# Patient Record
Sex: Male | Born: 1965 | Race: White | Hispanic: No | Smoking: Current every day smoker
Health system: Southern US, Community
[De-identification: ages and names within clinical notes are randomized; demographics above are authoritative.]

## PROBLEM LIST (undated history)

## (undated) DIAGNOSIS — I10 Essential (primary) hypertension: Secondary | ICD-10-CM

## (undated) DIAGNOSIS — E119 Type 2 diabetes mellitus without complications: Secondary | ICD-10-CM

## (undated) DIAGNOSIS — I509 Heart failure, unspecified: Secondary | ICD-10-CM

## (undated) DIAGNOSIS — J449 Chronic obstructive pulmonary disease, unspecified: Secondary | ICD-10-CM

## (undated) DIAGNOSIS — R0600 Dyspnea, unspecified: Secondary | ICD-10-CM

## (undated) HISTORY — PX: NO PAST SURGERIES: SHX2092

---

## 2005-04-28 ENCOUNTER — Ambulatory Visit: Payer: Self-pay | Admitting: Infectious Diseases

## 2005-04-28 ENCOUNTER — Inpatient Hospital Stay (HOSPITAL_COMMUNITY): Admission: EM | Admit: 2005-04-28 | Discharge: 2005-05-01 | Payer: Self-pay | Admitting: Emergency Medicine

## 2005-05-02 ENCOUNTER — Encounter (HOSPITAL_COMMUNITY): Admission: RE | Admit: 2005-05-02 | Discharge: 2005-06-14 | Payer: Self-pay | Admitting: Infectious Diseases

## 2005-05-23 ENCOUNTER — Ambulatory Visit: Payer: Self-pay | Admitting: Infectious Diseases

## 2005-05-25 ENCOUNTER — Ambulatory Visit (HOSPITAL_COMMUNITY): Admission: RE | Admit: 2005-05-25 | Discharge: 2005-05-25 | Payer: Self-pay | Admitting: Neurosurgery

## 2021-03-12 ENCOUNTER — Emergency Department (HOSPITAL_COMMUNITY): Payer: Medicaid Other

## 2021-03-12 ENCOUNTER — Other Ambulatory Visit: Payer: Self-pay

## 2021-03-12 ENCOUNTER — Encounter (HOSPITAL_COMMUNITY): Payer: Self-pay | Admitting: *Deleted

## 2021-03-12 ENCOUNTER — Inpatient Hospital Stay (HOSPITAL_COMMUNITY)
Admission: EM | Admit: 2021-03-12 | Discharge: 2021-03-18 | DRG: 871 | Disposition: A | Discharge status: Home / Self Care | Payer: Medicaid Other | Attending: Family Medicine | Admitting: Family Medicine

## 2021-03-12 DIAGNOSIS — T380X5A Adverse effect of glucocorticoids and synthetic analogues, initial encounter: Secondary | ICD-10-CM | POA: Diagnosis present

## 2021-03-12 DIAGNOSIS — I110 Hypertensive heart disease with heart failure: Secondary | ICD-10-CM | POA: Diagnosis present

## 2021-03-12 DIAGNOSIS — E11649 Type 2 diabetes mellitus with hypoglycemia without coma: Secondary | ICD-10-CM | POA: Diagnosis not present

## 2021-03-12 DIAGNOSIS — R7989 Other specified abnormal findings of blood chemistry: Secondary | ICD-10-CM | POA: Diagnosis present

## 2021-03-12 DIAGNOSIS — E119 Type 2 diabetes mellitus without complications: Secondary | ICD-10-CM

## 2021-03-12 DIAGNOSIS — Z880 Allergy status to penicillin: Secondary | ICD-10-CM

## 2021-03-12 DIAGNOSIS — J189 Pneumonia, unspecified organism: Secondary | ICD-10-CM

## 2021-03-12 DIAGNOSIS — F17210 Nicotine dependence, cigarettes, uncomplicated: Secondary | ICD-10-CM | POA: Diagnosis present

## 2021-03-12 DIAGNOSIS — Z79899 Other long term (current) drug therapy: Secondary | ICD-10-CM

## 2021-03-12 DIAGNOSIS — I10 Essential (primary) hypertension: Secondary | ICD-10-CM

## 2021-03-12 DIAGNOSIS — J440 Chronic obstructive pulmonary disease with acute lower respiratory infection: Secondary | ICD-10-CM | POA: Diagnosis present

## 2021-03-12 DIAGNOSIS — Z886 Allergy status to analgesic agent status: Secondary | ICD-10-CM

## 2021-03-12 DIAGNOSIS — N179 Acute kidney failure, unspecified: Secondary | ICD-10-CM | POA: Diagnosis present

## 2021-03-12 DIAGNOSIS — Z20822 Contact with and (suspected) exposure to covid-19: Secondary | ICD-10-CM | POA: Diagnosis present

## 2021-03-12 DIAGNOSIS — E1165 Type 2 diabetes mellitus with hyperglycemia: Secondary | ICD-10-CM | POA: Diagnosis present

## 2021-03-12 DIAGNOSIS — J9621 Acute and chronic respiratory failure with hypoxia: Secondary | ICD-10-CM | POA: Diagnosis present

## 2021-03-12 DIAGNOSIS — J441 Chronic obstructive pulmonary disease with (acute) exacerbation: Secondary | ICD-10-CM

## 2021-03-12 DIAGNOSIS — G4733 Obstructive sleep apnea (adult) (pediatric): Secondary | ICD-10-CM | POA: Diagnosis present

## 2021-03-12 DIAGNOSIS — R6520 Severe sepsis without septic shock: Secondary | ICD-10-CM | POA: Diagnosis present

## 2021-03-12 DIAGNOSIS — E8729 Other acidosis: Secondary | ICD-10-CM

## 2021-03-12 DIAGNOSIS — I5033 Acute on chronic diastolic (congestive) heart failure: Secondary | ICD-10-CM | POA: Diagnosis not present

## 2021-03-12 DIAGNOSIS — E872 Acidosis: Secondary | ICD-10-CM | POA: Diagnosis present

## 2021-03-12 DIAGNOSIS — E782 Mixed hyperlipidemia: Secondary | ICD-10-CM | POA: Diagnosis present

## 2021-03-12 DIAGNOSIS — J9691 Respiratory failure, unspecified with hypoxia: Secondary | ICD-10-CM

## 2021-03-12 DIAGNOSIS — J9622 Acute and chronic respiratory failure with hypercapnia: Secondary | ICD-10-CM | POA: Diagnosis present

## 2021-03-12 DIAGNOSIS — Z7984 Long term (current) use of oral hypoglycemic drugs: Secondary | ICD-10-CM

## 2021-03-12 DIAGNOSIS — R0602 Shortness of breath: Secondary | ICD-10-CM | POA: Diagnosis not present

## 2021-03-12 DIAGNOSIS — A419 Sepsis, unspecified organism: Secondary | ICD-10-CM | POA: Diagnosis present

## 2021-03-12 HISTORY — DX: Essential (primary) hypertension: I10

## 2021-03-12 HISTORY — DX: Chronic obstructive pulmonary disease, unspecified: J44.9

## 2021-03-12 LAB — CBC WITH DIFFERENTIAL/PLATELET
Abs Immature Granulocytes: 0.11 10*3/uL — ABNORMAL HIGH (ref 0.00–0.07)
Abs Immature Granulocytes: 0.1 10*3/uL — ABNORMAL HIGH (ref 0.00–0.07)
Basophils Absolute: 0.1 10*3/uL (ref 0.0–0.1)
Basophils Absolute: 0.1 10*3/uL (ref 0.0–0.1)
Basophils Relative: 1 %
Basophils Relative: 0 %
Eosinophils Absolute: 0 10*3/uL (ref 0.0–0.5)
Eosinophils Absolute: 0 10*3/uL (ref 0.0–0.5)
Eosinophils Relative: 0 %
Eosinophils Relative: 0 %
HCT: 59 % — ABNORMAL HIGH (ref 39.0–52.0)
HCT: 59.5 % — ABNORMAL HIGH (ref 39.0–52.0)
Hemoglobin: 19.3 g/dL — ABNORMAL HIGH (ref 13.0–17.0)
Hemoglobin: 18.9 g/dL — ABNORMAL HIGH (ref 13.0–17.0)
Immature Granulocytes: 1 %
Immature Granulocytes: 1 %
Lymphocytes Relative: 4 %
Lymphocytes Relative: 7 %
Lymphs Abs: 0.7 10*3/uL (ref 0.7–4.0)
Lymphs Abs: 1.1 10*3/uL (ref 0.7–4.0)
MCH: 32.4 pg (ref 26.0–34.0)
MCH: 32.1 pg (ref 26.0–34.0)
MCHC: 32.7 g/dL (ref 30.0–36.0)
MCHC: 31.8 g/dL (ref 30.0–36.0)
MCV: 99.2 fL (ref 80.0–100.0)
MCV: 101 fL — ABNORMAL HIGH (ref 80.0–100.0)
Monocytes Absolute: 1.9 10*3/uL — ABNORMAL HIGH (ref 0.1–1.0)
Monocytes Absolute: 1.7 10*3/uL — ABNORMAL HIGH (ref 0.1–1.0)
Monocytes Relative: 11 %
Monocytes Relative: 11 %
Neutro Abs: 13.8 10*3/uL — ABNORMAL HIGH (ref 1.7–7.7)
Neutro Abs: 12.8 10*3/uL — ABNORMAL HIGH (ref 1.7–7.7)
Neutrophils Relative %: 83 %
Neutrophils Relative %: 81 %
Platelets: 151 10*3/uL (ref 150–400)
Platelets: 148 10*3/uL — ABNORMAL LOW (ref 150–400)
RBC: 5.95 MIL/uL — ABNORMAL HIGH (ref 4.22–5.81)
RBC: 5.89 MIL/uL — ABNORMAL HIGH (ref 4.22–5.81)
RDW: 18.6 % — ABNORMAL HIGH (ref 11.5–15.5)
RDW: 18.7 % — ABNORMAL HIGH (ref 11.5–15.5)
WBC: 16.6 10*3/uL — ABNORMAL HIGH (ref 4.0–10.5)
WBC: 15.7 10*3/uL — ABNORMAL HIGH (ref 4.0–10.5)
nRBC: 0 % (ref 0.0–0.2)
nRBC: 0 % (ref 0.0–0.2)

## 2021-03-12 LAB — URINALYSIS, ROUTINE W REFLEX MICROSCOPIC
Bacteria, UA: NONE SEEN
Bilirubin Urine: NEGATIVE
Glucose, UA: NEGATIVE mg/dL
Hgb urine dipstick: NEGATIVE
Ketones, ur: NEGATIVE mg/dL
Leukocytes,Ua: NEGATIVE
Nitrite: NEGATIVE
Protein, ur: 100 mg/dL — AB
Specific Gravity, Urine: 1.024 (ref 1.005–1.030)
pH: 5 (ref 5.0–8.0)

## 2021-03-12 LAB — RESP PANEL BY RT-PCR (FLU A&B, COVID) ARPGX2
Influenza A by PCR: NEGATIVE
Influenza B by PCR: NEGATIVE
SARS Coronavirus 2 by RT PCR: NEGATIVE

## 2021-03-12 LAB — COMPREHENSIVE METABOLIC PANEL
ALT: 15 U/L (ref 0–44)
AST: 14 U/L — ABNORMAL LOW (ref 15–41)
Albumin: 4.3 g/dL (ref 3.5–5.0)
Alkaline Phosphatase: 58 U/L (ref 38–126)
Anion gap: 12 (ref 5–15)
BUN: 54 mg/dL — ABNORMAL HIGH (ref 6–20)
CO2: 27 mmol/L (ref 22–32)
Calcium: 9.2 mg/dL (ref 8.9–10.3)
Chloride: 95 mmol/L — ABNORMAL LOW (ref 98–111)
Creatinine, Ser: 1.83 mg/dL — ABNORMAL HIGH (ref 0.61–1.24)
GFR, Estimated: 43 mL/min — ABNORMAL LOW (ref >60)
Glucose, Bld: 126 mg/dL — ABNORMAL HIGH (ref 70–99)
Potassium: 4.3 mmol/L (ref 3.5–5.1)
Sodium: 134 mmol/L — ABNORMAL LOW (ref 135–145)
Total Bilirubin: 2.4 mg/dL — ABNORMAL HIGH (ref 0.3–1.2)
Total Protein: 8.2 g/dL — ABNORMAL HIGH (ref 6.5–8.1)

## 2021-03-12 LAB — BLOOD GAS, ARTERIAL
Acid-base deficit: 2 mmol/L (ref 0.0–2.0)
Allens test (pass/fail): POSITIVE — AB
Bicarbonate: 28.6 mmol/L — ABNORMAL HIGH (ref 20.0–28.0)
O2 Saturation: 88 %
Patient temperature: 98.6
pCO2 arterial: 72.5 mmHg (ref 32.0–48.0)
pH, Arterial: 7.22 — ABNORMAL LOW (ref 7.350–7.450)
pO2, Arterial: 62.7 mmHg — ABNORMAL LOW (ref 83.0–108.0)

## 2021-03-12 LAB — PROTIME-INR
INR: 1.2 (ref 0.8–1.2)
Prothrombin Time: 15 seconds (ref 11.4–15.2)

## 2021-03-12 LAB — BRAIN NATRIURETIC PEPTIDE: B Natriuretic Peptide: 132.9 pg/mL — ABNORMAL HIGH (ref 0.0–100.0)

## 2021-03-12 LAB — APTT: aPTT: 31 seconds (ref 24–36)

## 2021-03-12 LAB — LACTIC ACID, PLASMA: Lactic Acid, Venous: 1 mmol/L (ref 0.5–1.9)

## 2021-03-12 LAB — D-DIMER, QUANTITATIVE: D-Dimer, Quant: 0.97 ug/mL-FEU — ABNORMAL HIGH (ref 0.00–0.50)

## 2021-03-12 MED ORDER — ONDANSETRON HCL 4 MG/2ML IJ SOLN: 4.0000 mg | Freq: Four times a day (QID) | INTRAMUSCULAR | Status: DC | PRN

## 2021-03-12 MED ORDER — IPRATROPIUM-ALBUTEROL 0.5-2.5 (3) MG/3ML IN SOLN
3.0000 mL | Freq: Once | RESPIRATORY_TRACT | Status: AC
  Administered 2021-03-12: 3 mL via RESPIRATORY_TRACT
  Filled 2021-03-12: qty 3

## 2021-03-12 MED ORDER — SODIUM CHLORIDE 0.9 % IV SOLN: INTRAVENOUS | Status: DC

## 2021-03-12 MED ORDER — ENOXAPARIN SODIUM 40 MG/0.4ML IJ SOSY
40.0000 mg | PREFILLED_SYRINGE | INTRAMUSCULAR | Status: DC
  Administered 2021-03-12: 40 mg via SUBCUTANEOUS
  Filled 2021-03-12: qty 0.4

## 2021-03-12 MED ORDER — TRAZODONE HCL 50 MG PO TABS
50.0000 mg | ORAL_TABLET | Freq: Once | ORAL | Status: AC
  Administered 2021-03-12: 50 mg via ORAL
  Filled 2021-03-12: qty 1

## 2021-03-12 MED ORDER — ONDANSETRON HCL 4 MG PO TABS: 4.0000 mg | ORAL_TABLET | Freq: Four times a day (QID) | ORAL | Status: DC | PRN

## 2021-03-12 MED ORDER — ACETAMINOPHEN 325 MG PO TABS: 650.0000 mg | ORAL_TABLET | Freq: Four times a day (QID) | ORAL | Status: DC | PRN

## 2021-03-12 MED ORDER — SODIUM CHLORIDE 0.9 % IV SOLN
1.0000 g | INTRAVENOUS | Status: DC
  Administered 2021-03-12: 1 g via INTRAVENOUS
  Filled 2021-03-12: qty 10

## 2021-03-12 MED ORDER — SODIUM CHLORIDE 0.9 % IV SOLN
2.0000 g | INTRAVENOUS | Status: AC
  Administered 2021-03-13 – 2021-03-16 (×4): 2 g via INTRAVENOUS
  Filled 2021-03-12 (×4): qty 2

## 2021-03-12 MED ORDER — IPRATROPIUM BROMIDE HFA 17 MCG/ACT IN AERS
2.0000 | INHALATION_SPRAY | Freq: Once | RESPIRATORY_TRACT | Status: AC
  Administered 2021-03-12: 2 via RESPIRATORY_TRACT
  Filled 2021-03-12: qty 12.9

## 2021-03-12 MED ORDER — ATORVASTATIN CALCIUM 20 MG PO TABS
20.0000 mg | ORAL_TABLET | Freq: Every day | ORAL | Status: DC
  Administered 2021-03-13 – 2021-03-18 (×6): 20 mg via ORAL
  Filled 2021-03-12 (×6): qty 1

## 2021-03-12 MED ORDER — LACTATED RINGERS IV SOLN: INTRAVENOUS | Status: DC

## 2021-03-12 MED ORDER — GUAIFENESIN ER 600 MG PO TB12
600.0000 mg | ORAL_TABLET | Freq: Two times a day (BID) | ORAL | Status: DC
  Administered 2021-03-12 – 2021-03-15 (×6): 600 mg via ORAL
  Filled 2021-03-12 (×6): qty 1

## 2021-03-12 MED ORDER — SODIUM CHLORIDE 0.9 % IV SOLN
500.0000 mg | INTRAVENOUS | Status: DC
  Administered 2021-03-12: 500 mg via INTRAVENOUS
  Filled 2021-03-12: qty 500

## 2021-03-12 MED ORDER — ALBUTEROL SULFATE HFA 108 (90 BASE) MCG/ACT IN AERS
4.0000 | INHALATION_SPRAY | Freq: Once | RESPIRATORY_TRACT | Status: AC
  Administered 2021-03-12: 4 via RESPIRATORY_TRACT
  Filled 2021-03-12: qty 6.7

## 2021-03-12 MED ORDER — ARFORMOTEROL TARTRATE 15 MCG/2ML IN NEBU
15.0000 ug | INHALATION_SOLUTION | Freq: Two times a day (BID) | RESPIRATORY_TRACT | Status: DC
  Administered 2021-03-13 – 2021-03-18 (×11): 15 ug via RESPIRATORY_TRACT
  Filled 2021-03-12 (×11): qty 2

## 2021-03-12 MED ORDER — UMECLIDINIUM BROMIDE 62.5 MCG/INH IN AEPB
1.0000 | INHALATION_SPRAY | Freq: Every day | RESPIRATORY_TRACT | Status: DC
  Administered 2021-03-13 – 2021-03-18 (×6): 1 via RESPIRATORY_TRACT
  Filled 2021-03-12: qty 7

## 2021-03-12 MED ORDER — SODIUM CHLORIDE 0.9 % IV SOLN
500.0000 mg | INTRAVENOUS | Status: AC
  Administered 2021-03-13 – 2021-03-16 (×4): 500 mg via INTRAVENOUS
  Filled 2021-03-12 (×4): qty 500

## 2021-03-12 MED ORDER — ACETAMINOPHEN 650 MG RE SUPP: 650.0000 mg | Freq: Four times a day (QID) | RECTAL | Status: DC | PRN

## 2021-03-12 MED ORDER — ALBUTEROL SULFATE (2.5 MG/3ML) 0.083% IN NEBU
2.5000 mg | INHALATION_SOLUTION | Freq: Four times a day (QID) | RESPIRATORY_TRACT | Status: DC | PRN
  Administered 2021-03-12: 2.5 mg via RESPIRATORY_TRACT
  Filled 2021-03-12: qty 3

## 2021-03-12 NOTE — ED Provider Notes (Addendum)
[facility] DEPT Provider Note   CSN: [id number] Arrival date & time: 03/12/21  1246     History Chief Complaint  Patient presents with   Shortness of Breath    Maxwell Grant is a 55 y.o. male.  55 year old male presents with increased shortness of breath.  History of COPD.  States he has had severe dyspnea on exertion recently.  Body aches and chills.  No loss of taste or smell.  Cough nonproductive.  Went to urgent care and was found to be severely hypoxemic.  Given oxygen and transported here.      Past Medical History:  Diagnosis Date   COPD (chronic obstructive pulmonary disease) ([place])    Hypertension     There are no problems to display for this patient.   History reviewed. No pertinent surgical history.     No family history on file.  Social History   Tobacco Use   Smoking status: Every Day    Packs/day: 1.00    Pack years: 0.00    Types: Cigarettes   Smokeless tobacco: Never  Vaping Use   Vaping Use: Never used  Substance Use Topics   Alcohol use: Yes    Alcohol/week: 2.0 standard drinks    Types: 2 Standard drinks or equivalent per week   Drug use: Never    Home Medications Prior to Admission medications   Not on File    Allergies    Aspirin and Penicillins  Review of Systems   Review of Systems  All other systems reviewed and are negative.  Physical Exam Updated Vital Signs Pulse (!) 108   Temp 99.4 F (37.4 C) (Oral)   Resp (!) 27   Ht 1.702 m (5\' 7" )   Wt 92.1 kg   SpO2 97%   BMI 31.79 kg/m   Physical Exam Vitals and nursing note reviewed.  Constitutional:      General: He is not in acute distress.    Appearance: Normal appearance. He is well-developed. He is not toxic-appearing.  HENT:     Head: Normocephalic and atraumatic.  Eyes:     General: Lids are normal.     Conjunctiva/sclera: Conjunctivae normal.     Pupils: Pupils are equal, round, and reactive to light.  Neck:     Thyroid:  No thyroid mass.     Trachea: No tracheal deviation.  Cardiovascular:     Rate and Rhythm: Normal rate and regular rhythm.     Heart sounds: Normal heart sounds. No murmur heard.   No gallop.  Pulmonary:     Effort: Pulmonary effort is normal. No respiratory distress.     Breath sounds: Normal breath sounds. No stridor. No decreased breath sounds, wheezing, rhonchi or rales.  Abdominal:     General: There is no distension.     Palpations: Abdomen is soft.     Tenderness: There is no abdominal tenderness. There is no rebound.  Musculoskeletal:        General: No tenderness. Normal range of motion.     Cervical back: Normal range of motion and neck supple.  Skin:    General: Skin is warm and dry.     Findings: No abrasion or rash.  Neurological:     Mental Status: He is alert and oriented to person, place, and time. Mental status is at baseline.     GCS: GCS eye subscore is 4. GCS verbal subscore is 5. GCS motor subscore is 6.     Cranial  Nerves: Cranial nerves are intact. No cranial nerve deficit.     Sensory: No sensory deficit.     Motor: Motor function is intact.  Psychiatric:        Attention and Perception: Attention normal.        Speech: Speech normal.        Behavior: Behavior normal.    ED Results / Procedures / Treatments   Labs (all labs ordered are listed, but only abnormal results are displayed) Labs Reviewed  RESP PANEL BY RT-PCR (FLU A&B, COVID) [id number]  CULTURE, BLOOD (SINGLE)  URINE CULTURE  CULTURE, BLOOD (ROUTINE X 2)  CULTURE, BLOOD (ROUTINE X 2)  LACTIC ACID, PLASMA  LACTIC ACID, PLASMA  COMPREHENSIVE METABOLIC PANEL  CBC WITH DIFFERENTIAL/PLATELET  PROTIME-INR  APTT  URINALYSIS, ROUTINE W REFLEX MICROSCOPIC    EKG EKG Interpretation  Date/Time:  Saturday March 12 2021 13:16:33 EDT Ventricular Rate:  118 PR Interval:  169 QRS Duration: 87 QT Interval:  302 QTC Calculation: 424 R Axis:   126 Text Interpretation: Sinus tachycardia Ventricular  trigeminy Left posterior fascicular block Borderline low voltage, extremity leads Confirmed by Lacretia Leigh ([id number]) on 03/12/2021 2:46:20 PM  Radiology No results found.  Procedures Procedures   Medications Ordered in ED Medications  lactated ringers infusion (has no administration in time range)  albuterol (VENTOLIN HFA) 108 (90 Base) MCG/ACT inhaler 4 puff (has no administration in time range)  ipratropium (ATROVENT HFA) inhaler 2 puff (has no administration in time range)    ED Course  I have reviewed the triage vital signs and the nursing notes.  Pertinent labs & imaging results that were available during my care of the patient were reviewed by me and considered in my medical decision making (see chart for details).    MDM Rules/Calculators/A&P                        Patient given albuterol and Atrovent inhalers due to his wheezing and known history of COPD. Chest x-ray results noted.  Suspicious for pulmonary edema however patient symptoms likely from pneumonia as he has fever and cough.  Has leukocytosis as well as evidence of acute kidney injury with creatinine of 1.83.  Will be given IV fluids and started on IV antibiotics.  COVID test is pending at this time.  Due to his hypoxia, he will require hospitalization  CRITICAL CARE Performed by: Leota Jacobsen Total critical care time: 60 minutes Critical care time was exclusive of separately billable procedures and treating other patients. Critical care was necessary to treat or prevent imminent or life-threatening deterioration. Critical care was time spent personally by me on the following activities: development of treatment plan with patient and/or surrogate as well as nursing, discussions with consultants, evaluation of patient's response to treatment, examination of patient, obtaining history from patient or surrogate, ordering and performing treatments and interventions, ordering and review of laboratory studies, ordering  and review of radiographic studies, pulse oximetry and re-evaluation of patient's condition.  Final Clinical Impression(s) / ED Diagnoses Final diagnoses:  None    Rx / DC Orders ED Discharge Orders     None        Lacretia Leigh, MD 03/12/21 1514    Lacretia Leigh, MD 03/12/21 1515

## 2021-03-12 NOTE — H&P (Addendum)
History and Physical    Maxwell Grant CXK:[id number] DOB: [birth year] DOA: 03/12/2021  PCP: Pcp, No  Patient coming from: Home  Chief Complaint: dyspnea  HPI: Maxwell Grant is a 55 y.o. male with medical history significant of HLD, COPD, HTN. Presenting with dyspnea. His symptoms started 3 days ago. He felt it difficult to breathe. He thought it might be some allergies. So he tried some allegra, but it didn't help. He has non-productive cough as well. He tried his inhalers but they didn't work. His symptoms worsened through this morning, so he came to the ED for help. Of note, his wife was sick with bronchitis and a sinus infection this week. He denies any other aggravating or alleviating factors.   ED Course: CXR was concerning for PNA. EDP started CAP coverage. He was noted to have an AKI. He was given fluids. TRH was called for admission.   Review of Systems: Review of systems is otherwise negative for all not mentioned in HPI.   PMHx Past Medical History:  Diagnosis Date   COPD (chronic obstructive pulmonary disease) ([place])    Hypertension     PSHx History reviewed. No pertinent surgical history.  SocHx  reports that he has been smoking cigarettes. He has been smoking an average of 1.00 packs per day. He has never used smokeless tobacco. He reports current alcohol use of about 2.0 standard drinks of alcohol per week. He reports that he does not use drugs.  Allergies  Allergen Reactions   Aspirin Diarrhea and Nausea And Vomiting   Penicillins Diarrhea and Nausea And Vomiting    FamHx No family history on file.  Prior to Admission medications   Medication Sig Start Date End Date Taking? Authorizing Provider  albuterol (VENTOLIN HFA) 108 (90 Base) MCG/ACT inhaler Inhale 1-2 puffs into the lungs every 4 (four) hours as needed for wheezing or shortness of breath. 03/30/17  Yes [provider]  amLODipine (NORVASC) 10 MG tablet Take 10 mg by mouth daily. 01/09/21  Yes  [provider]  atorvastatin (LIPITOR) 20 MG tablet Take 20 mg by mouth daily. 03/28/16  Yes [provider]  chlorthalidone (HYGROTON) 25 MG tablet Take 25 mg by mouth daily. 12/14/20  Yes [provider]  glimepiride (AMARYL) 2 MG tablet Take 2 mg by mouth daily. 02/26/21  Yes [provider]  lisinopril (ZESTRIL) 30 MG tablet Take 30 mg by mouth daily. 02/15/21  Yes [provider]  Tiotropium Bromide-Olodaterol 2.5-2.5 MCG/ACT AERS Inhale 1 puff into the lungs daily. 05/10/20  Yes [provider]    Physical Exam: Vitals:   03/12/21 1418 03/12/21 1451 03/12/21 1500 03/12/21 1530  BP: 106/67 101/67 103/66 128/78  Pulse: (!) 113 (!) 109 (!) 109 (!) 124  Resp: 20 (!) 26 (!) 31 (!) 27  Temp:      TempSrc:      SpO2: 93% 95% 93% 96%  Weight:  92.1 kg    Height:  5\' 7"  (1.702 m)      General: 55 y.o. male resting in bed in NAD Eyes: PERRL, normal sclera ENMT: Nares patent w/o discharge, orophaynx clear, dentition normal, ears w/o discharge/lesions/ulcers Neck: Supple, trachea midline Cardiovascular: tachy, +S1, S2, no m/g/r, equal pulses throughout Respiratory: decreased at bases, soft rhonchi/upper air way transmission, no wheeze, increased WOB GI: BS+, NDNT, no masses noted, no organomegaly noted MSK: No e/c/c Skin: No rashes, bruises, ulcerations noted Neuro: A&O x 3, no focal deficits Psyc: Appropriate interaction and affect, calm/cooperative  Labs on Admission: I have personally reviewed following labs and imaging studies  CBC: Recent Labs  Lab 03/12/21 1412  WBC 16.6*  NEUTROABS 13.8*  HGB 19.3*  HCT 59.0*  MCV 99.2  PLT 416   Basic Metabolic Panel: Recent Labs  Lab 03/12/21 1412  NA 134*  K 4.3  CL 95*  CO2 27  GLUCOSE 126*  BUN 54*  CREATININE 1.83*  CALCIUM 9.2   GFR: Estimated Creatinine Clearance: 48.2 mL/min (A) (by C-G formula based on SCr of 1.83 mg/dL (H)). Liver Function Tests: Recent Labs   Lab 03/12/21 1412  AST 14*  ALT 15  ALKPHOS 58  BILITOT 2.4*  PROT 8.2*  ALBUMIN 4.3   No results for input(s): LIPASE, AMYLASE in the last 168 hours. No results for input(s): AMMONIA in the last 168 hours. Coagulation Profile: Recent Labs  Lab 03/12/21 1412  INR 1.2   Cardiac Enzymes: No results for input(s): CKTOTAL, CKMB, CKMBINDEX, TROPONINI in the last 168 hours. BNP (last 3 results) No results for input(s): PROBNP in the last 8760 hours. HbA1C: No results for input(s): HGBA1C in the last 72 hours. CBG: No results for input(s): GLUCAP in the last 168 hours. Lipid Profile: No results for input(s): CHOL, HDL, LDLCALC, TRIG, CHOLHDL, LDLDIRECT in the last 72 hours. Thyroid Function Tests: No results for input(s): TSH, T4TOTAL, FREET4, T3FREE, THYROIDAB in the last 72 hours. Anemia Panel: No results for input(s): VITAMINB12, FOLATE, FERRITIN, TIBC, IRON, RETICCTPCT in the last 72 hours. Urine analysis: No results found for: COLORURINE, APPEARANCEUR, LABSPEC, PHURINE, GLUCOSEU, HGBUR, BILIRUBINUR, KETONESUR, PROTEINUR, UROBILINOGEN, NITRITE, LEUKOCYTESUR  Radiological Exams on Admission: DG Chest Port 1 View  Result Date: 03/12/2021 CLINICAL DATA:  55 year old male with a history of cough EXAM: PORTABLE CHEST 1 VIEW COMPARISON:  05/01/2005 FINDINGS: Cardiomediastinal silhouette unchanged in size and contour. Prominence of the left vascular pedicle is similar to the comparison plain film. Interlobular septal thickening of the bilateral lungs. No pneumothorax. Blunting of the bilateral costophrenic angles. No displaced fracture IMPRESSION: Interlobular septal thickening with trace bilateral pleural effusions suggesting pulmonary edema. Atypical infection cannot be excluded. Electronically Signed   By: Corrie Mckusick D.O.   On: 03/12/2021 14:24    Assessment/Plan Dyspnea PNA Hx of COPD Sepsis secondary to above     - admit to inpt, tele     - COVID negative     - started  on rocephin, zithro for PNA     - check procal     - guafenesin, nebs, O2 support as needed, IS, home inhalers     - he's tachy and hypoxic; so let's check a d-dimer, if elevated, send for V/Q     - no wheeze on exam, but does have cough and upper airway transmission  AKI     - give fluids, check renal US  Polycythemia?     - Hgb of 19 back in feb per care everywhere; I don't see any follow up on this     - Hgb is 19 again today     - he is a smoker and has COPD     - check peripheral smear; may eventually need hematology consult  Hx of HTN     - he's on the lower side of normotensive right now, hold home meds  DM2     - SSI, A1c, glucose checks, DM diet  HLD     - statin  Tobacco abuse     - counsel against further  use  DVT prophylaxis: lovenox  Code Status: FULL  Family Communication: None at bedside  Consults called: None   Status is: Inpatient  Remains inpatient appropriate because:Inpatient level of care appropriate due to severity of illness  Dispo: The patient is from: Home              Anticipated d/c is to: Home              Patient currently is not medically stable to d/c.   Difficult to place patient No  Time spent coordinating admission: 45 minutes  [facility]  If 7PM-7AM, please contact night-coverage [email or web address]  03/12/2021, 3:44 PM

## 2021-03-12 NOTE — ED Triage Notes (Signed)
Pt BIB EMS and presents SOB since Wednesday.  Pt was picked up at Urgent Care by EMS.  Pt initial O2 sat was 57% and UC put 2L [place] on pt.  On EMS arrival, EMS placed on 6L [place] and brought pt's O2 to 93%. On arrival to ED pt noted to be exertional with movement. Pt has a productive cough. Pt's wife is also sick but was dx with bronchitis. Pt a/o x 4 and ambulatory.

## 2021-03-12 NOTE — ED Notes (Signed)
ED TO INPATIENT HANDOFF REPORT  Name/Age/Gender Maxwell Grant 55 y.o. male  Code Status Advance Directive Documentation   Flowsheet Row Most Recent Value  Type of Advance Directive Healthcare Power of Attorney  Pre-existing out of facility DNR order (yellow form or pink MOST form) --  "MOST" Form in Place? --      Home/SNF/Other Home  Chief Complaint Pneumonia [J18.9]  Level of Care/Admitting Diagnosis ED Disposition    ED Disposition  Admit   Condition  --   [place]: [facility] [[id number]]  Level of Care: Telemetry [5]  Admit to tele based on following criteria: Monitor for Ischemic changes  May admit patient to [facility] or [facility] if equivalent level of care is available:: No  Covid Evaluation: Confirmed COVID Negative  Diagnosis: Pneumonia [227785]  Admitting Physician: Jonnie Finner [[id number]]  Attending Physician: Jonnie Finner [[id number]]  Estimated length of stay: past midnight tomorrow  Certification:: I certify this patient will need inpatient services for at least 2 midnights         Medical History Past Medical History:  Diagnosis Date  . COPD (chronic obstructive pulmonary disease) ([place])   . Hypertension     Allergies Allergies  Allergen Reactions  . Aspirin Diarrhea and Nausea And Vomiting  . Penicillins Diarrhea and Nausea And Vomiting    IV Location/Drains/Wounds Patient Lines/Drains/Airways Status    Active Line/Drains/Airways    Name Placement date Placement time Site Days   Peripheral IV 03/12/21 20 G Right;Anterior Forearm 03/12/21  1410  Forearm  less than 1   Peripheral IV 03/12/21 20 G 1" Right;Posterior Hand 03/12/21  1519  Hand  less than 1          Labs/Imaging Results for orders placed or performed during the hospital encounter of 03/12/21 (from the past 48 hour(s))  Resp Panel by RT-PCR (Flu A&B, Covid) Nasopharyngeal Swab     Status: None   Collection Time: 03/12/21  2:11 PM    Specimen: Nasopharyngeal Swab; Nasopharyngeal(NP) swabs in vial transport medium  Result Value Ref Range   SARS Coronavirus 2 by RT PCR NEGATIVE NEGATIVE    Comment: (NOTE) SARS-CoV-2 target nucleic acids are NOT DETECTED.  The SARS-CoV-2 RNA is generally detectable in upper respiratory specimens during the acute phase of infection. The lowest concentration of SARS-CoV-2 viral copies this assay can detect is 138 copies/mL. A negative result does not preclude SARS-Cov-2 infection and should not be used as the sole basis for treatment or other patient management decisions. A negative result may occur with  improper specimen collection/handling, submission of specimen other than nasopharyngeal swab, presence of viral mutation(s) within the areas targeted by this assay, and inadequate number of viral copies(<138 copies/mL). A negative result must be combined with clinical observations, patient history, and epidemiological information. The expected result is Negative.  Fact Sheet for Patients:  EntrepreneurPulse.com.au  Fact Sheet for Healthcare Providers:  IncredibleEmployment.be  This test is no t yet approved or cleared by the [place] FDA and  has been authorized for detection and/or diagnosis of SARS-CoV-2 by FDA under an Emergency Use Authorization (EUA). This EUA will remain  in effect (meaning this test can be used) for the duration of the COVID-19 declaration under Section 564(b)(1) of the Act, 21 U.S.C.section 360bbb-3(b)(1), unless the authorization is terminated  or revoked sooner.       Influenza A by PCR NEGATIVE NEGATIVE   Influenza B by PCR NEGATIVE NEGATIVE  Comment: (NOTE) The Xpert Xpress SARS-CoV-2/FLU/RSV plus assay is intended as an aid in the diagnosis of influenza from Nasopharyngeal swab specimens and should not be used as a sole basis for treatment. Nasal washings and aspirates are unacceptable for Xpert Xpress  SARS-CoV-2/FLU/RSV testing.  Fact Sheet for Patients: EntrepreneurPulse.com.au  Fact Sheet for Healthcare Providers: IncredibleEmployment.be  This test is not yet approved or cleared by the [place] FDA and has been authorized for detection and/or diagnosis of SARS-CoV-2 by FDA under an Emergency Use Authorization (EUA). This EUA will remain in effect (meaning this test can be used) for the duration of the COVID-19 declaration under Section 564(b)(1) of the Act, 21 U.S.C. section 360bbb-3(b)(1), unless the authorization is terminated or revoked.  Performed at [facility], [place] [address] [place], [place] [address]   Lactic acid, plasma     Status: None   Collection Time: 03/12/21  2:11 PM  Result Value Ref Range   Lactic Acid, Venous 1.0 0.5 - 1.9 mmol/L    Comment: Performed at [facility], [place] [address] [place], [place] [address]  Comprehensive metabolic panel     Status: Abnormal   Collection Time: 03/12/21  2:12 PM  Result Value Ref Range   Sodium 134 (L) 135 - 145 mmol/L   Potassium 4.3 3.5 - 5.1 mmol/L   Chloride 95 (L) 98 - 111 mmol/L   CO2 27 22 - 32 mmol/L   Glucose, Bld 126 (H) 70 - 99 mg/dL    Comment: Glucose reference range applies only to samples taken after fasting for at least 8 hours.   BUN 54 (H) 6 - 20 mg/dL   Creatinine, Ser 1.83 (H) 0.61 - 1.24 mg/dL   Calcium 9.2 8.9 - 10.3 mg/dL   Total Protein 8.2 (H) 6.5 - 8.1 g/dL   Albumin 4.3 3.5 - 5.0 g/dL   AST 14 (L) 15 - 41 U/L   ALT 15 0 - 44 U/L   Alkaline Phosphatase 58 38 - 126 U/L   Total Bilirubin 2.4 (H) 0.3 - 1.2 mg/dL   GFR, Estimated 43 (L) >60 mL/min    Comment: (NOTE) Calculated using the CKD-EPI Creatinine Equation (2021)    Anion gap 12 5 - 15    Comment: Performed at [facility], [place] [address], [place], [place] [address]  CBC WITH DIFFERENTIAL     Status: Abnormal   Collection Time:  03/12/21  2:12 PM  Result Value Ref Range   WBC 16.6 (H) 4.0 - 10.5 K/uL   RBC 5.95 (H) 4.22 - 5.81 MIL/uL   Hemoglobin 19.3 (H) 13.0 - 17.0 g/dL   HCT 59.0 (H) 39.0 - 52.0 %   MCV 99.2 80.0 - 100.0 fL   MCH 32.4 26.0 - 34.0 pg   MCHC 32.7 30.0 - 36.0 g/dL   RDW 18.6 (H) 11.5 - 15.5 %   Platelets 151 150 - 400 K/uL   nRBC 0.0 0.0 - 0.2 %   Neutrophils Relative % 83 %   Neutro Abs 13.8 (H) 1.7 - 7.7 K/uL   Lymphocytes Relative 4 %   Lymphs Abs 0.7 0.7 - 4.0 K/uL   Monocytes Relative 11 %   Monocytes Absolute 1.9 (H) 0.1 - 1.0 K/uL   Eosinophils Relative 0 %   Eosinophils Absolute 0.0 0.0 - 0.5 K/uL   Basophils Relative 1 %   Basophils Absolute 0.1 0.0 - 0.1 K/uL   Immature Granulocytes 1 %   Abs  Immature Granulocytes 0.11 (H) 0.00 - 0.07 K/uL    Comment: Performed at [facility], [place] [address], [place], [place] [address]  Protime-INR     Status: None   Collection Time: 03/12/21  2:12 PM  Result Value Ref Range   Prothrombin Time 15.0 11.4 - 15.2 seconds   INR 1.2 0.8 - 1.2    Comment: (NOTE) INR goal varies based on device and disease states. Performed at [facility], [place] [address], [place], [place] [address]   APTT     Status: None   Collection Time: 03/12/21  2:12 PM  Result Value Ref Range   aPTT 31 24 - 36 seconds    Comment: Performed at [facility], [place] [address] [place], [place] [address]   DG Chest Port 1 View  Result Date: 03/12/2021 CLINICAL DATA:  55 year old male with a history of cough EXAM: PORTABLE CHEST 1 VIEW COMPARISON:  05/01/2005 FINDINGS: Cardiomediastinal silhouette unchanged in size and contour. Prominence of the left vascular pedicle is similar to the comparison plain film. Interlobular septal thickening of the bilateral lungs. No pneumothorax. Blunting of the bilateral costophrenic angles. No displaced fracture IMPRESSION: Interlobular septal thickening with trace bilateral pleural  effusions suggesting pulmonary edema. Atypical infection cannot be excluded. Electronically Signed   By: Corrie Mckusick D.O.   On: 03/12/2021 14:24    Pending Labs Unresulted Labs (From admission, onward)    Start     Ordered   03/12/21 1453  Brain natriuretic peptide  Once,   STAT        03/12/21 1453   03/12/21 1322  Urinalysis, Routine w reflex microscopic  (Undifferentiated presentation (screening labs and basic nursing orders))  ONCE - STAT,   STAT        03/12/21 1321   03/12/21 1322  Urine culture  (Undifferentiated presentation (screening labs and basic nursing orders))  ONCE - STAT,   STAT        03/12/21 1321   03/12/21 1322  Blood Culture (routine x 2)  (Undifferentiated presentation (screening labs and basic nursing orders))  BLOOD CULTURE X 2,   STAT      03/12/21 1321          Vitals/Pain Today's Vitals   03/12/21 1418 03/12/21 1451 03/12/21 1500 03/12/21 1530  BP: 106/67 101/67 103/66 128/78  Pulse: (!) 113 (!) 109 (!) 109 (!) 124  Resp: 20 (!) 26 (!) 31 (!) 27  Temp:      TempSrc:      SpO2: 93% 95% 93% 96%  Weight:  92.1 kg    Height:  5\' 7"  (1.702 m)    PainSc:        Isolation Precautions Airborne and Contact precautions  Medications Medications  lactated ringers infusion ( Intravenous New Bag/Given 03/12/21 1425)  azithromycin (ZITHROMAX) 500 mg in sodium chloride 0.9 % 250 mL IVPB (500 mg Intravenous New Bag/Given 03/12/21 1523)  cefTRIAXone (ROCEPHIN) 1 g in sodium chloride 0.9 % 100 mL IVPB (1 g Intravenous New Bag/Given (Non-Interop) 03/12/21 1524)  albuterol (VENTOLIN HFA) 108 (90 Base) MCG/ACT inhaler 4 puff (4 puffs Inhalation Given 03/12/21 1356)  ipratropium (ATROVENT HFA) inhaler 2 puff (2 puffs Inhalation Given 03/12/21 1356)    Mobility walks

## 2021-03-13 ENCOUNTER — Inpatient Hospital Stay (HOSPITAL_COMMUNITY): Payer: Medicaid Other

## 2021-03-13 DIAGNOSIS — R0602 Shortness of breath: Secondary | ICD-10-CM

## 2021-03-13 DIAGNOSIS — I5033 Acute on chronic diastolic (congestive) heart failure: Secondary | ICD-10-CM

## 2021-03-13 LAB — CBC
HCT: 55.9 % — ABNORMAL HIGH (ref 39.0–52.0)
Hemoglobin: 17.1 g/dL — ABNORMAL HIGH (ref 13.0–17.0)
MCH: 31.6 pg (ref 26.0–34.0)
MCHC: 30.6 g/dL (ref 30.0–36.0)
MCV: 103.3 fL — ABNORMAL HIGH (ref 80.0–100.0)
Platelets: 137 10*3/uL — ABNORMAL LOW (ref 150–400)
RBC: 5.41 MIL/uL (ref 4.22–5.81)
RDW: 18 % — ABNORMAL HIGH (ref 11.5–15.5)
WBC: 13.6 10*3/uL — ABNORMAL HIGH (ref 4.0–10.5)
nRBC: 0 % (ref 0.0–0.2)

## 2021-03-13 LAB — COMPREHENSIVE METABOLIC PANEL
ALT: 13 U/L (ref 0–44)
AST: 13 U/L — ABNORMAL LOW (ref 15–41)
Albumin: 3.5 g/dL (ref 3.5–5.0)
Alkaline Phosphatase: 50 U/L (ref 38–126)
Anion gap: 11 (ref 5–15)
BUN: 60 mg/dL — ABNORMAL HIGH (ref 6–20)
CO2: 29 mmol/L (ref 22–32)
Calcium: 8.6 mg/dL — ABNORMAL LOW (ref 8.9–10.3)
Chloride: 97 mmol/L — ABNORMAL LOW (ref 98–111)
Creatinine, Ser: 1.73 mg/dL — ABNORMAL HIGH (ref 0.61–1.24)
GFR, Estimated: 45 mL/min — ABNORMAL LOW (ref >60)
Glucose, Bld: 132 mg/dL — ABNORMAL HIGH (ref 70–99)
Potassium: 4.4 mmol/L (ref 3.5–5.1)
Sodium: 137 mmol/L (ref 135–145)
Total Bilirubin: 1.9 mg/dL — ABNORMAL HIGH (ref 0.3–1.2)
Total Protein: 7.1 g/dL (ref 6.5–8.1)

## 2021-03-13 LAB — TROPONIN I (HIGH SENSITIVITY)
Troponin I (High Sensitivity): 5 ng/L (ref <18)
Troponin I (High Sensitivity): 5 ng/L (ref <18)
Troponin I (High Sensitivity): 4 ng/L (ref <18)

## 2021-03-13 LAB — BLOOD GAS, ARTERIAL
Acid-base deficit: 1.2 mmol/L (ref 0.0–2.0)
Allens test (pass/fail): POSITIVE — AB
Bicarbonate: 27.2 mmol/L (ref 20.0–28.0)
FIO2: 40
O2 Saturation: 92.7 %
Patient temperature: 98.6
RATE: 10 resp/min
pCO2 arterial: 60.8 mmHg — ABNORMAL HIGH (ref 32.0–48.0)
pH, Arterial: 7.274 — ABNORMAL LOW (ref 7.350–7.450)
pO2, Arterial: 69.8 mmHg — ABNORMAL LOW (ref 83.0–108.0)

## 2021-03-13 LAB — ECHOCARDIOGRAM COMPLETE
Area-P 1/2: 5.46 cm2
Height: 67 in
S' Lateral: 4 cm
Weight: 3248 oz

## 2021-03-13 LAB — GLUCOSE, CAPILLARY
Glucose-Capillary: 150 mg/dL — ABNORMAL HIGH (ref 70–99)
Glucose-Capillary: 267 mg/dL — ABNORMAL HIGH (ref 70–99)
Glucose-Capillary: 361 mg/dL — ABNORMAL HIGH (ref 70–99)

## 2021-03-13 LAB — CORTISOL-AM, BLOOD: Cortisol - AM: 22.5 ug/dL (ref 6.7–22.6)

## 2021-03-13 LAB — HEPARIN LEVEL (UNFRACTIONATED): Heparin Unfractionated: <0.1 IU/mL — ABNORMAL LOW (ref 0.30–0.70)

## 2021-03-13 LAB — HIV ANTIBODY (ROUTINE TESTING W REFLEX): HIV Screen 4th Generation wRfx: NONREACTIVE

## 2021-03-13 LAB — STREP PNEUMONIAE URINARY ANTIGEN: Strep Pneumo Urinary Antigen: NEGATIVE

## 2021-03-13 LAB — PROCALCITONIN: Procalcitonin: 4.05 ng/mL

## 2021-03-13 LAB — PROTIME-INR
INR: 1.2 (ref 0.8–1.2)
Prothrombin Time: 15 seconds (ref 11.4–15.2)

## 2021-03-13 MED ORDER — METHYLPREDNISOLONE SODIUM SUCC 125 MG IJ SOLR
60.0000 mg | Freq: Two times a day (BID) | INTRAMUSCULAR | Status: DC
  Administered 2021-03-13 – 2021-03-18 (×10): 60 mg via INTRAVENOUS
  Filled 2021-03-13 (×10): qty 2

## 2021-03-13 MED ORDER — INSULIN ASPART 100 UNIT/ML IJ SOLN
0.0000 [IU] | Freq: Every day | INTRAMUSCULAR | Status: DC
  Administered 2021-03-13: 5 [IU] via SUBCUTANEOUS
  Administered 2021-03-16: 2 [IU] via SUBCUTANEOUS

## 2021-03-13 MED ORDER — METHYLPREDNISOLONE SODIUM SUCC 125 MG IJ SOLR
125.0000 mg | Freq: Once | INTRAMUSCULAR | Status: AC
  Administered 2021-03-13: 125 mg via INTRAVENOUS
  Filled 2021-03-13: qty 2

## 2021-03-13 MED ORDER — IPRATROPIUM-ALBUTEROL 0.5-2.5 (3) MG/3ML IN SOLN
3.0000 mL | Freq: Four times a day (QID) | RESPIRATORY_TRACT | Status: DC
  Administered 2021-03-13 – 2021-03-18 (×19): 3 mL via RESPIRATORY_TRACT
  Filled 2021-03-13 (×19): qty 3

## 2021-03-13 MED ORDER — ORAL CARE MOUTH RINSE
15.0000 mL | Freq: Two times a day (BID) | OROMUCOSAL | Status: DC
  Administered 2021-03-13 – 2021-03-16 (×6): 15 mL via OROMUCOSAL

## 2021-03-13 MED ORDER — HEPARIN BOLUS VIA INFUSION
2000.0000 [IU] | Freq: Once | INTRAVENOUS | Status: AC
  Administered 2021-03-13: 2000 [IU] via INTRAVENOUS
  Filled 2021-03-13: qty 2000

## 2021-03-13 MED ORDER — HEPARIN (PORCINE) 25000 UT/250ML-% IV SOLN
1450.0000 [IU]/h | INTRAVENOUS | Status: DC
  Administered 2021-03-13: 1450 [IU]/h via INTRAVENOUS
  Filled 2021-03-13: qty 250

## 2021-03-13 MED ORDER — INSULIN ASPART 100 UNIT/ML IJ SOLN
0.0000 [IU] | Freq: Three times a day (TID) | INTRAMUSCULAR | Status: DC
  Administered 2021-03-13: 2 [IU] via SUBCUTANEOUS
  Administered 2021-03-13: 8 [IU] via SUBCUTANEOUS
  Administered 2021-03-14 (×2): 11 [IU] via SUBCUTANEOUS
  Administered 2021-03-14: 8 [IU] via SUBCUTANEOUS
  Administered 2021-03-15 (×2): 5 [IU] via SUBCUTANEOUS
  Administered 2021-03-15 – 2021-03-16 (×2): 8 [IU] via SUBCUTANEOUS
  Administered 2021-03-16 – 2021-03-17 (×2): 3 [IU] via SUBCUTANEOUS
  Administered 2021-03-17: 5 [IU] via SUBCUTANEOUS
  Administered 2021-03-17: 8 [IU] via SUBCUTANEOUS
  Administered 2021-03-18: 15 [IU] via SUBCUTANEOUS
  Administered 2021-03-18: 5 [IU] via SUBCUTANEOUS

## 2021-03-13 MED ORDER — ENOXAPARIN SODIUM 40 MG/0.4ML IJ SOSY
40.0000 mg | PREFILLED_SYRINGE | INTRAMUSCULAR | Status: DC
  Administered 2021-03-13 – 2021-03-17 (×5): 40 mg via SUBCUTANEOUS
  Filled 2021-03-13 (×5): qty 0.4

## 2021-03-13 MED ORDER — CHLORHEXIDINE GLUCONATE 0.12 % MT SOLN
15.0000 mL | Freq: Two times a day (BID) | OROMUCOSAL | Status: DC
  Administered 2021-03-13 – 2021-03-18 (×10): 15 mL via OROMUCOSAL
  Filled 2021-03-13 (×11): qty 15

## 2021-03-13 MED ORDER — TECHNETIUM TO 99M ALBUMIN AGGREGATED
4.4000 | Freq: Once | INTRAVENOUS | Status: AC
  Administered 2021-03-13: 4.4 via INTRAVENOUS

## 2021-03-13 NOTE — Plan of Care (Signed)
  Problem: Health Behavior/Discharge Planning: Goal: Ability to manage health-related needs will improve Outcome: Progressing   Problem: Clinical Measurements: Goal: Respiratory complications will improve Outcome: Progressing   Problem: Pain Managment: Goal: General experience of comfort will improve Outcome: Progressing

## 2021-03-13 NOTE — Progress Notes (Signed)
   03/12/21 2223  Assess: MEWS Score  Temp 99.8 F (37.7 C)  BP 109/72  Pulse Rate (!) 123  Resp (!) 26  Level of Consciousness Alert  SpO2 93 %  O2 Device HFNC  O2 Flow Rate (L/min) 10 L/min  Assess: MEWS Score  MEWS Temp 0  MEWS Systolic 0  MEWS Pulse 2  MEWS RR 2  MEWS LOC 0  MEWS Score 4  MEWS Score Color Red  Assess: if the MEWS score is Yellow or Red  Were vital signs taken at a resting state? Yes  Focused Assessment No change from prior assessment  Does the patient meet 2 or more of the SIRS criteria? Yes  Does the patient have a confirmed or suspected source of infection? Yes  Provider and Rapid Response Notified? Yes  MEWS guidelines implemented *See Row Information* Yes  Treat  MEWS Interventions Administered scheduled meds/treatments  Pain Scale 0-10  Pain Score 0  Notify: Charge Nurse/RN  Name of Charge Nurse/RN Notified Hilda, RN  Date Charge Nurse/RN Notified 03/12/21  Time Charge Nurse/RN Notified 2230  Notify: Rapid Response  Name of Rapid Response RN Notified Janelle  Date Rapid Response Notified 03/12/21  Time Rapid Response Notified 2240  Assess: SIRS CRITERIA  SIRS Temperature  0  SIRS Pulse 1  SIRS Respirations  1  SIRS WBC 1  SIRS Score Sum  3

## 2021-03-13 NOTE — Progress Notes (Signed)
ANTICOAGULATION CONSULT NOTE - Initial Consult  Pharmacy Consult for Heparin Indication: Rule out VTE  Allergies  Allergen Reactions   Aspirin Diarrhea and Nausea And Vomiting   Penicillins Diarrhea and Nausea And Vomiting    Patient Measurements: Height: 5\' 7"  (170.2 cm) Weight: 92.1 kg (203 lb) IBW/kg (Calculated) : 66.1 Heparin Dosing Weight: 85.5 kg  Vital Signs: Temp: 98.1 F (36.7 C) (06/26 0520) Temp Source: Oral (06/26 0520) BP: 91/64 (06/26 0600) Pulse Rate: 96 (06/26 0751)  Labs: Recent Labs    03/12/21 1412 03/12/21 1802 03/13/21 0329  HGB 19.3* 18.9* 17.1*  HCT 59.0* 59.5* 55.9*  PLT 151 148* 137*  APTT 31  --   --   LABPROT 15.0  --  15.0  INR 1.2  --  1.2  CREATININE 1.83*  --  1.73*    Estimated Creatinine Clearance: 51 mL/min (A) (by C-G formula based on SCr of 1.73 mg/dL (H)).   Medical History: Past Medical History:  Diagnosis Date   COPD (chronic obstructive pulmonary disease) (HCC)    Hypertension     Medications:  Scheduled:   arformoterol  15 mcg Nebulization BID   And   umeclidinium bromide  1 puff Inhalation Daily   atorvastatin  20 mg Oral Daily   chlorhexidine  15 mL Mouth Rinse BID   enoxaparin (LOVENOX) injection  40 mg Subcutaneous Q24H   guaiFENesin  600 mg Oral BID   mouth rinse  15 mL Mouth Rinse q12n4p   Infusions:   sodium chloride 100 mL/hr at 03/13/21 0416   azithromycin     cefTRIAXone (ROCEPHIN)  IV     PRN: acetaminophen **OR** acetaminophen, albuterol, ondansetron **OR** ondansetron (ZOFRAN) IV  Assessment: 55 yo male with hx HLD, COPD, HTN presents with dyspnea. D-dimer elevated, concern for PE, so Pharmacy consulted to dose IV heparin.  Baseline INR 1.2 CBC: H/H elevated, Plts trending down (151 > 137) - monitor AKI, SCr 1.83 on admit No bleeding issues noted Received Lovenox 40mg  SQ yesterday 6/25 at 18:09  Goal of Therapy:  Heparin level 0.3-0.7 units/ml Monitor platelets by anticoagulation  protocol: Yes   Plan:  DC Lovenox Give 2000 units bolus x 1 Start heparin infusion at 1450 units/hr Check anti-Xa level in 8 hours and daily while on heparin Continue to monitor H&H and platelets  Peggyann Juba, PharmD, BCPS Pharmacy: [phone] 03/13/2021,8:33 AM

## 2021-03-13 NOTE — Progress Notes (Signed)
PROGRESS NOTE    Maxwell Grant  KPT:[id number] DOB: November 11, 1965 DOA: 03/12/2021 PCP: Pcp, No   Brief Narrative:  HPI: Maxwell Grant is a 55 y.o. male with medical history significant of HLD, COPD, HTN. Presenting with dyspnea. His symptoms started 3 days ago. He felt it difficult to breathe. He thought it might be some allergies. So he tried some allegra, but it didn't help. He has non-productive cough as well. He tried his inhalers but they didn't work. His symptoms worsened through this morning, so he came to the ED for help. Of note, his wife was sick with bronchitis and a sinus infection this week. He denies any other aggravating or alleviating factors.    ED Course: CXR was concerning for PNA. EDP started CAP coverage. He was noted to have an AKI. He was given fluids. TRH was called for admission.   Assessment & Plan:   Active Problems:   Pneumonia  Severe sepsis/acute hypoxic respiratory failure secondary to community-acquired pneumonia/COPD exacerbation/respiratory acidosis: Met severe sepsis criteria based on tachycardia, tachypnea and hypoxia.  Although he feels better but this morning he was on BiPAP.  His ABG is still showing respiratory acidosis.  When I saw him this morning, he was taking a break and was on nasal cannula.  He stated that his breathing is better.  On exam has slightly coarse breath sounds at the bases but diffuse expiratory wheezes bilaterally.  I think he is also having COPD exacerbation.  For that, I want to give him 1 dose of Solu-Medrol 125 mg now and start him on 60 mg IV twice daily.  We will continue Rocephin and Zithromax.  Elevated D-dimer and significant amount of hypoxia raises concern for possible PE.  Due to AKI, unable to do CT angiogram.  We will proceed with VQ scan as well as Doppler lower extremity.  Due to high suspicion, will empirically start him on heparin drip with pharmacy to consult until PE ruled out. Continue bronchodilators and rest of the  medications.  We will repeat ABG in few hours.  BNP slightly elevated.  We will also check troponins and transthoracic echo.  AKI: Not sure whether he has AKI or CKD.  Slight improvement in creatinine.  Continue gentle hydration.  Repeat labs in the morning.  Avoid nephrotoxic agents.  History of hypertension: Takes amlodipine, chlorthalidone and lisinopril at home but blood pressure on the low normal so continue to hold all of those medications.  Type 2 diabetes mellitus: Takes glimepiride at home.  We will check hemoglobin A1c and place him on SSI.  Hyperlipidemia: Continue statin.  Tolerable abuse/dependence: Counseling to quit provided.   DVT prophylaxis:    Code Status: Full Code  Family Communication: None present at bedside.  Plan of care discussed with patient in length and he verbalized understanding and agreed with it.  Status is: Inpatient  Remains inpatient appropriate because:Hemodynamically unstable  Dispo: The patient is from: Home              Anticipated d/c is to: Home              Patient currently is not medically stable to d/c.   Difficult to place patient No        Estimated body mass index is 31.79 kg/m as calculated from the following:   Height as of this encounter: $RemoveBeforeD'5\' 7"'[id number]$  (1.702 m).   Weight as of this encounter: 92.1 kg.      Nutritional status:  Consultants:  None  Procedures:  None  Antimicrobials:  Anti-infectives (From admission, onward)    Start     Dose/Rate Route Frequency Ordered Stop   03/13/21 1000  cefTRIAXone (ROCEPHIN) 2 g in sodium chloride 0.9 % 100 mL IVPB        2 g 200 mL/hr over 30 Minutes Intravenous Every 24 hours 03/12/21 1736     03/13/21 1000  azithromycin (ZITHROMAX) 500 mg in sodium chloride 0.9 % 250 mL IVPB        500 mg 250 mL/hr over 60 Minutes Intravenous Every 24 hours 03/12/21 1736     03/12/21 1515  azithromycin (ZITHROMAX) 500 mg in sodium chloride 0.9 % 250 mL IVPB  Status:   Discontinued        500 mg 250 mL/hr over 60 Minutes Intravenous Every 24 hours 03/12/21 1502 03/12/21 1737   03/12/21 1515  cefTRIAXone (ROCEPHIN) 1 g in sodium chloride 0.9 % 100 mL IVPB  Status:  Discontinued        1 g 200 mL/hr over 30 Minutes Intravenous Every 24 hours 03/12/21 1502 03/12/21 1737          Subjective: Patient seen and examined.  He was off of BiPAP.  Daughter at the bedside.  He stated that he was feeling better.  No new complaint.  Continues to smoke and has a known diagnosis of COPD but he is not taking any medications for maintenance.  Has never seen a pulmonologist.  Objective: Vitals:   03/13/21 0600 03/13/21 0751 03/13/21 0900 03/13/21 0913  BP: 91/64 104/62 104/62   Pulse: 91 96    Resp: (!) 25 20    Temp:  98 F (36.7 C)    TempSrc:  Axillary    SpO2: 90% 97%  90%  Weight:      Height:        Intake/Output Summary (Last 24 hours) at 03/13/2021 1123 Last data filed at 03/13/2021 0301 Gross per 24 hour  Intake 1700.12 ml  Output 175 ml  Net 1525.12 ml   Filed Weights   03/12/21 1309 03/12/21 1451  Weight: 92.1 kg 92.1 kg    Examination:  General exam: Appears calm and comfortable, morbidly obese Respiratory system: Coarse breath sounds at the bases bilaterally with diffuse bilateral expiratory wheezes, respiratory effort normal. Cardiovascular system: S1 & S2 heard, RRR. No JVD, murmurs, rubs, gallops or clicks. No pedal edema. Gastrointestinal system: Abdomen is nondistended, soft and nontender. No organomegaly or masses felt. Normal bowel sounds heard. Central nervous system: Alert and oriented. No focal neurological deficits. Extremities: Symmetric 5 x 5 power. Skin: No rashes, lesions or ulcers Psychiatry: Judgement and insight appear normal. Mood & affect appropriate.    Data Reviewed: I have personally reviewed following labs and imaging studies  CBC: Recent Labs  Lab 03/12/21 1412 03/12/21 1802 03/13/21 0329  WBC 16.6*  15.7* 13.6*  NEUTROABS 13.8* 12.8*  --   HGB 19.3* 18.9* 17.1*  HCT 59.0* 59.5* 55.9*  MCV 99.2 101.0* 103.3*  PLT 151 148* 314*   Basic Metabolic Panel: Recent Labs  Lab 03/12/21 1412 03/13/21 0329  NA 134* 137  K 4.3 4.4  CL 95* 97*  CO2 27 29  GLUCOSE 126* 132*  BUN 54* 60*  CREATININE 1.83* 1.73*  CALCIUM 9.2 8.6*   GFR: Estimated Creatinine Clearance: 51 mL/min (A) (by C-G formula based on SCr of 1.73 mg/dL (H)). Liver Function Tests: Recent Labs  Lab 03/12/21 1412 03/13/21 0329  AST 14* 13*  ALT 15 13  ALKPHOS 58 50  BILITOT 2.4* 1.9*  PROT 8.2* 7.1  ALBUMIN 4.3 3.5   No results for input(s): LIPASE, AMYLASE in the last 168 hours. No results for input(s): AMMONIA in the last 168 hours. Coagulation Profile: Recent Labs  Lab 03/12/21 1412 03/13/21 0329  INR 1.2 1.2   Cardiac Enzymes: No results for input(s): CKTOTAL, CKMB, CKMBINDEX, TROPONINI in the last 168 hours. BNP (last 3 results) No results for input(s): PROBNP in the last 8760 hours. HbA1C: No results for input(s): HGBA1C in the last 72 hours. CBG: No results for input(s): GLUCAP in the last 168 hours. Lipid Profile: No results for input(s): CHOL, HDL, LDLCALC, TRIG, CHOLHDL, LDLDIRECT in the last 72 hours. Thyroid Function Tests: No results for input(s): TSH, T4TOTAL, FREET4, T3FREE, THYROIDAB in the last 72 hours. Anemia Panel: No results for input(s): VITAMINB12, FOLATE, FERRITIN, TIBC, IRON, RETICCTPCT in the last 72 hours. Sepsis Labs: Recent Labs  Lab 03/12/21 1411 03/13/21 0329  PROCALCITON  --  4.05  LATICACIDVEN 1.0  --     Recent Results (from the past 240 hour(s))  Urine culture     Status: None (Preliminary result)   Collection Time: 03/12/21  1:22 PM   Specimen: Urine, Clean Catch  Result Value Ref Range Status   Specimen Description   Final    URINE, CLEAN CATCH Performed at [facility], [place] [address], [place], [place] [address]    Special  Requests   Final    NONE Performed at [facility], [place] [address], [place], [place] [address]    Culture PENDING  Incomplete   Report Status PENDING  Incomplete  Resp Panel by RT-PCR (Flu A&B, Covid) Nasopharyngeal Swab     Status: None   Collection Time: 03/12/21  2:11 PM   Specimen: Nasopharyngeal Swab; Nasopharyngeal(NP) swabs in vial transport medium  Result Value Ref Range Status   SARS Coronavirus 2 by RT PCR NEGATIVE NEGATIVE Final    Comment: (NOTE) SARS-CoV-2 target nucleic acids are NOT DETECTED.  The SARS-CoV-2 RNA is generally detectable in upper respiratory specimens during the acute phase of infection. The lowest concentration of SARS-CoV-2 viral copies this assay can detect is 138 copies/mL. A negative result does not preclude SARS-Cov-2 infection and should not be used as the sole basis for treatment or other patient management decisions. A negative result may occur with  improper specimen collection/handling, submission of specimen other than nasopharyngeal swab, presence of viral mutation(s) within the areas targeted by this assay, and inadequate number of viral copies(<138 copies/mL). A negative result must be combined with clinical observations, patient history, and epidemiological information. The expected result is Negative.  Fact Sheet for Patients:  EntrepreneurPulse.com.au  Fact Sheet for Healthcare Providers:  IncredibleEmployment.be  This test is no t yet approved or cleared by the [place] FDA and  has been authorized for detection and/or diagnosis of SARS-CoV-2 by FDA under an Emergency Use Authorization (EUA). This EUA will remain  in effect (meaning this test can be used) for the duration of the COVID-19 declaration under Section 564(b)(1) of the Act, 21 U.S.C.section 360bbb-3(b)(1), unless the authorization is terminated  or revoked sooner.       Influenza A by PCR NEGATIVE NEGATIVE Final    Influenza B by PCR NEGATIVE NEGATIVE Final    Comment: (NOTE) The Xpert Xpress SARS-CoV-2/FLU/RSV plus assay is intended as an aid in the diagnosis of influenza from Nasopharyngeal swab  specimens and should not be used as a sole basis for treatment. Nasal washings and aspirates are unacceptable for Xpert Xpress SARS-CoV-2/FLU/RSV testing.  Fact Sheet for Patients: EntrepreneurPulse.com.au  Fact Sheet for Healthcare Providers: IncredibleEmployment.be  This test is not yet approved or cleared by the [place] FDA and has been authorized for detection and/or diagnosis of SARS-CoV-2 by FDA under an Emergency Use Authorization (EUA). This EUA will remain in effect (meaning this test can be used) for the duration of the COVID-19 declaration under Section 564(b)(1) of the Act, 21 U.S.C. section 360bbb-3(b)(1), unless the authorization is terminated or revoked.  Performed at [facility], [place] [address], [place], [place] [address]   Blood Culture (routine x 2)     Status: None (Preliminary result)   Collection Time: 03/12/21  2:12 PM   Specimen: BLOOD  Result Value Ref Range Status   Specimen Description   Final    BLOOD RIGHT ARM Performed at [place] [address] [place], [place] [address]    Special Requests   Final    BOTTLES DRAWN AEROBIC AND ANAEROBIC Blood Culture adequate volume Performed at [place] [address] [place], [place] [address]    Culture   Final    NO GROWTH < 24 HOURS Performed at [facility] [address], [place], [place] [address]    Report Status PENDING  Incomplete  Blood Culture (routine x 2)     Status: None (Preliminary result)   Collection Time: 03/12/21  2:22 PM   Specimen: BLOOD  Result Value Ref Range Status   Specimen Description   Final    BLOOD RIGHT ANTECUBITAL Performed at [place]  [address], [place], [place] [address]    Special Requests   Final    BOTTLES DRAWN AEROBIC AND ANAEROBIC Blood Culture adequate volume Performed at [place] [address], [place], [place] [address]    Culture   Final    NO GROWTH < 24 HOURS Performed at [facility] [address], [place], [place] [address]    Report Status PENDING  Incomplete      Radiology Studies: DG Chest Port 1 View  Result Date: 03/12/2021 CLINICAL DATA:  55 year old male with a history of cough EXAM: PORTABLE CHEST 1 VIEW COMPARISON:  05/01/2005 FINDINGS: Cardiomediastinal silhouette unchanged in size and contour. Prominence of the left vascular pedicle is similar to the comparison plain film. Interlobular septal thickening of the bilateral lungs. No pneumothorax. Blunting of the bilateral costophrenic angles. No displaced fracture IMPRESSION: Interlobular septal thickening with trace bilateral pleural effusions suggesting pulmonary edema. Atypical infection cannot be excluded. Electronically Signed   By: Corrie Mckusick D.O.   On: 03/12/2021 14:24   VAS [place] LOWER EXTREMITY VENOUS (DVT)  Result Date: 03/13/2021  Lower Venous DVT Study Patient Name:  JES COSTALES  Date of Exam:   03/13/2021 Medical Rec #: [id number]       Accession #:    [id number] Date of Birth: Jul 24, 1966        Patient Gender: M Patient Age:   55Y Exam Location:  [facility] Procedure:      VAS [place] LOWER EXTREMITY VENOUS (DVT) Referring Phys: [id number] Martez Weiand --------------------------------------------------------------------------------  Indications: SOB.  Comparison Study: No prior study Performing Technologist: Maudry Mayhew MHA, RDMS, RVT, RDCS  Examination Guidelines: A complete evaluation includes B-mode imaging, spectral Doppler, color Doppler, and power Doppler as  needed of all accessible portions of each vessel. Bilateral testing is considered an integral part of a complete examination. Limited  examinations for reoccurring indications may be performed as noted. The reflux portion of the exam is performed with the patient in reverse Trendelenburg.  +---------+---------------+---------+-----------+----------+--------------+ RIGHT    CompressibilityPhasicitySpontaneityPropertiesThrombus Aging +---------+---------------+---------+-----------+----------+--------------+ CFV      Full           Yes      Yes                                 +---------+---------------+---------+-----------+----------+--------------+ SFJ      Full                                                        +---------+---------------+---------+-----------+----------+--------------+ FV Prox  Full                                                        +---------+---------------+---------+-----------+----------+--------------+ FV Mid   Full                                                        +---------+---------------+---------+-----------+----------+--------------+ FV DistalFull                                                        +---------+---------------+---------+-----------+----------+--------------+ PFV      Full                                                        +---------+---------------+---------+-----------+----------+--------------+ POP      Full           Yes      Yes                                 +---------+---------------+---------+-----------+----------+--------------+ PTV      Full                                                        +---------+---------------+---------+-----------+----------+--------------+ PERO     Full                                                        +---------+---------------+---------+-----------+----------+--------------+   +---------+---------------+---------+-----------+----------+--------------+  LEFT     CompressibilityPhasicitySpontaneityPropertiesThrombus Aging  +---------+---------------+---------+-----------+----------+--------------+ CFV      Full           Yes      Yes                                 +---------+---------------+---------+-----------+----------+--------------+ SFJ      Full                                                        +---------+---------------+---------+-----------+----------+--------------+ FV Prox  Full                                                        +---------+---------------+---------+-----------+----------+--------------+ FV Mid   Full                                                        +---------+---------------+---------+-----------+----------+--------------+ FV DistalFull                                                        +---------+---------------+---------+-----------+----------+--------------+ PFV      Full                                                        +---------+---------------+---------+-----------+----------+--------------+ POP      Full           Yes      Yes                                 +---------+---------------+---------+-----------+----------+--------------+ PTV      Full                                                        +---------+---------------+---------+-----------+----------+--------------+ PERO     Full                                                        +---------+---------------+---------+-----------+----------+--------------+     Summary: RIGHT: - There is no evidence of deep vein thrombosis in the lower extremity.  - No cystic structure found in the popliteal fossa.  LEFT: - There is no evidence of deep vein thrombosis in the lower extremity.  - No  cystic structure found in the popliteal fossa.  Bilateral PTV and peroneal veins are dilated with rouleaux flow; cannot exclude future formation of thrombus.  *See table(s) above for measurements and observations.    Preliminary     Scheduled Meds:  arformoterol  15 mcg  Nebulization BID   And   umeclidinium bromide  1 puff Inhalation Daily   atorvastatin  20 mg Oral Daily   chlorhexidine  15 mL Mouth Rinse BID   guaiFENesin  600 mg Oral BID   mouth rinse  15 mL Mouth Rinse q12n4p   Continuous Infusions:  sodium chloride 100 mL/hr at 03/13/21 0416   azithromycin 500 mg (03/13/21 0934)   cefTRIAXone (ROCEPHIN)  IV 2 g (03/13/21 0903)   heparin 1,450 Units/hr (03/13/21 0929)     LOS: 1 day   Time spent: 37 minutes   Darliss Cheney, MD [facility]  03/13/2021, 11:23 AM   How to contact the [facility] Attending or Consulting provider [place] or covering provider during after hours [place], for this patient?  Check the care team in [facility] and look for a) attending/consulting [facility] provider listed and b) the [facility] team listed. Page or secure chat [phone]. Log into [email or web address] and use [place]'s universal password to access. If you do not have the password, please contact the hospital operator. Locate the [facility] provider you are looking for under [facility] and page to a number that you can be directly reached. If you still have difficulty reaching the provider, please page the [facility] (Director on Call) for the Hospitalists listed on amion for assistance.

## 2021-03-13 NOTE — Progress Notes (Signed)
  Echocardiogram 2D Echocardiogram has been performed.  Maxwell Grant 03/13/2021, 1:25 PM

## 2021-03-13 NOTE — Progress Notes (Signed)
Bilateral lower extremity venous duplex completed. Refer to "CV Proc" under chart review to view preliminary results.  03/13/2021 10:19 AM Kelby Aline., MHA, RVT, RDCS, RDMS

## 2021-03-13 NOTE — Progress Notes (Signed)
Rapid Response Event Note   Reason for Call : pt SOB   Initial Focused Assessment:  pt A/O, SOB, audible wheezes and using accessory muscles.  Pt able to follow commands.  Denies pain.  Pt states he doesn't use a CPAP at home.  Pt states "I am very sleepy and I just want to get some sleep." Pt sinus tach on monitor.  Interventions: RT at the bedside giving pt prn breathing treatments.  See new orders per [facility], NP received and initiated.  See chart for VS.  Pt will receive an ABG.     Plan of Care: Pt will remain in current location and receive another ABG after BIPAP applied.   Event Summary:   MD Notified: yes Kennon Holter, NP) Call Time: 2243 Arrival Time: 2255 End Time: 2359  Dyann Ruddle, RN

## 2021-03-13 NOTE — Progress Notes (Signed)
Pt refuses Blood Gas.

## 2021-03-14 DIAGNOSIS — E782 Mixed hyperlipidemia: Secondary | ICD-10-CM

## 2021-03-14 DIAGNOSIS — A419 Sepsis, unspecified organism: Secondary | ICD-10-CM

## 2021-03-14 DIAGNOSIS — E8729 Other acidosis: Secondary | ICD-10-CM

## 2021-03-14 DIAGNOSIS — J441 Chronic obstructive pulmonary disease with (acute) exacerbation: Secondary | ICD-10-CM

## 2021-03-14 DIAGNOSIS — E119 Type 2 diabetes mellitus without complications: Secondary | ICD-10-CM

## 2021-03-14 DIAGNOSIS — J9691 Respiratory failure, unspecified with hypoxia: Secondary | ICD-10-CM

## 2021-03-14 DIAGNOSIS — I10 Essential (primary) hypertension: Secondary | ICD-10-CM

## 2021-03-14 DIAGNOSIS — N179 Acute kidney failure, unspecified: Secondary | ICD-10-CM

## 2021-03-14 LAB — BLOOD GAS, ARTERIAL
Acid-base deficit: 0.5 mmol/L (ref 0.0–2.0)
Bicarbonate: 29.2 mmol/L — ABNORMAL HIGH (ref 20.0–28.0)
O2 Saturation: 92.9 %
Patient temperature: 98.6
pCO2 arterial: 70.6 mmHg (ref 32.0–48.0)
pH, Arterial: 7.24 — ABNORMAL LOW (ref 7.350–7.450)
pO2, Arterial: 71.8 mmHg — ABNORMAL LOW (ref 83.0–108.0)

## 2021-03-14 LAB — GLUCOSE, CAPILLARY
Glucose-Capillary: 268 mg/dL — ABNORMAL HIGH (ref 70–99)
Glucose-Capillary: 330 mg/dL — ABNORMAL HIGH (ref 70–99)
Glucose-Capillary: 342 mg/dL — ABNORMAL HIGH (ref 70–99)
Glucose-Capillary: 196 mg/dL — ABNORMAL HIGH (ref 70–99)

## 2021-03-14 LAB — CBC WITH DIFFERENTIAL/PLATELET
Abs Immature Granulocytes: 0.02 10*3/uL (ref 0.00–0.07)
Basophils Absolute: 0 10*3/uL (ref 0.0–0.1)
Basophils Relative: 0 %
Eosinophils Absolute: 0 10*3/uL (ref 0.0–0.5)
Eosinophils Relative: 0 %
HCT: 55.1 % — ABNORMAL HIGH (ref 39.0–52.0)
Hemoglobin: 16.8 g/dL (ref 13.0–17.0)
Immature Granulocytes: 0 %
Lymphocytes Relative: 4 %
Lymphs Abs: 0.3 10*3/uL — ABNORMAL LOW (ref 0.7–4.0)
MCH: 31.8 pg (ref 26.0–34.0)
MCHC: 30.5 g/dL (ref 30.0–36.0)
MCV: 104.2 fL — ABNORMAL HIGH (ref 80.0–100.0)
Monocytes Absolute: 0.2 10*3/uL (ref 0.1–1.0)
Monocytes Relative: 2 %
Neutro Abs: 7.9 10*3/uL — ABNORMAL HIGH (ref 1.7–7.7)
Neutrophils Relative %: 94 %
Platelets: 139 10*3/uL — ABNORMAL LOW (ref 150–400)
RBC: 5.29 MIL/uL (ref 4.22–5.81)
RDW: 17.4 % — ABNORMAL HIGH (ref 11.5–15.5)
WBC: 8.4 10*3/uL (ref 4.0–10.5)
nRBC: 0 % (ref 0.0–0.2)

## 2021-03-14 LAB — BASIC METABOLIC PANEL
Anion gap: 6 (ref 5–15)
BUN: 54 mg/dL — ABNORMAL HIGH (ref 6–20)
CO2: 33 mmol/L — ABNORMAL HIGH (ref 22–32)
Calcium: 9.2 mg/dL (ref 8.9–10.3)
Chloride: 101 mmol/L (ref 98–111)
Creatinine, Ser: 1.29 mg/dL — ABNORMAL HIGH (ref 0.61–1.24)
GFR, Estimated: >60 mL/min (ref >60)
Glucose, Bld: 355 mg/dL — ABNORMAL HIGH (ref 70–99)
Potassium: 4.3 mmol/L (ref 3.5–5.1)
Sodium: 140 mmol/L (ref 135–145)

## 2021-03-14 LAB — HEMOGLOBIN A1C
Hgb A1c MFr Bld: 5.9 % — ABNORMAL HIGH (ref 4.8–5.6)
Mean Plasma Glucose: 123 mg/dL

## 2021-03-14 LAB — URINE CULTURE: Culture: NO GROWTH

## 2021-03-14 LAB — PATHOLOGIST SMEAR REVIEW

## 2021-03-14 LAB — MAGNESIUM: Magnesium: 2.4 mg/dL (ref 1.7–2.4)

## 2021-03-14 MED ORDER — INSULIN GLARGINE 100 UNIT/ML ~~LOC~~ SOLN
20.0000 [IU] | Freq: Every day | SUBCUTANEOUS | Status: DC
  Administered 2021-03-14: 20 [IU] via SUBCUTANEOUS
  Filled 2021-03-14 (×2): qty 0.2

## 2021-03-14 MED ORDER — GUAIFENESIN 100 MG/5ML PO SOLN
5.0000 mL | ORAL | Status: DC | PRN
  Administered 2021-03-14 – 2021-03-15 (×2): 100 mg via ORAL
  Filled 2021-03-14: qty 100
  Filled 2021-03-14: qty 10

## 2021-03-14 NOTE — Progress Notes (Signed)
Pt placed on BIPAP per MD/ABG results

## 2021-03-14 NOTE — Progress Notes (Signed)
Notified Lab that ABG being sent for analysis. 

## 2021-03-14 NOTE — Progress Notes (Signed)
RT came in to give pt his breathing treatment and pt requested to be off of the bipap. He said he did wear it for couple hrs and would like to come off. Remove pt from bipap and placed on 6L salter. No resp distress noted. Pt is resting well at this time.

## 2021-03-14 NOTE — Progress Notes (Signed)
PROGRESS NOTE    Maxwell Grant  TFT:[id number] DOB: Aug 11, 1966 DOA: 03/12/2021 PCP: Pcp, No   Brief Narrative:  HPI: Maxwell Grant is a 55 y.o. male with medical history significant of HLD, COPD, HTN. Presenting with dyspnea. His symptoms started 3 days ago. He felt it difficult to breathe. He thought it might be some allergies. So he tried some allegra, but it didn't help. He has non-productive cough as well. He tried his inhalers but they didn't work. His symptoms worsened through this morning, so he came to the ED for help. Of note, his wife was sick with bronchitis and a sinus infection this week. He denies any other aggravating or alleviating factors.    ED Course: CXR was concerning for PNA. EDP started CAP coverage. He was noted to have an AKI. He was given fluids. TRH was called for admission.   Assessment & Plan:   Active Problems:   Community acquired pneumonia   Severe sepsis ([place])   Respiratory acidosis   COPD with acute exacerbation ([place])   Respiratory failure with hypoxia and hypercapnia (HCC)   AKI (acute kidney injury) ([place])   Essential hypertension   Mixed hyperlipidemia   Type 2 diabetes mellitus (HCC)  Severe sepsis/acute hypoxic and hypercapnic respiratory failure secondary to community-acquired pneumonia/COPD exacerbation/respiratory acidosis: Met severe sepsis criteria based on tachycardia, tachypnea and hypoxia.  DVT and PE ruled out.  He used BiPAP only for 4 hours last night.  He is fully aware that he supposed to use it every time he sleeps.  This morning he was on 5 L of oxygen and denied any shortness of breath and in fact said he is feeling better.  Still has diffuse bilateral wheezes but better than yesterday.  Repeat ABGs once again shows respiratory acidosis with elevated CO2.  RN advised to call respiratory therapist and put him back on BiPAP.  We will repeat ABGs 6 to 8 hours after he is on BiPAP.  We will continue current antibiotics, bronchodilators as  well as Solu-Medrol.  AKI: Not sure whether he has AKI or CKD.  Again slight improvement in creatinine today.  Continue IV fluids.  Avoid nephrotoxic agents.  History of hypertension: Takes amlodipine, chlorthalidone and lisinopril at home but blood pressure on the low normal so continue to hold all of those medications.  Type 2 diabetes mellitus: Takes glimepiride at home.  Hemoglobin A1c only 5.9 but hyperglycemic here likely secondary to Solu-Medrol.  Blood sugar in 300s.  Will start on Lantus 20 units and continue SSI.  Hyperlipidemia: Continue statin.  Tolerable abuse/dependence: Counseling to quit provided.  DVT prophylaxis: enoxaparin (LOVENOX) injection 40 mg Start: 03/13/21 1800   Code Status: Full Code  Family Communication: Daughter present at bedside.  Plan of care discussed with both of them.  Status is: Inpatient  Remains inpatient appropriate because:Hemodynamically unstable  Dispo: The patient is from: Home              Anticipated d/c is to: Home              Patient currently is not medically stable to d/c.   Difficult to place patient No        Estimated body mass index is 31.79 kg/m as calculated from the following:   Height as of this encounter: _0  (1.702 m).   Weight as of this encounter: 92.1 kg.      Nutritional status:  Consultants:  None  Procedures:  None  Antimicrobials:  Anti-infectives (From admission, onward)    Start     Dose/Rate Route Frequency Ordered Stop   03/13/21 1000  cefTRIAXone (ROCEPHIN) 2 g in sodium chloride 0.9 % 100 mL IVPB        2 g 200 mL/hr over 30 Minutes Intravenous Every 24 hours 03/12/21 1736     03/13/21 1000  azithromycin (ZITHROMAX) 500 mg in sodium chloride 0.9 % 250 mL IVPB        500 mg 250 mL/hr over 60 Minutes Intravenous Every 24 hours 03/12/21 1736     03/12/21 1515  azithromycin (ZITHROMAX) 500 mg in sodium chloride 0.9 % 250 mL IVPB  Status:  Discontinued        500  mg 250 mL/hr over 60 Minutes Intravenous Every 24 hours 03/12/21 1502 03/12/21 1737   03/12/21 1515  cefTRIAXone (ROCEPHIN) 1 g in sodium chloride 0.9 % 100 mL IVPB  Status:  Discontinued        1 g 200 mL/hr over 30 Minutes Intravenous Every 24 hours 03/12/21 1502 03/12/21 1737          Subjective: Seen and examined.  Daughter at the bedside.  His breathing is better today according to him.  No new complaint.  Objective: Vitals:   03/14/21 0345 03/14/21 0741 03/14/21 1109 03/14/21 1312  BP: 133/79   126/77  Pulse: 98  99 93  Resp: (!) 21   (!) 26  Temp: 97.7 F (36.5 C)   97.9 F (36.6 C)  TempSrc: Oral   Axillary  SpO2: 91% 94% 94% 95%  Weight:      Height:        Intake/Output Summary (Last 24 hours) at 03/14/2021 1349 Last data filed at 03/14/2021 1051 Gross per 24 hour  Intake 3278.56 ml  Output 2326 ml  Net 952.56 ml   Filed Weights   03/12/21 1309 03/12/21 1451  Weight: 92.1 kg 92.1 kg    Examination:  General exam: Appears calm and comfortable  Respiratory system: Diffuse expiratory wheezes bilaterally, scattered rhonchi but faint, overall improved compared to yesterday.  Respiratory effort normal. Cardiovascular system: S1 & S2 heard, RRR. No JVD, murmurs, rubs, gallops or clicks. No pedal edema. Gastrointestinal system: Abdomen is nondistended, soft and nontender. No organomegaly or masses felt. Normal bowel sounds heard. Central nervous system: Alert and oriented. No focal neurological deficits. Extremities: Symmetric 5 x 5 power. Skin: No rashes, lesions or ulcers.  Psychiatry: Judgement and insight appear normal. Mood & affect appropriate.   Data Reviewed: I have personally reviewed following labs and imaging studies  CBC: Recent Labs  Lab 03/12/21 1412 03/12/21 1802 03/13/21 0329 03/14/21 0318  WBC 16.6* 15.7* 13.6* 8.4  NEUTROABS 13.8* 12.8*  --  7.9*  HGB 19.3* 18.9* 17.1* 16.8  HCT 59.0* 59.5* 55.9* 55.1*  MCV 99.2 101.0* 103.3* 104.2*   PLT 151 148* 137* 599*   Basic Metabolic Panel: Recent Labs  Lab 03/12/21 1412 03/13/21 0329 03/14/21 0318  NA 134* 137 140  K 4.3 4.4 4.3  CL 95* 97* 101  CO2 27 29 33*  GLUCOSE 126* 132* 355*  BUN 54* 60* 54*  CREATININE 1.83* 1.73* 1.29*  CALCIUM 9.2 8.6* 9.2  MG  --   --  2.4   GFR: Estimated Creatinine Clearance: 68.4 mL/min (A) (by C-G formula based on SCr of 1.29 mg/dL (H)). Liver Function Tests: Recent Labs  Lab 03/12/21 1412 03/13/21 0329  AST 14* 13*  ALT 15 13  ALKPHOS 58 50  BILITOT 2.4* 1.9*  PROT 8.2* 7.1  ALBUMIN 4.3 3.5   No results for input(s): LIPASE, AMYLASE in the last 168 hours. No results for input(s): AMMONIA in the last 168 hours. Coagulation Profile: Recent Labs  Lab 03/12/21 1412 03/13/21 0329  INR 1.2 1.2   Cardiac Enzymes: No results for input(s): CKTOTAL, CKMB, CKMBINDEX, TROPONINI in the last 168 hours. BNP (last 3 results) No results for input(s): PROBNP in the last 8760 hours. HbA1C: Recent Labs    03/13/21 0839  HGBA1C 5.9*   CBG: Recent Labs  Lab 03/13/21 1154 03/13/21 1621 03/13/21 2049 03/14/21 0758 03/14/21 1148  GLUCAP 150* 267* 361* 268* 330*   Lipid Profile: No results for input(s): CHOL, HDL, LDLCALC, TRIG, CHOLHDL, LDLDIRECT in the last 72 hours. Thyroid Function Tests: No results for input(s): TSH, T4TOTAL, FREET4, T3FREE, THYROIDAB in the last 72 hours. Anemia Panel: No results for input(s): VITAMINB12, FOLATE, FERRITIN, TIBC, IRON, RETICCTPCT in the last 72 hours. Sepsis Labs: Recent Labs  Lab 03/12/21 1411 03/13/21 0329  PROCALCITON  --  4.05  LATICACIDVEN 1.0  --     Recent Results (from the past 240 hour(s))  Urine culture     Status: None   Collection Time: 03/12/21  1:22 PM   Specimen: Urine, Clean Catch  Result Value Ref Range Status   Specimen Description   Final    URINE, CLEAN CATCH Performed at [facility], [place] [address], [place], [place] [address]     Special Requests NONE  Final   Culture   Final    NO GROWTH Performed at [facility], [place] [address], [place], [place] [address]    Report Status 03/14/2021 FINAL  Final  Resp Panel by RT-PCR (Flu A&B, Covid) Nasopharyngeal Swab     Status: None   Collection Time: 03/12/21  2:11 PM   Specimen: Nasopharyngeal Swab; Nasopharyngeal(NP) swabs in vial transport medium  Result Value Ref Range Status   SARS Coronavirus 2 by RT PCR NEGATIVE NEGATIVE Final    Comment: (NOTE) SARS-CoV-2 target nucleic acids are NOT DETECTED.  The SARS-CoV-2 RNA is generally detectable in upper respiratory specimens during the acute phase of infection. The lowest concentration of SARS-CoV-2 viral copies this assay can detect is 138 copies/mL. A negative result does not preclude SARS-Cov-2 infection and should not be used as the sole basis for treatment or other patient management decisions. A negative result may occur with  improper specimen collection/handling, submission of specimen other than nasopharyngeal swab, presence of viral mutation(s) within the areas targeted by this assay, and inadequate number of viral copies(<138 copies/mL). A negative result must be combined with clinical observations, patient history, and epidemiological information. The expected result is Negative.  Fact Sheet for Patients:  EntrepreneurPulse.com.au  Fact Sheet for Healthcare Providers:  IncredibleEmployment.be  This test is no t yet approved or cleared by the [place] FDA and  has been authorized for detection and/or diagnosis of SARS-CoV-2 by FDA under an Emergency Use Authorization (EUA). This EUA will remain  in effect (meaning this test can be used) for the duration of the COVID-19 declaration under Section 564(b)(1) of the Act, 21 U.S.C.section 360bbb-3(b)(1), unless the authorization is terminated  or revoked sooner.       Influenza A by PCR NEGATIVE NEGATIVE  Final   Influenza B by PCR NEGATIVE NEGATIVE Final    Comment: (NOTE) The Xpert Xpress SARS-CoV-2/FLU/RSV  plus assay is intended as an aid in the diagnosis of influenza from Nasopharyngeal swab specimens and should not be used as a sole basis for treatment. Nasal washings and aspirates are unacceptable for Xpert Xpress SARS-CoV-2/FLU/RSV testing.  Fact Sheet for Patients: EntrepreneurPulse.com.au  Fact Sheet for Healthcare Providers: IncredibleEmployment.be  This test is not yet approved or cleared by the [place] FDA and has been authorized for detection and/or diagnosis of SARS-CoV-2 by FDA under an Emergency Use Authorization (EUA). This EUA will remain in effect (meaning this test can be used) for the duration of the COVID-19 declaration under Section 564(b)(1) of the Act, 21 U.S.C. section 360bbb-3(b)(1), unless the authorization is terminated or revoked.  Performed at [facility] [place] [address], [place], [place] [address]   Blood Culture (routine x 2)     Status: None (Preliminary result)   Collection Time: 03/12/21  2:12 PM   Specimen: BLOOD  Result Value Ref Range Status   Specimen Description   Final    BLOOD RIGHT ARM Performed at [facility] [address], [place], [place] [address]    Special Requests   Final    BOTTLES DRAWN AEROBIC AND ANAEROBIC Blood Culture adequate volume Performed at [facility] [address], [place], [place] [address]    Culture   Final    NO GROWTH 2 DAYS Performed at [facility] [address] [place], [place] [address]    Report Status PENDING  Incomplete  Blood Culture (routine x 2)     Status: None (Preliminary result)   Collection Time: 03/12/21  2:22 PM   Specimen: BLOOD  Result Value Ref Range Status   Specimen Description   Final    BLOOD RIGHT ANTECUBITAL Performed at [facility]  [address], [place], [place] [address]    Special Requests   Final    BOTTLES DRAWN AEROBIC AND ANAEROBIC Blood Culture adequate volume Performed at [facility] [address], [place], [place] [address]    Culture   Final    NO GROWTH 2 DAYS Performed at [facility] [address] [place], [place] [address]    Report Status PENDING  Incomplete      Radiology Studies: NM Pulmonary Perfusion  Result Date: 03/13/2021 CLINICAL DATA:  PE suspected EXAM: NUCLEAR MEDICINE PERFUSION LUNG SCAN TECHNIQUE: Perfusion images were obtained in multiple projections after intravenous injection of radiopharmaceutical. Ventilation scans intentionally deferred if perfusion scan and chest x-ray adequate for interpretation during COVID 19 epidemic. RADIOPHARMACEUTICALS:  4.4 mCi Tc-21mMAA IV COMPARISON:  Chest radiographs, 03/12/2021 FINDINGS: Normal, homogeneous perfusion of the lungs. No suspicious filling defect. IMPRESSION: Very low probability examination for pulmonary embolism by modified perfusion only PIOPED criteria (PE absent). Electronically Signed   By: AEddie CandleM.D.   On: 03/13/2021 12:50   DG Chest Port 1 View  Result Date: 03/12/2021 CLINICAL DATA:  55year old male with a history of cough EXAM: PORTABLE CHEST 1 VIEW COMPARISON:  05/01/2005 FINDINGS: Cardiomediastinal silhouette unchanged in size and contour. Prominence of the left vascular pedicle is similar to the comparison plain film. Interlobular septal thickening of the bilateral lungs. No pneumothorax. Blunting of the bilateral costophrenic angles. No displaced fracture IMPRESSION: Interlobular septal thickening with trace bilateral pleural effusions suggesting pulmonary edema. Atypical infection cannot be excluded. Electronically Signed   By: JCorrie MckusickD.O.   On: 03/12/2021 14:24   ECHOCARDIOGRAM COMPLETE  Result  Date: 03/13/2021    ECHOCARDIOGRAM REPORT   Patient Name:   Maxwell Grant Date of Exam:  03/13/2021 Medical Rec #:  [id number]      Height:       67.0 in Accession #:    [id number]     Weight:       203.0 lb Date of Birth:  08/30/1966       BSA:          2.035 m Patient Age:    61 years       BP:           91/64 mmHg Patient Gender: M              HR:           96 bpm. Exam Location:  Inpatient Procedure: 2D Echo, Cardiac Doppler and Color Doppler Indications:    I50.33 Acute on chronic diastolic (congestive) heart failure  History:        Patient has no prior history of Echocardiogram examinations.                 COPD; Risk Factors:Hypertension.  Sonographer:    Jonelle Sidle Dance Referring Phys: [id number] [place]  1. Left ventricular ejection fraction, by estimation, is 60 to 65%. The left ventricle has normal function. The left ventricle has no regional wall motion abnormalities. There is mild left ventricular hypertrophy. Left ventricular diastolic parameters were normal. Intermittent septal bounce  2. Right ventricular systolic function is normal. The right ventricular size is mildly enlarged. Tricuspid regurgitation signal is inadequate for assessing PA pressure.  3. The mitral valve is normal in structure. No evidence of mitral valve regurgitation. No evidence of mitral stenosis.  4. The aortic valve was not well visualized. Aortic valve regurgitation is not visualized. No aortic stenosis is present.  5. The inferior vena cava is dilated in size with >50% respiratory variability, suggesting right atrial pressure of 8 mmHg. FINDINGS  Left Ventricle: Left ventricular ejection fraction, by estimation, is 60 to 65%. The left ventricle has normal function. The left ventricle has no regional wall motion abnormalities. The left ventricular internal cavity size was normal in size. There is  mild left ventricular hypertrophy. Left ventricular diastolic parameters were normal. Right Ventricle: The right ventricular size is mildly enlarged. Right vetricular wall thickness was not well visualized.  Right ventricular systolic function is normal. Tricuspid regurgitation signal is inadequate for assessing PA pressure. Left Atrium: Left atrial size was normal in size. Right Atrium: Right atrial size was normal in size. Pericardium: There is no evidence of pericardial effusion. Mitral Valve: The mitral valve is normal in structure. No evidence of mitral valve regurgitation. No evidence of mitral valve stenosis. Tricuspid Valve: The tricuspid valve is normal in structure. Tricuspid valve regurgitation is trivial. Aortic Valve: The aortic valve was not well visualized. Aortic valve regurgitation is not visualized. No aortic stenosis is present. Pulmonic Valve: The pulmonic valve was not well visualized. Pulmonic valve regurgitation is not visualized. Aorta: The aortic root and ascending aorta are structurally normal, with no evidence of dilitation. Venous: The inferior vena cava is dilated in size with greater than 50% respiratory variability, suggesting right atrial pressure of 8 mmHg. IAS/Shunts: The interatrial septum was not well visualized.  LEFT VENTRICLE PLAX 2D LVIDd:         4.80 cm  Diastology LVIDs:         4.00 cm  LV e' medial:    9.25 cm/s  LV PW:         1.40 cm  LV E/e' medial:  11.5 LV IVS:        0.90 cm  LV e' lateral:   15.70 cm/s LVOT diam:     2.10 cm  LV E/e' lateral: 6.8 LV SV:         66 LV SV Index:   32 LVOT Area:     3.46 cm  RIGHT VENTRICLE             IVC RV Basal diam:  3.10 cm     IVC diam: 2.60 cm RV Mid diam:    2.60 cm RV S prime:     15.90 cm/s TAPSE (M-mode): 2.5 cm LEFT ATRIUM             Index       RIGHT ATRIUM           Index LA diam:        4.00 cm 1.97 cm/m  RA Area:     11.30 cm LA Vol (A2C):   57.7 ml 28.35 ml/m RA Volume:   22.40 ml  11.01 ml/m LA Vol (A4C):   52.4 ml 25.75 ml/m LA Biplane Vol: 55.2 ml 27.12 ml/m  AORTIC VALVE LVOT Vmax:   120.33 cm/s LVOT Vmean:  79.367 cm/s LVOT VTI:    0.190 m  AORTA Ao Root diam: 3.40 cm Ao Asc diam:  3.00 cm MITRAL VALVE MV  Area (PHT): 5.46 cm     SHUNTS MV Decel Time: 139 msec     Systemic VTI:  0.19 m MV E velocity: 106.50 cm/s  Systemic Diam: 2.10 cm MV A velocity: 103.00 cm/s MV E/A ratio:  1.03 Oswaldo Milian MD Electronically signed by Oswaldo Milian MD Signature Date/Time: 03/13/2021/3:00:47 PM    Final    VAS [place] LOWER EXTREMITY VENOUS (DVT)  Result Date: 03/13/2021  Lower Venous DVT Study Patient Name:  JOFFRE LUCKS  Date of Exam:   03/13/2021 Medical Rec #: [id number]       Accession #:    [id number] Date of Birth: 1966/03/23        Patient Gender: M Patient Age:   55Y Exam Location[facility]  [facility] Procedure:      VAS [place] LOWER EXTREMITY VENOUS (DVT) Referring Phys: [id number] Sharesa Kemp --------------------------------------------------------------------------------  Indications: SOB.  Comparison Study: No prior study Performing Technologist: Maudry Mayhew MHA, RDMS, RVT, RDCS  Examination Guidelines: A complete evaluation includes B-mode imaging, spectral Doppler, color Doppler, and power Doppler as needed of all accessible portions of each vessel. Bilateral testing is considered an integral part of a complete examination. Limited examinations for reoccurring indications may be performed as noted. The reflux portion of the exam is performed with the patient in reverse Trendelenburg.  +---------+---------------+---------+-----------+----------+--------------+ RIGHT    CompressibilityPhasicitySpontaneityPropertiesThrombus Aging +---------+---------------+---------+-----------+----------+--------------+ CFV      Full           Yes      Yes                                 +---------+---------------+---------+-----------+----------+--------------+ SFJ      Full                                                        +---------+---------------+---------+-----------+----------+--------------+  FV Prox  Full                                                         +---------+---------------+---------+-----------+----------+--------------+ FV Mid   Full                                                        +---------+---------------+---------+-----------+----------+--------------+ FV DistalFull                                                        +---------+---------------+---------+-----------+----------+--------------+ PFV      Full                                                        +---------+---------------+---------+-----------+----------+--------------+ POP      Full           Yes      Yes                                 +---------+---------------+---------+-----------+----------+--------------+ PTV      Full                                                        +---------+---------------+---------+-----------+----------+--------------+ PERO     Full                                                        +---------+---------------+---------+-----------+----------+--------------+   +---------+---------------+---------+-----------+----------+--------------+ LEFT     CompressibilityPhasicitySpontaneityPropertiesThrombus Aging +---------+---------------+---------+-----------+----------+--------------+ CFV      Full           Yes      Yes                                 +---------+---------------+---------+-----------+----------+--------------+ SFJ      Full                                                        +---------+---------------+---------+-----------+----------+--------------+ FV Prox  Full                                                        +---------+---------------+---------+-----------+----------+--------------+  FV Mid   Full                                                        +---------+---------------+---------+-----------+----------+--------------+ FV DistalFull                                                         +---------+---------------+---------+-----------+----------+--------------+ PFV      Full                                                        +---------+---------------+---------+-----------+----------+--------------+ POP      Full           Yes      Yes                                 +---------+---------------+---------+-----------+----------+--------------+ PTV      Full                                                        +---------+---------------+---------+-----------+----------+--------------+ PERO     Full                                                        +---------+---------------+---------+-----------+----------+--------------+     Summary: RIGHT: - There is no evidence of deep vein thrombosis in the lower extremity.  - No cystic structure found in the popliteal fossa.  LEFT: - There is no evidence of deep vein thrombosis in the lower extremity.  - No cystic structure found in the popliteal fossa.  Bilateral PTV and peroneal veins are dilated with rouleaux flow; cannot exclude future formation of thrombus.  *See table(s) above for measurements and observations. Electronically signed by Servando Snare MD on 03/13/2021 at 12:25:07 PM.    Final     Scheduled Meds:  arformoterol  15 mcg Nebulization BID   And   umeclidinium bromide  1 puff Inhalation Daily   atorvastatin  20 mg Oral Daily   chlorhexidine  15 mL Mouth Rinse BID   enoxaparin (LOVENOX) injection  40 mg Subcutaneous Q24H   guaiFENesin  600 mg Oral BID   insulin aspart  0-15 Units Subcutaneous TID WC   insulin aspart  0-5 Units Subcutaneous QHS   insulin glargine  20 Units Subcutaneous Daily   ipratropium-albuterol  3 mL Nebulization Q6H   mouth rinse  15 mL Mouth Rinse q12n4p   methylPREDNISolone (SOLU-MEDROL) injection  60 mg Intravenous Q12H   Continuous Infusions:  sodium chloride 100 mL/hr at 03/14/21 0343   azithromycin 500 mg (03/14/21 1043)   cefTRIAXone (ROCEPHIN)  IV  2 g (03/14/21  0855)     LOS: 2 days   Time spent: 29 minutes   Darliss Cheney, MD [facility]  03/14/2021, 1:49 PM   How to contact the [facility] Attending or Consulting provider [place] or covering provider during after hours [place], for this patient?  Check the care team in [facility] and look for a) attending/consulting [facility] provider listed and b) the [facility] team listed. Page or secure chat [phone]. Log into [email or web address] and use [place]'s universal password to access. If you do not have the password, please contact the hospital operator. Locate the [facility] provider you are looking for under [facility] and page to a number that you can be directly reached. If you still have difficulty reaching the provider, please page the [facility] (Director on Call) for the Hospitalists listed on amion for assistance.

## 2021-03-15 LAB — CBC WITH DIFFERENTIAL/PLATELET
Abs Immature Granulocytes: 0.06 10*3/uL (ref 0.00–0.07)
Basophils Absolute: 0 10*3/uL (ref 0.0–0.1)
Basophils Relative: 0 %
Eosinophils Absolute: 0 10*3/uL (ref 0.0–0.5)
Eosinophils Relative: 0 %
HCT: 55 % — ABNORMAL HIGH (ref 39.0–52.0)
Hemoglobin: 16.3 g/dL (ref 13.0–17.0)
Immature Granulocytes: 1 %
Lymphocytes Relative: 3 %
Lymphs Abs: 0.3 10*3/uL — ABNORMAL LOW (ref 0.7–4.0)
MCH: 31.7 pg (ref 26.0–34.0)
MCHC: 29.6 g/dL — ABNORMAL LOW (ref 30.0–36.0)
MCV: 106.8 fL — ABNORMAL HIGH (ref 80.0–100.0)
Monocytes Absolute: 0.5 10*3/uL (ref 0.1–1.0)
Monocytes Relative: 4 %
Neutro Abs: 11.6 10*3/uL — ABNORMAL HIGH (ref 1.7–7.7)
Neutrophils Relative %: 92 %
Platelets: 142 10*3/uL — ABNORMAL LOW (ref 150–400)
RBC: 5.15 MIL/uL (ref 4.22–5.81)
RDW: 17.1 % — ABNORMAL HIGH (ref 11.5–15.5)
WBC: 12.5 10*3/uL — ABNORMAL HIGH (ref 4.0–10.5)
nRBC: 0 % (ref 0.0–0.2)

## 2021-03-15 LAB — BLOOD GAS, ARTERIAL
Acid-Base Excess: 3.2 mmol/L — ABNORMAL HIGH (ref 0.0–2.0)
Bicarbonate: 32.7 mmol/L — ABNORMAL HIGH (ref 20.0–28.0)
O2 Content: 4 L/min
O2 Saturation: 90.3 %
Patient temperature: 98.6
pCO2 arterial: 70.2 mmHg (ref 32.0–48.0)
pH, Arterial: 7.29 — ABNORMAL LOW (ref 7.350–7.450)
pO2, Arterial: 63.6 mmHg — ABNORMAL LOW (ref 83.0–108.0)

## 2021-03-15 LAB — BASIC METABOLIC PANEL
Anion gap: 5 (ref 5–15)
BUN: 37 mg/dL — ABNORMAL HIGH (ref 6–20)
CO2: 33 mmol/L — ABNORMAL HIGH (ref 22–32)
Calcium: 9.2 mg/dL (ref 8.9–10.3)
Chloride: 106 mmol/L (ref 98–111)
Creatinine, Ser: 0.99 mg/dL (ref 0.61–1.24)
GFR, Estimated: >60 mL/min (ref >60)
Glucose, Bld: 230 mg/dL — ABNORMAL HIGH (ref 70–99)
Potassium: 4.5 mmol/L (ref 3.5–5.1)
Sodium: 144 mmol/L (ref 135–145)

## 2021-03-15 LAB — GLUCOSE, CAPILLARY
Glucose-Capillary: 231 mg/dL — ABNORMAL HIGH (ref 70–99)
Glucose-Capillary: 298 mg/dL — ABNORMAL HIGH (ref 70–99)
Glucose-Capillary: 249 mg/dL — ABNORMAL HIGH (ref 70–99)
Glucose-Capillary: 265 mg/dL — ABNORMAL HIGH (ref 70–99)

## 2021-03-15 MED ORDER — AMLODIPINE BESYLATE 10 MG PO TABS
10.0000 mg | ORAL_TABLET | Freq: Every day | ORAL | Status: DC
  Administered 2021-03-15 – 2021-03-18 (×4): 10 mg via ORAL
  Filled 2021-03-15 (×4): qty 1

## 2021-03-15 MED ORDER — GUAIFENESIN ER 600 MG PO TB12
600.0000 mg | ORAL_TABLET | Freq: Two times a day (BID) | ORAL | Status: DC
  Administered 2021-03-15 – 2021-03-18 (×6): 600 mg via ORAL
  Filled 2021-03-15 (×6): qty 1

## 2021-03-15 MED ORDER — HYDRALAZINE HCL 25 MG PO TABS
25.0000 mg | ORAL_TABLET | Freq: Four times a day (QID) | ORAL | Status: DC | PRN
  Administered 2021-03-16: 25 mg via ORAL
  Filled 2021-03-15: qty 1

## 2021-03-15 MED ORDER — INSULIN GLARGINE 100 UNIT/ML ~~LOC~~ SOLN
30.0000 [IU] | Freq: Every day | SUBCUTANEOUS | Status: DC
  Administered 2021-03-15 – 2021-03-16 (×2): 30 [IU] via SUBCUTANEOUS
  Filled 2021-03-15 (×2): qty 0.3

## 2021-03-15 NOTE — Evaluation (Signed)
Physical Therapy Evaluation Patient Details Name: Maxwell Grant MRN: [id number] DOB: 11-Oct-1965 Today's Date: 03/15/2021   History of Present Illness  55 yo male admitted with severe sepsis, Pna, respiratory failure, AKI. Hx of COPD, DM  Clinical Impression  On eval, pt was Supv for mobility. He walked ~250 feet x 2. O2 85% on 3-4L during session. Pt tolerated activity fairly well. He would like for therapy to continue to follow him during his hospital stay. He could also walk with nursing daily to increase activity and get him out of the room some.     Follow Up Recommendations  (possibly cardiopulmonary rehab if MD feels it is warranted.)    Equipment Recommendations       Recommendations for Other Services       Precautions / Restrictions Precautions Precautions: Fall Precaution Comments: monitor O2 Restrictions Weight Bearing Restrictions: No      Mobility  Bed Mobility Overal bed mobility: Modified Independent                  Transfers Overall transfer level: Modified independent                  Ambulation/Gait Ambulation/Gait assistance: Supervision Gait Distance (Feet): 250 Feet (x2) Assistive device: None Gait Pattern/deviations: Step-through pattern     General Gait Details: supv for safety. no lob. O2 85% on 3-4L. Dyspnea 2/4. Pt tolerated activity well. Seated rest break between walks.  Stairs            Wheelchair Mobility    Modified Rankin (Stroke Patients Only)       Balance Overall balance assessment: Mild deficits observed, not formally tested                                           Pertinent Vitals/Pain Pain Assessment: No/denies pain    Home Living Family/patient expects to be discharged to:: Private residence Living Arrangements: Children;Spouse/significant other   Type of Home: House Home Access: Stairs to enter   [employer] of Steps: 2 Home Layout: One level Home  Equipment: Environmental consultant - 2 wheels;Cane - single point      Prior Function Level of Independence: Independent               Hand Dominance        Extremity/Trunk Assessment   Upper Extremity Assessment Upper Extremity Assessment: Defer to OT evaluation    Lower Extremity Assessment Lower Extremity Assessment: Overall WFL for tasks assessed    Cervical / Trunk Assessment Cervical / Trunk Assessment: Normal  Communication   Communication: No difficulties  Cognition Arousal/Alertness: Awake/alert Behavior During Therapy: WFL for tasks assessed/performed Overall Cognitive Status: Within Functional Limits for tasks assessed                                 General Comments: tired of being in hospital. he's funny though.      General Comments      Exercises     Assessment/Plan    PT Assessment Patient needs continued PT services  PT Problem List Decreased activity tolerance       PT Treatment Interventions Gait training;Therapeutic exercise;Balance training;Therapeutic activities;Patient/family education;Functional mobility training    PT Goals (Current goals can be found in the Care Plan section)  Acute Rehab PT Goals Patient  Stated Goal: home! PT Goal Formulation: With patient Time For Goal Achievement: 03/29/21 Potential to Achieve Goals: Good    Frequency Min 3X/week   Barriers to discharge        Co-evaluation               AM-PAC PT "6 Clicks" Mobility  Outcome Measure Help needed turning from your back to your side while in a flat bed without using bedrails?: None Help needed moving from lying on your back to sitting on the side of a flat bed without using bedrails?: None Help needed moving to and from a bed to a chair (including a wheelchair)?: None Help needed standing up from a chair using your arms (e.g., wheelchair or bedside chair)?: None Help needed to walk in hospital room?: A Little Help needed climbing 3-5 steps with a  railing? : A Little 6 Click Score: 22    End of Session Equipment Utilized During Treatment: Oxygen Activity Tolerance: Patient tolerated treatment well Patient left: in bed;with call bell/phone within reach   PT Visit Diagnosis: Difficulty in walking, not elsewhere classified (R26.2)    Time: 8315-1761 PT Time Calculation (min) (ACUTE ONLY): 18 min   Charges:   PT Evaluation $PT Eval Low Complexity: 1 Low             Doreatha Massed, PT Acute Rehabilitation  Office: [phone] Pager: [phone]

## 2021-03-15 NOTE — Progress Notes (Signed)
Inpatient Diabetes Program Recommendations  AACE/ADA: New Consensus Statement on Inpatient Glycemic Control (2015)  Target Ranges:  Prepandial:   less than 140 mg/dL      Peak postprandial:   less than 180 mg/dL (1-2 hours)      Critically ill patients:  140 - 180 mg/dL   Lab Results  Component Value Date   GLUCAP 231 (H) 03/15/2021   HGBA1C 5.9 (H) 03/13/2021    Review of Glycemic Control  Diabetes history: DM2 Outpatient Diabetes medications: Amaryl 2 mg QD Current orders for Inpatient glycemic control: Lantus 30 units QD, Novolog 0-15 units TID with meals and 0-5 HS  HgbA1C 5.9  On Solumedrol 60 mg Q12H Post-prandials elevated with steroids. May benefit from addition of meal coverage insulin.  Inpatient Diabetes Program Recommendations:    Consider adding Novolog 3-4 units TID with meals if eating > 50%.  Will continue to follow.  Thank you. Lorenda Peck, RD, LDN, CDE Inpatient Diabetes Coordinator [phone]

## 2021-03-15 NOTE — Progress Notes (Signed)
PROGRESS NOTE    [name]  JXB:[id number] DOB: 05/24/1966 DOA: 03/12/2021 PCP: Merryl Hacker, No   Brief Narrative:  Maxwell Grant is a 55 y.o. male with medical history significant of HLD, COPD, HTN. Presented with dyspnea for 3 days associated with non-productive cough as well. His symptoms worsened through this morning, so he came to the ED for help. Of note, his wife was sick with bronchitis and a sinus infection this week. CXR was concerning for PNA.  Admitted with that as well as AKI.  Was then diagnosed with COPD exacerbation.  Assessment & Plan:   Active Problems:   Community acquired pneumonia   Severe sepsis ([place])   Respiratory acidosis   COPD with acute exacerbation ([place])   Respiratory failure with hypoxia and hypercapnia (HCC)   AKI (acute kidney injury) ([place])   Essential hypertension   Mixed hyperlipidemia   Type 2 diabetes mellitus (HCC)  Severe sepsis/acute hypoxic and hypercapnic respiratory failure secondary to community-acquired pneumonia/COPD exacerbation/respiratory acidosis: Met severe sepsis criteria based on tachycardia, tachypnea and hypoxia.  DVT and PE ruled out.  He claims that he used BiPAP all night long.  He also claims that his breathing is better but his lung exam is worse than yesterday.  More wheezy today.  He is very eager to get out of here.  We will repeat ABG.  Continue current antibiotics, bronchodilators and Solu-Medrol.    AKI: Resolved.  Creatinine normal now.  Continue to avoid nephrotoxic agents.  History of hypertension: Takes amlodipine, chlorthalidone and lisinopril at home but blood pressure was on the low side up until this morning.  Slightly elevated now.  Resume amlodipine and continue to hold chlorthalidone and lisinopril.  Add as needed hydralazine.  Type 2 diabetes mellitus: Takes glimepiride at home.  Hemoglobin A1c only 5.9 but hyperglycemic here likely secondary to Solu-Medrol.  Will increase Lantus to 30 units and continue  SSI.  Hyperlipidemia: Continue statin.  Tolerable abuse/dependence: Counseling to quit provided.  DVT prophylaxis: enoxaparin (LOVENOX) injection 40 mg Start: 03/13/21 1800   Code Status: Full Code  Family Communication: None present at bedside.  Plan of care discussed in length with patient.  He is very eager to get out.  I counseled him that he is not medically ready for discharge yet.  He is still on 5 L oxygen and has diffuse moderate to severe wheezes bilaterally.  Counseled him against leaving [facility].  Status is: Inpatient  Remains inpatient appropriate because:Hemodynamically unstable  Dispo: The patient is from: Home              Anticipated d/c is to: Home              Patient currently is not medically stable to d/c.   Difficult to place patient No        Estimated body mass index is 31.79 kg/m as calculated from the following:   Height as of this encounter: 5' 7" (1.702 m).   Weight as of this encounter: 92.1 kg.      Nutritional status:               Consultants:  None  Procedures:  None  Antimicrobials:  Anti-infectives (From admission, onward)    Start     Dose/Rate Route Frequency Ordered Stop   03/13/21 1000  cefTRIAXone (ROCEPHIN) 2 g in sodium chloride 0.9 % 100 mL IVPB        2 g 200 mL/hr over 30 Minutes Intravenous Every  24 hours 03/12/21 1736     03/13/21 1000  azithromycin (ZITHROMAX) 500 mg in sodium chloride 0.9 % 250 mL IVPB        500 mg 250 mL/hr over 60 Minutes Intravenous Every 24 hours 03/12/21 1736     03/12/21 1515  azithromycin (ZITHROMAX) 500 mg in sodium chloride 0.9 % 250 mL IVPB  Status:  Discontinued        500 mg 250 mL/hr over 60 Minutes Intravenous Every 24 hours 03/12/21 1502 03/12/21 1737   03/12/21 1515  cefTRIAXone (ROCEPHIN) 1 g in sodium chloride 0.9 % 100 mL IVPB  Status:  Discontinued        1 g 200 mL/hr over 30 Minutes Intravenous Every 24 hours 03/12/21 1502 03/12/21 1737           Subjective: Seen and examined.  Claims that his breathing is better.  Does not have any other complaint he wants to go home.  Objective: Vitals:   03/15/21 0320 03/15/21 0510 03/15/21 0515 03/15/21 0731  BP: (!) 141/74 (!) 145/93    Pulse: 94 97 94   Resp: _0 Temp:  98.2 F (36.8 C)    TempSrc:  Oral    SpO2: 94% 98% 100% 94%  Weight:      Height:        Intake/Output Summary (Last 24 hours) at 03/15/2021 1028 Last data filed at 03/15/2021 0600 Gross per 24 hour  Intake 2164.95 ml  Output 2551 ml  Net -386.05 ml    Filed Weights   03/12/21 1309 03/12/21 1451  Weight: 92.1 kg 92.1 kg    Examination:  General exam: Appears calm and comfortable  Respiratory system: Diffuse pan expiratory wheezes bilaterally, respiratory effort normal. Cardiovascular system: S1 & S2 heard, RRR. No JVD, murmurs, rubs, gallops or clicks. No pedal edema. Gastrointestinal system: Abdomen is nondistended, soft and nontender. No organomegaly or masses felt. Normal bowel sounds heard. Central nervous system: Alert and oriented. No focal neurological deficits. Extremities: Symmetric 5 x 5 power. Skin: No rashes, lesions or ulcers.  Psychiatry: Judgement and insight appear poor.   Data Reviewed: I have personally reviewed following labs and imaging studies  CBC: Recent Labs  Lab 03/12/21 1412 03/12/21 1802 03/13/21 0329 03/14/21 0318 03/15/21 0314  WBC 16.6* 15.7* 13.6* 8.4 12.5*  NEUTROABS 13.8* 12.8*  --  7.9* 11.6*  HGB 19.3* 18.9* 17.1* 16.8 16.3  HCT 59.0* 59.5* 55.9* 55.1* 55.0*  MCV 99.2 101.0* 103.3* 104.2* 106.8*  PLT 151 148* 137* 139* 142*    Basic Metabolic Panel: Recent Labs  Lab 03/12/21 1412 03/13/21 0329 03/14/21 0318 03/15/21 0314  NA 134* 137 140 144  K 4.3 4.4 4.3 4.5  CL 95* 97* 101 106  CO2 27 29 33* 33*  GLUCOSE 126* 132* 355* 230*  BUN 54* 60* 54* 37*  CREATININE 1.83* 1.73* 1.29* 0.99  CALCIUM 9.2 8.6* 9.2 9.2  MG  --   --  2.4  --      GFR: Estimated Creatinine Clearance: 89.1 mL/min (by C-G formula based on SCr of 0.99 mg/dL). Liver Function Tests: Recent Labs  Lab 03/12/21 1412 03/13/21 0329  AST 14* 13*  ALT 15 13  ALKPHOS 58 50  BILITOT 2.4* 1.9*  PROT 8.2* 7.1  ALBUMIN 4.3 3.5    No results for input(s): LIPASE, AMYLASE in the last 168 hours. No results for input(s): AMMONIA in the last 168 hours. Coagulation Profile: Recent Labs  Lab  03/12/21 1412 03/13/21 0329  INR 1.2 1.2    Cardiac Enzymes: No results for input(s): CKTOTAL, CKMB, CKMBINDEX, TROPONINI in the last 168 hours. BNP (last 3 results) No results for input(s): PROBNP in the last 8760 hours. HbA1C: Recent Labs    03/13/21 0839  HGBA1C 5.9*    CBG: Recent Labs  Lab 03/14/21 0758 03/14/21 1148 03/14/21 1636 03/14/21 2100 03/15/21 0755  GLUCAP 268* 330* 342* 196* 231*    Lipid Profile: No results for input(s): CHOL, HDL, LDLCALC, TRIG, CHOLHDL, LDLDIRECT in the last 72 hours. Thyroid Function Tests: No results for input(s): TSH, T4TOTAL, FREET4, T3FREE, THYROIDAB in the last 72 hours. Anemia Panel: No results for input(s): VITAMINB12, FOLATE, FERRITIN, TIBC, IRON, RETICCTPCT in the last 72 hours. Sepsis Labs: Recent Labs  Lab 03/12/21 1411 03/13/21 0329  PROCALCITON  --  4.05  LATICACIDVEN 1.0  --      Recent Results (from the past 240 hour(s))  Urine culture     Status: None   Collection Time: 03/12/21  1:22 PM   Specimen: Urine, Clean Catch  Result Value Ref Range Status   Specimen Description   Final    URINE, CLEAN CATCH Performed at [facility], [place] [address], [place], [place] [address]    Special Requests NONE  Final   Culture   Final    NO GROWTH Performed at [facility], [place] [address], [place], [place] [address]    Report Status 03/14/2021 FINAL  Final  Resp Panel by RT-PCR (Flu A&B, Covid) Nasopharyngeal Swab     Status: None   Collection Time: 03/12/21  2:11 PM    Specimen: Nasopharyngeal Swab; Nasopharyngeal(NP) swabs in vial transport medium  Result Value Ref Range Status   SARS Coronavirus 2 by RT PCR NEGATIVE NEGATIVE Final    Comment: (NOTE) SARS-CoV-2 target nucleic acids are NOT DETECTED.  The SARS-CoV-2 RNA is generally detectable in upper respiratory specimens during the acute phase of infection. The lowest concentration of SARS-CoV-2 viral copies this assay can detect is 138 copies/mL. A negative result does not preclude SARS-Cov-2 infection and should not be used as the sole basis for treatment or other patient management decisions. A negative result may occur with  improper specimen collection/handling, submission of specimen other than nasopharyngeal swab, presence of viral mutation(s) within the areas targeted by this assay, and inadequate number of viral copies(<138 copies/mL). A negative result must be combined with clinical observations, patient history, and epidemiological information. The expected result is Negative.  Fact Sheet for Patients:  EntrepreneurPulse.com.au  Fact Sheet for Healthcare Providers:  IncredibleEmployment.be  This test is no t yet approved or cleared by the [place] FDA and  has been authorized for detection and/or diagnosis of SARS-CoV-2 by FDA under an Emergency Use Authorization (EUA). This EUA will remain  in effect (meaning this test can be used) for the duration of the COVID-19 declaration under Section 564(b)(1) of the Act, 21 U.S.C.section 360bbb-3(b)(1), unless the authorization is terminated  or revoked sooner.       Influenza A by PCR NEGATIVE NEGATIVE Final   Influenza B by PCR NEGATIVE NEGATIVE Final    Comment: (NOTE) The Xpert Xpress SARS-CoV-2/FLU/RSV plus assay is intended as an aid in the diagnosis of influenza from Nasopharyngeal swab specimens and should not be used as a sole basis for treatment. Nasal washings and aspirates are  unacceptable for Xpert Xpress SARS-CoV-2/FLU/RSV testing.  Fact Sheet for Patients: EntrepreneurPulse.com.au  Fact Sheet for  Healthcare Providers: IncredibleEmployment.be  This test is not yet approved or cleared by the [place] and has been authorized for detection and/or diagnosis of SARS-CoV-2 by FDA under an Emergency Use Authorization (EUA). This EUA will remain in effect (meaning this test can be used) for the duration of the COVID-19 declaration under Section 564(b)(1) of the Act, 21 U.S.C. section 360bbb-3(b)(1), unless the authorization is terminated or revoked.  Performed at [facility], [place] [address], [place], [place] [address]   Blood Culture (routine x 2)     Status: None (Preliminary result)   Collection Time: 03/12/21  2:12 PM   Specimen: BLOOD  Result Value Ref Range Status   Specimen Description   Final    BLOOD RIGHT ARM Performed at [facility] [address] [place], [place] [address]    Special Requests   Final    BOTTLES DRAWN AEROBIC AND ANAEROBIC Blood Culture adequate volume Performed at [place] [address], [place], [place] [address]    Culture   Final    NO GROWTH 3 DAYS Performed at [facility], [place] [address], [place], [place] [address]    Report Status PENDING  Incomplete  Blood Culture (routine x 2)     Status: None (Preliminary result)   Collection Time: 03/12/21  2:22 PM   Specimen: BLOOD  Result Value Ref Range Status   Specimen Description   Final    BLOOD RIGHT ANTECUBITAL Performed at [place] [name], [place], [place] [address]    Special Requests   Final    BOTTLES DRAWN AEROBIC AND ANAEROBIC Blood Culture adequate volume Performed at [facility] [address] [place], [place] [address]    Culture   Final    NO GROWTH 3 DAYS Performed at [facility], [place] [address] [place],  [address]    Report Status PENDING  Incomplete       Radiology Studies: NM Pulmonary Perfusion  Result Date: 03/13/2021 CLINICAL DATA:  PE suspected EXAM: NUCLEAR MEDICINE PERFUSION LUNG SCAN TECHNIQUE: Perfusion images were obtained in multiple projections after intravenous injection of radiopharmaceutical. Ventilation scans intentionally deferred if perfusion scan and chest x-ray adequate for interpretation during COVID 19 epidemic. RADIOPHARMACEUTICALS:  4.4 mCi Tc-77mMAA IV COMPARISON:  Chest radiographs, 03/12/2021 FINDINGS: Normal, homogeneous perfusion of the lungs. No suspicious filling defect. IMPRESSION: Very low probability examination for pulmonary embolism by modified perfusion only PIOPED criteria (PE absent). Electronically Signed   By: AEddie CandleM.D.   On: 03/13/2021 12:50   ECHOCARDIOGRAM COMPLETE  Result Date: 03/13/2021    ECHOCARDIOGRAM REPORT   Patient Name:   Maxwell CHENIERDate of Exam: 03/13/2021 Medical Rec #:  [id number]     Height:       67.0 in Accession #:    [id number]    Weight:       203.0 lb Date of Birth:  [birth year]      BSA:          2.035 m Patient Age:    89years       BP:           91/64 mmHg Patient Gender: M              HR:           96 bpm. Exam Location:  Inpatient Procedure: 2D Echo, Cardiac Doppler and Color  Doppler Indications:    I50.33 Acute on chronic diastolic (congestive) heart failure  History:        Patient has no prior history of Echocardiogram examinations.                 COPD; Risk Factors:Hypertension.  Sonographer:    Jonelle Sidle Dance Referring Phys: [id number] RAVI [place]  1. Left ventricular ejection fraction, by estimation, is 60 to 65%. The left ventricle has normal function. The left ventricle has no regional wall motion abnormalities. There is mild left ventricular hypertrophy. Left ventricular diastolic parameters were normal. Intermittent septal bounce  2. Right ventricular  systolic function is normal. The right ventricular size is mildly enlarged. Tricuspid regurgitation signal is inadequate for assessing PA pressure.  3. The mitral valve is normal in structure. No evidence of mitral valve regurgitation. No evidence of mitral stenosis.  4. The aortic valve was not well visualized. Aortic valve regurgitation is not visualized. No aortic stenosis is present.  5. The inferior vena cava is dilated in size with >50% respiratory variability, suggesting right atrial pressure of 8 mmHg. FINDINGS  Left Ventricle: Left ventricular ejection fraction, by estimation, is 60 to 65%. The left ventricle has normal function. The left ventricle has no regional wall motion abnormalities. The left ventricular internal cavity size was normal in size. There is  mild left ventricular hypertrophy. Left ventricular diastolic parameters were normal. Right Ventricle: The right ventricular size is mildly enlarged. Right vetricular wall thickness was not well visualized. Right ventricular systolic function is normal. Tricuspid regurgitation signal is inadequate for assessing PA pressure. Left Atrium: Left atrial size was normal in size. Right Atrium: Right atrial size was normal in size. Pericardium: There is no evidence of pericardial effusion. Mitral Valve: The mitral valve is normal in structure. No evidence of mitral valve regurgitation. No evidence of mitral valve stenosis. Tricuspid Valve: The tricuspid valve is normal in structure. Tricuspid valve regurgitation is trivial. Aortic Valve: The aortic valve was not well visualized. Aortic valve regurgitation is not visualized. No aortic stenosis is present. Pulmonic Valve: The pulmonic valve was not well visualized. Pulmonic valve regurgitation is not visualized. Aorta: The aortic root and ascending aorta are structurally normal, with no evidence of dilitation. Venous: The inferior vena cava is dilated in size with greater than 50% respiratory variability,  suggesting right atrial pressure of 8 mmHg. IAS/Shunts: The interatrial septum was not well visualized.  LEFT VENTRICLE PLAX 2D LVIDd:         4.80 cm  Diastology LVIDs:         4.00 cm  LV e' medial:    9.25 cm/s LV PW:         1.40 cm  LV E/e' medial:  11.5 LV IVS:        0.90 cm  LV e' lateral:   15.70 cm/s LVOT diam:     2.10 cm  LV E/e' lateral: 6.8 LV SV:         66 LV SV Index:   32 LVOT Area:     3.46 cm  RIGHT VENTRICLE             IVC RV Basal diam:  3.10 cm     IVC diam: 2.60 cm RV Mid diam:    2.60 cm RV S prime:     15.90 cm/s TAPSE (M-mode): 2.5 cm LEFT ATRIUM             Index  RIGHT ATRIUM           Index LA diam:        4.00 cm 1.97 cm/m  RA Area:     11.30 cm LA Vol (A2C):   57.7 ml 28.35 ml/m RA Volume:   22.40 ml  11.01 ml/m LA Vol (A4C):   52.4 ml 25.75 ml/m LA Biplane Vol: 55.2 ml 27.12 ml/m  AORTIC VALVE LVOT Vmax:   120.33 cm/s LVOT Vmean:  79.367 cm/s LVOT VTI:    0.190 m  AORTA Ao Root diam: 3.40 cm Ao Asc diam:  3.00 cm MITRAL VALVE MV Area (PHT): 5.46 cm     SHUNTS MV Decel Time: 139 msec     Systemic VTI:  0.19 m MV E velocity: 106.50 cm/s  Systemic Diam: 2.10 cm MV A velocity: 103.00 cm/s MV E/A ratio:  1.03 Oswaldo Milian MD Electronically signed by Oswaldo Milian MD Signature Date/Time: 03/13/2021/3:00:47 PM    Final     Scheduled Meds:  arformoterol  15 mcg Nebulization BID   And   umeclidinium bromide  1 puff Inhalation Daily   atorvastatin  20 mg Oral Daily   chlorhexidine  15 mL Mouth Rinse BID   enoxaparin (LOVENOX) injection  40 mg Subcutaneous Q24H   guaiFENesin  600 mg Oral BID   insulin aspart  0-15 Units Subcutaneous TID WC   insulin aspart  0-5 Units Subcutaneous QHS   insulin glargine  30 Units Subcutaneous Daily   ipratropium-albuterol  3 mL Nebulization Q6H   mouth rinse  15 mL Mouth Rinse q12n4p   methylPREDNISolone (SOLU-MEDROL) injection  60 mg Intravenous Q12H   Continuous Infusions:  sodium chloride 100 mL/hr at 03/15/21  0203   azithromycin 500 mg (03/14/21 1043)   cefTRIAXone (ROCEPHIN)  IV 2 g (03/15/21 1003)     LOS: 3 days   Time spent: 28 minutes   Darliss Cheney, MD [facility]  03/15/2021, 10:28 AM   How to contact the [facility] Attending or Consulting provider [place] or covering provider during after hours [address], for this patient?  Check the care team in [facility] and look for a) attending/consulting [facility] provider listed and b) the [facility] team listed. Page or secure chat [phone]. Log into [email or web address] and use [place] universal password to access. If you do not have the password, please contact the hospital operator. Locate the [facility] provider you are looking for under [facility] and page to a number that you can be directly reached. If you still have difficulty reaching the provider, please page the [facility] (Director on Call) for the Hospitalists listed on amion for assistance.

## 2021-03-15 NOTE — Plan of Care (Signed)
Pt aox4, able to use Bipap for 7 hours thru the night, no c/o pain  & no s/s of respiratory distress.  Problem: Health Behavior/Discharge Planning: Goal: Ability to manage health-related needs will improve Outcome: Progressing   Problem: Clinical Measurements: Goal: Respiratory complications will improve Outcome: Progressing

## 2021-03-16 ENCOUNTER — Telehealth: Payer: Self-pay | Admitting: Acute Care

## 2021-03-16 ENCOUNTER — Inpatient Hospital Stay (HOSPITAL_COMMUNITY): Payer: Medicaid Other

## 2021-03-16 DIAGNOSIS — J441 Chronic obstructive pulmonary disease with (acute) exacerbation: Secondary | ICD-10-CM

## 2021-03-16 DIAGNOSIS — J9621 Acute and chronic respiratory failure with hypoxia: Secondary | ICD-10-CM

## 2021-03-16 DIAGNOSIS — J189 Pneumonia, unspecified organism: Secondary | ICD-10-CM

## 2021-03-16 DIAGNOSIS — J9622 Acute and chronic respiratory failure with hypercapnia: Secondary | ICD-10-CM

## 2021-03-16 LAB — BASIC METABOLIC PANEL
Anion gap: 5 (ref 5–15)
BUN: 31 mg/dL — ABNORMAL HIGH (ref 6–20)
CO2: 34 mmol/L — ABNORMAL HIGH (ref 22–32)
Calcium: 9.7 mg/dL (ref 8.9–10.3)
Chloride: 105 mmol/L (ref 98–111)
Creatinine, Ser: 0.97 mg/dL (ref 0.61–1.24)
GFR, Estimated: >60 mL/min (ref >60)
Glucose, Bld: 224 mg/dL — ABNORMAL HIGH (ref 70–99)
Potassium: 4.6 mmol/L (ref 3.5–5.1)
Sodium: 144 mmol/L (ref 135–145)

## 2021-03-16 LAB — BLOOD GAS, ARTERIAL
Acid-Base Excess: 7.5 mmol/L — ABNORMAL HIGH (ref 0.0–2.0)
Bicarbonate: 36.6 mmol/L — ABNORMAL HIGH (ref 20.0–28.0)
Drawn by: 29503
FIO2: 32
O2 Content: 3 L/min
O2 Saturation: 92.6 %
Patient temperature: 98.6
pCO2 arterial: 70.9 mmHg (ref 32.0–48.0)
pH, Arterial: 7.333 — ABNORMAL LOW (ref 7.350–7.450)
pO2, Arterial: 69 mmHg — ABNORMAL LOW (ref 83.0–108.0)

## 2021-03-16 LAB — GLUCOSE, CAPILLARY
Glucose-Capillary: 253 mg/dL — ABNORMAL HIGH (ref 70–99)
Glucose-Capillary: 113 mg/dL — ABNORMAL HIGH (ref 70–99)
Glucose-Capillary: 86 mg/dL (ref 70–99)
Glucose-Capillary: 159 mg/dL — ABNORMAL HIGH (ref 70–99)
Glucose-Capillary: 208 mg/dL — ABNORMAL HIGH (ref 70–99)

## 2021-03-16 LAB — CBC WITH DIFFERENTIAL/PLATELET
Abs Immature Granulocytes: 0.09 10*3/uL — ABNORMAL HIGH (ref 0.00–0.07)
Basophils Absolute: 0 10*3/uL (ref 0.0–0.1)
Basophils Relative: 0 %
Eosinophils Absolute: 0 10*3/uL (ref 0.0–0.5)
Eosinophils Relative: 0 %
HCT: 58 % — ABNORMAL HIGH (ref 39.0–52.0)
Hemoglobin: 17.4 g/dL — ABNORMAL HIGH (ref 13.0–17.0)
Immature Granulocytes: 1 %
Lymphocytes Relative: 5 %
Lymphs Abs: 0.6 10*3/uL — ABNORMAL LOW (ref 0.7–4.0)
MCH: 31.6 pg (ref 26.0–34.0)
MCHC: 30 g/dL (ref 30.0–36.0)
MCV: 105.3 fL — ABNORMAL HIGH (ref 80.0–100.0)
Monocytes Absolute: 0.6 10*3/uL (ref 0.1–1.0)
Monocytes Relative: 5 %
Neutro Abs: 11.3 10*3/uL — ABNORMAL HIGH (ref 1.7–7.7)
Neutrophils Relative %: 89 %
Platelets: 151 10*3/uL (ref 150–400)
RBC: 5.51 MIL/uL (ref 4.22–5.81)
RDW: 16.7 % — ABNORMAL HIGH (ref 11.5–15.5)
WBC: 12.6 10*3/uL — ABNORMAL HIGH (ref 4.0–10.5)
nRBC: 0 % (ref 0.0–0.2)

## 2021-03-16 LAB — BRAIN NATRIURETIC PEPTIDE: B Natriuretic Peptide: 232.7 pg/mL — ABNORMAL HIGH (ref 0.0–100.0)

## 2021-03-16 MED ORDER — FUROSEMIDE 10 MG/ML IJ SOLN
40.0000 mg | Freq: Two times a day (BID) | INTRAMUSCULAR | Status: AC
  Administered 2021-03-16 (×2): 40 mg via INTRAVENOUS
  Filled 2021-03-16 (×2): qty 4

## 2021-03-16 MED ORDER — INSULIN GLARGINE 100 UNIT/ML ~~LOC~~ SOLN
40.0000 [IU] | Freq: Every day | SUBCUTANEOUS | Status: DC
  Administered 2021-03-17 – 2021-03-18 (×2): 40 [IU] via SUBCUTANEOUS
  Filled 2021-03-16 (×2): qty 0.4

## 2021-03-16 MED ORDER — INSULIN GLARGINE 100 UNIT/ML ~~LOC~~ SOLN
10.0000 [IU] | Freq: Once | SUBCUTANEOUS | Status: DC
  Filled 2021-03-16: qty 0.1

## 2021-03-16 MED ORDER — LISINOPRIL 20 MG PO TABS
30.0000 mg | ORAL_TABLET | Freq: Every day | ORAL | Status: DC
  Administered 2021-03-16 – 2021-03-18 (×3): 30 mg via ORAL
  Filled 2021-03-16 (×3): qty 1

## 2021-03-16 MED ORDER — CHLORTHALIDONE 25 MG PO TABS
25.0000 mg | ORAL_TABLET | Freq: Every day | ORAL | Status: DC
  Administered 2021-03-16 – 2021-03-18 (×3): 25 mg via ORAL
  Filled 2021-03-16 (×3): qty 1

## 2021-03-16 MED ORDER — NICOTINE 14 MG/24HR TD PT24
14.0000 mg | MEDICATED_PATCH | Freq: Every day | TRANSDERMAL | Status: DC
  Filled 2021-03-16: qty 1

## 2021-03-16 NOTE — Consult Note (Signed)
NAME:  Maxwell Grant, MRN:  [id number], DOB:  03/22/1966, LOS: 4 ADMISSION DATE:  03/12/2021, CONSULTATION DATE:  03/16/2021 REFERRING MD:  Dr. Doristine Bosworth, CHIEF COMPLAINT:  Hypercapnic respiratory failure   History of Present Illness:  Maxwell Grant is a 55 y.o. male with a PMH significant for COPD, asthma, HTN, HLD, and type 2 diabetes who presented with complaints of dyspnea and non-productive cough. His wife was recently treated for bronchitis and sinus infection one week before his symptoms began.  Patient was admitted for acute COPD exacerbation and sepsis secondary to CAP.   PCCM consulted 6/29 for continued care  Pertinent  Medical History  COPD, asthma, HTN, HLD, and type 2 diabetes  Significant Hospital Events:  6/25 admitted for sepsis secondary to CAP and AECOPD  Interim History / Subjective:  Seen sitting up in bed stating he feels much improved and is questioning when discharge will be appropriate Family at bedside and updated  Objective   Blood pressure (!) 161/97, pulse 75, temperature 98.2 F (36.8 C), temperature source Oral, resp. rate (!) 24, height 5\' 7"  (1.702 m), weight 92.1 kg, SpO2 92 %.    FiO2 (%):  [40 %] 40 %   Intake/Output Summary (Last 24 hours) at 03/16/2021 1454 Last data filed at 03/16/2021 1300 Gross per 24 hour  Intake 1922.6 ml  Output 2350 ml  Net -427.4 ml   Filed Weights   03/12/21 1309 03/12/21 1451  Weight: 92.1 kg 92.1 kg    Examination: General: Middle-aged male sitting up in bed in no acute distress HEENT: [place]/AT, MM pink/moist, PERRL,  Neuro: Alert and oriented x3, nonfocal CV: s1s2 regular rate and rhythm, no murmur, rubs, or gallops,  PULM: Bilateral expiratory wheeze, no increased work of breathing, oxygen saturations 90 to 92% on 2 L nasal cannula GI: soft, bowel sounds active in all 4 quadrants, non-tender, non-distended, tolerating oral diet Extremities: warm/dry, no edema  Skin: no rashes or lesions  Resolved Hospital  Problem list     Assessment & Plan:  Acute on chronic hypoxic and hypercapnic respiratory failure  -Multiple blood gases obtained during admission thus far consistently show hypercapnia, no baseline ABD to compare  Acute excerebration  of COPD Current every day smoker  CAP with Sepsis on admission P: Acute Exacerbation of Chronic COPD  -DDX include Asthma, AECHF, Bronchiectasis, TB, or Bronchiolitis  P: Wean supplemental oxygen to maintain SpO2 of 88-92% Short acting bronchodilators, [place], and Incruse  Continue steroid therapy Completed 4 days of azithromycin and ceftriaxone BiPAP nightly Smoking cessation education provided Appropriate vaccinations prior to discharge  Patient need pulmonary follow up at discharge, will also need outpatient sleep study  Patient may benefit from NIV prior to discharge given persistent hypercapnia will consult TOC to assess candidacy    Best Practice   Diet/type: Regular consistency (see orders) DVT prophylaxis: LMWH GI prophylaxis: N/A Lines: N/A Foley:  N/A Code Status:  full code Last date of multidisciplinary goals of care discussion: pER PRIMARY  Labs   CBC: Recent Labs  Lab 03/12/21 1412 03/12/21 1802 03/13/21 0329 03/14/21 0318 03/15/21 0314 03/16/21 0352  WBC 16.6* 15.7* 13.6* 8.4 12.5* 12.6*  NEUTROABS 13.8* 12.8*  --  7.9* 11.6* 11.3*  HGB 19.3* 18.9* 17.1* 16.8 16.3 17.4*  HCT 59.0* 59.5* 55.9* 55.1* 55.0* 58.0*  MCV 99.2 101.0* 103.3* 104.2* 106.8* 105.3*  PLT 151 148* 137* 139* 142* 009    Basic Metabolic Panel: Recent Labs  Lab 03/12/21 1412 03/13/21 0329 03/14/21 2330  03/15/21 0314 03/16/21 0352  NA 134* 137 140 144 144  K 4.3 4.4 4.3 4.5 4.6  CL 95* 97* 101 106 105  CO2 27 29 33* 33* 34*  GLUCOSE 126* 132* 355* 230* 224*  BUN 54* 60* 54* 37* 31*  CREATININE 1.83* 1.73* 1.29* 0.99 0.97  CALCIUM 9.2 8.6* 9.2 9.2 9.7  MG  --   --  2.4  --   --    GFR: Estimated Creatinine Clearance: 90.9 mL/min (by  C-G formula based on SCr of 0.97 mg/dL). Recent Labs  Lab 03/12/21 1411 03/12/21 1412 03/13/21 0329 03/14/21 0318 03/15/21 0314 03/16/21 0352  PROCALCITON  --   --  4.05  --   --   --   WBC  --    < > 13.6* 8.4 12.5* 12.6*  LATICACIDVEN 1.0  --   --   --   --   --    < > = values in this interval not displayed.    Liver Function Tests: Recent Labs  Lab 03/12/21 1412 03/13/21 0329  AST 14* 13*  ALT 15 13  ALKPHOS 58 50  BILITOT 2.4* 1.9*  PROT 8.2* 7.1  ALBUMIN 4.3 3.5   No results for input(s): LIPASE, AMYLASE in the last 168 hours. No results for input(s): AMMONIA in the last 168 hours.  ABG    Component Value Date/Time   PHART 7.333 (L) 03/16/2021 0836   PCO2ART 70.9 (HH) 03/16/2021 0836   PO2ART 69.0 (L) 03/16/2021 0836   HCO3 36.6 (H) 03/16/2021 0836   ACIDBASEDEF 0.5 03/14/2021 0935   O2SAT 92.6 03/16/2021 0836     Coagulation Profile: Recent Labs  Lab 03/12/21 1412 03/13/21 0329  INR 1.2 1.2    Cardiac Enzymes: No results for input(s): CKTOTAL, CKMB, CKMBINDEX, TROPONINI in the last 168 hours.  HbA1C: Hgb A1c MFr Bld  Date/Time Value Ref Range Status  03/13/2021 08:39 AM 5.9 (H) 4.8 - 5.6 % Final    Comment:    (NOTE)         Prediabetes: 5.7 - 6.4         Diabetes: >6.4         Glycemic control for adults with diabetes: <7.0     CBG: Recent Labs  Lab 03/15/21 1622 03/15/21 2044 03/16/21 0746 03/16/21 1249 03/16/21 1423  GLUCAP 249* 265* 253* 113* 86    Review of Systems:   Please see the history of present illness. All other systems reviewed and are negative   Past Medical History:  He,  has a past medical history of COPD (chronic obstructive pulmonary disease) ([place]) and Hypertension.   Surgical History:  History reviewed. No pertinent surgical history.   Social History:   reports that he has been smoking cigarettes. He has been smoking an average of 1.00 packs per day. He has never used smokeless tobacco. He reports current  alcohol use of about 2.0 standard drinks of alcohol per week. He reports that he does not use drugs.   Family History:  His family history is not on file.   Allergies Allergies  Allergen Reactions   Aspirin Diarrhea and Nausea And Vomiting   Penicillins Diarrhea and Nausea And Vomiting     Home Medications  Prior to Admission medications   Medication Sig Start Date End Date Taking? Authorizing Provider  albuterol (VENTOLIN HFA) 108 (90 Base) MCG/ACT inhaler Inhale 1-2 puffs into the lungs every 4 (four) hours as needed for wheezing or shortness of breath.  03/30/17  Yes [provider]  amLODipine (NORVASC) 10 MG tablet Take 10 mg by mouth daily. 01/09/21  Yes [provider]  atorvastatin (LIPITOR) 20 MG tablet Take 20 mg by mouth daily. 03/28/16  Yes [provider]  chlorthalidone (HYGROTON) 25 MG tablet Take 25 mg by mouth daily. 12/14/20  Yes [provider]  glimepiride (AMARYL) 2 MG tablet Take 2 mg by mouth daily. 02/26/21  Yes [provider]  lisinopril (ZESTRIL) 30 MG tablet Take 30 mg by mouth daily. 02/15/21  Yes [provider]  Tiotropium Bromide-Olodaterol 2.5-2.5 MCG/ACT AERS Inhale 1 puff into the lungs daily. 05/10/20  Yes [provider]     Critical care time:  Angeliyah Kirkey D. Kenton Kingfisher, NP-C [facility] Personal contact information can be found on Amion  03/16/2021, 3:58 PM

## 2021-03-16 NOTE — Progress Notes (Signed)
Critical Value PCO2 of 70.9 called to Rn. Results given to Md with orders obtained.Eulas Post, RN

## 2021-03-16 NOTE — Evaluation (Signed)
Occupational Therapy Evaluation Patient Details Name: Shaquon Gropp MRN: [id number] DOB: 15-May-1966 Today's Date: 03/16/2021    History of Present Illness patient is a 55 year old male who was admitted on 6/25 with  3 day history of dyspnea and nonproductive cough. patient was found to have sepsis, pneumonia, and acute kidney injury. past medical history is significant for COPD, OSA, HTN and DM   Clinical Impression   Patient is a 55 year old man who was admitted with above diagnosis. Patient was living at home with family support PLOF with independence in ADLs and functional mobility. Patient is at baseline for ADL tasks at this time. Patient has no further OT needs at this time.     Follow Up Recommendations  No OT follow up    Equipment Recommendations  None recommended by OT    Recommendations for Other Services       Precautions / Restrictions Precautions Precautions: Fall Precaution Comments: monitor O2      Mobility Bed Mobility                    Transfers Overall transfer level: Modified independent                    Balance Overall balance assessment: Modified Independent                                         ADL either performed or assessed with clinical judgement   ADL Overall ADL's : Independent                                             Vision   Vision Assessment?: No apparent visual deficits                Pertinent Vitals/Pain Pain Assessment: No/denies pain     Hand Dominance Right   Extremity/Trunk Assessment Upper Extremity Assessment Upper Extremity Assessment: LUE deficits/detail;Overall [facility] for tasks assessed LUE Deficits / Details: patient was noted to have slight ROM deficit for L shoulder with report of having injury years prior. was able to tolerate resistance. LUE Sensation: WNL LUE Coordination: WNL   Lower Extremity Assessment Lower Extremity Assessment: Defer to PT  evaluation   Cervical / Trunk Assessment Cervical / Trunk Assessment: Normal   Communication Communication Communication: No difficulties   Cognition Arousal/Alertness: Awake/alert Behavior During Therapy: WFL for tasks assessed/performed Overall Cognitive Status: Within Functional Limits for tasks assessed                                 General Comments: patient expressed desires with leaving hospital at this time.                    Home Living Family/patient expects to be discharged to:: Private residence Living Arrangements: Children;Spouse/significant other Available Help at Discharge: Family;Available 24 hours/day Type of Home: House Home Access: Stairs to enter [employer] of Steps: 2   Home Layout: One level     Bathroom Shower/Tub: Teacher, early years/pre: Standard     Home Equipment: Environmental consultant - 2 wheels;Cane - single point   Additional Comments: patient did not use AE for functional  mobility      Prior Functioning/Environment Level of Independence: Independent                               OT Goals(Current goals can be found in the care plan section) Acute Rehab OT Goals OT Goal Formulation: All assessment and education complete, DC therapy                                 AM-PAC OT "6 Clicks" Daily Activity     Outcome Measure Help from another person eating meals?: None Help from another person taking care of personal grooming?: None Help from another person toileting, which includes using toliet, bedpan, or urinal?: None Help from another person bathing (including washing, rinsing, drying)?: None Help from another person to put on and taking off regular upper body clothing?: None Help from another person to put on and taking off regular lower body clothing?: None 6 Click Score: 24   End of Session Nurse Communication:  (nurse was in room during session educating significant  other)  Activity Tolerance: Patient tolerated treatment well Patient left: in chair;with family/visitor present (patients nurse was in room as well.)  OT Visit Diagnosis: Muscle weakness (generalized) (M62.81)                Time: 1045-1101 OT Time Calculation (min): 16 min Charges:  OT General Charges $OT Visit: 1 Visit OT Evaluation $OT Eval Low Complexity: 1 Low  Jackelyn Poling OTR/L, MS Acute Rehabilitation Department Office# [phone] Pager# [phone]   [place] 03/16/2021, 12:11 PM

## 2021-03-16 NOTE — Progress Notes (Addendum)
PROGRESS NOTE    Maxwell Grant  BWI:[id number] DOB: [birth year] DOA: 03/12/2021 PCP: Merryl Hacker, No   Brief Narrative:  Maxwell Grant is a 55 y.o. male with medical history significant of HLD, COPD, HTN. Presented with dyspnea for 3 days associated with non-productive cough as well. His symptoms worsened through this morning, so he came to the ED for help. Of note, his wife was sick with bronchitis and a sinus infection this week. CXR was concerning for PNA.  Admitted with that as well as AKI.  Was then diagnosed with COPD exacerbation.  Assessment & Plan:   Active Problems:   Community acquired pneumonia   Severe sepsis ([place])   Respiratory acidosis   COPD with acute exacerbation ([place])   Respiratory failure with hypoxia and hypercapnia (HCC)   AKI (acute kidney injury) ([place])   Essential hypertension   Mixed hyperlipidemia   Type 2 diabetes mellitus (HCC)  Severe sepsis/acute hypoxic and hypercapnic respiratory failure secondary to community-acquired pneumonia/COPD exacerbation/respiratory acidosis: Met severe sepsis criteria based on tachycardia, tachypnea and hypoxia.  DVT and PE ruled out.  Once again today, he claims that he is feeling better however he continues to have diffuse bilateral expiratory wheezes.  Still on 3 L of oxygen.  Used BiPAP all night long but ABGs once again shows hypercapnia with CO2 of 70 which is consistent with his previous ABGs here.  I will continue current antibiotics, bronchodilators as well as Solu-Medrol.  I had also added Robitussin and Mucinex.  Chest x-ray shows possible interstitial pulmonary edema.  Will start him on Lasix 40 mg IV twice daily.  Echo shows normal ejection fraction.  Obstructive sleep apnea: He tells me that he was prescribed CPAP long time ago but he does not use that at all.  It appears that he may have chronic hypoxia and hypercapnia.  I am concerned that if he were to discharge home without NIV at home, he will return back to the ED.  I  have consulted pulmonology today to come assess him and provide recommendations around need of NIV.  Patient very frustrated due to the fact that he is in the hospital.  He is very anxious and eager to go home.  I strongly advised him once again today that it will not be in his best interest to go home and I do not recommend going home and will not discharge him.  He understands that he has the right to sign AMA however I strongly discouraged him to do that.  At this point in time, he is willing to stay.  AKI: Resolved.  Creatinine normal now.  Continue to avoid nephrotoxic agents.  History of hypertension: Blood pressure elevated.  Continue amlodipine and resume chlorthalidone and lisinopril.  Continue as needed hydralazine.  Type 2 diabetes mellitus: Takes glimepiride at home.  Hemoglobin A1c only 5.9 but hyperglycemic here likely secondary to Solu-Medrol.  Will increase Lantus to 40 units.  He already received 30 units today, will add 10 units once and then starting tomorrow he will be on 40 units.  Hyperlipidemia: Continue statin.  Tolerable abuse/dependence: Counseling to quit provided.  He is not saying anything but perhaps he is anxious to leave because he wants to smoke.  I will order nicotine patch.  DVT prophylaxis: enoxaparin (LOVENOX) injection 40 mg Start: 03/13/21 1800   Code Status: Full Code  Family Communication: None present at bedside.  Plan of care discussed in length with patient.    Status is: Inpatient  Remains  inpatient appropriate because:Hemodynamically unstable  Dispo: The patient is from: Home              Anticipated d/c is to: Home              Patient currently is not medically stable to d/c.   Difficult to place patient No        Estimated body mass index is 31.79 kg/m as calculated from the following:   Height as of this encounter: _0  (1.702 m).   Weight as of this encounter: 92.1 kg.      Nutritional status:                Consultants:  PCCM  Procedures:  None  Antimicrobials:  Anti-infectives (From admission, onward)    Start     Dose/Rate Route Frequency Ordered Stop   03/13/21 1000  cefTRIAXone (ROCEPHIN) 2 g in sodium chloride 0.9 % 100 mL IVPB        2 g 200 mL/hr over 30 Minutes Intravenous Every 24 hours 03/12/21 1736     03/13/21 1000  azithromycin (ZITHROMAX) 500 mg in sodium chloride 0.9 % 250 mL IVPB        500 mg 250 mL/hr over 60 Minutes Intravenous Every 24 hours 03/12/21 1736     03/12/21 1515  azithromycin (ZITHROMAX) 500 mg in sodium chloride 0.9 % 250 mL IVPB  Status:  Discontinued        500 mg 250 mL/hr over 60 Minutes Intravenous Every 24 hours 03/12/21 1502 03/12/21 1737   03/12/21 1515  cefTRIAXone (ROCEPHIN) 1 g in sodium chloride 0.9 % 100 mL IVPB  Status:  Discontinued        1 g 200 mL/hr over 30 Minutes Intravenous Every 24 hours 03/12/21 1502 03/12/21 1737          Subjective: Seen and examined.  States that he is feeling better.  No new complaint.  He does look a little better compared to yesterday though.  Objective: Vitals:   03/16/21 0400 03/16/21 0407 03/16/21 0546 03/16/21 0838  BP: (!) 169/100  (!) 168/102   Pulse: 70  92   Resp: (!) 25 20    Temp: 98.2 F (36.8 C)     TempSrc: Oral     SpO2: 96%   93%  Weight:      Height:        Intake/Output Summary (Last 24 hours) at 03/16/2021 1118 Last data filed at 03/16/2021 0200 Gross per 24 hour  Intake 1446.6 ml  Output 500 ml  Net 946.6 ml    Filed Weights   03/12/21 1309 03/12/21 1451  Weight: 92.1 kg 92.1 kg    Examination:  General exam: Appears calm and comfortable, obese Respiratory system: Diffuse bilateral pan expiratory wheezes, respiratory effort normal. Cardiovascular system: S1 & S2 heard, RRR. No JVD, murmurs, rubs, gallops or clicks. No pedal edema. Gastrointestinal system: Abdomen is nondistended, soft and nontender. No organomegaly or masses felt. Normal bowel sounds  heard. Central nervous system: Alert and oriented. No focal neurological deficits. Extremities: Symmetric 5 x 5 power. Skin: No rashes, lesions or ulcers.  Psychiatry: Judgement and insight appear normal. Mood & affect appropriate.    Data Reviewed: I have personally reviewed following labs and imaging studies  CBC: Recent Labs  Lab 03/12/21 1412 03/12/21 1802 03/13/21 0329 03/14/21 0318 03/15/21 0314 03/16/21 0352  WBC 16.6* 15.7* 13.6* 8.4 12.5* 12.6*  NEUTROABS 13.8* 12.8*  --  7.9* 11.6*  11.3*  HGB 19.3* 18.9* 17.1* 16.8 16.3 17.4*  HCT 59.0* 59.5* 55.9* 55.1* 55.0* 58.0*  MCV 99.2 101.0* 103.3* 104.2* 106.8* 105.3*  PLT 151 148* 137* 139* 142* 193    Basic Metabolic Panel: Recent Labs  Lab 03/12/21 1412 03/13/21 0329 03/14/21 0318 03/15/21 0314 03/16/21 0352  NA 134* 137 140 144 144  K 4.3 4.4 4.3 4.5 4.6  CL 95* 97* 101 106 105  CO2 27 29 33* 33* 34*  GLUCOSE 126* 132* 355* 230* 224*  BUN 54* 60* 54* 37* 31*  CREATININE 1.83* 1.73* 1.29* 0.99 0.97  CALCIUM 9.2 8.6* 9.2 9.2 9.7  MG  --   --  2.4  --   --     GFR: Estimated Creatinine Clearance: 90.9 mL/min (by C-G formula based on SCr of 0.97 mg/dL). Liver Function Tests: Recent Labs  Lab 03/12/21 1412 03/13/21 0329  AST 14* 13*  ALT 15 13  ALKPHOS 58 50  BILITOT 2.4* 1.9*  PROT 8.2* 7.1  ALBUMIN 4.3 3.5    No results for input(s): LIPASE, AMYLASE in the last 168 hours. No results for input(s): AMMONIA in the last 168 hours. Coagulation Profile: Recent Labs  Lab 03/12/21 1412 03/13/21 0329  INR 1.2 1.2    Cardiac Enzymes: No results for input(s): CKTOTAL, CKMB, CKMBINDEX, TROPONINI in the last 168 hours. BNP (last 3 results) No results for input(s): PROBNP in the last 8760 hours. HbA1C: No results for input(s): HGBA1C in the last 72 hours.  CBG: Recent Labs  Lab 03/15/21 0755 03/15/21 1113 03/15/21 1622 03/15/21 2044 03/16/21 0746  GLUCAP 231* 298* 249* 265* 253*    Lipid  Profile: No results for input(s): CHOL, HDL, LDLCALC, TRIG, CHOLHDL, LDLDIRECT in the last 72 hours. Thyroid Function Tests: No results for input(s): TSH, T4TOTAL, FREET4, T3FREE, THYROIDAB in the last 72 hours. Anemia Panel: No results for input(s): VITAMINB12, FOLATE, FERRITIN, TIBC, IRON, RETICCTPCT in the last 72 hours. Sepsis Labs: Recent Labs  Lab 03/12/21 1411 03/13/21 0329  PROCALCITON  --  4.05  LATICACIDVEN 1.0  --      Recent Results (from the past 240 hour(s))  Urine culture     Status: None   Collection Time: 03/12/21  1:22 PM   Specimen: Urine, Clean Catch  Result Value Ref Range Status   Specimen Description   Final    URINE, CLEAN CATCH Performed at [facility], [place] [address], [place], [place] [address]    Special Requests NONE  Final   Culture   Final    NO GROWTH Performed at [facility], [place] [address], [place], [place] [address]    Report Status 03/14/2021 FINAL  Final  Resp Panel by RT-PCR (Flu A&B, Covid) Nasopharyngeal Swab     Status: None   Collection Time: 03/12/21  2:11 PM   Specimen: Nasopharyngeal Swab; Nasopharyngeal(NP) swabs in vial transport medium  Result Value Ref Range Status   SARS Coronavirus 2 by RT PCR NEGATIVE NEGATIVE Final    Comment: (NOTE) SARS-CoV-2 target nucleic acids are NOT DETECTED.  The SARS-CoV-2 RNA is generally detectable in upper respiratory specimens during the acute phase of infection. The lowest concentration of SARS-CoV-2 viral copies this assay can detect is 138 copies/mL. A negative result does not preclude SARS-Cov-2 infection and should not be used as the sole basis for treatment or other patient management decisions. A negative result may occur with  improper specimen collection/handling, submission of specimen other than nasopharyngeal  swab, presence of viral mutation(s) within the areas targeted by this assay, and inadequate number of viral copies(<138 copies/mL). A negative  result must be combined with clinical observations, patient history, and epidemiological information. The expected result is Negative.  Fact Sheet for Patients:  EntrepreneurPulse.com.au  Fact Sheet for Healthcare Providers:  IncredibleEmployment.be  This test is no t yet approved or cleared by the [place] FDA and  has been authorized for detection and/or diagnosis of SARS-CoV-2 by FDA under an Emergency Use Authorization (EUA). This EUA will remain  in effect (meaning this test can be used) for the duration of the COVID-19 declaration under Section 564(b)(1) of the Act, 21 U.S.C.section 360bbb-3(b)(1), unless the authorization is terminated  or revoked sooner.       Influenza A by PCR NEGATIVE NEGATIVE Final   Influenza B by PCR NEGATIVE NEGATIVE Final    Comment: (NOTE) The Xpert Xpress SARS-CoV-2/FLU/RSV plus assay is intended as an aid in the diagnosis of influenza from Nasopharyngeal swab specimens and should not be used as a sole basis for treatment. Nasal washings and aspirates are unacceptable for Xpert Xpress SARS-CoV-2/FLU/RSV testing.  Fact Sheet for Patients: EntrepreneurPulse.com.au  Fact Sheet for Healthcare Providers: IncredibleEmployment.be  This test is not yet approved or cleared by the [place] FDA and has been authorized for detection and/or diagnosis of SARS-CoV-2 by FDA under an Emergency Use Authorization (EUA). This EUA will remain in effect (meaning this test can be used) for the duration of the COVID-19 declaration under Section 564(b)(1) of the Act, 21 U.S.C. section 360bbb-3(b)(1), unless the authorization is terminated or revoked.  Performed at [facility], [place] [address] [place], [place] [address]   Blood Culture (routine x 2)     Status: None (Preliminary result)   Collection Time: 03/12/21  2:12 PM   Specimen: BLOOD  Result Value Ref  Range Status   Specimen Description   Final    BLOOD RIGHT ARM Performed at [place] [address], [place], [place] [address]    Special Requests   Final    BOTTLES DRAWN AEROBIC AND ANAEROBIC Blood Culture adequate volume Performed at [place] [address], [place], [place] [address]    Culture   Final    NO GROWTH 4 DAYS Performed at [facility], [place] [address] [place], [place] [address]    Report Status PENDING  Incomplete  Blood Culture (routine x 2)     Status: None (Preliminary result)   Collection Time: 03/12/21  2:22 PM   Specimen: BLOOD  Result Value Ref Range Status   Specimen Description   Final    BLOOD RIGHT ANTECUBITAL Performed at [place] [address] [place], [place] [address]    Special Requests   Final    BOTTLES DRAWN AEROBIC AND ANAEROBIC Blood Culture adequate volume Performed at [place] [address] [place], [place] [address]    Culture   Final    NO GROWTH 4 DAYS Performed at [facility], [place] [address] [place],  [address]    Report Status PENDING  Incomplete       Radiology Studies: DG CHEST PORT 1 VIEW  Result Date: 03/16/2021 CLINICAL DATA:  Sepsis.  Pneumonia.  Respiratory failure. EXAM: PORTABLE CHEST 1 VIEW COMPARISON:  03/12/2021.  05/01/2005. FINDINGS: Mediastinum hilar structures appear stable. Heart size stable. Persistent mild bilateral interstitial prominence consistent with interstitial edema and or  pneumonitis. Tiny bilateral pleural effusions again cannot be excluded. No pneumothorax. No acute bony abnormality. IMPRESSION: Persistent mild bilateral interstitial prominence consistent with interstitial edema and or pneumonitis. Tiny bilateral pleural effusions again cannot be excluded. Chest is unchanged from prior exam. Electronically Signed   By: Marcello Moores  Register   On: 03/16/2021 08:54    Scheduled Meds:   amLODipine  10 mg Oral Daily   arformoterol  15 mcg Nebulization BID   And   umeclidinium bromide  1 puff Inhalation Daily   atorvastatin  20 mg Oral Daily   chlorhexidine  15 mL Mouth Rinse BID   enoxaparin (LOVENOX) injection  40 mg Subcutaneous Q24H   furosemide  40 mg Intravenous BID   guaiFENesin  600 mg Oral BID   insulin aspart  0-15 Units Subcutaneous TID WC   insulin aspart  0-5 Units Subcutaneous QHS   insulin glargine  30 Units Subcutaneous Daily   ipratropium-albuterol  3 mL Nebulization Q6H   mouth rinse  15 mL Mouth Rinse q12n4p   methylPREDNISolone (SOLU-MEDROL) injection  60 mg Intravenous Q12H   Continuous Infusions:  azithromycin 500 mg (03/15/21 1035)   cefTRIAXone (ROCEPHIN)  IV 2 g (03/16/21 1116)     LOS: 4 days   Time spent: 35 minutes   Darliss Cheney, MD [facility]  03/16/2021, 11:18 AM   How to contact the [facility] Attending or Consulting provider [address] or covering provider during after hours [place], for this patient?  Check the care team in [facility] and look for a) attending/consulting TRH provider listed and b) the [facility] team listed. Page or secure chat [phone]. Log into [email or web address] and use [place]'s universal password to access. If you do not have the password, please contact the hospital operator. Locate the [facility] provider you are looking for under [facility] and page to a number that you can be directly reached. If you still have difficulty reaching the provider, please page the [facility] (Director on Call) for the Hospitalists listed on amion for assistance.

## 2021-03-16 NOTE — Telephone Encounter (Signed)
Scheduled with Dr. Halford Chessman 04/25/2021 at 11 am.  Nothing further needed.

## 2021-03-16 NOTE — Progress Notes (Signed)
Inpatient Diabetes Program Recommendations  AACE/ADA: New Consensus Statement on Inpatient Glycemic Control (2015)  Target Ranges:  Prepandial:   less than 140 mg/dL      Peak postprandial:   less than 180 mg/dL (1-2 hours)      Critically ill patients:  140 - 180 mg/dL   Lab Results  Component Value Date   GLUCAP 159 (H) 03/16/2021   HGBA1C 5.9 (H) 03/13/2021    Review of Glycemic Control  Diabetes history: DM2 Outpatient Diabetes medications: Amaryl 2 mg QD Current orders for Inpatient glycemic control: Lantus 40 units QD, Novolog 0-15 units TID with meals and 0-5 HS  HgbA1C - 5.9%  Spoke with pt at bedside regarding his hyperglycemia with steroids in the hospital. Pt does not want to go home on insulin. Blood sugars should improve when steroids are tapered down.  Inpatient Diabetes Program Recommendations:    Consider adding Novolog 4 units TID if eating > 50%. Consider decreasing Lantus to 34 units QD  Continue to follow.  Thank you. Lorenda Peck, RD, LDN, CDE Inpatient Diabetes Coordinator [phone]

## 2021-03-17 LAB — CBC WITH DIFFERENTIAL/PLATELET
Abs Immature Granulocytes: 0.05 10*3/uL (ref 0.00–0.07)
Basophils Absolute: 0 10*3/uL (ref 0.0–0.1)
Basophils Relative: 0 %
Eosinophils Absolute: 0 10*3/uL (ref 0.0–0.5)
Eosinophils Relative: 0 %
HCT: 58.7 % — ABNORMAL HIGH (ref 39.0–52.0)
Hemoglobin: 18.8 g/dL — ABNORMAL HIGH (ref 13.0–17.0)
Immature Granulocytes: 1 %
Lymphocytes Relative: 6 %
Lymphs Abs: 0.5 10*3/uL — ABNORMAL LOW (ref 0.7–4.0)
MCH: 31.7 pg (ref 26.0–34.0)
MCHC: 32 g/dL (ref 30.0–36.0)
MCV: 99 fL (ref 80.0–100.0)
Monocytes Absolute: 0.4 10*3/uL (ref 0.1–1.0)
Monocytes Relative: 5 %
Neutro Abs: 8 10*3/uL — ABNORMAL HIGH (ref 1.7–7.7)
Neutrophils Relative %: 88 %
Platelets: 145 10*3/uL — ABNORMAL LOW (ref 150–400)
RBC: 5.93 MIL/uL — ABNORMAL HIGH (ref 4.22–5.81)
RDW: 16.5 % — ABNORMAL HIGH (ref 11.5–15.5)
WBC: 9.1 10*3/uL (ref 4.0–10.5)
nRBC: 0 % (ref 0.0–0.2)

## 2021-03-17 LAB — GLUCOSE, CAPILLARY
Glucose-Capillary: 215 mg/dL — ABNORMAL HIGH (ref 70–99)
Glucose-Capillary: 272 mg/dL — ABNORMAL HIGH (ref 70–99)
Glucose-Capillary: 193 mg/dL — ABNORMAL HIGH (ref 70–99)
Glucose-Capillary: 122 mg/dL — ABNORMAL HIGH (ref 70–99)

## 2021-03-17 LAB — CULTURE, BLOOD (ROUTINE X 2)
Culture: NO GROWTH
Culture: NO GROWTH
Special Requests: ADEQUATE
Special Requests: ADEQUATE

## 2021-03-17 LAB — BRAIN NATRIURETIC PEPTIDE: B Natriuretic Peptide: 110.1 pg/mL — ABNORMAL HIGH (ref 0.0–100.0)

## 2021-03-17 LAB — BASIC METABOLIC PANEL
Anion gap: 10 (ref 5–15)
BUN: 29 mg/dL — ABNORMAL HIGH (ref 6–20)
CO2: 41 mmol/L — ABNORMAL HIGH (ref 22–32)
Calcium: 9.9 mg/dL (ref 8.9–10.3)
Chloride: 88 mmol/L — ABNORMAL LOW (ref 98–111)
Creatinine, Ser: 0.92 mg/dL (ref 0.61–1.24)
GFR, Estimated: >60 mL/min (ref >60)
Glucose, Bld: 172 mg/dL — ABNORMAL HIGH (ref 70–99)
Potassium: 3.9 mmol/L (ref 3.5–5.1)
Sodium: 139 mmol/L (ref 135–145)

## 2021-03-17 LAB — BLOOD GAS, ARTERIAL
Acid-Base Excess: 14.1 mmol/L — ABNORMAL HIGH (ref 0.0–2.0)
Bicarbonate: 41.2 mmol/L — ABNORMAL HIGH (ref 20.0–28.0)
Drawn by: 441371
O2 Content: 3.5 L/min
O2 Saturation: 88.5 %
Patient temperature: 98.6
pCO2 arterial: 55.4 mmHg — ABNORMAL HIGH (ref 32.0–48.0)
pH, Arterial: 7.484 — ABNORMAL HIGH (ref 7.350–7.450)
pO2, Arterial: 56.9 mmHg — ABNORMAL LOW (ref 83.0–108.0)

## 2021-03-17 NOTE — Progress Notes (Signed)
Physical Therapy Treatment and Discharge from Acute PT Patient Details Name: Maxwell Grant MRN: [id number] DOB: 17-May-1966 Today's Date: 03/17/2021    History of Present Illness patient is a 55 year old male who was admitted on 6/25 with  3 day history of dyspnea and nonproductive cough. patient was found to have sepsis, pneumonia, and acute kidney injury. past medical history is significant for COPD, OSA, HTN and DM    PT Comments    Pt standing in room looking out his window on arrival.  Pt very agreeable to ambulate in hallway and pushed his O2 tank.  SpO2 monitored and pt educated on current levels and O2 requirement.  Spo2 dropped to 84% on 3L O2 [place] during ambulation, so increased to 4L and SpO2 90%.  RN okay with pt ambulating in hallway independently.  Pt has met PT goals so will sign off.     Follow Up Recommendations   (cardiopulmonary rehab if/when MD agreeable)     Equipment Recommendations  None recommended by PT    Recommendations for Other Services       Precautions / Restrictions Precautions Precautions: None Precaution Comments: monitor O2    Mobility  Bed Mobility Overal bed mobility: Modified Independent                  Transfers Overall transfer level: Modified independent                  Ambulation/Gait Ambulation/Gait assistance: Supervision Gait Distance (Feet): 380 Feet Assistive device: None Gait Pattern/deviations: WFL(Within Functional Limits)     General Gait Details: pt pushed O2 tank, only cues for standing rest breaks for breathing, Spo2 dropped to 84% on 3L O2 [place] so increased to 4L and SpO2 90%   Stairs             Wheelchair Mobility    Modified Rankin (Stroke Patients Only)       Balance                                            Cognition Arousal/Alertness: Awake/alert Behavior During Therapy: WFL for tasks assessed/performed Overall Cognitive Status: Within Functional Limits for  tasks assessed                                        Exercises      General Comments        Pertinent Vitals/Pain Pain Assessment: No/denies pain    Home Living                      Prior Function            PT Goals (current goals can now be found in the care plan section) Progress towards PT goals: Goals met/education completed, patient discharged from PT    Frequency    Min 3X/week      PT Plan Current plan remains appropriate (d/c from acute PT)    Co-evaluation              AM-PAC PT "6 Clicks" Mobility   Outcome Measure  Help needed turning from your back to your side while in a flat bed without using bedrails?: None Help needed moving from lying on your back to sitting on  the side of a flat bed without using bedrails?: None Help needed moving to and from a bed to a chair (including a wheelchair)?: None Help needed standing up from a chair using your arms (e.g., wheelchair or bedside chair)?: None Help needed to walk in hospital room?: A Little Help needed climbing 3-5 steps with a railing? : A Little 6 Click Score: 22    End of Session Equipment Utilized During Treatment: Oxygen Activity Tolerance: Patient tolerated treatment well Patient left: in bed;with call bell/phone within reach Nurse Communication: Mobility status PT Visit Diagnosis: Difficulty in walking, not elsewhere classified (R26.2)     Time: 7793-9030 PT Time Calculation (min) (ACUTE ONLY): 12 min  Charges:  $Gait Training: 8-22 mins                    Arlyce Dice, DPT Acute Rehabilitation Services Pager: [phone] Office: [phone]    [place] Ram E 03/17/2021, 12:11 PM

## 2021-03-17 NOTE — Progress Notes (Signed)
PROGRESS NOTE    Maxwell Grant  KQA:[id number] DOB: 11/12/1965 DOA: 03/12/2021 PCP: Merryl Hacker, No   Brief Narrative:  Maxwell Grant is a 55 y.o. male with medical history significant of HLD, COPD, HTN. Presented with dyspnea for 3 days associated with non-productive cough as well. His symptoms worsened through this morning, so he came to the ED for help. Of note, his wife was sick with bronchitis and a sinus infection this week. CXR was concerning for PNA.  Admitted with that as well as AKI.  Was then diagnosed with COPD exacerbation.  Assessment & Plan:   Active Problems:   Community acquired pneumonia   Severe sepsis ([place])   Respiratory acidosis   COPD with acute exacerbation ([place])   Respiratory failure with hypoxia and hypercapnia (HCC)   AKI (acute kidney injury) ([place])   Essential hypertension   Mixed hyperlipidemia   Type 2 diabetes mellitus (HCC)  Severe sepsis/acute on chronic hypoxic and hypercapnic respiratory failure secondary to community-acquired pneumonia/COPD exacerbation/respiratory acidosis: Met severe sepsis criteria based on tachycardia, tachypnea and hypoxia.  DVT and PE ruled out.  He claims that he woke feels good only because he wants to get out of here, very anxious as usual.  Daughter at the bedside trying to counsel him as well.  Although clinically he overall looks better than yesterday, less dyspneic but today he has very obvious and visible cyanosis of the tip of the nose and lips.  ABG interestingly looks better with better CO2 and O2.  pH is rising as well.  He claims that he used CPAP at night all night long.  TOC has been consulted to help this patient get home NIV set up.  Patient remains at high risk for signing AMA but currently convinced to stay in the hospital.  We will continue current management for COPD exacerbation.  Very minimal wheezes, much improved compared to yesterday.  Completed 5 days of antibiotics on 03/16/2021.  Obstructive sleep apnea: He tells  me that he was prescribed CPAP long time ago but he does not use that at all.  It appears that he may have chronic hypoxia and hypercapnia.  PCCM saw him and they also favor NIV at home.  Although it may be very challenging as patient is very eager to leave and it might take several days to arrange home NIV.  AKI: Resolved.  Creatinine normal now.  Continue to avoid nephrotoxic agents.  History of hypertension: Blood pressure elevated.  Continue amlodipine and resume chlorthalidone and lisinopril.  Continue as needed hydralazine.  Type 2 diabetes mellitus: Takes glimepiride at home.  Hemoglobin A1c only 5.9 but hyperglycemic here likely secondary to Solu-Medrol.  Had an episode of hypoglycemia yesterday but hypoglycemic again today.  Received 40 units of Lantus this morning.  We will leave it on 40 for now and continue SSI.  Hyperlipidemia: Continue statin.  Tolerable abuse/dependence: Counseling to quit provided.  Continue nicotine patch.  DVT prophylaxis: enoxaparin (LOVENOX) injection 40 mg Start: 03/13/21 1800   Code Status: Full Code  Family Communication: Daughter present at bedside.  Plan of care discussed in length with patient and daughter in length.  Answered 24 questions that she had written out for me on a piece of paper.  Status is: Inpatient  Remains inpatient appropriate because:Hemodynamically unstable  Dispo: The patient is from: Home              Anticipated d/c is to: Home  Patient currently is not medically stable to d/c.   Difficult to place patient No        Estimated body mass index is 31.79 kg/m as calculated from the following:   Height as of this encounter: 5' 7"  (1.702 m).   Weight as of this encounter: 92.1 kg.      Nutritional status:               Consultants:  PCCM  Procedures:  None  Antimicrobials:  Anti-infectives (From admission, onward)    Start     Dose/Rate Route Frequency Ordered Stop   03/13/21 1000   cefTRIAXone (ROCEPHIN) 2 g in sodium chloride 0.9 % 100 mL IVPB        2 g 200 mL/hr over 30 Minutes Intravenous Every 24 hours 03/12/21 1736 03/16/21 1146   03/13/21 1000  azithromycin (ZITHROMAX) 500 mg in sodium chloride 0.9 % 250 mL IVPB        500 mg 250 mL/hr over 60 Minutes Intravenous Every 24 hours 03/12/21 1736 03/16/21 1400   03/12/21 1515  azithromycin (ZITHROMAX) 500 mg in sodium chloride 0.9 % 250 mL IVPB  Status:  Discontinued        500 mg 250 mL/hr over 60 Minutes Intravenous Every 24 hours 03/12/21 1502 03/12/21 1737   03/12/21 1515  cefTRIAXone (ROCEPHIN) 1 g in sodium chloride 0.9 % 100 mL IVPB  Status:  Discontinued        1 g 200 mL/hr over 30 Minutes Intravenous Every 24 hours 03/12/21 1502 03/12/21 1737          Subjective: Patient seen and examined.  Once again he claims that he feels better.  In fact he looks better as well however today he has a very obvious cyanosis of the tip of his nose and both lips.  Objective: Vitals:   03/16/21 2132 03/17/21 0606 03/17/21 0715 03/17/21 1113  BP: (!) 150/91 (!) 148/103  (!) 142/88  Pulse: 76 85 74 81  Resp: (!) 24 20 (!) 24 20  Temp: 98.3 F (36.8 C) 97.8 F (36.6 C)  98.6 F (37 C)  TempSrc: Oral Oral    SpO2: 91% (!) 89% 90% 91%  Weight:      Height:        Intake/Output Summary (Last 24 hours) at 03/17/2021 1307 Last data filed at 03/17/2021 1247 Gross per 24 hour  Intake 240 ml  Output 5625 ml  Net -5385 ml    Filed Weights   03/12/21 1309 03/12/21 1451  Weight: 92.1 kg 92.1 kg    Examination:  General exam: Appears calm and comfortable, obese Respiratory system: Minimal end expiratory wheezes bilaterally, respiratory effort normal. Cardiovascular system: S1 & S2 heard, RRR. No JVD, murmurs, rubs, gallops or clicks. No pedal edema. Gastrointestinal system: Abdomen is nondistended, soft and nontender. No organomegaly or masses felt. Normal bowel sounds heard. Central nervous system: Alert and  oriented. No focal neurological deficits. Extremities: Symmetric 5 x 5 power. Skin: No rashes, lesions or ulcers.  Psychiatry: Judgement and insight appear poor. Mood & affect appropriate.   Data Reviewed: I have personally reviewed following labs and imaging studies  CBC: Recent Labs  Lab 03/12/21 1802 03/13/21 0329 03/14/21 0318 03/15/21 0314 03/16/21 0352 03/17/21 0353  WBC 15.7* 13.6* 8.4 12.5* 12.6* 9.1  NEUTROABS 12.8*  --  7.9* 11.6* 11.3* 8.0*  HGB 18.9* 17.1* 16.8 16.3 17.4* 18.8*  HCT 59.5* 55.9* 55.1* 55.0* 58.0* 58.7*  MCV 101.0* 103.3*  104.2* 106.8* 105.3* 99.0  PLT 148* 137* 139* 142* 151 145*    Basic Metabolic Panel: Recent Labs  Lab 03/13/21 0329 03/14/21 0318 03/15/21 0314 03/16/21 0352 03/17/21 0353  NA 137 140 144 144 139  K 4.4 4.3 4.5 4.6 3.9  CL 97* 101 106 105 88*  CO2 29 33* 33* 34* 41*  GLUCOSE 132* 355* 230* 224* 172*  BUN 60* 54* 37* 31* 29*  CREATININE 1.73* 1.29* 0.99 0.97 0.92  CALCIUM 8.6* 9.2 9.2 9.7 9.9  MG  --  2.4  --   --   --     GFR: Estimated Creatinine Clearance: 95.9 mL/min (by C-G formula based on SCr of 0.92 mg/dL). Liver Function Tests: Recent Labs  Lab 03/12/21 1412 03/13/21 0329  AST 14* 13*  ALT 15 13  ALKPHOS 58 50  BILITOT 2.4* 1.9*  PROT 8.2* 7.1  ALBUMIN 4.3 3.5    No results for input(s): LIPASE, AMYLASE in the last 168 hours. No results for input(s): AMMONIA in the last 168 hours. Coagulation Profile: Recent Labs  Lab 03/12/21 1412 03/13/21 0329  INR 1.2 1.2    Cardiac Enzymes: No results for input(s): CKTOTAL, CKMB, CKMBINDEX, TROPONINI in the last 168 hours. BNP (last 3 results) No results for input(s): PROBNP in the last 8760 hours. HbA1C: No results for input(s): HGBA1C in the last 72 hours.  CBG: Recent Labs  Lab 03/16/21 1423 03/16/21 1634 03/16/21 2135 03/17/21 0817 03/17/21 1130  GLUCAP 86 159* 208* 215* 272*    Lipid Profile: No results for input(s): CHOL, HDL,  LDLCALC, TRIG, CHOLHDL, LDLDIRECT in the last 72 hours. Thyroid Function Tests: No results for input(s): TSH, T4TOTAL, FREET4, T3FREE, THYROIDAB in the last 72 hours. Anemia Panel: No results for input(s): VITAMINB12, FOLATE, FERRITIN, TIBC, IRON, RETICCTPCT in the last 72 hours. Sepsis Labs: Recent Labs  Lab 03/12/21 1411 03/13/21 0329  PROCALCITON  --  4.05  LATICACIDVEN 1.0  --      Recent Results (from the past 240 hour(s))  Urine culture     Status: None   Collection Time: 03/12/21  1:22 PM   Specimen: Urine, Clean Catch  Result Value Ref Range Status   Specimen Description   Final    URINE, CLEAN CATCH Performed at [facility], [place] [address], [place], [place] [address]    Special Requests NONE  Final   Culture   Final    NO GROWTH Performed at [facility], [place] [address] [place], [place] [address]    Report Status 03/14/2021 FINAL  Final  Resp Panel by RT-PCR (Flu A&B, Covid) Nasopharyngeal Swab     Status: None   Collection Time: 03/12/21  2:11 PM   Specimen: Nasopharyngeal Swab; Nasopharyngeal(NP) swabs in vial transport medium  Result Value Ref Range Status   SARS Coronavirus 2 by RT PCR NEGATIVE NEGATIVE Final    Comment: (NOTE) SARS-CoV-2 target nucleic acids are NOT DETECTED.  The SARS-CoV-2 RNA is generally detectable in upper respiratory specimens during the acute phase of infection. The lowest concentration of SARS-CoV-2 viral copies this assay can detect is 138 copies/mL. A negative result does not preclude SARS-Cov-2 infection and should not be used as the sole basis for treatment or other patient management decisions. A negative result may occur with  improper specimen collection/handling, submission of specimen other than nasopharyngeal swab, presence of viral mutation(s) within the areas targeted by this assay, and inadequate number of viral copies(<138 copies/mL). A negative  result must be combined with clinical  observations, patient history, and epidemiological information. The expected result is Negative.  Fact Sheet for Patients:  EntrepreneurPulse.com.au  Fact Sheet for Healthcare Providers:  IncredibleEmployment.be  This test is no t yet approved or cleared by the [place] FDA and  has been authorized for detection and/or diagnosis of SARS-CoV-2 by FDA under an Emergency Use Authorization (EUA). This EUA will remain  in effect (meaning this test can be used) for the duration of the COVID-19 declaration under Section 564(b)(1) of the Act, 21 U.S.C.section 360bbb-3(b)(1), unless the authorization is terminated  or revoked sooner.       Influenza A by PCR NEGATIVE NEGATIVE Final   Influenza B by PCR NEGATIVE NEGATIVE Final    Comment: (NOTE) The Xpert Xpress SARS-CoV-2/FLU/RSV plus assay is intended as an aid in the diagnosis of influenza from Nasopharyngeal swab specimens and should not be used as a sole basis for treatment. Nasal washings and aspirates are unacceptable for Xpert Xpress SARS-CoV-2/FLU/RSV testing.  Fact Sheet for Patients: EntrepreneurPulse.com.au  Fact Sheet for Healthcare Providers: IncredibleEmployment.be  This test is not yet approved or cleared by the [place] FDA and has been authorized for detection and/or diagnosis of SARS-CoV-2 by FDA under an Emergency Use Authorization (EUA). This EUA will remain in effect (meaning this test can be used) for the duration of the COVID-19 declaration under Section 564(b)(1) of the Act, 21 U.S.C. section 360bbb-3(b)(1), unless the authorization is terminated or revoked.  Performed at [facility], [place] [address], [place], [place] [address]   Blood Culture (routine x 2)     Status: None   Collection Time: 03/12/21  2:12 PM   Specimen: BLOOD  Result Value Ref Range Status   Specimen Description   Final    BLOOD RIGHT  ARM Performed at [place] [address], [place], [place] [address]    Special Requests   Final    BOTTLES DRAWN AEROBIC AND ANAEROBIC Blood Culture adequate volume Performed at [place] [address], [place], [place] [address]    Culture   Final    NO GROWTH 5 DAYS Performed at [facility], [place] [address], [place], [place] [address]    Report Status 03/17/2021 FINAL  Final  Blood Culture (routine x 2)     Status: None   Collection Time: 03/12/21  2:22 PM   Specimen: BLOOD  Result Value Ref Range Status   Specimen Description   Final    BLOOD RIGHT ANTECUBITAL Performed at [place] [address] [place], [place] [address]    Special Requests   Final    BOTTLES DRAWN AEROBIC AND ANAEROBIC Blood Culture adequate volume Performed at [place] [address], [place], [place] [address]    Culture   Final    NO GROWTH 5 DAYS Performed at [facility], [place] [address], [place],  [address]    Report Status 03/17/2021 FINAL  Final       Radiology Studies: DG CHEST PORT 1 VIEW  Result Date: 03/16/2021 CLINICAL DATA:  Sepsis.  Pneumonia.  Respiratory failure. EXAM: PORTABLE CHEST 1 VIEW COMPARISON:  03/12/2021.  05/01/2005. FINDINGS: Mediastinum hilar structures appear stable. Heart size stable. Persistent mild bilateral interstitial prominence consistent with interstitial edema and or pneumonitis. Tiny bilateral pleural effusions again cannot be excluded. No pneumothorax. No acute bony abnormality. IMPRESSION: Persistent mild bilateral interstitial prominence consistent with  interstitial edema and or pneumonitis. Tiny bilateral pleural effusions again cannot be excluded. Chest is unchanged from prior exam. Electronically Signed   By: Marcello Moores  Register   On: 03/16/2021 08:54    Scheduled Meds:  amLODipine  10 mg Oral Daily   arformoterol  15 mcg Nebulization BID   And    umeclidinium bromide  1 puff Inhalation Daily   atorvastatin  20 mg Oral Daily   chlorhexidine  15 mL Mouth Rinse BID   chlorthalidone  25 mg Oral Daily   enoxaparin (LOVENOX) injection  40 mg Subcutaneous Q24H   guaiFENesin  600 mg Oral BID   insulin aspart  0-15 Units Subcutaneous TID WC   insulin aspart  0-5 Units Subcutaneous QHS   insulin glargine  40 Units Subcutaneous Daily   ipratropium-albuterol  3 mL Nebulization Q6H   lisinopril  30 mg Oral Daily   mouth rinse  15 mL Mouth Rinse q12n4p   methylPREDNISolone (SOLU-MEDROL) injection  60 mg Intravenous Q12H   nicotine  14 mg Transdermal Daily   Continuous Infusions:     LOS: 5 days   Time spent: 30 minutes   Darliss Cheney, MD [facility]  03/17/2021, 1:07 PM   How to contact the [facility] Attending or Consulting provider [place] or covering provider during after hours [place], for this patient?  Check the care team in [facility] and look for a) attending/consulting [facility] provider listed and b) the [facility] team listed. Page or secure chat [phone]. Log into [email or web address] and use [place]'s universal password to access. If you do not have the password, please contact the hospital operator. Locate the [facility] provider you are looking for under [facility] and page to a number that you can be directly reached. If you still have difficulty reaching the provider, please page the [facility] (Director on Call) for the Hospitalists listed on amion for assistance.

## 2021-03-17 NOTE — Care Management (Signed)
Pt requires home NIV:  Maxwell Grant has Chronic hypoxic/hypercapnic respiratory failure, COPD. Pt requires frequent durations of respiratory support and deteriorates quickly in the absence of non-invasive mechanical ventilator. BIPAP has been considered but has been ruled-out and insufficient. Pt's PC02 was 70.9 on 03/16/2021. Interruption or failure to provide NIV would quickly lead to exacerbation of the patient's condition, lead to hospitalization and likely harm the patient or possibly death. Continued use of the NIV is preferred. Patient is able to maintain airway and clear secretions.

## 2021-03-18 ENCOUNTER — Other Ambulatory Visit (HOSPITAL_COMMUNITY): Payer: Self-pay

## 2021-03-18 DIAGNOSIS — G4733 Obstructive sleep apnea (adult) (pediatric): Secondary | ICD-10-CM

## 2021-03-18 LAB — CBC WITH DIFFERENTIAL/PLATELET
Abs Immature Granulocytes: 0.07 10*3/uL (ref 0.00–0.07)
Basophils Absolute: 0 10*3/uL (ref 0.0–0.1)
Basophils Relative: 0 %
Eosinophils Absolute: 0 10*3/uL (ref 0.0–0.5)
Eosinophils Relative: 0 %
HCT: 61.1 % — ABNORMAL HIGH (ref 39.0–52.0)
Hemoglobin: 19.9 g/dL — ABNORMAL HIGH (ref 13.0–17.0)
Immature Granulocytes: 1 %
Lymphocytes Relative: 7 %
Lymphs Abs: 0.7 10*3/uL (ref 0.7–4.0)
MCH: 31.4 pg (ref 26.0–34.0)
MCHC: 32.6 g/dL (ref 30.0–36.0)
MCV: 96.5 fL (ref 80.0–100.0)
Monocytes Absolute: 0.4 10*3/uL (ref 0.1–1.0)
Monocytes Relative: 4 %
Neutro Abs: 9.1 10*3/uL — ABNORMAL HIGH (ref 1.7–7.7)
Neutrophils Relative %: 88 %
Platelets: 181 10*3/uL (ref 150–400)
RBC: 6.33 MIL/uL — ABNORMAL HIGH (ref 4.22–5.81)
RDW: 17.1 % — ABNORMAL HIGH (ref 11.5–15.5)
WBC: 10.3 10*3/uL (ref 4.0–10.5)
nRBC: 0 % (ref 0.0–0.2)

## 2021-03-18 LAB — BASIC METABOLIC PANEL
Anion gap: 9 (ref 5–15)
BUN: 37 mg/dL — ABNORMAL HIGH (ref 6–20)
CO2: 36 mmol/L — ABNORMAL HIGH (ref 22–32)
Calcium: 10 mg/dL (ref 8.9–10.3)
Chloride: 91 mmol/L — ABNORMAL LOW (ref 98–111)
Creatinine, Ser: 0.99 mg/dL (ref 0.61–1.24)
GFR, Estimated: >60 mL/min (ref >60)
Glucose, Bld: 164 mg/dL — ABNORMAL HIGH (ref 70–99)
Potassium: 4.1 mmol/L (ref 3.5–5.1)
Sodium: 136 mmol/L (ref 135–145)

## 2021-03-18 LAB — GLUCOSE, CAPILLARY
Glucose-Capillary: 218 mg/dL — ABNORMAL HIGH (ref 70–99)
Glucose-Capillary: 367 mg/dL — ABNORMAL HIGH (ref 70–99)

## 2021-03-18 MED ORDER — BLOOD GLUCOSE MONITOR KIT
PACK | 0 refills | Status: DC
  Filled 2021-03-18 (×2): qty 1, 30d supply, fill #0

## 2021-03-18 MED ORDER — ACCU-CHEK GUIDE VI STRP: ORAL_STRIP | 0 refills | Status: DC

## 2021-03-18 MED ORDER — ACCU-CHEK FASTCLIX LANCETS MISC
0 refills | Status: AC
  Filled 2021-03-18: qty 102, 26d supply, fill #0

## 2021-03-18 MED ORDER — ACCU-CHEK GUIDE VI STRP
ORAL_STRIP | 0 refills | Status: DC
  Filled 2021-03-18: qty 100, 25d supply, fill #0

## 2021-03-18 MED ORDER — UMECLIDINIUM-VILANTEROL 62.5-25 MCG/INH IN AEPB
1.0000 | INHALATION_SPRAY | Freq: Every day | RESPIRATORY_TRACT | 0 refills | Status: DC
  Filled 2021-03-18: qty 60, 30d supply, fill #0

## 2021-03-18 MED ORDER — GUAIFENESIN ER 600 MG PO TB12
600.0000 mg | ORAL_TABLET | Freq: Two times a day (BID) | ORAL | 0 refills | Status: AC
  Filled 2021-03-18: qty 14, 7d supply, fill #0

## 2021-03-18 NOTE — Progress Notes (Signed)
AVS given to patient and explained at the bedside. Medications and follow up appointments have been explained with pt verbalizing understanding.  

## 2021-03-18 NOTE — Discharge Summary (Addendum)
Physician Discharge Summary  Kareen Hitsman YPP:[id number] DOB: 1965-12-27 DOA: 03/12/2021  PCP: Pcp, No  Admit date: 03/12/2021 Discharge date: 03/18/2021 30 Day Unplanned Readmission Risk Score    Flowsheet Row ED to Hosp-Admission (Current) from 03/12/2021 in [facility]  30 Day Unplanned Readmission Risk Score (%) 14.08 Filed at 03/18/2021 0801       This score is the patient's risk of an unplanned readmission within 30 days of being discharged (0 -100%). The score is based on dignosis, age, lab data, medications, orders, and past utilization.   Low:  0-14.9   Medium: 15-21.9   High: 22-29.9   Extreme: 30 and above           Admitted From: Home Disposition: Home  Recommendations for Outpatient Follow-up:  Follow up with PCP in 1-2 weeks Please follow-up with pulmonology for sleep study Please obtain BMP/CBC in one week Please follow up with your PCP on the following pending results: Unresulted Labs (From admission, onward)     Start     Ordered   03/14/21 0500  CBC with Differential/Platelet  Daily,   R      03/13/21 1134   03/12/21 1741  Legionella Pneumophila Serogp 1 Ur Ag  Once,   R        03/12/21 [place]: None Equipment/Devices: Oxygen cylinder  Discharge Condition: Stable CODE STATUS: Full code Diet recommendation: Consistent carbohydrate  Subjective: Seen and examined.  As usual, he states that he feels good and does not have any shortness of breath and wants to go home.  Brief/Interim Summary: Octaviano Mukai is a 55 y.o. male with medical history significant of HLD, COPD, HTN. Presented with dyspnea for 3 days associated with non-productive cough as well. His symptoms worsened so he came to the ED for help. Of note, his wife was sick with bronchitis and a sinus infection this week. CXR was concerning for PNA.  Admitted with severe sepsis due to pneumonia as well as AKI.  Was then diagnosed with  COPD exacerbation.  Further hospital course as below.  Severe sepsis/acute on chronic hypoxic and hypercapnic respiratory failure secondary to community-acquired pneumonia/COPD exacerbation/respiratory acidosis: Met severe sepsis criteria based on tachycardia, tachypnea and hypoxia.  DVT and PE ruled out.  ABG showed respiratory acidosis with hypoxia and hypercapnia.  Patient was started on IV Solu-Medrol, bronchodilators as well as Rocephin and Zithromax.  Patient started to improve however he continued to have hypercapnia on ABGs which indicated that patient likely has chronic hypoxia but this was never discovered or diagnosed.  Due to persistent hypercapnia, he was considered to be at high risk for discharge without NIV arrangements.  For further recommendations, PCCM was consulted and they also recommended continuing current management for pneumonia and COPD and also recommended discharge medications as well as recommended NIV at home when able.  They also recommended sleep study as outpatient as patient does have a history of sleep apnea and at one point in time, he was prescribed CPAP machine as well but he never used it.  For the past 2 days, patient has been more compliant with BiPAP machine and he has been using it almost all night and this has helped improving his overall situation and recent ABGs also showed improved CO2.  TOC was consulted and with their hardware, we are told today that he will get his NIV delivered at  home today.  Patient has been anxious to go home every day however he has remained in the hospital after extensive counseling by myself and his daughter who remains at the bedside every day.  Patient is being discharged today.  He completed 5 days of Rocephin and Zithromax and also received 6 days of IV Solu-Medrol so no further antibiotics or steroids needed.  He will be discharged on Anoro per PCCM recommendation.  He is highly advised to follow-up with PCP as well as establish  relationship with PCCM.  His PCP is from [place].  Ambulatory oximetry was checked and he was noted to require 2 L of oxygen with ambulation.  He is being sent home with home oxygen.   AKI: Resolved.  Creatinine normal now.  Nephrotoxic agents were avoided initially but then his lisinopril and chlorthalidone was resumed once his blood pressure started to climb up.  His renal function is normal now.   History of hypertension: Blood pressure elevated.  Continue amlodipine and resume chlorthalidone and lisinopril.  Continue as needed hydralazine.   Type 2 diabetes mellitus: Takes glimepiride at home.  Hemoglobin A1c only 5.9 but hyperglycemic here likely secondary to Solu-Medrol.  I do not expect hyperglycemia at home since he is not going to be on any steroids so I will resume his glimepiride.  I have also prescribed him a glucometer kit/lancets etc per his doctor's recommendations.   Hyperlipidemia: Continue statin.   Tolerable abuse/dependence: Extensive counseling regarding quitting smoking has been provided to the patient.  Although he states that he has quitted smoking but I doubt that he would stick to his commitment.  He has been advised not to smoke while using oxygen.  Discharge Diagnoses:  Active Problems:   Community acquired pneumonia   Severe sepsis ([place])   Respiratory acidosis   COPD with acute exacerbation ([place])   Respiratory failure with hypoxia and hypercapnia (H[facility])   AKI (acute kidney injury) ([place])   Essential hypertension   Mixed hyperlipidemia   Type 2 diabetes mellitus (HCC)   OSA (obstructive sleep apnea)    Discharge Instructions  Discharge Instructions     Ambulatory referral to Sleep Studies   Complete by: As directed    For home use only DME Glucometer   Complete by: As directed       Allergies as of 03/18/2021       Reactions   Aspirin Diarrhea, Nausea And Vomiting   Penicillins Diarrhea, Nausea And Vomiting        Medication List      TAKE these medications    Accu-Chek FastClix Lancets Misc Use to test blood sugar 4 times a day   Accu-Chek Guide test strip Generic drug: glucose blood Use to test blood sugar 4 times a day   albuterol 108 (90 Base) MCG/ACT inhaler Commonly known as: VENTOLIN HFA Inhale 1-2 puffs into the lungs every 4 (four) hours as needed for wheezing or shortness of breath.   amLODipine 10 MG tablet Commonly known as: NORVASC Take 10 mg by mouth daily.   atorvastatin 20 MG tablet Commonly known as: LIPITOR Take 20 mg by mouth daily.   blood glucose meter kit and supplies Kit Use to test blood sugar 4 times a day   chlorthalidone 25 MG tablet Commonly known as: HYGROTON Take 25 mg by mouth daily.   glimepiride 2 MG tablet Commonly known as: AMARYL Take 2 mg by mouth daily.   guaiFENesin 600 MG 12 hr tablet Commonly known  as: MUCINEX Take 1 tablet (600 mg total) by mouth 2 (two) times daily for 7 days.   lisinopril 30 MG tablet Commonly known as: ZESTRIL Take 30 mg by mouth daily.   Tiotropium Bromide-Olodaterol 2.5-2.5 MCG/ACT Aers Inhale 1 puff into the lungs daily.   umeclidinium-vilanterol 62.5-25 MCG/INH Aepb Commonly known as: ANORO ELLIPTA Inhale 1 puff into the lungs daily.               Durable Medical Equipment  (From admission, onward)           Start     Ordered   03/18/21 1032  For home use only DME oxygen  Once       Question Answer Comment  Length of Need Lifetime   Liters per Minute 2   Frequency Continuous (stationary and portable oxygen unit needed)   Oxygen delivery system Gas      03/18/21 1031   03/18/21 0000  For home use only DME Glucometer        03/18/21 1120            Allergies  Allergen Reactions   Aspirin Diarrhea and Nausea And Vomiting   Penicillins Diarrhea and Nausea And Vomiting    Consultations: PCCM   Procedures/Studies: NM Pulmonary Perfusion  Result Date: 03/13/2021 CLINICAL DATA:  PE suspected  EXAM: NUCLEAR MEDICINE PERFUSION LUNG SCAN TECHNIQUE: Perfusion images were obtained in multiple projections after intravenous injection of radiopharmaceutical. Ventilation scans intentionally deferred if perfusion scan and chest x-ray adequate for interpretation during COVID 19 epidemic. RADIOPHARMACEUTICALS:  4.4 mCi Tc-72mMAA IV COMPARISON:  Chest radiographs, 03/12/2021 FINDINGS: Normal, homogeneous perfusion of the lungs. No suspicious filling defect. IMPRESSION: Very low probability examination for pulmonary embolism by modified perfusion only PIOPED criteria (PE absent). Electronically Signed   By: AEddie CandleM.D.   On: 03/13/2021 12:50   DG CHEST PORT 1 VIEW  Result Date: 03/16/2021 CLINICAL DATA:  Sepsis.  Pneumonia.  Respiratory failure. EXAM: PORTABLE CHEST 1 VIEW COMPARISON:  03/12/2021.  05/01/2005. FINDINGS: Mediastinum hilar structures appear stable. Heart size stable. Persistent mild bilateral interstitial prominence consistent with interstitial edema and or pneumonitis. Tiny bilateral pleural effusions again cannot be excluded. No pneumothorax. No acute bony abnormality. IMPRESSION: Persistent mild bilateral interstitial prominence consistent with interstitial edema and or pneumonitis. Tiny bilateral pleural effusions again cannot be excluded. Chest is unchanged from prior exam. Electronically Signed   By: TMarcello Moores Register   On: 03/16/2021 08:54   DG Chest Port 1 View  Result Date: 03/12/2021 CLINICAL DATA:  55year old male with a history of cough EXAM: PORTABLE CHEST 1 VIEW COMPARISON:  05/01/2005 FINDINGS: Cardiomediastinal silhouette unchanged in size and contour. Prominence of the left vascular pedicle is similar to the comparison plain film. Interlobular septal thickening of the bilateral lungs. No pneumothorax. Blunting of the bilateral costophrenic angles. No displaced fracture IMPRESSION: Interlobular septal thickening with trace bilateral pleural effusions suggesting pulmonary  edema. Atypical infection cannot be excluded. Electronically Signed   By: JCorrie MckusickD.O.   On: 03/12/2021 14:24   ECHOCARDIOGRAM COMPLETE  Result Date: 03/13/2021    ECHOCARDIOGRAM REPORT   Patient Name:   JZOHAR MARONEYDate of Exam: 03/13/2021 Medical Rec #:  [id number]     Height:       67.0 in Accession #:    [id number]    Weight:       203.0 lb Date of Birth:  [birth year]      BSA:  2.035 m Patient Age:    2 years       BP:           91/64 mmHg Patient Gender: M              HR:           96 bpm. Exam Location:  Inpatient Procedure: 2D Echo, Cardiac Doppler and Color Doppler Indications:    I50.33 Acute on chronic diastolic (congestive) heart failure  History:        Patient has no prior history of Echocardiogram examinations.                 COPD; Risk Factors:Hypertension.  Sonographer:    Jonelle Sidle Dance Referring Phys: [id number] Keyion Knack [place]  1. Left ventricular ejection fraction, by estimation, is 60 to 65%. The left ventricle has normal function. The left ventricle has no regional wall motion abnormalities. There is mild left ventricular hypertrophy. Left ventricular diastolic parameters were normal. Intermittent septal bounce  2. Right ventricular systolic function is normal. The right ventricular size is mildly enlarged. Tricuspid regurgitation signal is inadequate for assessing PA pressure.  3. The mitral valve is normal in structure. No evidence of mitral valve regurgitation. No evidence of mitral stenosis.  4. The aortic valve was not well visualized. Aortic valve regurgitation is not visualized. No aortic stenosis is present.  5. The inferior vena cava is dilated in size with >50% respiratory variability, suggesting right atrial pressure of 8 mmHg. FINDINGS  Left Ventricle: Left ventricular ejection fraction, by estimation, is 60 to 65%. The left ventricle has normal function. The left ventricle has no regional wall motion abnormalities. The left ventricular internal cavity  size was normal in size. There is  mild left ventricular hypertrophy. Left ventricular diastolic parameters were normal. Right Ventricle: The right ventricular size is mildly enlarged. Right vetricular wall thickness was not well visualized. Right ventricular systolic function is normal. Tricuspid regurgitation signal is inadequate for assessing PA pressure. Left Atrium: Left atrial size was normal in size. Right Atrium: Right atrial size was normal in size. Pericardium: There is no evidence of pericardial effusion. Mitral Valve: The mitral valve is normal in structure. No evidence of mitral valve regurgitation. No evidence of mitral valve stenosis. Tricuspid Valve: The tricuspid valve is normal in structure. Tricuspid valve regurgitation is trivial. Aortic Valve: The aortic valve was not well visualized. Aortic valve regurgitation is not visualized. No aortic stenosis is present. Pulmonic Valve: The pulmonic valve was not well visualized. Pulmonic valve regurgitation is not visualized. Aorta: The aortic root and ascending aorta are structurally normal, with no evidence of dilitation. Venous: The inferior vena cava is dilated in size with greater than 50% respiratory variability, suggesting right atrial pressure of 8 mmHg. IAS/Shunts: The interatrial septum was not well visualized.  LEFT VENTRICLE PLAX 2D LVIDd:         4.80 cm  Diastology LVIDs:         4.00 cm  LV e' medial:    9.25 cm/s LV PW:         1.40 cm  LV E/e' medial:  11.5 LV IVS:        0.90 cm  LV e' lateral:   15.70 cm/s LVOT diam:     2.10 cm  LV E/e' lateral: 6.8 LV SV:         66 LV SV Index:   32 LVOT Area:     3.46 cm  RIGHT VENTRICLE  IVC RV Basal diam:  3.10 cm     IVC diam: 2.60 cm RV Mid diam:    2.60 cm RV S prime:     15.90 cm/s TAPSE (M-mode): 2.5 cm LEFT ATRIUM             Index       RIGHT ATRIUM           Index LA diam:        4.00 cm 1.97 cm/m  RA Area:     11.30 cm LA Vol (A2C):   57.7 ml 28.35 ml/m RA Volume:   22.40  ml  11.01 ml/m LA Vol (A4C):   52.4 ml 25.75 ml/m LA Biplane Vol: 55.2 ml 27.12 ml/m  AORTIC VALVE LVOT Vmax:   120.33 cm/s LVOT Vmean:  79.367 cm/s LVOT VTI:    0.190 m  AORTA Ao Root diam: 3.40 cm Ao Asc diam:  3.00 cm MITRAL VALVE MV Area (PHT): 5.46 cm     SHUNTS MV Decel Time: 139 msec     Systemic VTI:  0.19 m MV E velocity: 106.50 cm/s  Systemic Diam: 2.10 cm MV A velocity: 103.00 cm/s MV E/A ratio:  1.03 Oswaldo Milian MD Electronically signed by Oswaldo Milian MD Signature Date/Time: 03/13/2021/3:00:47 PM    Final    VAS [place] LOWER EXTREMITY VENOUS (DVT)  Result Date: 03/13/2021  Lower Venous DVT Study Patient Name:  RAUN ROUTH  Date of Exam:   03/13/2021 Medical Rec #: [id number]       Accession #:    [id number] Date of Birth: [birth year]        Patient Gender: M Patient Age:   55Y Exam Location:  [facility] Procedure:      VAS [place] LOWER EXTREMITY VENOUS (DVT) Referring Phys: [id number] Elzie Sheets --------------------------------------------------------------------------------  Indications: SOB.  Comparison Study: No prior study Performing Technologist: Maudry Mayhew MHA, RDMS, RVT, RDCS  Examination Guidelines: A complete evaluation includes B-mode imaging, spectral Doppler, color Doppler, and power Doppler as needed of all accessible portions of each vessel. Bilateral testing is considered an integral part of a complete examination. Limited examinations for reoccurring indications may be performed as noted. The reflux portion of the exam is performed with the patient in reverse Trendelenburg.  +---------+---------------+---------+-----------+----------+--------------+ RIGHT    CompressibilityPhasicitySpontaneityPropertiesThrombus Aging +---------+---------------+---------+-----------+----------+--------------+ CFV      Full           Yes      Yes                                 +---------+---------------+---------+-----------+----------+--------------+ SFJ       Full                                                        +---------+---------------+---------+-----------+----------+--------------+ FV Prox  Full                                                        +---------+---------------+---------+-----------+----------+--------------+ FV Mid   Full                                                        +---------+---------------+---------+-----------+----------+--------------+  FV DistalFull                                                        +---------+---------------+---------+-----------+----------+--------------+ PFV      Full                                                        +---------+---------------+---------+-----------+----------+--------------+ POP      Full           Yes      Yes                                 +---------+---------------+---------+-----------+----------+--------------+ PTV      Full                                                        +---------+---------------+---------+-----------+----------+--------------+ PERO     Full                                                        +---------+---------------+---------+-----------+----------+--------------+   +---------+---------------+---------+-----------+----------+--------------+ LEFT     CompressibilityPhasicitySpontaneityPropertiesThrombus Aging +---------+---------------+---------+-----------+----------+--------------+ CFV      Full           Yes      Yes                                 +---------+---------------+---------+-----------+----------+--------------+ SFJ      Full                                                        +---------+---------------+---------+-----------+----------+--------------+ FV Prox  Full                                                        +---------+---------------+---------+-----------+----------+--------------+ FV Mid   Full                                                         +---------+---------------+---------+-----------+----------+--------------+ FV DistalFull                                                        +---------+---------------+---------+-----------+----------+--------------+  PFV      Full                                                        +---------+---------------+---------+-----------+----------+--------------+ POP      Full           Yes      Yes                                 +---------+---------------+---------+-----------+----------+--------------+ PTV      Full                                                        +---------+---------------+---------+-----------+----------+--------------+ PERO     Full                                                        +---------+---------------+---------+-----------+----------+--------------+     Summary: RIGHT: - There is no evidence of deep vein thrombosis in the lower extremity.  - No cystic structure found in the popliteal fossa.  LEFT: - There is no evidence of deep vein thrombosis in the lower extremity.  - No cystic structure found in the popliteal fossa.  Bilateral PTV and peroneal veins are dilated with rouleaux flow; cannot exclude future formation of thrombus.  *See table(s) above for measurements and observations. Electronically signed by Servando Snare MD on 03/13/2021 at 12:25:07 PM.    Final      Discharge Exam: Vitals:   03/18/21 0740 03/18/21 1004  BP:    Pulse:    Resp:    Temp:    SpO2: 91% 93%   Vitals:   03/17/21 2037 03/18/21 0625 03/18/21 0740 03/18/21 1004  BP: 129/86 120/82    Pulse: 72 68    Resp: (!) 22     Temp: 98 F (36.7 C) 97.7 F (36.5 C)    TempSrc: Oral Oral    SpO2: 93% 91% 91% 93%  Weight:      Height:        General: Pt is alert, awake, not in acute distress, obese Cardiovascular: RRR, S1/S2 +, no rubs, no gallops Respiratory: CTA bilaterally, no wheezing, no rhonchi Abdominal: Soft, NT, ND, bowel  sounds + Extremities: no edema, no cyanosis    The results of significant diagnostics from this hospitalization (including imaging, microbiology, ancillary and laboratory) are listed below for reference.     Microbiology: Recent Results (from the past 240 hour(s))  Urine culture     Status: None   Collection Time: 03/12/21  1:22 PM   Specimen: Urine, Clean Catch  Result Value Ref Range Status   Specimen Description   Final    URINE, CLEAN CATCH Performed at [facility], [place] [address], [place], [place] [address]    Special Requests NONE  Final   Culture   Final    NO GROWTH Performed at [facility], [place] [address],  [place], [place] [address]    Report Status 03/14/2021 FINAL  Final  Resp Panel by RT-PCR (Flu A&B, Covid) Nasopharyngeal Swab     Status: None   Collection Time: 03/12/21  2:11 PM   Specimen: Nasopharyngeal Swab; Nasopharyngeal(NP) swabs in vial transport medium  Result Value Ref Range Status   SARS Coronavirus 2 by RT PCR NEGATIVE NEGATIVE Final    Comment: (NOTE) SARS-CoV-2 target nucleic acids are NOT DETECTED.  The SARS-CoV-2 RNA is generally detectable in upper respiratory specimens during the acute phase of infection. The lowest concentration of SARS-CoV-2 viral copies this assay can detect is 138 copies/mL. A negative result does not preclude SARS-Cov-2 infection and should not be used as the sole basis for treatment or other patient management decisions. A negative result may occur with  improper specimen collection/handling, submission of specimen other than nasopharyngeal swab, presence of viral mutation(s) within the areas targeted by this assay, and inadequate number of viral copies(<138 copies/mL). A negative result must be combined with clinical observations, patient history, and epidemiological information. The expected result is Negative.  Fact Sheet for Patients:  EntrepreneurPulse.com.au  Fact  Sheet for Healthcare Providers:  IncredibleEmployment.be  This test is no t yet approved or cleared by the [place] FDA and  has been authorized for detection and/or diagnosis of SARS-CoV-2 by FDA under an Emergency Use Authorization (EUA). This EUA will remain  in effect (meaning this test can be used) for the duration of the COVID-19 declaration under Section 564(b)(1) of the Act, 21 U.S.C.section 360bbb-3(b)(1), unless the authorization is terminated  or revoked sooner.       Influenza A by PCR NEGATIVE NEGATIVE Final   Influenza B by PCR NEGATIVE NEGATIVE Final    Comment: (NOTE) The Xpert Xpress SARS-CoV-2/FLU/RSV plus assay is intended as an aid in the diagnosis of influenza from Nasopharyngeal swab specimens and should not be used as a sole basis for treatment. Nasal washings and aspirates are unacceptable for Xpert Xpress SARS-CoV-2/FLU/RSV testing.  Fact Sheet for Patients: EntrepreneurPulse.com.au  Fact Sheet for Healthcare Providers: IncredibleEmployment.be  This test is not yet approved or cleared by the [place] FDA and has been authorized for detection and/or diagnosis of SARS-CoV-2 by FDA under an Emergency Use Authorization (EUA). This EUA will remain in effect (meaning this test can be used) for the duration of the COVID-19 declaration under Section 564(b)(1) of the Act, 21 U.S.C. section 360bbb-3(b)(1), unless the authorization is terminated or revoked.  Performed at [facility], [place] [address], [place], [place] [address]   Blood Culture (routine x 2)     Status: None   Collection Time: 03/12/21  2:12 PM   Specimen: BLOOD  Result Value Ref Range Status   Specimen Description   Final    BLOOD RIGHT ARM Performed at [facility] [address] [place], [place] [address]    Special Requests   Final    BOTTLES DRAWN AEROBIC AND ANAEROBIC Blood Culture  adequate volume Performed at [facility] [address], [place],  [address]    Culture   Final    NO GROWTH 5 DAYS Performed at [facility], [place] [address] [place],  [address]    Report Status 03/17/2021 FINAL  Final  Blood Culture (routine x 2)     Status: None   Collection Time: 03/12/21  2:22 PM   Specimen: BLOOD  Result Value Ref Range Status   Specimen Description  Final    BLOOD RIGHT ANTECUBITAL Performed at [facility] [address], [place], [place] [address]    Special Requests   Final    BOTTLES DRAWN AEROBIC AND ANAEROBIC Blood Culture adequate volume Performed at [facility] [address], [place], [place] [address]    Culture   Final    NO GROWTH 5 DAYS Performed at [facility], [place] [address], [place], [place] [address]    Report Status 03/17/2021 FINAL  Final     Labs: BNP (last 3 results) Recent Labs    03/12/21 1453 03/16/21 0352 03/17/21 0911  BNP 132.9* 232.7* 798.9*   Basic Metabolic Panel: Recent Labs  Lab 03/14/21 0318 03/15/21 0314 03/16/21 0352 03/17/21 0353 03/18/21 0330  NA 140 144 144 139 136  K 4.3 4.5 4.6 3.9 4.1  CL 101 106 105 88* 91*  CO2 33* 33* 34* 41* 36*  GLUCOSE 355* 230* 224* 172* 164*  BUN 54* 37* 31* 29* 37*  CREATININE 1.29* 0.99 0.97 0.92 0.99  CALCIUM 9.2 9.2 9.7 9.9 10.0  MG 2.4  --   --   --   --    Liver Function Tests: Recent Labs  Lab 03/12/21 1412 03/13/21 0329  AST 14* 13*  ALT 15 13  ALKPHOS 58 50  BILITOT 2.4* 1.9*  PROT 8.2* 7.1  ALBUMIN 4.3 3.5   No results for input(s): LIPASE, AMYLASE in the last 168 hours. No results for input(s): AMMONIA in the last 168 hours. CBC: Recent Labs  Lab 03/14/21 0318 03/15/21 0314 03/16/21 0352 03/17/21 0353 03/18/21 0330  WBC 8.4 12.5* 12.6* 9.1 10.3  NEUTROABS 7.9* 11.6* 11.3* 8.0* 9.1*  HGB 16.8 16.3 17.4* 18.8* 19.9*  HCT 55.1* 55.0* 58.0* 58.7* 61.1*   MCV 104.2* 106.8* 105.3* 99.0 96.5  PLT 139* 142* 151 145* 181   Cardiac Enzymes: No results for input(s): CKTOTAL, CKMB, CKMBINDEX, TROPONINI in the last 168 hours. BNP: Invalid input(s): POCBNP CBG: Recent Labs  Lab 03/17/21 1130 03/17/21 1613 03/17/21 2045 03/18/21 0715 03/18/21 1134  GLUCAP 272* 193* 122* 218* 367*   D-Dimer No results for input(s): DDIMER in the last 72 hours. Hgb A1c No results for input(s): HGBA1C in the last 72 hours. Lipid Profile No results for input(s): CHOL, HDL, LDLCALC, TRIG, CHOLHDL, LDLDIRECT in the last 72 hours. Thyroid function studies No results for input(s): TSH, T4TOTAL, T3FREE, THYROIDAB in the last 72 hours.  Invalid input(s): FREET3 Anemia work up No results for input(s): VITAMINB12, FOLATE, FERRITIN, TIBC, IRON, RETICCTPCT in the last 72 hours. Urinalysis    Component Value Date/Time   COLORURINE AMBER (A) 03/12/2021 1322   APPEARANCEUR HAZY (A) 03/12/2021 1322   LABSPEC 1.024 03/12/2021 1322   PHURINE 5.0 03/12/2021 1322   GLUCOSEU NEGATIVE 03/12/2021 1322   HGBUR NEGATIVE 03/12/2021 1322   BILIRUBINUR NEGATIVE 03/12/2021 1322   KETONESUR NEGATIVE 03/12/2021 1322   PROTEINUR 100 (A) 03/12/2021 1322   NITRITE NEGATIVE 03/12/2021 1322   LEUKOCYTESUR NEGATIVE 03/12/2021 1322   Sepsis Labs Invalid input(s): PROCALCITONIN,  WBC,  LACTICIDVEN Microbiology Recent Results (from the past 240 hour(s))  Urine culture     Status: None   Collection Time: 03/12/21  1:22 PM   Specimen: Urine, Clean Catch  Result Value Ref Range Status   Specimen Description   Final    URINE, CLEAN CATCH Performed at [facility], [place] [address] [place], [place] [address]    Special Requests  NONE  Final   Culture   Final    NO GROWTH Performed at [facility], [place] [address], [place], [place] [address]    Report Status 03/14/2021 FINAL  Final  Resp Panel by RT-PCR (Flu A&B, Covid) Nasopharyngeal Swab     Status: None    Collection Time: 03/12/21  2:11 PM   Specimen: Nasopharyngeal Swab; Nasopharyngeal(NP) swabs in vial transport medium  Result Value Ref Range Status   SARS Coronavirus 2 by RT PCR NEGATIVE NEGATIVE Final    Comment: (NOTE) SARS-CoV-2 target nucleic acids are NOT DETECTED.  The SARS-CoV-2 RNA is generally detectable in upper respiratory specimens during the acute phase of infection. The lowest concentration of SARS-CoV-2 viral copies this assay can detect is 138 copies/mL. A negative result does not preclude SARS-Cov-2 infection and should not be used as the sole basis for treatment or other patient management decisions. A negative result may occur with  improper specimen collection/handling, submission of specimen other than nasopharyngeal swab, presence of viral mutation(s) within the areas targeted by this assay, and inadequate number of viral copies(<138 copies/mL). A negative result must be combined with clinical observations, patient history, and epidemiological information. The expected result is Negative.  Fact Sheet for Patients:  EntrepreneurPulse.com.au  Fact Sheet for Healthcare Providers:  IncredibleEmployment.be  This test is no t yet approved or cleared by the [place] FDA and  has been authorized for detection and/or diagnosis of SARS-CoV-2 by FDA under an Emergency Use Authorization (EUA). This EUA will remain  in effect (meaning this test can be used) for the duration of the COVID-19 declaration under Section 564(b)(1) of the Act, 21 U.S.C.section 360bbb-3(b)(1), unless the authorization is terminated  or revoked sooner.       Influenza A by PCR NEGATIVE NEGATIVE Final   Influenza B by PCR NEGATIVE NEGATIVE Final    Comment: (NOTE) The Xpert Xpress SARS-CoV-2/FLU/RSV plus assay is intended as an aid in the diagnosis of influenza from Nasopharyngeal swab specimens and should not be used as a sole basis for treatment.  Nasal washings and aspirates are unacceptable for Xpert Xpress SARS-CoV-2/FLU/RSV testing.  Fact Sheet for Patients: EntrepreneurPulse.com.au  Fact Sheet for Healthcare Providers: IncredibleEmployment.be  This test is not yet approved or cleared by the [place] FDA and has been authorized for detection and/or diagnosis of SARS-CoV-2 by FDA under an Emergency Use Authorization (EUA). This EUA will remain in effect (meaning this test can be used) for the duration of the COVID-19 declaration under Section 564(b)(1) of the Act, 21 U.S.C. section 360bbb-3(b)(1), unless the authorization is terminated or revoked.  Performed at [facility], [place] [address], [place], [place] [address]   Blood Culture (routine x 2)     Status: None   Collection Time: 03/12/21  2:12 PM   Specimen: BLOOD  Result Value Ref Range Status   Specimen Description   Final    BLOOD RIGHT ARM Performed at [place] [address], [place], [place] [address]    Special Requests   Final    BOTTLES DRAWN AEROBIC AND ANAEROBIC Blood Culture adequate volume Performed at [place] [address] [place], [place] [address]    Culture   Final    NO GROWTH 5 DAYS Performed at [facility], [place] [address], [place], [place] [address]    Report Status 03/17/2021 FINAL  Final  Blood Culture (routine x 2)     Status: None  Collection Time: 03/12/21  2:22 PM   Specimen: BLOOD  Result Value Ref Range Status   Specimen Description   Final    BLOOD RIGHT ANTECUBITAL Performed at [place] [address], [place], [place] [address]    Special Requests   Final    BOTTLES DRAWN AEROBIC AND ANAEROBIC Blood Culture adequate volume Performed at [place] [address] [place], [place] [address]    Culture   Final    NO GROWTH 5 DAYS Performed at [place], [place] [address], [place],  [address]    Report Status 03/17/2021 FINAL  Final     Time coordinating discharge: Over 30 minutes  SIGNED:   Darliss Cheney, MD  [facility] 03/18/2021, 1:13 PM  If 7PM-7AM, please contact night-coverage [email or web address]

## 2021-03-18 NOTE — TOC Progression Note (Signed)
Transition of Care [facility]) - Progression Note    Patient Details  Name: Maxwell Grant MRN: [id number] Date of Birth: [birth year]  [facility] CM/SW Contact  Joaquin Courts, RN Phone Number: 03/18/2021, 10:45 AM  Clinical Narrative:    CM spoke with patient regarding discharge planning.  Patient plans to discharge home today.  Home oxygen set up through [employer], portable tank to be delivered to bedside.  [employer] rep Sarita Haver confirms respiratory therapist is set up to meet patient at home to set up Trilogy and home stationary oxygen unit.  Patient confirms he has pcp through [place], Dr Coletta Memos. No further TOC needs identified.   Expected Discharge Plan: Home/Self Care Barriers to Discharge: No Barriers Identified  Expected Discharge Plan and Services Expected Discharge Plan: Home/Self Care   Discharge Planning Services: CM Consult   Living arrangements for the past 2 months: Single Family Home Expected Discharge Date: 03/18/21               DME Arranged: Oxygen, Ventilator DME Agency: Other - Comment [facility]) Date DME Agency Contacted: 03/18/21 Time DME Agency Contacted: [id number] Representative spoke with at DME Agency: [place] Determinants of Health ([place]) Interventions    Readmission Risk Interventions No flowsheet data found.

## 2021-03-18 NOTE — Progress Notes (Signed)
SATURATION QUALIFICATIONS: (This note is used to comply with regulatory documentation for home oxygen)  Patient Saturations on Room Air at Rest = 91%  Patient Saturations on Room Air while Ambulating = 85%  Patient Saturations on 2 Liters of oxygen while Ambulating = 90%  Please briefly explain why patient needs home oxygen: desaturation with ambulation

## 2021-03-22 ENCOUNTER — Other Ambulatory Visit (HOSPITAL_COMMUNITY): Payer: Self-pay

## 2021-03-23 LAB — LEGIONELLA PNEUMOPHILA SEROGP 1 UR AG: L. pneumophila Serogp 1 Ur Ag: NEGATIVE

## 2021-04-22 ENCOUNTER — Telehealth: Payer: Self-pay

## 2021-04-22 NOTE — Telephone Encounter (Signed)
ATC patient in regards to upcoming appt, would like to know if patient has ever had a sleep study done before if so where? If not, nothing further is needed.

## 2021-04-25 ENCOUNTER — Other Ambulatory Visit: Payer: Self-pay

## 2021-04-25 ENCOUNTER — Ambulatory Visit (INDEPENDENT_AMBULATORY_CARE_PROVIDER_SITE_OTHER): Payer: Medicaid Other | Admitting: Pulmonary Disease

## 2021-04-25 ENCOUNTER — Encounter: Payer: Self-pay | Admitting: Pulmonary Disease

## 2021-04-25 ENCOUNTER — Ambulatory Visit (INDEPENDENT_AMBULATORY_CARE_PROVIDER_SITE_OTHER): Payer: Medicaid Other

## 2021-04-25 VITALS — BP 128/70 | HR 92 | Temp 98.3°F | Ht 67.0 in | Wt 198.8 lb

## 2021-04-25 DIAGNOSIS — J449 Chronic obstructive pulmonary disease, unspecified: Secondary | ICD-10-CM | POA: Diagnosis not present

## 2021-04-25 DIAGNOSIS — Z8701 Personal history of pneumonia (recurrent): Secondary | ICD-10-CM

## 2021-04-25 NOTE — Patient Instructions (Signed)
Chest xray today  Will arrange for pulmonary function test  Call back with name of medical supply company that set up your Trilogy machine and home oxygen  Follow up in 2 months

## 2021-04-25 NOTE — Progress Notes (Signed)
[facility], Critical Care, and Sleep Medicine  Chief Complaint  Patient presents with   Consult    COPD and OSA    Constitutional:  BP 128/70 (BP Location: Left Arm)   Pulse 92   Temp 98.3 F (36.8 C) (Oral)   Ht _0  (1.702 m)   Wt 198 lb 12.8 oz (90.2 kg)   SpO2 92%   BMI 31.14 kg/m   Past Medical History:  Hypertension  Past Surgical History:  He  has no past surgical history on file.  Brief Summary:  Maxwell Grant is a 55 y.o. male smoker with for assessment of COPD.      Subjective:   He is here with his wife.  He was in hospital in June for pneumonia and there was concern he could have COPD and sleep disordered breathing.  He was sent home with Trilogy and oxygen which he uses at night.  Uses Trilogy more than oxygen.  Back to smoking 4 to 5 cigarettes per day.    Has occasional cough with clear sputum.  Not having wheeze.  Has been using anoro.  Uses albuterol intermittently.   Not having fever, chest pain, hemoptysis, wheeze, or leg swelling.  Physical Exam:   Appearance - well kempt   ENMT - no sinus tenderness, no oral exudate, no LAN, Mallampati 3 airway, no stridor  Respiratory - equal breath sounds bilaterally, no wheezing or rales  CV - s1s2 regular rate and rhythm, no murmurs  Ext - no clubbing, no edema  Skin - no rashes  Psych - normal mood and affect   Pulmonary testing:  ABG on 3.5 liters 03/17/21 >> pH 7.48, PCO2 55.4, PO2 56.9  Chest Imaging:  V/Q scan 03/13/21 >> very low probability for PE  Sleep Tests:    Cardiac Tests:  Echo 03/13/21 >> EF 60 to 65%, mild LVH  Social History:  He  reports that he has been smoking cigarettes. He has been smoking an average of 1 pack per day. He has never used smokeless tobacco. He reports current alcohol use of about 2.0 standard drinks of alcohol per week. He reports that he does not use drugs.  Family History:  His family history is not on file.     Assessment/Plan:    History of tobacco abuse with presumed COPD. - continue anoro - prn albuterol - will arrange for pulmonary function test  Tobacco abuse. - he will continue to gradual quit smoking on his own  Chronic respiratory failure with hypoxia and hypercapnia. - continue Trilogy home vent at night and supplemental oxygen at night for now - he will call back with name of DME and then will get copy of his download - defer sleep testing unless required by insurance  History of pneumonia. - repeat chest xray today  Time Spent Involved in Patient Care on Day of Examination:  33 minutes  Follow up:   Patient Instructions  Chest xray today  Will arrange for pulmonary function test  Call back with name of medical supply company that set up your Trilogy machine and home oxygen  Follow up in 2 months  Medication List:   Allergies as of 04/25/2021       Reactions   Aspirin Diarrhea, Nausea And Vomiting   Penicillins Diarrhea, Nausea And Vomiting        Medication List        Accurate as of April 25, 2021 12:05 PM. If you have any questions, ask  your nurse or doctor.          STOP taking these medications    atorvastatin 20 MG tablet Commonly known as: LIPITOR Stopped by: Chesley Mires, MD   Tiotropium Bromide-Olodaterol 2.5-2.5 MCG/ACT Aers Stopped by: Chesley Mires, MD       TAKE these medications    Accu-Chek FastClix Lancets Misc Use to test blood sugar 4 times a day   Accu-Chek Guide test strip Generic drug: glucose blood Use to test blood sugar 4 times a day   Accu-Chek Guide test strip Generic drug: glucose blood Use to test blood sugar 4 times a day   Accu-Chek Guide w/Device Kit Use to test blood sugar 4 times a day   albuterol 108 (90 Base) MCG/ACT inhaler Commonly known as: VENTOLIN HFA Inhale 1-2 puffs into the lungs every 4 (four) hours as needed for wheezing or shortness of breath.   amLODipine 10 MG tablet Commonly known as: NORVASC Take 10  mg by mouth daily.   Anoro Ellipta 62.5-25 MCG/INH Aepb Generic drug: umeclidinium-vilanterol Inhale 1 puff into the lungs daily.   chlorthalidone 25 MG tablet Commonly known as: HYGROTON Take 25 mg by mouth daily.   glimepiride 2 MG tablet Commonly known as: AMARYL Take 2 mg by mouth daily.   lisinopril 30 MG tablet Commonly known as: ZESTRIL Take 30 mg by mouth daily.   simvastatin 10 MG tablet Commonly known as: ZOCOR Take by mouth.        Signature:  Chesley Mires, MD [place] Pager - [phone] 04/25/2021, 12:05 PM

## 2021-04-26 ENCOUNTER — Telehealth: Payer: Self-pay

## 2021-04-26 NOTE — Telephone Encounter (Signed)
-----   Message from Chesley Mires, MD sent at 04/26/2021 11:06 AM EDT ----- Please let him know his chest xray shows scarring likely from pneumonia.  Will need to review his PFT before determining how significant this is in relation to his respiratory status.

## 2021-04-26 NOTE — Telephone Encounter (Signed)
Called to speak with patient to get him scheduled for PFT and 2 month follow up with Dr. Halford Chessman as well as go over CXR results. Patients daughter stated that he was currently outside mowing. Asked her to have him call back when he can so we can get him scheduled and discuss results.    When patient calls back please schedule him for a PFT and OV with Dr. Halford Chessman the week of October 10th

## 2021-04-27 NOTE — Telephone Encounter (Signed)
Looks like patient has been scheduled for PFT and OV with Dr. Halford Chessman on 06/27/21. Nothing further needed at this time.

## 2021-05-16 ENCOUNTER — Institutional Professional Consult (permissible substitution): Payer: Medicaid Other | Admitting: Pulmonary Disease

## 2021-06-27 ENCOUNTER — Ambulatory Visit: Payer: Medicaid Other | Admitting: Pulmonary Disease

## 2021-08-15 ENCOUNTER — Ambulatory Visit: Payer: Medicaid Other | Admitting: Pulmonary Disease

## 2021-10-18 ENCOUNTER — Ambulatory Visit (INDEPENDENT_AMBULATORY_CARE_PROVIDER_SITE_OTHER): Payer: Medicaid Other | Admitting: Pulmonary Disease

## 2021-10-18 ENCOUNTER — Encounter: Payer: Self-pay | Admitting: Pulmonary Disease

## 2021-10-18 ENCOUNTER — Other Ambulatory Visit: Payer: Self-pay

## 2021-10-18 VITALS — BP 138/80 | HR 68 | Temp 97.9°F | Ht 67.0 in | Wt 197.0 lb

## 2021-10-18 DIAGNOSIS — J9612 Chronic respiratory failure with hypercapnia: Secondary | ICD-10-CM

## 2021-10-18 DIAGNOSIS — J9611 Chronic respiratory failure with hypoxia: Secondary | ICD-10-CM | POA: Diagnosis not present

## 2021-10-18 DIAGNOSIS — J449 Chronic obstructive pulmonary disease, unspecified: Secondary | ICD-10-CM | POA: Diagnosis not present

## 2021-10-18 LAB — PULMONARY FUNCTION TEST
DL/VA % pred: 59 %
DL/VA: 2.6 ml/min/mmHg/L
DLCO cor % pred: 61 %
DLCO cor: 15.75 ml/min/mmHg
DLCO unc % pred: 61 %
DLCO unc: 15.75 ml/min/mmHg
FEF 25-75 Post: 0.72 L/sec
FEF 25-75 Pre: 0.72 L/sec
FEF2575-%Change-Post: 0 %
FEF2575-%Pred-Post: 25 %
FEF2575-%Pred-Pre: 25 %
FEV1-%Change-Post: 1 %
FEV1-%Pred-Post: 49 %
FEV1-%Pred-Pre: 48 %
FEV1-Post: 1.65 L
FEV1-Pre: 1.63 L
FEV1FVC-%Change-Post: 0 %
FEV1FVC-%Pred-Pre: 62 %
FEV6-%Change-Post: 0 %
FEV6-%Pred-Post: 81 %
FEV6-%Pred-Pre: 81 %
FEV6-Post: 3.4 L
FEV6-Pre: 3.41 L
FEV6FVC-%Change-Post: -1 %
FEV6FVC-%Pred-Post: 103 %
FEV6FVC-%Pred-Pre: 104 %
FVC-%Change-Post: 0 %
FVC-%Pred-Post: 78 %
FVC-%Pred-Pre: 78 %
FVC-Post: 3.44 L
FVC-Pre: 3.41 L
Post FEV1/FVC ratio: 48 %
Post FEV6/FVC ratio: 99 %
Pre FEV1/FVC ratio: 48 %
Pre FEV6/FVC Ratio: 100 %

## 2021-10-18 NOTE — Patient Instructions (Signed)
Full PFT performed today. °

## 2021-10-18 NOTE — Progress Notes (Signed)
Full PFT performed today. °

## 2021-10-18 NOTE — Patient Instructions (Signed)
Will call you with the results of your sleep machine download  Follow up in 6 months

## 2021-10-18 NOTE — Progress Notes (Signed)
[facility], [facility]  Chief Complaint  Patient presents with   Follow-up    PFT review     Constitutional:  BP 138/80 (BP Location: Left Arm, Cuff Size: Normal)    Pulse 68    Temp 97.9 F (36.6 C)    Ht _0  (1.702 m)    Wt 197 lb (89.4 kg)    SpO2 95%    BMI 30.85 kg/m   Past Medical History:  Hypertension  Past Surgical History:  He  has no past surgical history on file.  Brief Summary:  Maxwell Grant is a 56 y.o. male smoker with for assessment of COPD.      Subjective:   He is here with his wife.  PFT today showed moderate obstruction, air trapping and mild diffusion defect.  Using Trilogy nightly.  Helps with his breathing.  Down to 1/2 ppd.  Plans to gradually quit.  Not having much cough, wheeze, or sputum.  Anoro helps.  CXR from 04/25/21 showed changes of COPD.  Physical Exam:   Appearance - well kempt   ENMT - no sinus tenderness, no oral exudate, no LAN, Mallampati 3 airway, no stridor  Respiratory - equal breath sounds bilaterally, no wheezing or rales  CV - s1s2 regular rate and rhythm, no murmurs  Ext - no clubbing, no edema  Skin - no rashes  Psych - normal mood and affect    Pulmonary testing:  ABG on 3.5 liters 03/17/21 >> pH 7.48, PCO2 55.4, PO2 56.9 PFT 10/18/21 >> FEV1 1.65 (49%), FEV1% 48, TLC 6.57 (104%), RV 2.79 (138%), DLCO 61%  Chest Imaging:  V/Q scan 03/13/21 >> very low probability for PE  Sleep Tests:    Cardiac Tests:  Echo 03/13/21 >> EF 60 to 65%, mild LVH  Social History:  He  reports that he has been smoking cigarettes. He has been smoking an average of 1 pack per day. He has never used smokeless tobacco. He reports current alcohol use of about 2.0 standard drinks per week. He reports that he does not use drugs.  Family History:  His family history is not on file.     Assessment/Plan:   COPD with emphysema. - continue anoro - prn albuterol  Tobacco abuse. - he will  continue to gradually cut down  Chronic respiratory failure with hypoxia and hypercapnia. - using Trilogy home vent and oxygen at night - will call him with results of his download   Time Spent Involved in Patient Care on Day of Examination:  37 minutes  Follow up:   Patient Instructions  Will call you with the results of your sleep machine download  Follow up in 6 months  Medication List:   Allergies as of 10/18/2021       Reactions   Aspirin Diarrhea, Nausea And Vomiting   Penicillins Diarrhea, Nausea And Vomiting        Medication List        Accurate as of October 18, 2021 10:27 AM. If you have any questions, ask your nurse or doctor.          Accu-Chek FastClix Lancets Misc Use to test blood sugar 4 times a day   Accu-Chek Guide test strip Generic drug: glucose blood Use to test blood sugar 4 times a day   Accu-Chek Guide test strip Generic drug: glucose blood Use to test blood sugar 4 times a day   Accu-Chek Guide w/Device Kit Use to test blood  sugar 4 times a day   albuterol 108 (90 Base) MCG/ACT inhaler Commonly known as: VENTOLIN HFA Inhale 1-2 puffs into the lungs every 4 (four) hours as needed for wheezing or shortness of breath.   amLODipine 10 MG tablet Commonly known as: NORVASC Take 10 mg by mouth daily.   Anoro Ellipta 62.5-25 MCG/ACT Aepb Generic drug: umeclidinium-vilanterol Inhale 1 puff into the lungs daily.   chlorthalidone 25 MG tablet Commonly known as: HYGROTON Take 25 mg by mouth daily.   glimepiride 2 MG tablet Commonly known as: AMARYL Take 2 mg by mouth daily.   lisinopril 30 MG tablet Commonly known as: ZESTRIL Take 30 mg by mouth daily.   simvastatin 10 MG tablet Commonly known as: ZOCOR Take by mouth.        Signature:  Chesley Mires, MD [place] Pager - [phone] 10/18/2021, 10:27 AM

## 2021-11-02 ENCOUNTER — Telehealth: Payer: Self-pay | Admitting: Pulmonary Disease

## 2021-11-02 NOTE — Telephone Encounter (Signed)
ATC no VM available

## 2021-11-02 NOTE — Telephone Encounter (Signed)
Trilogy 03/18/21 to 10/09/21 >> used on 176 of 195 days with average 4 hrs 32 min.  Average Ti 1.4 sec, RR 21.    Settings: TTV-VAPS-AE, min EPAP 5/max EPAP 15 cm H2O, Pinsp 5 cm H2O, Pinsp max 20 cm H2O, Ti min 0.7 sec/Ti max 2 sec with auto Ti, target Vt 500 ml.   Please let him know his Trilogy report shows good compliance.  He should continue using this while asleep.

## 2021-11-03 NOTE — Telephone Encounter (Signed)
Called and spoke to patient about results. He voiced understanding. Nothing further needed.

## 2022-03-23 ENCOUNTER — Telehealth: Payer: Self-pay | Admitting: Pulmonary Disease

## 2022-03-23 DIAGNOSIS — J9611 Chronic respiratory failure with hypoxia: Secondary | ICD-10-CM

## 2022-03-23 NOTE — Telephone Encounter (Signed)
Will need to call [employer] 7/7 to gather more info. Pt does have a f/u appt scheduled with [facility] in August.

## 2022-03-23 NOTE — Telephone Encounter (Signed)
Patient is calling to request  prescription for his cpap machine because he stated that [employer] is coming to pick it up because it has not been re-prescribed for another 6 months.  Please call patient to discuss.  Fax # for [employer] is [phone]

## 2022-03-24 NOTE — Telephone Encounter (Signed)
Need to speak to pt first  He said CPAP but we have he should be using Trilogy not CPAP  Called pt and there was no answer- [facility]

## 2022-03-28 NOTE — Telephone Encounter (Signed)
Called and spoke with patient briefly, he has given [place] permission to speak with Leda Gauze on his behalf. Leda Gauze stated that they received a letter from [facility] stating that they were coming to pick up his machine due to no orders being placed by our office for him to continue the Trilogy machine. I advised her that Dr. Halford Chessman had reviewed a compliance report earlier this year and stated that he needed to remain on the machine. She confirmed that he is still using the machine each night.  I advised her that I would call [employer] to see what is really needed. She verbalized understanding.   Called [employer] and spoke with [place]. She confirmed that they had sent a letter to the patient stating that they needed a new order from Dr. Halford Chessman stating that the patient will need the Trilogy for lifetime use so that his insurance will continue to pay.   Dr. Halford Chessman, please advise if you are ok with [place] placing this order to [facility]. Thanks!

## 2022-03-28 NOTE — Telephone Encounter (Signed)
Called and spoke with Leda Gauze. She verbalized understanding. Order has been placed. Nothing further needed at time of call.

## 2022-03-28 NOTE — Telephone Encounter (Signed)
Okay to send order. 

## 2022-03-28 NOTE — Telephone Encounter (Signed)
Maxwell Grant friend is checking on message for [employer] for recertification CPAP machine. Maxwell Grant phone number is [phone].

## 2022-04-10 ENCOUNTER — Telehealth: Payer: Self-pay | Admitting: Pulmonary Disease

## 2022-04-10 NOTE — Telephone Encounter (Signed)
Refaxing order for oxygen for lifetime use. Order was sent 03/30/22. But refaxing to [employer].Nothing further needed

## 2022-04-14 ENCOUNTER — Telehealth: Payer: Self-pay | Admitting: Pulmonary Disease

## 2022-04-14 MED ORDER — ANORO ELLIPTA 62.5-25 MCG/ACT IN AEPB: 1.0000 | INHALATION_SPRAY | Freq: Every day | RESPIRATORY_TRACT | 3 refills | Status: DC

## 2022-04-14 NOTE — Telephone Encounter (Signed)
Called and informed patient that I was refilling Anoro for him. And verified pharmacy. Refills sent. Nothing further needed

## 2022-05-09 ENCOUNTER — Ambulatory Visit: Payer: Medicaid Other | Admitting: Pulmonary Disease

## 2022-06-13 ENCOUNTER — Encounter: Payer: Self-pay | Admitting: Pulmonary Disease

## 2022-06-13 ENCOUNTER — Ambulatory Visit (INDEPENDENT_AMBULATORY_CARE_PROVIDER_SITE_OTHER): Payer: Medicaid Other | Admitting: Pulmonary Disease

## 2022-06-13 VITALS — BP 130/80 | HR 86 | Ht 66.0 in | Wt 195.8 lb

## 2022-06-13 DIAGNOSIS — J9612 Chronic respiratory failure with hypercapnia: Secondary | ICD-10-CM | POA: Diagnosis not present

## 2022-06-13 DIAGNOSIS — J9611 Chronic respiratory failure with hypoxia: Secondary | ICD-10-CM

## 2022-06-13 DIAGNOSIS — J449 Chronic obstructive pulmonary disease, unspecified: Secondary | ICD-10-CM

## 2022-06-13 NOTE — Progress Notes (Signed)
[facility]  Chief Complaint  Patient presents with   Follow-up    Constitutional:  BP 130/80 (BP Location: Left Arm)   Pulse 86   Ht 5' 6"  (1.676 m)   Wt 195 lb 12.8 oz (88.8 kg)   SpO2 93%   BMI 31.60 kg/m   Past Medical History:  Hypertension  Past Surgical History:  He  has no past surgical history on file.  Brief Summary:  Maxwell Grant is a 56 y.o. male smoker with for assessment of COPD.      Subjective:   He is here with his wife.  He is still smoking 1/2 to 1 ppd.  He doesn't feel like he is ready to quit yet.  Has occasional cough with clear sputum.  Not having wheeze, or chest pain.  Feels like he keeps up with his activities during the day.  Using Trilogy at night w/o difficulty.    Physical Exam:   Appearance - well kempt   ENMT - no sinus tenderness, no oral exudate, no LAN, Mallampati 3 airway, no stridor  Respiratory - equal breath sounds bilaterally, no wheezing or rales  CV - s1s2 regular rate and rhythm, no murmurs  Ext - no clubbing, no edema  Skin - no rashes  Psych - normal mood and affect     Pulmonary testing:  ABG on 3.5 liters 03/17/21 >> pH 7.48, PCO2 55.4, PO2 56.9 PFT 10/18/21 >> FEV1 1.65 (49%), FEV1% 48, TLC 6.57 (104%), RV 2.79 (138%), DLCO 61%  Chest Imaging:  V/Q scan 03/13/21 >> very low probability for PE  Sleep Tests:  Trilogy 03/18/21 to 10/09/21 >> used on 176 of 195 days with average 4 hrs 32 min.  Average Ti 1.4 sec, RR 21.    Cardiac Tests:  Echo 03/13/21 >> EF 60 to 65%, mild LVH  Social History:  He  reports that he has been smoking cigarettes. He has been smoking an average of 1 pack per day. He has never used smokeless tobacco. He reports current alcohol use of about 2.0 standard drinks of alcohol per week. He reports that he does not use drugs.  Family History:  His family history is not on file.     Assessment/Plan:   COPD with emphysema. - continue  anoro - prn albuterol - he will get his flu and COVID vaccines from his pharmacy  Tobacco abuse. - discussed options to help with smoking cessation - discussed lung cancer screening >> he doesn't think he is ready to commit to something like this at this time  Chronic respiratory failure with hypoxia and hypercapnia. - using Trilogy home vent and oxygen at night - TTV-VAPS-AE, min EPAP 5/max EPAP 15 cm H2O, Pinsp 5 cm H2O, Pinsp max 20 cm H2O, Ti min 0.7 sec/Ti max 2 sec with auto Ti, target Vt 500 ml - uses [facility]  Time Spent Involved in Patient Care on Day of Examination:  26 minutes  Follow up:   Patient Instructions  Follow up in 6 months  Medication List:   Allergies as of 06/13/2022       Reactions   Aspirin Diarrhea, Nausea And Vomiting   Penicillins Diarrhea, Nausea And Vomiting        Medication List        Accurate as of June 13, 2022 11:35 AM. If you have any questions, ask your nurse or doctor.  Accu-Chek FastClix Lancets Misc Use to test blood sugar 4 times a day   Accu-Chek Guide test strip Generic drug: glucose blood Use to test blood sugar 4 times a day   Accu-Chek Guide test strip Generic drug: glucose blood Use to test blood sugar 4 times a day   Accu-Chek Guide w/Device Kit Use to test blood sugar 4 times a day   albuterol 108 (90 Base) MCG/ACT inhaler Commonly known as: VENTOLIN HFA Inhale 1-2 puffs into the lungs every 4 (four) hours as needed for wheezing or shortness of breath.   amLODipine 10 MG tablet Commonly known as: NORVASC Take 10 mg by mouth daily.   Anoro Ellipta 62.5-25 MCG/ACT Aepb Generic drug: umeclidinium-vilanterol Inhale 1 puff into the lungs daily. What changed: Another medication with the same name was removed. Continue taking this medication, and follow the directions you see here. Changed by: Chesley Mires, MD   chlorthalidone 25 MG tablet Commonly known as: HYGROTON Take 25  mg by mouth daily.   glimepiride 2 MG tablet Commonly known as: AMARYL Take 2 mg by mouth daily.   lisinopril 30 MG tablet Commonly known as: ZESTRIL Take 30 mg by mouth daily.   simvastatin 10 MG tablet Commonly known as: ZOCOR Take by mouth.        Signature:  Chesley Mires, MD [place] Pager - [phone] 06/13/2022, 11:35 AM

## 2022-06-13 NOTE — Patient Instructions (Signed)
Follow up in 6 months 

## 2022-08-08 ENCOUNTER — Telehealth: Payer: Self-pay | Admitting: *Deleted

## 2022-08-08 NOTE — Telephone Encounter (Signed)
Called and spoke with pt who states he has been coughing for the past 3-4 days. States when he coughs, the cough is mainly a dry cough but states that he has coughed up some yellow phlegm. Pt said if he gets to doing any activities, he will begin to wheeze.  Pt states that he does take mucinex and zyrtec daily. States that he has used his rescue inhaler at least twice a day. States that he is using his Anoro inhaler daily.  With the holiday coming, pt wants to have an abx sent to the pharmacy to help with his symptoms. Sarah, please advise.

## 2022-08-08 NOTE — Telephone Encounter (Signed)
Z-Pak take as directed #1 no refills. Mucinex DM twice daily as needed for cough congestion.  Continue on Anoro daily.  Albuterol inhaler as needed If not improving will need office visit for further evaluation  Please contact office for sooner follow up if symptoms do not improve or worsen or seek emergency care

## 2022-08-09 MED ORDER — AZITHROMYCIN 250 MG PO TABS: ORAL_TABLET | ORAL | 0 refills | Status: DC

## 2022-08-09 NOTE — Telephone Encounter (Signed)
Called and spoke with pt letting him know recs per TP and he verbalized understanding. Zpak has been sent to preferred pharmacy. Nothing further needed.

## 2022-09-21 ENCOUNTER — Telehealth: Payer: Self-pay | Admitting: Pulmonary Disease

## 2022-09-21 NOTE — Telephone Encounter (Signed)
His Theodis Sato is his care taker. Her # is [phone]. She is not on as a contact but the drug store is.   She can hand the phone to Chez for permission to speak to her.

## 2022-09-21 NOTE — Telephone Encounter (Signed)
Called patient and asked him to call office back to verify medication. Medication that was listed in triage chat is not even a med. Will await call back

## 2022-09-21 NOTE — Telephone Encounter (Signed)
Needs medication and every month she has to call in. Dr. Halford Chessman said last time he would get them a year RX. Please call to advise @ [phone]  Amoro is the med  [facility] on [address]. [place]

## 2022-09-22 MED ORDER — ANORO ELLIPTA 62.5-25 MCG/ACT IN AEPB: 1.0000 | INHALATION_SPRAY | Freq: Every day | RESPIRATORY_TRACT | 11 refills | Status: DC

## 2022-09-22 NOTE — Telephone Encounter (Signed)
Spoke with the pt to verify- he needs anoro sent to [address]  I have sent this in  Nothing further needed

## 2022-10-19 DIAGNOSIS — C3490 Malignant neoplasm of unspecified part of unspecified bronchus or lung: Secondary | ICD-10-CM

## 2022-10-19 HISTORY — DX: Malignant neoplasm of unspecified part of unspecified bronchus or lung: C34.90

## 2022-10-23 ENCOUNTER — Ambulatory Visit (INDEPENDENT_AMBULATORY_CARE_PROVIDER_SITE_OTHER): Payer: Medicaid Other | Admitting: Pulmonary Disease

## 2022-10-23 ENCOUNTER — Encounter (HOSPITAL_BASED_OUTPATIENT_CLINIC_OR_DEPARTMENT_OTHER): Payer: Self-pay | Admitting: Pulmonary Disease

## 2022-10-23 ENCOUNTER — Telehealth: Payer: Self-pay | Admitting: Pulmonary Disease

## 2022-10-23 VITALS — BP 130/70 | HR 88 | Temp 98.5°F | Ht 66.0 in | Wt 192.0 lb

## 2022-10-23 DIAGNOSIS — R918 Other nonspecific abnormal finding of lung field: Secondary | ICD-10-CM | POA: Diagnosis not present

## 2022-10-23 DIAGNOSIS — J9611 Chronic respiratory failure with hypoxia: Secondary | ICD-10-CM

## 2022-10-23 DIAGNOSIS — J449 Chronic obstructive pulmonary disease, unspecified: Secondary | ICD-10-CM

## 2022-10-23 DIAGNOSIS — J9612 Chronic respiratory failure with hypercapnia: Secondary | ICD-10-CM

## 2022-10-23 NOTE — Progress Notes (Signed)
[facility]  Chief Complaint  Patient presents with   Follow-up    Evaluation of lung mass    Constitutional:  BP 130/70 (BP Location: Left Arm, Cuff Size: Normal)   Pulse 88   Temp 98.5 F (36.9 C) (Oral)   Ht 5\' 6"  (1.676 m)   Wt 192 lb (87.1 kg)   SpO2 94%   BMI 30.99 kg/m   Past Medical History:  Hypertension  Past Surgical History:  He  has no past surgical history on file.  Brief Summary:  Maxwell Grant is a 57 y.o. male smoker with COPD, chronic respiratory failure and lung mass.      Subjective:   He is here with his wife.  He was in [facility] in January.  Had dyspnea and leg swelling.  He was found to have new Rt upper lung mass with Rt hilar and subcarinal lymphadenopathy.  His main complaint is related to back pain.  He is scheduled for an MRI tomorrow, but he isn't sure if he would be able to lay flat for an MRI.  He is not having cough, sputum, hemoptysis, or chest pain.  No issues with using Trilogy at night.  He uses 1.5 liters oxygen at night with Trilogy.    He hasn't smoked since he got out of the hospital.  Physical Exam:   Appearance - well kempt   ENMT - no sinus tenderness, no oral exudate, no LAN, Mallampati 3 airway, no stridor  Respiratory - equal breath sounds bilaterally, no wheezing or rales  CV - s1s2 regular rate and rhythm, no murmurs  Ext - no clubbing, no edema  Skin - no rashes  Psych - normal mood and affect     Pulmonary testing:  ABG on 3.5 liters 03/17/21 >> pH 7.48, PCO2 55.4, PO2 56.9 PFT 10/18/21 >> FEV1 1.65 (49%), FEV1% 48, TLC 6.57 (104%), RV 2.79 (138%), DLCO 61%  Chest Imaging:  V/Q scan 03/13/21 >> very low probability for PE CT angio chest 10/17/22 >> 5/6 x 5.6 RUL mass, 2.9 x 2.0 cm Rt hilar LAN, 2.4 x 1.6 cm subcarinal LAN, centrilobular emphysema  Sleep Tests:  Trilogy 03/18/21 to 10/09/21 >> used on 176 of 195 days with average 4 hrs 32 min.   Average Ti 1.4 sec, RR 21.    Cardiac Tests:  Echo 03/13/21 >> EF 60 to 65%, mild LVH  Social History:  He  reports that he has been smoking cigarettes. He has been smoking an average of 1 pack per day. He has never used smokeless tobacco. He reports current alcohol use of about 2.0 standard drinks of alcohol per week. He reports that he does not use drugs.  Family History:  His family history is not on file.     Assessment/Plan:   Rt upper lung mass with Rt hilar and subcarinal adenopathy. - main concern is for primary lung cancer - reviewed imaging with him - will arrange for bronchoscopy with endobronchial ultrasounds  COPD with emphysema. - continue anoro - prn albuterol  Tobacco abuse. - encouraged him to keep up with smoking cessation efforts  Chronic respiratory failure with hypoxia and hypercapnia. - using Trilogy home vent and 1.5 liters oxygen at night - TTV-VAPS-AE, min EPAP 5/max EPAP 15 cm H2O, Pinsp 5 cm H2O, Pinsp max 20 cm H2O, Ti min 0.7 sec/Ti max 2 sec with auto Ti, target Vt 500 ml - uses [facility]  Time  Spent Involved in Patient Care on Day of Examination:  36 minutes  Follow up:   Patient Instructions  Will working on getting endobronchial ultrasound with bronchoscopic biopsy set up at [facility]   Follow up in 6 weeks.  Medication List:   Allergies as of 10/23/2022       Reactions   Aspirin Diarrhea, Nausea And Vomiting   Penicillins Diarrhea, Nausea And Vomiting        Medication List        Accurate as of October 23, 2022  1:32 PM. If you have any questions, ask your nurse or doctor.          STOP taking these medications    Accu-Chek Guide test strip Generic drug: glucose blood Stopped by: Chesley Mires, MD   azithromycin 250 MG tablet Commonly known as: ZITHROMAX Stopped by: Chesley Mires, MD   chlorthalidone 25 MG tablet Commonly known as: HYGROTON Stopped by: Chesley Mires, MD   simvastatin 10 MG  tablet Commonly known as: ZOCOR Stopped by: Chesley Mires, MD       TAKE these medications    Accu-Chek FastClix Lancets Misc Use to test blood sugar 4 times a day   Accu-Chek Guide w/Device Kit Use to test blood sugar 4 times a day   albuterol 108 (90 Base) MCG/ACT inhaler Commonly known as: VENTOLIN HFA Inhale 1-2 puffs into the lungs every 4 (four) hours as needed for wheezing or shortness of breath.   amLODipine 10 MG tablet Commonly known as: NORVASC Take 10 mg by mouth daily.   Anoro Ellipta 62.5-25 MCG/ACT Aepb Generic drug: umeclidinium-vilanterol Inhale 1 puff into the lungs daily.   Anoro Ellipta 62.5-25 MCG/ACT Aepb Generic drug: umeclidinium-vilanterol Inhale 1 puff into the lungs daily.   glimepiride 2 MG tablet Commonly known as: AMARYL Take 2 mg by mouth daily.   linezolid 600 MG tablet Commonly known as: ZYVOX Take by mouth.   lisinopril 30 MG tablet Commonly known as: ZESTRIL Take 30 mg by mouth daily.   metoprolol tartrate 25 MG tablet Commonly known as: LOPRESSOR Take by mouth.   predniSONE 10 MG tablet Commonly known as: DELTASONE Take by mouth.        Signature:  Chesley Mires, MD [place] Pager - [phone] 10/23/2022, 1:32 PM

## 2022-10-23 NOTE — Telephone Encounter (Signed)
Discussed with Dr. Lamonte Sakai.  He had CT angio chest 10/17/22 at [facility] showing new 5.6 x 5.6 RUL mass, 2.9 x 2.0 cm Rt hilar LAN, 2.4 x 1.6 cm subcarinal LAN, centrilobular emphysema.    Dr. Lamonte Sakai will arrange for bronchoscopy with [facility]US to assess for primary lung cancer.

## 2022-10-23 NOTE — Patient Instructions (Signed)
Will working on getting endobronchial ultrasound with bronchoscopic biopsy set up at [facility]   Follow up in 6 weeks.

## 2022-10-23 NOTE — H&P (View-Only) (Signed)
[facility]  Chief Complaint  Patient presents with   Follow-up    Evaluation of lung mass    Constitutional:  BP 130/70 (BP Location: Left Arm, Cuff Size: Normal)   Pulse 88   Temp 98.5 F (36.9 C) (Oral)   Ht 5\' 6"  (1.676 m)   Wt 192 lb (87.1 kg)   SpO2 94%   BMI 30.99 kg/m   Past Medical History:  Hypertension  Past Surgical History:  He  has no past surgical history on file.  Brief Summary:  Maxwell Grant is a 57 y.o. male smoker with COPD, chronic respiratory failure and lung mass.      Subjective:   He is here with his wife.  He was in [facility] in January.  Had dyspnea and leg swelling.  He was found to have new Rt upper lung mass with Rt hilar and subcarinal lymphadenopathy.  His main complaint is related to back pain.  He is scheduled for an MRI tomorrow, but he isn't sure if he would be able to lay flat for an MRI.  He is not having cough, sputum, hemoptysis, or chest pain.  No issues with using Trilogy at night.  He uses 1.5 liters oxygen at night with Trilogy.    He hasn't smoked since he got out of the hospital.  Physical Exam:   Appearance - well kempt   ENMT - no sinus tenderness, no oral exudate, no LAN, Mallampati 3 airway, no stridor  Respiratory - equal breath sounds bilaterally, no wheezing or rales  CV - s1s2 regular rate and rhythm, no murmurs  Ext - no clubbing, no edema  Skin - no rashes  Psych - normal mood and affect     Pulmonary testing:  ABG on 3.5 liters 03/17/21 >> pH 7.48, PCO2 55.4, PO2 56.9 PFT 10/18/21 >> FEV1 1.65 (49%), FEV1% 48, TLC 6.57 (104%), RV 2.79 (138%), DLCO 61%  Chest Imaging:  V/Q scan 03/13/21 >> very low probability for PE CT angio chest 10/17/22 >> 5/6 x 5.6 RUL mass, 2.9 x 2.0 cm Rt hilar LAN, 2.4 x 1.6 cm subcarinal LAN, centrilobular emphysema  Sleep Tests:  Trilogy 03/18/21 to 10/09/21 >> used on 176 of 195 days with average 4 hrs 32 min.   Average Ti 1.4 sec, RR 21.    Cardiac Tests:  Echo 03/13/21 >> EF 60 to 65%, mild LVH  Social History:  He  reports that he has been smoking cigarettes. He has been smoking an average of 1 pack per day. He has never used smokeless tobacco. He reports current alcohol use of about 2.0 standard drinks of alcohol per week. He reports that he does not use drugs.  Family History:  His family history is not on file.     Assessment/Plan:   Rt upper lung mass with Rt hilar and subcarinal adenopathy. - main concern is for primary lung cancer - reviewed imaging with him - will arrange for bronchoscopy with endobronchial ultrasounds  COPD with emphysema. - continue anoro - prn albuterol  Tobacco abuse. - encouraged him to keep up with smoking cessation efforts  Chronic respiratory failure with hypoxia and hypercapnia. - using Trilogy home vent and 1.5 liters oxygen at night - TTV-VAPS-AE, min EPAP 5/max EPAP 15 cm H2O, Pinsp 5 cm H2O, Pinsp max 20 cm H2O, Ti min 0.7 sec/Ti max 2 sec with auto Ti, target Vt 500 ml - uses [facility]  Time  Spent Involved in Patient Care on Day of Examination:  36 minutes  Follow up:   Patient Instructions  Will working on getting endobronchial ultrasound with bronchoscopic biopsy set up at [facility]   Follow up in 6 weeks.  Medication List:   Allergies as of 10/23/2022       Reactions   Aspirin Diarrhea, Nausea And Vomiting   Penicillins Diarrhea, Nausea And Vomiting        Medication List        Accurate as of October 23, 2022  1:32 PM. If you have any questions, ask your nurse or doctor.          STOP taking these medications    Accu-Chek Guide test strip Generic drug: glucose blood Stopped by: Chesley Mires, MD   azithromycin 250 MG tablet Commonly known as: ZITHROMAX Stopped by: Chesley Mires, MD   chlorthalidone 25 MG tablet Commonly known as: HYGROTON Stopped by: Chesley Mires, MD   simvastatin 10 MG  tablet Commonly known as: ZOCOR Stopped by: Chesley Mires, MD       TAKE these medications    Accu-Chek FastClix Lancets Misc Use to test blood sugar 4 times a day   Accu-Chek Guide w/Device Kit Use to test blood sugar 4 times a day   albuterol 108 (90 Base) MCG/ACT inhaler Commonly known as: VENTOLIN HFA Inhale 1-2 puffs into the lungs every 4 (four) hours as needed for wheezing or shortness of breath.   amLODipine 10 MG tablet Commonly known as: NORVASC Take 10 mg by mouth daily.   Anoro Ellipta 62.5-25 MCG/ACT Aepb Generic drug: umeclidinium-vilanterol Inhale 1 puff into the lungs daily.   Anoro Ellipta 62.5-25 MCG/ACT Aepb Generic drug: umeclidinium-vilanterol Inhale 1 puff into the lungs daily.   glimepiride 2 MG tablet Commonly known as: AMARYL Take 2 mg by mouth daily.   linezolid 600 MG tablet Commonly known as: ZYVOX Take by mouth.   lisinopril 30 MG tablet Commonly known as: ZESTRIL Take 30 mg by mouth daily.   metoprolol tartrate 25 MG tablet Commonly known as: LOPRESSOR Take by mouth.   predniSONE 10 MG tablet Commonly known as: DELTASONE Take by mouth.        Signature:  Chesley Mires, MD [place] Pager - [phone] 10/23/2022, 1:32 PM

## 2022-10-24 ENCOUNTER — Encounter: Payer: Self-pay | Admitting: Emergency Medicine

## 2022-10-24 NOTE — Telephone Encounter (Signed)
Will work on scheduling bronchoscopy to evaluate a right upper lobe mass, right hilar lymphadenopathy, mediastinal adenopathy.  Will attempt to schedule for 10/30/2022

## 2022-10-26 ENCOUNTER — Other Ambulatory Visit: Payer: Self-pay

## 2022-10-26 ENCOUNTER — Other Ambulatory Visit: Payer: Medicaid Other

## 2022-10-26 ENCOUNTER — Other Ambulatory Visit: Payer: Self-pay | Admitting: *Deleted

## 2022-10-26 ENCOUNTER — Encounter (HOSPITAL_COMMUNITY): Payer: Self-pay | Admitting: Emergency Medicine

## 2022-10-26 DIAGNOSIS — Z01812 Encounter for preprocedural laboratory examination: Secondary | ICD-10-CM

## 2022-10-26 NOTE — Progress Notes (Addendum)
PCP - Dr. Bernerd Limbo Cardiologist - Denies Pulmonologist: Dr. Sylvan Cheese  PPM/ICD - Denies  Chest x-ray - 04/25/2021 EKG - 10/17/2022 at [facility]. Requested 10/26/2022, Received 10/26/2022 Stress Test - Denies ECHO - 03/13/21 Cardiac Cath - Denies  Denies ever having sleep study or being diagnosed with sleep apnea. However, patient does wear a CPAP  Denies having Diabetes, patient is taking Glimepride (Amaryl).  Does not check his blood sugar at home.   Blood Thinner Instructions: Denies Aspirin Instructions: Patient is allergic to ASA  ERAS Protcol - No  COVID TEST- Took one today, 10/26/2022, results are not back.   Anesthesia review: Yes. EKG ST from [place] (on chart). HTN and denies diabetes.  States uses rescue inhaler everyday. Uses CPAP at night. Finished course of Prednisone today, 10/26/2022.   Patient verbally denies any shortness of breath, fever, cough and chest pain during phone call     -------------  SDW INSTRUCTIONS given:  Your procedure is scheduled on Monday, October 30, 2022  Report to [facility] Main Entrance "A" at 5:30 A.M., and check in at the Admitting office.  Call this number if you have problems the morning of surgery:  [phone]   Remember:  Do not eat or drink after midnight the night before your surgery   Take these medicines the morning of surgery with A SIP OF WATER  amLODipine (NORVASC)   metoprolol tartrate (LOPRESSOR)  umeclidinium-vilanterol (ANORO ELLIPTA)   As Needed: albuterol (VENTOLIN HFA) , please bring to the hospital with you  As of today, STOP taking any Aleve, Naproxen, Ibuprofen, Motrin, Advil, Goody's, BC's, all herbal medications, fish oil, and all vitamins.   WHAT DO I DO ABOUT MY DIABETES MEDICATION?   Do Not take oral diabetes medicines (pills) the morning of surgery.   Do NOT take glimepiride (AMARYL)  oral diabetes medicine  the evening before OR the morning of surgery.  The day of surgery, do not take  other diabetes injectables, including Byetta (exenatide), Bydureon (exenatide ER), Victoza (liraglutide), or Trulicity (dulaglutide).  If your CBG is greater than 220 mg/dL, you may take  of your sliding scale (correction) dose of insulin.   HOW TO MANAGE YOUR DIABETES BEFORE AND AFTER SURGERY  Why is it important to control my blood sugar before and after surgery? Improving blood sugar levels before and after surgery helps healing and can limit problems. A way of improving blood sugar control is eating a healthy diet by:  Eating less sugar and carbohydrates  Increasing activity/exercise  Talking with your doctor about reaching your blood sugar goals High blood sugars (greater than 180 mg/dL) can raise your risk of infections and slow your recovery, so you will need to focus on controlling your diabetes during the weeks before surgery. Make sure that the doctor who takes care of your diabetes knows about your planned surgery including the date and location.  How do I manage my blood sugar before surgery? Check your blood sugar at least 4 times a day, starting 2 days before surgery, to make sure that the level is not too high or low.  Check your blood sugar the morning of your surgery when you wake up and every 2 hours until you get to the Short Stay unit.  If your blood sugar is less than 70 mg/dL, you will need to treat for low blood sugar: Do not take insulin. Treat a low blood sugar (less than 70 mg/dL) with  cup of clear juice (cranberry or apple),  4 glucose tablets, OR glucose gel. Recheck blood sugar in 15 minutes after treatment (to make sure it is greater than 70 mg/dL). If your blood sugar is not greater than 70 mg/dL on recheck, call [phone] for further instructions. Report your blood sugar to the short stay nurse when you get to Short Stay.  If you are admitted to the hospital after surgery: Your blood sugar will be checked by the staff and you will probably be given  insulin after surgery (instead of oral diabetes medicines) to make sure you have good blood sugar levels. The goal for blood sugar control after surgery is 80-180 mg/dL.                       Do not wear jewelry, make up, or nail polish            Do not wear lotions, powders, colognes, or deodorant.            Do not  Men may shave face and neck.            Do not bring valuables to the hospital.            [facility] is not responsible for any belongings or valuables.  Do NOT Smoke (Tobacco/Vaping) 24 hours prior to your procedure If you use a CPAP at night, you may bring all equipment for your overnight stay.   Contacts, glasses, dentures or bridgework may not be worn into surgery.      For patients admitted to the hospital, discharge time will be determined by your treatment team.   Patients discharged the day of surgery will not be allowed to drive home, and someone needs to stay with them for 24 hours.    Special instructions:   [place]- Preparing For Surgery  Before surgery, you can play an important role. Because skin is not sterile, your skin needs to be as free of germs as possible. You can reduce the number of germs on your skin by washing with CHG (chlorahexidine gluconate) Soap before surgery.  CHG is an antiseptic cleaner which kills germs and bonds with the skin to continue killing germs even after washing.    Oral Hygiene is also important to reduce your risk of infection.  Remember - BRUSH YOUR TEETH THE MORNING OF SURGERY WITH YOUR REGULAR TOOTHPASTE  Please follow these instructions carefully.   Shower the NIGHT BEFORE SURGERY and the MORNING OF SURGERY with DIAL Soap.   Pat yourself dry with a CLEAN TOWEL.  Wear CLEAN PAJAMAS to bed the night before surgery  Place CLEAN SHEETS on your bed the night of your first shower and DO NOT SLEEP WITH PETS.   Day of Surgery: Please shower morning of surgery  Wear Clean/Comfortable clothing the morning of  surgery Do not apply any deodorants/lotions.   Remember to brush your teeth WITH YOUR REGULAR TOOTHPASTE.   Questions were answered. Patient verbalized understanding of instructions.

## 2022-10-28 LAB — NOVEL CORONAVIRUS, NAA: SARS-CoV-2, NAA: NOT DETECTED

## 2022-10-29 ENCOUNTER — Encounter (HOSPITAL_COMMUNITY): Payer: Self-pay | Admitting: Emergency Medicine

## 2022-10-29 NOTE — Anesthesia Preprocedure Evaluation (Signed)
Anesthesia Evaluation  Patient identified by MRN, date of birth, ID band Patient awake    Reviewed: Allergy & Precautions, NPO status , Patient's Chart, lab work & pertinent test results, reviewed documented beta blocker date and time   Airway Mallampati: II       Dental  (+) Caps, Dental Advisory Given, Poor Dentition   Pulmonary sleep apnea and Continuous Positive Airway Pressure Ventilation , pneumonia, resolved, COPD,  COPD inhaler, Current Smoker and Patient abstained from smoking. RUL lung mass   breath sounds clear to auscultation + decreased breath sounds      Cardiovascular hypertension, Pt. on medications and Pt. on home beta blockers Normal cardiovascular exam Rhythm:Regular Rate:Normal     Neuro/Psych Left leg radicular pain negative neurological ROS  negative psych ROS   GI/Hepatic Neg liver ROS,,,Nausea this am   Endo/Other  diabetes, Well Controlled, Type 2, Oral Hypoglycemic Agents  Obesity Hyperlipidemia  Renal/GU Renal disease  negative genitourinary   Musculoskeletal Low back pain with radiculopathy Hx/o Staph infection osteomyelitis   Abdominal   Peds  Hematology negative hematology ROS (+)   Anesthesia Other Findings   Reproductive/Obstetrics                              Anesthesia Physical Anesthesia Plan  ASA: 3  Anesthesia Plan: General   Post-op Pain Management: Minimal or no pain anticipated   Induction: Intravenous  PONV Risk Score and Plan: 2 and Dexamethasone, Ondansetron, Treatment may vary due to age or medical condition and Midazolam  Airway Management Planned: Oral ETT  Additional Equipment: None  Intra-op Plan:   Post-operative Plan: Extubation in OR  Informed Consent: I have reviewed the patients History and Physical, chart, labs and discussed the procedure including the risks, benefits and alternatives for the proposed anesthesia with the  patient or authorized representative who has indicated his/her understanding and acceptance.     Dental advisory given  Plan Discussed with: CRNA and Anesthesiologist  Anesthesia Plan Comments:          Anesthesia Quick Evaluation

## 2022-10-30 ENCOUNTER — Other Ambulatory Visit: Payer: Self-pay | Admitting: Emergency Medicine

## 2022-10-30 ENCOUNTER — Ambulatory Visit (HOSPITAL_COMMUNITY): Payer: Medicaid Other

## 2022-10-30 ENCOUNTER — Ambulatory Visit (HOSPITAL_COMMUNITY): Payer: Medicaid Other | Admitting: Physician Assistant

## 2022-10-30 ENCOUNTER — Ambulatory Visit
Admission: RE | Admit: 2022-10-30 | Discharge: 2022-10-30 | Disposition: A | Discharge status: Home / Self Care | Payer: Self-pay | Source: Ambulatory Visit | Attending: Internal Medicine | Admitting: Internal Medicine

## 2022-10-30 ENCOUNTER — Ambulatory Visit (HOSPITAL_BASED_OUTPATIENT_CLINIC_OR_DEPARTMENT_OTHER): Payer: Medicaid Other | Admitting: Physician Assistant

## 2022-10-30 ENCOUNTER — Encounter (HOSPITAL_COMMUNITY)
Admission: RE | Disposition: A | Discharge status: Home / Self Care | Payer: Self-pay | Source: Home / Self Care | Attending: Emergency Medicine

## 2022-10-30 ENCOUNTER — Ambulatory Visit (HOSPITAL_COMMUNITY)
Admission: RE | Admit: 2022-10-30 | Discharge: 2022-10-30 | Disposition: A | Discharge status: Home / Self Care | Payer: Medicaid Other | Attending: Emergency Medicine | Admitting: Emergency Medicine

## 2022-10-30 ENCOUNTER — Other Ambulatory Visit: Payer: Self-pay

## 2022-10-30 DIAGNOSIS — E669 Obesity, unspecified: Secondary | ICD-10-CM | POA: Insufficient documentation

## 2022-10-30 DIAGNOSIS — I10 Essential (primary) hypertension: Secondary | ICD-10-CM

## 2022-10-30 DIAGNOSIS — R846 Abnormal cytological findings in specimens from respiratory organs and thorax: Secondary | ICD-10-CM | POA: Insufficient documentation

## 2022-10-30 DIAGNOSIS — J449 Chronic obstructive pulmonary disease, unspecified: Secondary | ICD-10-CM | POA: Diagnosis not present

## 2022-10-30 DIAGNOSIS — R590 Localized enlarged lymph nodes: Secondary | ICD-10-CM | POA: Diagnosis not present

## 2022-10-30 DIAGNOSIS — F17210 Nicotine dependence, cigarettes, uncomplicated: Secondary | ICD-10-CM

## 2022-10-30 DIAGNOSIS — J189 Pneumonia, unspecified organism: Secondary | ICD-10-CM | POA: Diagnosis not present

## 2022-10-30 DIAGNOSIS — Z6830 Body mass index (BMI) 30.0-30.9, adult: Secondary | ICD-10-CM | POA: Insufficient documentation

## 2022-10-30 DIAGNOSIS — R918 Other nonspecific abnormal finding of lung field: Secondary | ICD-10-CM

## 2022-10-30 DIAGNOSIS — G4730 Sleep apnea, unspecified: Secondary | ICD-10-CM | POA: Insufficient documentation

## 2022-10-30 DIAGNOSIS — E119 Type 2 diabetes mellitus without complications: Secondary | ICD-10-CM | POA: Diagnosis not present

## 2022-10-30 DIAGNOSIS — E785 Hyperlipidemia, unspecified: Secondary | ICD-10-CM | POA: Diagnosis not present

## 2022-10-30 HISTORY — PX: VIDEO BRONCHOSCOPY WITH ENDOBRONCHIAL ULTRASOUND: SHX6177

## 2022-10-30 HISTORY — PX: RADIOLOGY WITH ANESTHESIA: SHX6223

## 2022-10-30 HISTORY — DX: Type 2 diabetes mellitus without complications: E11.9

## 2022-10-30 HISTORY — PX: BRONCHIAL NEEDLE ASPIRATION BIOPSY: SHX5106

## 2022-10-30 LAB — GLUCOSE, CAPILLARY
Glucose-Capillary: 161 mg/dL — ABNORMAL HIGH (ref 70–99)
Glucose-Capillary: 167 mg/dL — ABNORMAL HIGH (ref 70–99)

## 2022-10-30 SURGERY — BRONCHOSCOPY, WITH EBUS
Anesthesia: General | Laterality: Right

## 2022-10-30 MED ORDER — INSULIN ASPART 100 UNIT/ML IJ SOLN
0.0000 [IU] | INTRAMUSCULAR | Status: DC | PRN
  Administered 2022-10-30: 2 [IU] via SUBCUTANEOUS
  Filled 2022-10-30: qty 1

## 2022-10-30 MED ORDER — DROPERIDOL 2.5 MG/ML IJ SOLN: 0.6250 mg | Freq: Once | INTRAMUSCULAR | Status: DC | PRN

## 2022-10-30 MED ORDER — LIDOCAINE 2% (20 MG/ML) 5 ML SYRINGE
INTRAMUSCULAR | Status: DC | PRN
  Administered 2022-10-30: 80 mg via INTRAVENOUS

## 2022-10-30 MED ORDER — GADOBUTROL 1 MMOL/ML IV SOLN
8.0000 mL | Freq: Once | INTRAVENOUS | Status: AC | PRN
  Administered 2022-10-30: 8 mL via INTRAVENOUS

## 2022-10-30 MED ORDER — SUGAMMADEX SODIUM 200 MG/2ML IV SOLN
INTRAVENOUS | Status: DC | PRN
  Administered 2022-10-30: 200 mg via INTRAVENOUS

## 2022-10-30 MED ORDER — DEXAMETHASONE SODIUM PHOSPHATE 10 MG/ML IJ SOLN
INTRAMUSCULAR | Status: DC | PRN
  Administered 2022-10-30: 4 mg via INTRAVENOUS

## 2022-10-30 MED ORDER — HYDROMORPHONE HCL 1 MG/ML IJ SOLN: 0.2500 mg | INTRAMUSCULAR | Status: DC | PRN

## 2022-10-30 MED ORDER — FENTANYL CITRATE (PF) 100 MCG/2ML IJ SOLN
INTRAMUSCULAR | Status: DC | PRN
  Administered 2022-10-30 (×2): 50 ug via INTRAVENOUS

## 2022-10-30 MED ORDER — DROPERIDOL 2.5 MG/ML IJ SOLN
INTRAMUSCULAR | Status: DC | PRN
  Administered 2022-10-30: .625 mg via INTRAVENOUS

## 2022-10-30 MED ORDER — LACTATED RINGERS IV SOLN: INTRAVENOUS | Status: DC

## 2022-10-30 MED ORDER — MIDAZOLAM HCL 5 MG/5ML IJ SOLN
INTRAMUSCULAR | Status: DC | PRN
  Administered 2022-10-30: 2 mg via INTRAVENOUS

## 2022-10-30 MED ORDER — OXYCODONE HCL 5 MG PO TABS: 5.0000 mg | ORAL_TABLET | Freq: Once | ORAL | Status: DC | PRN

## 2022-10-30 MED ORDER — PROPOFOL 10 MG/ML IV BOLUS
INTRAVENOUS | Status: DC | PRN
  Administered 2022-10-30: 50 mg via INTRAVENOUS
  Administered 2022-10-30: 150 mg via INTRAVENOUS

## 2022-10-30 MED ORDER — PROPOFOL 500 MG/50ML IV EMUL
INTRAVENOUS | Status: DC | PRN
  Administered 2022-10-30: 100 ug/kg/min via INTRAVENOUS

## 2022-10-30 MED ORDER — OXYCODONE HCL 5 MG/5ML PO SOLN: 5.0000 mg | Freq: Once | ORAL | Status: DC | PRN

## 2022-10-30 MED ORDER — ROCURONIUM BROMIDE 10 MG/ML (PF) SYRINGE
PREFILLED_SYRINGE | INTRAVENOUS | Status: DC | PRN
  Administered 2022-10-30 (×2): 20 mg via INTRAVENOUS
  Administered 2022-10-30: 60 mg via INTRAVENOUS

## 2022-10-30 MED ORDER — ONDANSETRON HCL 4 MG/2ML IJ SOLN
INTRAMUSCULAR | Status: DC | PRN
  Administered 2022-10-30: 4 mg via INTRAVENOUS

## 2022-10-30 NOTE — Anesthesia Postprocedure Evaluation (Signed)
Anesthesia Post Note  Patient: Maxwell Grant  Procedure(s) Performed: VIDEO BRONCHOSCOPY WITH ENDOBRONCHIAL ULTRASOUND (Right) MRI WITH ANESTHESIA W/WO CONTRAST BRONCHIAL NEEDLE ASPIRATION BIOPSIES     Patient location during evaluation: PACU Anesthesia Type: General Level of consciousness: awake and alert and oriented Pain management: pain level controlled Vital Signs Assessment: post-procedure vital signs reviewed and stable Respiratory status: spontaneous breathing, nonlabored ventilation and respiratory function stable Cardiovascular status: blood pressure returned to baseline and stable Postop Assessment: no apparent nausea or vomiting Anesthetic complications: no   No notable events documented.  Last Vitals:  Vitals:   10/30/22 1100 10/30/22 1115  BP: 137/65 125/74  Pulse: 92 93  Resp: (!) 26 (!) 23  Temp:    SpO2: 92% 93%    Last Pain:  Vitals:   10/30/22 1032  TempSrc:   PainSc: Asleep                 Avryl Roehm A.

## 2022-10-30 NOTE — Anesthesia Procedure Notes (Signed)
Procedure Name: Intubation Date/Time: 10/30/2022 7:48 AM  Performed by: Wilburn Cornelia, CRNAPre-anesthesia Checklist: Patient identified, Emergency Drugs available, Suction available, Patient being monitored and Timeout performed Patient Re-evaluated:Patient Re-evaluated prior to induction Oxygen Delivery Method: Circle system utilized Preoxygenation: Pre-oxygenation with 100% oxygen Induction Type: IV induction Ventilation: Mask ventilation without difficulty Laryngoscope Size: Mac and 4 Grade View: Grade II Tube type: Oral Tube size: 8.5 mm Number of attempts: 1 Airway Equipment and Method: Stylet Placement Confirmation: ETT inserted through vocal cords under direct vision, positive ETCO2, CO2 detector and breath sounds checked- equal and bilateral Secured at: 23 cm Tube secured with: Tape Dental Injury: Teeth and Oropharynx as per pre-operative assessment

## 2022-10-30 NOTE — Op Note (Signed)
Video Bronchoscopy with Endobronchial Ultrasound Procedure Note  Date of Operation: 10/30/2022  Pre-op Diagnosis: Right upper lobe mass, mediastinal adenopathy  Post-op Diagnosis: Same  Surgeon: Baltazar Apo  Assistants: None  Anesthesia: General endotracheal anesthesia  Operation: Flexible video fiberoptic bronchoscopy with endobronchial ultrasound and biopsies.  Estimated Blood Loss: Minimal  Complications: None apparent  Indications and History: Maxwell Grant is a 57 y.o. male with history of tobacco use, COPD, chronic respiratory failure, lumbar neuropathy.  Found to have a right upper lobe mass with distal adenopathy on chest imaging.  Recommendation made to achieve tissue diagnosis via bronchoscopy with endobronchial ultrasound.  The risks, benefits, complications, treatment options and expected outcomes were discussed with the patient.  The possibilities of pneumothorax, pneumonia, reaction to medication, pulmonary aspiration, perforation of a viscus, bleeding, failure to diagnose a condition and creating a complication requiring transfusion or operation were discussed with the patient who freely signed the consent.    Description of Procedure: The patient was examined in the preoperative area and history and data from the preprocedure consultation were reviewed. It was deemed appropriate to proceed.  The patient was taken to [facility] endoscopy room 3, identified as [name] and the procedure verified as Flexible Video Fiberoptic Bronchoscopy.  A Time Out was held and the above information confirmed. After being taken to the operating room general anesthesia was initiated and the patient  was orally intubated. The video fiberoptic bronchoscope was introduced via the endotracheal tube and a general inspection was performed which showed normal left-sided airways.  This was some slight edema in hypervascularity of the mucosa at the right upper lobe orifice.  There was no discrete  endobronchial lesion.  The remaining right-sided airways were normal.. The standard scope was then withdrawn and the endobronchial ultrasound was used to identify and characterize the peritracheal, hilar and bronchial lymph nodes. Inspection showed a small 4L node, significant enlargement at stations 4R, 7, 10 R. Using real-time ultrasound guidance Wang needle biopsies were take from [place], 7, 10 R nodes and were sent for cytology.  Guardant360 blood test was obtained and sent for molecular testing.  The patient tolerated the procedure well without apparent complications. There was no significant blood loss. The bronchoscope was withdrawn.  The patient will go to MRI to have imaging of the lumbar spine under general anesthesia.  Once he returns anesthesia will be reversed and the patient was taken to the PACU for recovery.   Samples: 1. Wang needle biopsies from 4R node 2. Wang needle biopsies from 7 node 3. Wang needle biopsies from 10 R node  Plans:  The patient will be discharged from the PACU to home when recovered from anesthesia. We will review the cytology, pathology results with the patient when they become available. Outpatient followup will be with Dr. Halford Chessman.    Baltazar Apo, MD, PhD 10/30/2022, 8:52 AM [facility] [phone] or if no answer before 7:00PM call [phone] For any issues after 7:00PM please call eLink [phone]

## 2022-10-30 NOTE — Transfer of Care (Signed)
Immediate Anesthesia Transfer of Care Note  Patient: Maxwell Grant  Procedure(s) Performed: VIDEO BRONCHOSCOPY WITH ENDOBRONCHIAL ULTRASOUND (Right) MRI WITH ANESTHESIA W/WO CONTRAST BRONCHIAL NEEDLE ASPIRATION BIOPSIES  Patient Location: PACU  Anesthesia Type:General  Level of Consciousness: awake, drowsy, and patient cooperative  Airway & Oxygen Therapy: Patient Spontanous Breathing and Patient connected to face mask oxygen  Post-op Assessment: Report given to RN and Post -op Vital signs reviewed and stable  Post vital signs: Reviewed and stable  Last Vitals: SEE PACU VITALS Vitals Value Taken Time  BP    Temp    Pulse    Resp    SpO2      Last Pain:  Vitals:   10/30/22 0643  TempSrc:   PainSc: 8       Patients Stated Pain Goal: 3 (02/54/27 0623)  Complications: No notable events documented.

## 2022-10-30 NOTE — Interval H&P Note (Signed)
History and Physical Interval Note:  10/30/2022 7:20 AM  Maxwell Grant  has presented today for surgery, with the diagnosis of RIGHT UPPER LOBE LUNG MASS.  The various methods of treatment have been discussed with the patient and family. After consideration of risks, benefits and other options for treatment, the patient has consented to  Procedure(s): [place] (Right) as a surgical intervention.  The patient's history has been reviewed, patient examined, no change in status, stable for surgery.  I have reviewed the patient's chart and labs.  Questions were answered to the patient's satisfaction.     Collene Gobble

## 2022-10-30 NOTE — Progress Notes (Signed)
Referral to Oncology and Rad Onc made

## 2022-10-30 NOTE — Progress Notes (Signed)
Sent request to [facility] via fax (([phone]) for CT imaging be uploaded to [facility]. Spoke to [place] at [place] [phone]). Verified that request was received and the images will be available soon. Orders for images placed.

## 2022-10-30 NOTE — Discharge Instructions (Addendum)
Flexible Bronchoscopy, Care After This sheet gives you information about how to care for yourself after your test. Your doctor may also give you more specific instructions. If you have problems or questions, contact your doctor. Follow these instructions at home: Eating and drinking When your numbness is gone and your cough and gag reflexes have come back, you may: Eat only soft foods. Slowly drink liquids. The day after the test, go back to your normal diet. Driving Do not drive for 24 hours if you were given a medicine to help you relax (sedative). Do not drive or use heavy machinery while taking prescription pain medicine. General instructions  Take over-the-counter and prescription medicines only as told by your doctor. Return to your normal activities as told. Ask what activities are safe for you. Do not use any products that have nicotine or tobacco in them. This includes cigarettes and e-cigarettes. If you need help quitting, ask your doctor. Keep all follow-up visits as told by your doctor. This is important. It is very important if you had a tissue sample (biopsy) taken. Get help right away if: You have shortness of breath that gets worse. You get light-headed. You feel like you are going to pass out (faint). You have chest pain. You cough up: More than a little blood. More blood than before. Summary Do not eat or drink anything (not even water) for 2 hours after your test, or until your numbing medicine wears off. Do not use cigarettes. Do not use e-cigarettes. Get help right away if you have chest pain.  Please call our office for any questions or concerns.  [phone].   This information is not intended to replace advice given to you by your health care provider. Make sure you discuss any questions you have with your health care provider. Document Released: 07/02/2009 Document Revised: 08/17/2017 Document Reviewed: 09/22/2016 Elsevier Patient Education  2020 [employer]  

## 2022-10-31 ENCOUNTER — Telehealth: Payer: Self-pay | Admitting: Radiation Oncology

## 2022-10-31 ENCOUNTER — Telehealth: Payer: Self-pay | Admitting: Internal Medicine

## 2022-10-31 NOTE — Telephone Encounter (Signed)
Called patient to schedule consult per 2/12 referral. Patient requested I call his wife to schedule. Left voicemail for patient's wife to call back to schedule consult.

## 2022-10-31 NOTE — Telephone Encounter (Signed)
Scheduled appt per 2/12 referral. Pt is aware of appt date and time. Pt is aware to arrive 15 mins prior to appt time and to bring and updated insurance card. Pt is aware of appt location.

## 2022-11-01 ENCOUNTER — Telehealth: Payer: Self-pay | Admitting: Emergency Medicine

## 2022-11-01 ENCOUNTER — Encounter (HOSPITAL_COMMUNITY): Payer: Self-pay | Admitting: Emergency Medicine

## 2022-11-01 ENCOUNTER — Telehealth: Payer: Self-pay | Admitting: Internal Medicine

## 2022-11-01 ENCOUNTER — Telehealth: Payer: Self-pay | Admitting: Radiation Oncology

## 2022-11-01 ENCOUNTER — Other Ambulatory Visit: Payer: Self-pay

## 2022-11-01 DIAGNOSIS — R918 Other nonspecific abnormal finding of lung field: Secondary | ICD-10-CM

## 2022-11-01 NOTE — Telephone Encounter (Signed)
Left message for patient's wife to call back to schedule consult per 2/12 referral.

## 2022-11-01 NOTE — Telephone Encounter (Signed)
Preliminary info from [facility] from 2/12 >> [facility], further stains are pending. Anticipate a dx this week. He has OV with Dr Julien Nordmann tomorrow. I've also referred him to Rad Oncology because his MRI spine shows mets. Guardant 360 was sent on 2/12.   FYI to Dr Julien Nordmann

## 2022-11-01 NOTE — Telephone Encounter (Signed)
R/s pt's appt per wife's request. She is aware of new appt date/time.

## 2022-11-02 ENCOUNTER — Encounter: Payer: Self-pay | Admitting: Internal Medicine

## 2022-11-02 ENCOUNTER — Telehealth: Payer: Self-pay | Admitting: Radiation Oncology

## 2022-11-02 ENCOUNTER — Inpatient Hospital Stay: Payer: Medicaid Other

## 2022-11-02 ENCOUNTER — Inpatient Hospital Stay: Payer: Medicaid Other | Attending: Internal Medicine | Admitting: Internal Medicine

## 2022-11-02 ENCOUNTER — Other Ambulatory Visit: Payer: Self-pay

## 2022-11-02 VITALS — BP 110/73 | HR 117 | Temp 98.4°F | Resp 17 | Wt 190.3 lb

## 2022-11-02 DIAGNOSIS — F1290 Cannabis use, unspecified, uncomplicated: Secondary | ICD-10-CM | POA: Diagnosis not present

## 2022-11-02 DIAGNOSIS — E119 Type 2 diabetes mellitus without complications: Secondary | ICD-10-CM | POA: Diagnosis not present

## 2022-11-02 DIAGNOSIS — Z825 Family history of asthma and other chronic lower respiratory diseases: Secondary | ICD-10-CM | POA: Insufficient documentation

## 2022-11-02 DIAGNOSIS — Z8249 Family history of ischemic heart disease and other diseases of the circulatory system: Secondary | ICD-10-CM | POA: Insufficient documentation

## 2022-11-02 DIAGNOSIS — Z886 Allergy status to analgesic agent status: Secondary | ICD-10-CM | POA: Diagnosis not present

## 2022-11-02 DIAGNOSIS — E785 Hyperlipidemia, unspecified: Secondary | ICD-10-CM

## 2022-11-02 DIAGNOSIS — M47816 Spondylosis without myelopathy or radiculopathy, lumbar region: Secondary | ICD-10-CM | POA: Insufficient documentation

## 2022-11-02 DIAGNOSIS — M5136 Other intervertebral disc degeneration, lumbar region: Secondary | ICD-10-CM | POA: Diagnosis not present

## 2022-11-02 DIAGNOSIS — Z5111 Encounter for antineoplastic chemotherapy: Secondary | ICD-10-CM | POA: Diagnosis present

## 2022-11-02 DIAGNOSIS — M533 Sacrococcygeal disorders, not elsewhere classified: Secondary | ICD-10-CM | POA: Diagnosis not present

## 2022-11-02 DIAGNOSIS — C7800 Secondary malignant neoplasm of unspecified lung: Secondary | ICD-10-CM | POA: Diagnosis not present

## 2022-11-02 DIAGNOSIS — Z818 Family history of other mental and behavioral disorders: Secondary | ICD-10-CM | POA: Insufficient documentation

## 2022-11-02 DIAGNOSIS — Z888 Allergy status to other drugs, medicaments and biological substances status: Secondary | ICD-10-CM | POA: Insufficient documentation

## 2022-11-02 DIAGNOSIS — Z880 Allergy status to penicillin: Secondary | ICD-10-CM | POA: Insufficient documentation

## 2022-11-02 DIAGNOSIS — Z833 Family history of diabetes mellitus: Secondary | ICD-10-CM | POA: Insufficient documentation

## 2022-11-02 DIAGNOSIS — Z79899 Other long term (current) drug therapy: Secondary | ICD-10-CM | POA: Insufficient documentation

## 2022-11-02 DIAGNOSIS — M25559 Pain in unspecified hip: Secondary | ICD-10-CM | POA: Insufficient documentation

## 2022-11-02 DIAGNOSIS — Z5112 Encounter for antineoplastic immunotherapy: Secondary | ICD-10-CM | POA: Diagnosis present

## 2022-11-02 DIAGNOSIS — C7951 Secondary malignant neoplasm of bone: Secondary | ICD-10-CM | POA: Insufficient documentation

## 2022-11-02 DIAGNOSIS — F17210 Nicotine dependence, cigarettes, uncomplicated: Secondary | ICD-10-CM | POA: Diagnosis not present

## 2022-11-02 DIAGNOSIS — C3490 Malignant neoplasm of unspecified part of unspecified bronchus or lung: Secondary | ICD-10-CM | POA: Insufficient documentation

## 2022-11-02 DIAGNOSIS — M549 Dorsalgia, unspecified: Secondary | ICD-10-CM

## 2022-11-02 DIAGNOSIS — I10 Essential (primary) hypertension: Secondary | ICD-10-CM

## 2022-11-02 DIAGNOSIS — R0600 Dyspnea, unspecified: Secondary | ICD-10-CM

## 2022-11-02 DIAGNOSIS — R918 Other nonspecific abnormal finding of lung field: Secondary | ICD-10-CM

## 2022-11-02 DIAGNOSIS — C3411 Malignant neoplasm of upper lobe, right bronchus or lung: Secondary | ICD-10-CM | POA: Insufficient documentation

## 2022-11-02 DIAGNOSIS — M48061 Spinal stenosis, lumbar region without neurogenic claudication: Secondary | ICD-10-CM | POA: Diagnosis not present

## 2022-11-02 DIAGNOSIS — R059 Cough, unspecified: Secondary | ICD-10-CM

## 2022-11-02 DIAGNOSIS — R590 Localized enlarged lymph nodes: Secondary | ICD-10-CM | POA: Diagnosis not present

## 2022-11-02 DIAGNOSIS — R5383 Other fatigue: Secondary | ICD-10-CM

## 2022-11-02 LAB — COMPREHENSIVE METABOLIC PANEL
ALT: 20 U/L (ref 0–44)
AST: 11 U/L — ABNORMAL LOW (ref 15–41)
Albumin: 3.6 g/dL (ref 3.5–5.0)
Alkaline Phosphatase: 84 U/L (ref 38–126)
Anion gap: 7 (ref 5–15)
BUN: 17 mg/dL (ref 6–20)
CO2: 32 mmol/L (ref 22–32)
Calcium: 12.1 mg/dL — ABNORMAL HIGH (ref 8.9–10.3)
Chloride: 100 mmol/L (ref 98–111)
Creatinine, Ser: 0.88 mg/dL (ref 0.61–1.24)
GFR, Estimated: >60 mL/min (ref >60)
Glucose, Bld: 125 mg/dL — ABNORMAL HIGH (ref 70–99)
Potassium: 4.1 mmol/L (ref 3.5–5.1)
Sodium: 139 mmol/L (ref 135–145)
Total Bilirubin: 0.6 mg/dL (ref 0.3–1.2)
Total Protein: 6.6 g/dL (ref 6.5–8.1)

## 2022-11-02 LAB — CBC WITH DIFFERENTIAL/PLATELET
Abs Immature Granulocytes: 0.07 10*3/uL (ref 0.00–0.07)
Basophils Absolute: 0.1 10*3/uL (ref 0.0–0.1)
Basophils Relative: 1 %
Eosinophils Absolute: 0.9 10*3/uL — ABNORMAL HIGH (ref 0.0–0.5)
Eosinophils Relative: 6 %
HCT: 42.2 % (ref 39.0–52.0)
Hemoglobin: 14.3 g/dL (ref 13.0–17.0)
Immature Granulocytes: 1 %
Lymphocytes Relative: 10 %
Lymphs Abs: 1.4 10*3/uL (ref 0.7–4.0)
MCH: 29.6 pg (ref 26.0–34.0)
MCHC: 33.9 g/dL (ref 30.0–36.0)
MCV: 87.4 fL (ref 80.0–100.0)
Monocytes Absolute: 1.6 10*3/uL — ABNORMAL HIGH (ref 0.1–1.0)
Monocytes Relative: 11 %
Neutro Abs: 10.1 10*3/uL — ABNORMAL HIGH (ref 1.7–7.7)
Neutrophils Relative %: 71 %
Platelets: 105 10*3/uL — ABNORMAL LOW (ref 150–400)
RBC: 4.83 MIL/uL (ref 4.22–5.81)
RDW: 14.4 % (ref 11.5–15.5)
WBC: 14.1 10*3/uL — ABNORMAL HIGH (ref 4.0–10.5)
nRBC: 0 % (ref 0.0–0.2)

## 2022-11-02 LAB — CYTOLOGY - NON PAP

## 2022-11-02 MED ORDER — PROCHLORPERAZINE MALEATE 10 MG PO TABS: 10.0000 mg | ORAL_TABLET | Freq: Four times a day (QID) | ORAL | 0 refills | Status: DC | PRN

## 2022-11-02 MED ORDER — FOLIC ACID 1 MG PO TABS: 1.0000 mg | ORAL_TABLET | Freq: Every day | ORAL | 4 refills | Status: DC

## 2022-11-02 NOTE — Telephone Encounter (Signed)
Left message for patient's wife to call back per patient's request to schedule consult per 2/12 referral.

## 2022-11-02 NOTE — Telephone Encounter (Signed)
Left message for patient's sister to call back to schedule consult per 2/12 referral.

## 2022-11-02 NOTE — Progress Notes (Signed)
[place] Telephone:([phone]   Fax:([phone]) [phone]  CONSULT NOTE  REFERRING PHYSICIAN: Dr. Baltazar Apo  REASON FOR CONSULTATION:  57 years old white male recently diagnosed with lung cancer  HPI Maxwell Grant is a 57 y.o. male with past medical history significant for COPD, diabetes mellitus, hypertension, dyslipidemia as well as history of osteomyelitis of the back treated more than 15 years ago.  The patient was complaining of back pain and treated by his primary care physician with Flexeril but he has more confusion when he was on CPAP.  He presented to the hospital for evaluation and he had CT angiogram of the chest at [facility] on October 17, 2022 and that showed enlarging right upper lobe mass measuring 5.6 x 5.6 cm.  There was also right hilar adenopathy measuring 2.0 x 2.9 cm and subcarinal node measuring 1.6 x 2.4 cm.  He had CT scan of the head without contrast that showed no evidence of intracranial abnormalities.  Of note that the patient had CT angiogram of the chest on September 01, 2018 that showed a small right lung nodule measuring up to 0.6 cm at that time.  He was referred to Dr. Lamonte Sakai and on October 30, 2022 he underwent video bronchoscopy with endobronchial ultrasound procedure.  The final pathology ([id number]) showed malignant cells consistent with non-small cell carcinoma. The tumor cells are strongly and diffusely positive for TTF-1,which is compatible with adenocarcinoma.  The tumor cells are negative for p40.  However, the tumor cells show very focal CK5/6 positivity and thus focal squamous differentiation and/or an adenosquamous carcinoma cannot be entirely ruled out.  Because of his back pain Dr. Lamonte Sakai order MRI of the lumbar spine as well as the pelvis which was performed on 10/30/2022 and that showed extensive osseous metastatic disease most notably involving the left iliac bone with extensive tumor involvement of the adjacent pelvic  muscles.  Other lesions involving the left sacrum, the right iliac bone and the L5 vertebral body and left femur with left-sided pelvic adenopathy.  There was also an large lesion involving the L5 vertebral body with suspected enhancing epidural tumor but no significant spinal stenosis.  There was left-sided T11 bone lesion and extensive metastatic disease involving the posterior elements of the L2 on the left.  There was also small but bulky metastatic adenopathy and involving the retroperitoneum.  There was moderately severe spinal and bilateral recess stenosis at L2-3 and L4-5. The patient was referred to me today for evaluation and recommendation regarding treatment of his condition. When seen today he continues to complain of pain in the hip area as lower back.  He has it for long time but recently was getting worse.  He has no chest pain but has shortness of breath and mild cough with no sputum or hemoptysis.  He has no nausea, vomiting, diarrhea or constipation.  He has no headache or visual changes. Family history significant for mother with diabetes mellitus, hypertension and Alzheimer.  Father had diabetes mellitus hypertension and COPD. The patient is single and has 3 daughters.  He works as a Chief Strategy Officer.  He was accompanied today by his Sister Maxwell Grant.  The patient has a history for smoking 1 pack/day for around 45 years and unfortunately continues to smoke.  He also has a history of alcohol abuse but not recently and no history of drug abuse.  HPI  Past Medical History:  Diagnosis Date   COPD (chronic obstructive pulmonary disease) ([place])  Diabetes mellitus without complication ([place])    Hypertension     Past Surgical History:  Procedure Laterality Date   BRONCHIAL NEEDLE ASPIRATION BIOPSY  10/30/2022   Procedure: BRONCHIAL NEEDLE ASPIRATION BIOPSIES;  Surgeon: Collene Gobble, MD;  Location: [place];  Service: Cardiopulmonary;;   NO PAST SURGERIES     RADIOLOGY WITH ANESTHESIA   10/30/2022   Procedure: MRI WITH ANESTHESIA W/WO CONTRAST;  Surgeon: Collene Gobble, MD;  Location: [facility] ENDOSCOPY;  Service: Cardiopulmonary;;   VIDEO BRONCHOSCOPY WITH ENDOBRONCHIAL ULTRASOUND Right 10/30/2022   Procedure: VIDEO BRONCHOSCOPY WITH ENDOBRONCHIAL ULTRASOUND;  Surgeon: Collene Gobble, MD;  Location: [place];  Service: Cardiopulmonary;  Laterality: Right;    No family history on file.  Social History Social History   Tobacco Use   Smoking status: Every Day    Packs/day: 2.00    Types: Cigarettes   Smokeless tobacco: Never   Tobacco comments:    Smokes 10-20 cigs a day. 06/13/2022. [place]  Vaping Use   Vaping Use: Never used  Substance Use Topics   Alcohol use: Yes    Alcohol/week: 1.0 standard drink of alcohol    Types: 1 Standard drinks or equivalent per week   Drug use: Yes    Types: Marijuana    Allergies  Allergen Reactions   Statins Other (See Comments)    Joint pains   Aspirin Diarrhea and Nausea And Vomiting   Flexeril [Cyclobenzaprine] Other (See Comments)    hallucinations   Penicillins Diarrhea and Nausea And Vomiting    Current Outpatient Medications  Medication Sig Dispense Refill   albuterol (VENTOLIN HFA) 108 (90 Base) MCG/ACT inhaler Inhale 1-2 puffs into the lungs every 4 (four) hours as needed for wheezing or shortness of breath.     glimepiride (AMARYL) 2 MG tablet Take 2 mg by mouth in the morning.     lisinopril (ZESTRIL) 30 MG tablet Take 30 mg by mouth in the morning.     metoprolol tartrate (LOPRESSOR) 25 MG tablet Take 25 mg by mouth 2 (two) times daily.     oxyCODONE-acetaminophen (PERCOCET) 7.5-325 MG tablet Take 1 tablet by mouth every 8 (eight) hours as needed for severe pain.     umeclidinium-vilanterol (ANORO ELLIPTA) 62.5-25 MCG/ACT AEPB Inhale 1 puff into the lungs daily. 60 each 11   Accu-Chek FastClix Lancets MISC Use to test blood sugar 4 times a day (Patient not taking: Reported on 11/02/2022) 102 each 0   amLODipine  (NORVASC) 10 MG tablet Take 10 mg by mouth in the morning. (Patient not taking: Reported on 11/02/2022)     blood glucose meter kit and supplies KIT Use to test blood sugar 4 times a day (Patient not taking: Reported on 11/02/2022) 1 each 0   No current facility-administered medications for this visit.    Review of Systems  Constitutional: positive for fatigue Eyes: negative Ears, nose, mouth, throat, and face: negative Respiratory: positive for cough and dyspnea on exertion Cardiovascular: negative Gastrointestinal: negative Genitourinary:negative Integument/breast: negative Hematologic/lymphatic: negative Musculoskeletal:positive for back pain Neurological: negative Behavioral/Psych: negative Endocrine: negative Allergic/Immunologic: negative  Physical Exam  TZG:YFVCB, healthy, no distress, well nourished, well developed, and anxious SKIN: skin color, texture, turgor are normal, no rashes or significant lesions HEAD: Normocephalic, No masses, lesions, tenderness or abnormalities EYES: normal, PERRLA, Conjunctiva are pink and non-injected EARS: External ears normal, Canals clear OROPHARYNX:no exudate, no erythema, and lips, buccal mucosa, and tongue normal  NECK: supple, no adenopathy, no JVD LYMPH:  no palpable  lymphadenopathy, no hepatosplenomegaly LUNGS: clear to auscultation , and palpation HEART: regular rate & rhythm, no murmurs, and no gallops ABDOMEN:abdomen soft, non-tender, normal bowel sounds, and no masses or organomegaly BACK: Back symmetric, no curvature., No CVA tenderness EXTREMITIES:no joint deformities, effusion, or inflammation, no edema  NEURO: alert & oriented x 3 with fluent speech, no focal motor/sensory deficits  PERFORMANCE STATUS: ECOG 1  LABORATORY DATA: Lab Results  Component Value Date   WBC 14.1 (H) 11/02/2022   HGB 14.3 11/02/2022   HCT 42.2 11/02/2022   MCV 87.4 11/02/2022   PLT 105 (L) 11/02/2022      Chemistry      Component Value  Date/Time   NA 139 11/02/2022 1320   K 4.1 11/02/2022 1320   CL 100 11/02/2022 1320   CO2 32 11/02/2022 1320   BUN 17 11/02/2022 1320   CREATININE 0.88 11/02/2022 1320      Component Value Date/Time   CALCIUM 12.1 (H) 11/02/2022 1320   ALKPHOS 84 11/02/2022 1320   AST 11 (L) 11/02/2022 1320   ALT 20 11/02/2022 1320   BILITOT 0.6 11/02/2022 1320       RADIOGRAPHIC STUDIES: MR Lumbar Spine W Wo Contrast  Result Date: 10/30/2022 CLINICAL DATA:  Back pain.  History of right lung mass EXAM: MRI LUMBAR SPINE WITHOUT AND WITH CONTRAST TECHNIQUE: Multiplanar and multiecho pulse sequences of the lumbar spine were obtained without and with intravenous contrast. CONTRAST:  24mL GADAVIST GADOBUTROL 1 MMOL/ML IV SOLN COMPARISON:  Pelvic MRI, same date. FINDINGS: Segmentation: There are five lumbar type vertebral bodies. The last full intervertebral disc space is labeled L5-S1. Alignment: Normal alignment of the lumbar vertebral bodies. Straightening of the normal lumbar lordosis. Vertebrae: Enhancing metastatic bone disease extensively involving the pelvis. There is a large lesion involving the L5 vertebral body with suspected enhancing epidural tumor but no significant canal stenosis. There is also a left-sided T11 bone lesion and extensive tumor involving and destroying the posterior elements on the left at L3 and extending out into the adjacent musculature. This involves the posterior aspect of the pedicle. The left facet joint and the transverse process. No canal stenosis. Conus medullaris and cauda equina: Conus extends to the T12-L1 level. Conus and cauda equina appear normal. Paraspinal and other soft tissues: Small but likely metastatic adenopathy involving the retroperitoneum. The largest node is inter aortocaval and measures 9 mm. Disc levels: L1-2: Bulging degenerated annulus and facet disease with mild bilateral lateral recess stenosis but no significant spinal or foraminal stenosis. L2-3: Diffuse  bulging degenerated annulus, moderate facet disease and ligamentum flavum thickening contributing to moderately severe spinal and bilateral lateral recess stenosis. No foraminal stenosis. L3-4 the disc space is fused.  No spinal or foraminal stenosis. L4-5: Bulging annulus and facet disease contributing to moderate spinal and bilateral lateral recess stenosis without significant foraminal stenosis. L5-S1: Lesion in the L5 vertebral body with suspected epidural tumor but no significant spinal stenosis. There is also extensive tumor involving the left upper sacrum with extensive tumor in the left paraspinal muscles. IMPRESSION: 1. Enhancing metastatic bone disease extensively involving the pelvis. 2. Large lesion involving the L5 vertebral body with suspected enhancing epidural tumor but no significant spinal stenosis. 3. Left-sided T11 bone lesion. 4. Extensive metastatic disease involving the posterior elements of L2 on the left as detailed above. No canal stenosis. 5. Small but likely metastatic adenopathy involving the retroperitoneum. 6. Moderately severe spinal and bilateral lateral recess stenosis at L2-3. 7. Moderate spinal and  bilateral lateral recess stenosis at L4-5. Electronically Signed   By: Marijo Sanes M.D.   On: 10/30/2022 11:06   MR PELVIS W WO CONTRAST  Result Date: 10/30/2022 CLINICAL DATA:  Left-sided pelvic pain.  History of right lung mass. EXAM: MRI PELVIS WITHOUT AND WITH CONTRAST TECHNIQUE: Multiplanar multisequence MR imaging of the pelvis was performed both before and after administration of intravenous contrast. CONTRAST:  50mL GADAVIST GADOBUTROL 1 MMOL/ML IV SOLN COMPARISON:  None Available. FINDINGS: Urinary Tract: The bladder is unremarkable. No bladder mass or calculi. Bowel: The rectum, sigmoid colon and visualized small-bowel loops are grossly. Vascular/Lymphatic: The major vascular structures are patent. No aneurysm. No lower retroperitoneal adenopathy. Enlarged left external  iliac node measures 10 mm on image 15/17. 7.5 mm pelvic sidewall node on image 18/17. No inguinal adenopathy. Reproductive: The prostate gland and seminal vesicles are unremarkable. Other:  No intrapelvic mass or free pelvic fluid collections. Musculoskeletal: Large destructive bone lesion involving the left iliac bone extending out into the adjacent iliacus and gluteus muscles. This is a large aggressive necrotic appearing tumor with very heterogeneous contrast enhancement extensively involving the muscles of the left pelvis. There is also a large lesion involving the left sacrum which is partially destroyed. There is also a smaller lesion involving the right iliac bone with cortical breakthrough and involvement of the right iliacus muscle. There is also a lesion involving the L5 vertebral body but no canal stenosis. There does appear to be enhancing epidural tumor on the sagittal postcontrast sequence. Small lesion involving the head neck junction region of the left hip. IMPRESSION: 1. Extensive osseous metastatic disease most notably involving the left iliac bone with extensive tumor involvement of the adjacent pelvic muscles. 2. Other lesions involving the left sacrum, the right iliac bone, the L5 vertebral body and the left femur. 3. Left-sided pelvic adenopathy. Electronically Signed   By: Marijo Sanes M.D.   On: 10/30/2022 10:58    ASSESSMENT: This is a very pleasant 57 years old white male recently diagnosed with a stage IV (T3, N2, M1 C) non-small cell lung cancer, adenocarcinoma presented with large right upper lobe lung mass in addition to right hilar and subcarinal lymphadenopathy in addition to multiple metastatic bone lesions involving the pelvis as well as the lower thoracic and lumbar spines in addition to metastatic disease to the pelvic muscles diagnosed in February 2024.  Pending additional staging workup.   PLAN: I had a lengthy discussion with the patient and his sister today about his  current disease stage, prognosis and treatment options. I personally and independently reviewed the scan images and discussed the results and showed the images to the patient and his sister. I recommended for the patient to complete the staging workup by ordering a PET scan.  He may also need MRI of the brain with and without contrast but the patient cannot lay flat for the MRI. Dr. Lamonte Sakai did send his blood test as well as tissue to [place] for molecular studies and these results are still pending. I explained to the patient that he has incurable condition and all the treatment will be of palliative nature. He was given the option of palliative care and hospice referral versus consideration of palliative systemic chemotherapy with carboplatin for AUC of 5, Alimta 500 Mg/M2 and Keytruda 200 Mg IV every 3 weeks if the molecular studies are negative for actionable mutations.  The patient is interested in the treatment but we will not start the first cycle of his  treatment until the availability of the molecular studies at least by the blood test. I discussed with the patient the adverse effect of the chemotherapy including but not limited to alopecia, myelosuppression, nausea and vomiting, peripheral neuropathy, liver or renal dysfunction as well as immunotherapy adverse effects. He is expected to start the first cycle of this treatment on November 14, 2022. The patient will receive vitamin B12 injection and have a chemotherapy location class before the first dose of his treatment. I will call his pharmacy with prescription for folic acid 1 mg p.o. daily in addition to Compazine 10 mg p.o. every 6 hours as needed for nausea. For the back pain, he will continue his current pain medication by Dr. Coletta Memos until the patient sees the palliative care clinic hopefully next week.  I also referred the patient to radiation oncology for consideration of palliative radiotherapy to this area. I will see him back for  follow-up visit with the start of the first cycle of his treatment. For the bone disease and hypercalcemia, I will arrange for the patient to receive 1 L of normal saline and Zometa infusion in the next few days.  He will need a dental clearance for continuation of his treatment with Xgeva or Zometa. The patient was advised to call immediately if he has any other concerning symptoms in the interval. The patient voices understanding of current disease status and treatment options and is in agreement with the current care plan.  All questions were answered. The patient knows to call the clinic with any problems, questions or concerns. We can certainly see the patient much sooner if necessary.  Thank you so much for allowing me to participate in the care of Rhett Mutschler. I will continue to follow up the patient with you and assist in his care.  The total time spent in the appointment was 90 minutes.  Disclaimer: This note was dictated with voice recognition software. Similar sounding words can inadvertently be transcribed and may not be corrected upon review.   Eilleen Kempf November 02, 2022, 2:23 PM

## 2022-11-02 NOTE — Progress Notes (Signed)
Navigator met this pt for the first time today at his med onc consult with Dr Julien Nordmann. Pt is accompanied by his sister, Vaughan Basta. Pt is sitting in wheelchair as he has difficulty walking d/t severe back pain. Navigator introduced herself and explained role in context of the pt's cancer care. Navigator explained that a referral for palliative can will be placed so his pain control can be managed through the cancer clinic. A referral to radiation oncology is in place and they should be reaching out to him soon. The patient and his sister request that Vaughan Basta be made the first contact as she will be the one helping to schedule his appts and provide him transportation. Navigator made this change in pts demographics section. Pt is aware that he needs a PET scan and Navigator assured the pt it will be scheduled and the navigator will contact them when it has been scheduled. Navigator provided contact information and encouraged the pt and/or his sister to reach out at any time with questions or concerns.

## 2022-11-02 NOTE — Progress Notes (Signed)
START ON PATHWAY REGIMEN - Non-Small Cell Lung     A cycle is every 21 days:     Pemetrexed      Carboplatin   **Always confirm dose/schedule in your pharmacy ordering system**  Patient Characteristics: Stage IV Metastatic, Nonsquamous, Awaiting Molecular Test Results and Need to Start Chemotherapy, PS = 0, 1 Therapeutic Status: Stage IV Metastatic Histology: Nonsquamous Cell Broad Molecular Profiling Status: Awaiting Molecular Test Results and Need to Start Chemotherapy ECOG Performance Status: 1 Intent of Therapy: Non-Curative / Palliative Intent, Discussed with Patient

## 2022-11-03 ENCOUNTER — Other Ambulatory Visit: Payer: Self-pay

## 2022-11-03 ENCOUNTER — Inpatient Hospital Stay (HOSPITAL_BASED_OUTPATIENT_CLINIC_OR_DEPARTMENT_OTHER): Payer: Medicaid Other | Admitting: Physician Assistant

## 2022-11-03 ENCOUNTER — Inpatient Hospital Stay: Payer: Medicaid Other

## 2022-11-03 ENCOUNTER — Other Ambulatory Visit: Payer: Self-pay | Admitting: Physician Assistant

## 2022-11-03 DIAGNOSIS — R609 Edema, unspecified: Secondary | ICD-10-CM

## 2022-11-03 DIAGNOSIS — Z5112 Encounter for antineoplastic immunotherapy: Secondary | ICD-10-CM | POA: Diagnosis not present

## 2022-11-03 DIAGNOSIS — C3490 Malignant neoplasm of unspecified part of unspecified bronchus or lung: Secondary | ICD-10-CM

## 2022-11-03 MED ORDER — ZOLEDRONIC ACID 4 MG/100ML IV SOLN
4.0000 mg | Freq: Once | INTRAVENOUS | Status: AC
  Administered 2022-11-03: 4 mg via INTRAVENOUS
  Filled 2022-11-03: qty 100

## 2022-11-03 MED ORDER — FUROSEMIDE 20 MG PO TABS: 20.0000 mg | ORAL_TABLET | Freq: Once | ORAL | 0 refills | Status: DC | PRN

## 2022-11-03 MED ORDER — SODIUM CHLORIDE 0.9 % IV SOLN: INTRAVENOUS | Status: DC

## 2022-11-03 MED ORDER — CYANOCOBALAMIN 1000 MCG/ML IJ SOLN
1000.0000 ug | Freq: Once | INTRAMUSCULAR | Status: AC
  Administered 2022-11-03: 1000 ug via INTRAMUSCULAR
  Filled 2022-11-03: qty 1

## 2022-11-03 MED ORDER — SODIUM CHLORIDE 0.9 % IV SOLN: Freq: Once | INTRAVENOUS | Status: AC

## 2022-11-03 NOTE — Progress Notes (Signed)
I saw the patient in the infusion room today.  He was in the infusion room getting IV fluids and Zometa due to hypercalcemia.  The patient had discussed possible Lasix with Dr. Julien Nordmann yesterday per patient report.  Discussed this with the patient and his sister in the infusion room today.  His systolic blood pressure is soft at 759 systolic, although he did take his antihypertensives today.  He is scheduled to see his PCP next week about medication adjustments.  I encouraged the patient to monitor his pressure daily prior to taking his antihypertensives and to keep a log of his readings to share with his PCP to see if dose adjustments are necessary.  Regarding the Lasix, I can send him a few tablets to take 1 tablet daily as needed for swelling, however he is advised not to take this if his systolic blood pressure is less than 120.  I also advised to elevate his lower extremities, avoid salty food, and to purchase consider compression stockings.  If he does take Lasix pill, he was given a handout of potassium rich food and was he was encouraged to increase his dietary intake of potassium.   His sister was asking about sedation for his PET scan.  PET scan is currently scheduled for 2/26.  I let them know that the PET scan is shorter than the MRI and it would be ideal to not undergo sedation his PET scan since this likely would require [place] to delay his PET scan to a later date.  It would be ideal if we can manage his pain so he is able to lay on the table for his PET scan.  I will discuss this with Dr. Julien Nordmann on Monday.   They also asked about a Port-A-Cath.  I reviewed a Port-A-Cath with the patient and his sister.  However, we are still waiting on his results from his molecular studies to see if he has any targetable mutations.  Therefore, I would advise getting a Port-A-Cath at this time until we have the results of his molecular studies.  If he is not have any actionable mutations, then I think it would be  reasonable to have a port and I be happy to send the Emla cream to his pharmacy and arrange for the procedure.

## 2022-11-03 NOTE — Progress Notes (Signed)
Pt's sister, Vaughan Basta, called navigator with questions regarding the pts appt with Dr Julien Nordmann yesterday. She asked about the lasix Dr Julien Nordmann had mentioned about possibly ordering for the patient for the swelling in his legs. Navigator explained that Dr.Mohamed isn't in the office today, but that I would message him. She also asked that radiation had called and she wanted to know what radiation would be for. I explained that the radiation would be to areas of his back causing him pain. Vaughan Basta also mentioned an appt with radiation at 3 today, however navigator checked his chart and let her know it was an appt for the pt to get IVF and an IV medication to help lower the pt's calcium level. I let her know that the B12 injection the pt needs for his chemotherapy regimen can be given during his appt for his IVF today. Vaughan Basta verbalized understanding.  Navigator spoke to [employer], [place], about the swelling in the pts lower extremities and it was mentioned during his appt with Dr Julien Nordmann about possibility of getting lasix to take as needed. PA Heilingoetter states that she will see him at his infusion appt to assess him.  Navigator called Vaughan Basta back and informed her that the pt will be seeing Dr Worthy Flank PA when he's here for his IVF. Vaughan Basta verbalized understanding.

## 2022-11-05 ENCOUNTER — Other Ambulatory Visit: Payer: Self-pay

## 2022-11-07 ENCOUNTER — Telehealth: Payer: Self-pay | Admitting: Internal Medicine

## 2022-11-07 NOTE — Telephone Encounter (Signed)
Called patient regarding upcoming appointments, patient is notified. 

## 2022-11-08 ENCOUNTER — Inpatient Hospital Stay: Payer: Medicaid Other | Admitting: Internal Medicine

## 2022-11-08 ENCOUNTER — Encounter: Payer: Self-pay | Admitting: Internal Medicine

## 2022-11-08 ENCOUNTER — Other Ambulatory Visit: Payer: Self-pay | Admitting: Internal Medicine

## 2022-11-08 ENCOUNTER — Other Ambulatory Visit: Payer: Self-pay | Admitting: Medical Oncology

## 2022-11-08 ENCOUNTER — Inpatient Hospital Stay: Payer: Medicaid Other

## 2022-11-08 ENCOUNTER — Telehealth: Payer: Self-pay

## 2022-11-08 DIAGNOSIS — I878 Other specified disorders of veins: Secondary | ICD-10-CM

## 2022-11-08 DIAGNOSIS — R918 Other nonspecific abnormal finding of lung field: Secondary | ICD-10-CM

## 2022-11-08 MED ORDER — LIDOCAINE-PRILOCAINE 2.5-2.5 % EX KIT: PACK | CUTANEOUS | 2 refills | Status: DC | PRN

## 2022-11-08 NOTE — Progress Notes (Addendum)
[facility]  [phone] [phone]   Name: Maxwell Grant Date: 11/08/2022 MRN: [id number]  DOB: 1965/09/20  Patient Care Team: Bernerd Limbo, MD as PCP - General (Family Medicine)    REASON FOR CONSULTATION: Maxwell Grant is a 57 y.o. male with oncologic medical history including new diagnosed non-small cell lung cancer (10/2022), as well as a history of COPD, diabetes mellitus, hypertension, dyslipidemia as well as history of osteomyelitis of the back.  Palliative ask to see for symptom and pain management and goals of care.    SOCIAL HISTORY:     reports that he has been smoking cigarettes. He has been smoking an average of 2 packs per day. He has never used smokeless tobacco. He reports current alcohol use of about 1.0 standard drink of alcohol per week. He reports current drug use. Drug: Marijuana.  ADVANCE DIRECTIVES:  None on file  CODE STATUS:   PAST MEDICAL HISTORY: Past Medical History:  Diagnosis Date   COPD (chronic obstructive pulmonary disease) ([place])    Diabetes mellitus without complication ([place])    Hypertension     PAST SURGICAL HISTORY:  Past Surgical History:  Procedure Laterality Date   BRONCHIAL NEEDLE ASPIRATION BIOPSY  10/30/2022   Procedure: BRONCHIAL NEEDLE ASPIRATION BIOPSIES;  Surgeon: Collene Gobble, MD;  Location: [facility];  Service: Cardiopulmonary;;   NO PAST SURGERIES     RADIOLOGY WITH ANESTHESIA  10/30/2022   Procedure: MRI WITH ANESTHESIA W/WO CONTRAST;  Surgeon: Collene Gobble, MD;  Location: [facility];  Service: Cardiopulmonary;;   VIDEO BRONCHOSCOPY WITH ENDOBRONCHIAL ULTRASOUND Right 10/30/2022   Procedure: VIDEO BRONCHOSCOPY WITH ENDOBRONCHIAL ULTRASOUND;  Surgeon: Collene Gobble, MD;  Location: [facility];  Service: Cardiopulmonary;  Laterality: Right;    HEMATOLOGY/ONCOLOGY HISTORY:  Oncology History  Malignant neoplasm of unspecified part of unspecified  bronchus or lung ([place])  11/02/2022 Initial Diagnosis   Malignant neoplasm of unspecified part of unspecified bronchus or lung ([place])   11/14/2022 -  Chemotherapy   Patient is on Treatment Plan : LUNG Carboplatin (5) + Pemetrexed (500) + Pembrolizumab (200) D1 q21d Induction x 4 cycles / Maintenance Pemetrexed (500) + Pembrolizumab (200) D1 q21d       ALLERGIES:  is allergic to statins, aspirin, flexeril [cyclobenzaprine], and penicillins.  MEDICATIONS:  Current Outpatient Medications  Medication Sig Dispense Refill   Accu-Chek FastClix Lancets MISC Use to test blood sugar 4 times a day (Patient not taking: Reported on 11/02/2022) 102 each 0   albuterol (VENTOLIN HFA) 108 (90 Base) MCG/ACT inhaler Inhale 1-2 puffs into the lungs every 4 (four) hours as needed for wheezing or shortness of breath.     amLODipine (NORVASC) 10 MG tablet Take 10 mg by mouth in the morning. (Patient not taking: Reported on 11/02/2022)     blood glucose meter kit and supplies KIT Use to test blood sugar 4 times a day (Patient not taking: Reported on [id number]) 1 each 0   folic acid (FOLVITE) 1 MG tablet Take 1 tablet (1 mg total) by mouth daily. 30 tablet 4   furosemide (LASIX) 20 MG tablet Take 1 tablet (20 mg total) by mouth once as needed for up to 1 dose. As long as your systolic blood pressure is >120 7 tablet 0   glimepiride (AMARYL) 2 MG tablet Take 2 mg by mouth in the morning.     lisinopril (ZESTRIL) 30 MG tablet Take 30 mg by mouth in the morning.  metoprolol tartrate (LOPRESSOR) 25 MG tablet Take 25 mg by mouth 2 (two) times daily.     oxyCODONE-acetaminophen (PERCOCET) 7.5-325 MG tablet Take 1 tablet by mouth every 8 (eight) hours as needed for severe pain.     prochlorperazine (COMPAZINE) 10 MG tablet Take 1 tablet (10 mg total) by mouth every 6 (six) hours as needed for nausea or vomiting. 30 tablet 0   umeclidinium-vilanterol (ANORO ELLIPTA) 62.5-25 MCG/ACT AEPB Inhale 1 puff into the lungs daily. 60  each 11   No current facility-administered medications for this visit.    VITAL SIGNS: There were no vitals taken for this visit. There were no vitals filed for this visit.  Estimated body mass index is 30.72 kg/m as calculated from the following:   Height as of 10/30/22: '5\' 6"'$  (1.676 m).   Weight as of 11/02/22: 190 lb 4.8 oz (86.3 kg).  LABS: CBC:    Component Value Date/Time   WBC 14.1 (H) 11/02/2022 1320   HGB 14.3 11/02/2022 1320   HCT 42.2 11/02/2022 1320   PLT 105 (L) 11/02/2022 1320   MCV 87.4 11/02/2022 1320   NEUTROABS 10.1 (H) 11/02/2022 1320   LYMPHSABS 1.4 11/02/2022 1320   MONOABS 1.6 (H) 11/02/2022 1320   EOSABS 0.9 (H) 11/02/2022 1320   BASOSABS 0.1 11/02/2022 1320   Comprehensive Metabolic Panel:    Component Value Date/Time   NA 139 11/02/2022 1320   K 4.1 11/02/2022 1320   CL 100 11/02/2022 1320   CO2 32 11/02/2022 1320   BUN 17 11/02/2022 1320   CREATININE 0.88 11/02/2022 1320   GLUCOSE 125 (H) 11/02/2022 1320   CALCIUM 12.1 (H) 11/02/2022 1320   AST 11 (L) 11/02/2022 1320   ALT 20 11/02/2022 1320   ALKPHOS 84 11/02/2022 1320   BILITOT 0.6 11/02/2022 1320   PROT 6.6 11/02/2022 1320   ALBUMIN 3.6 11/02/2022 1320    RADIOGRAPHIC STUDIES: MR Lumbar Spine W Wo Contrast  Result Date: 10/30/2022 CLINICAL DATA:  Back pain.  History of right lung mass EXAM: MRI LUMBAR SPINE WITHOUT AND WITH CONTRAST TECHNIQUE: Multiplanar and multiecho pulse sequences of the lumbar spine were obtained without and with intravenous contrast. CONTRAST:  61m GADAVIST GADOBUTROL 1 MMOL/ML IV SOLN COMPARISON:  Pelvic MRI, same date. FINDINGS: Segmentation: There are five lumbar type vertebral bodies. The last full intervertebral disc space is labeled L5-S1. Alignment: Normal alignment of the lumbar vertebral bodies. Straightening of the normal lumbar lordosis. Vertebrae: Enhancing metastatic bone disease extensively involving the pelvis. There is a large lesion involving the L5  vertebral body with suspected enhancing epidural tumor but no significant canal stenosis. There is also a left-sided T11 bone lesion and extensive tumor involving and destroying the posterior elements on the left at L3 and extending out into the adjacent musculature. This involves the posterior aspect of the pedicle. The left facet joint and the transverse process. No canal stenosis. Conus medullaris and cauda equina: Conus extends to the T12-L1 level. Conus and cauda equina appear normal. Paraspinal and other soft tissues: Small but likely metastatic adenopathy involving the retroperitoneum. The largest node is inter aortocaval and measures 9 mm. Disc levels: L1-2: Bulging degenerated annulus and facet disease with mild bilateral lateral recess stenosis but no significant spinal or foraminal stenosis. L2-3: Diffuse bulging degenerated annulus, moderate facet disease and ligamentum flavum thickening contributing to moderately severe spinal and bilateral lateral recess stenosis. No foraminal stenosis. L3-4 the disc space is fused.  No spinal or foraminal stenosis. L4-5:  Bulging annulus and facet disease contributing to moderate spinal and bilateral lateral recess stenosis without significant foraminal stenosis. L5-S1: Lesion in the L5 vertebral body with suspected epidural tumor but no significant spinal stenosis. There is also extensive tumor involving the left upper sacrum with extensive tumor in the left paraspinal muscles. IMPRESSION: 1. Enhancing metastatic bone disease extensively involving the pelvis. 2. Large lesion involving the L5 vertebral body with suspected enhancing epidural tumor but no significant spinal stenosis. 3. Left-sided T11 bone lesion. 4. Extensive metastatic disease involving the posterior elements of L2 on the left as detailed above. No canal stenosis. 5. Small but likely metastatic adenopathy involving the retroperitoneum. 6. Moderately severe spinal and bilateral lateral recess stenosis  at L2-3. 7. Moderate spinal and bilateral lateral recess stenosis at L4-5. Electronically Signed   By: Marijo Sanes M.D.   On: 10/30/2022 11:06   MR PELVIS W WO CONTRAST  Result Date: 10/30/2022 CLINICAL DATA:  Left-sided pelvic pain.  History of right lung mass. EXAM: MRI PELVIS WITHOUT AND WITH CONTRAST TECHNIQUE: Multiplanar multisequence MR imaging of the pelvis was performed both before and after administration of intravenous contrast. CONTRAST:  25m GADAVIST GADOBUTROL 1 MMOL/ML IV SOLN COMPARISON:  None Available. FINDINGS: Urinary Tract: The bladder is unremarkable. No bladder mass or calculi. Bowel: The rectum, sigmoid colon and visualized small-bowel loops are grossly. Vascular/Lymphatic: The major vascular structures are patent. No aneurysm. No lower retroperitoneal adenopathy. Enlarged left external iliac node measures 10 mm on image 15/17. 7.5 mm pelvic sidewall node on image 18/17. No inguinal adenopathy. Reproductive: The prostate gland and seminal vesicles are unremarkable. Other:  No intrapelvic mass or free pelvic fluid collections. Musculoskeletal: Large destructive bone lesion involving the left iliac bone extending out into the adjacent iliacus and gluteus muscles. This is a large aggressive necrotic appearing tumor with very heterogeneous contrast enhancement extensively involving the muscles of the left pelvis. There is also a large lesion involving the left sacrum which is partially destroyed. There is also a smaller lesion involving the right iliac bone with cortical breakthrough and involvement of the right iliacus muscle. There is also a lesion involving the L5 vertebral body but no canal stenosis. There does appear to be enhancing epidural tumor on the sagittal postcontrast sequence. Small lesion involving the head neck junction region of the left hip. IMPRESSION: 1. 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 2. Other lesions involving the left sacrum, the right iliac bone, the L5 vertebral body and the left femur. 3. Left-sided pelvic adenopathy. Electronically Signed   By: PMarijo SanesM.D.   On: 10/30/2022 10:58    PERFORMANCE STATUS (ECOG) : {CHL ONC ECOG [id number] Review of Systems Unless otherwise noted, a complete review of systems is negative.  Physical Exam General: NAD Cardiovascular: regular rate and rhythm Pulmonary: clear ant fields Abdomen: soft, nontender, + bowel sounds GU: no suprapubic tenderness Extremities: no edema, no joint deformities Skin: no rashes Neurological:  IMPRESSION: *** I introduced myself, [place] RN, and Palliative's role in collaboration with the oncology team. Concept of Palliative Care was introduced as specialized medical care for people and their families living with serious illness.  It focuses on providing relief from the symptoms and stress of a serious illness.  The goal is to improve quality of life for both the patient and the family. Values and goals of care important to patient and family were attempted to  be elicited.    We discussed *** current illness and what it means in the larger context of Her on-going co-morbidities. Natural disease trajectory and expectations were discussed.  I discussed the importance of continued conversation with family and their medical providers regarding overall plan of care and treatment options, ensuring decisions are within the context of the patients values and GOCs.  PLAN: Established therapeutic relationship. Education provided on palliative's role in collaboration with their Oncology/Radiation team. I will plan to see patient back in 2-4 weeks in collaboration to other oncology appointments.    Patient expressed understanding and was in agreement with this plan. He also understands that He can call the clinic at any time with any questions, concerns, or complaints.    Thank you for your referral and allowing Palliative to assist in Mrs. Tavish Mcgoey care.   Number and complexity of problems addressed: ***HIGH - 1 or more chronic illnesses with SEVERE exacerbation, progression, or side effects of treatment - advanced cancer, pain. Any controlled substances utilized were prescribed in the context of palliative care.  Time Total: ***  Visit consisted of counseling and education dealing with the complex and emotionally intense issues of symptom management and palliative care in the setting of serious and potentially life-threatening illness.Greater than 50%  of this time was spent counseling and coordinating care related to the above assessment and plan.  Signed by: Alda Lea, AGPCNP-BC Palliative Medicine Team/[place]

## 2022-11-08 NOTE — Progress Notes (Signed)
Pt's sister, Vaughan Basta, called the navigator with concerns about how the patient will be able to get his PET scan since he is unable to lie flat due to his back pain. Navigator told Vaughan Basta that she will talk to Dr Julien Nordmann about her concerns and call her back. Vaughan Basta did mention that the pt's PCM did order oxycodone for the pt and it has provided him some relief.  Navigator returned a call to [place] and explained, that per Dr Julien Nordmann, that at the moment, since his PCM is currently managing his pain medication, there aren't any recommendations he can make at this time. However, the pt does meet with Palliative Care on 2/22, who will take over managing his pain.  Pts sister verbalized understanding of the information provided. She is also reminded that pt has a chemo education class at 2pm on 2/22 as well. Pts sister verbalized understanding. Navigator encouraged her or the patient to call with any questions or concerns.

## 2022-11-08 NOTE — Telephone Encounter (Signed)
Spoke sister  with Gaylan Gerold.  Told her that Dr. Julien Nordmann had received enough molecular studies at this time to say a Port a cath placement would be fine. Abelina Bachelor his nurse place order.  They will hear from Interventional radiology to schedule.  Mr Hudnall will probably have his first treatment peripherally. The EMLA Cream to numb the Port site prior to access was sent in to his [facility].  The education nurse to morrow will discuss when and how the EMLA is applied to the [place]. Verified with Ms Nori Riis that the appointment that was cancelled for tomorrow was the injection appointment as Mr. Kroening received the B-12 injection on 11-03-22.  Ms Nori Riis verbalized understanding.

## 2022-11-09 ENCOUNTER — Inpatient Hospital Stay: Payer: Medicaid Other

## 2022-11-09 ENCOUNTER — Other Ambulatory Visit (HOSPITAL_COMMUNITY): Payer: Self-pay

## 2022-11-09 ENCOUNTER — Encounter: Payer: Self-pay | Admitting: Internal Medicine

## 2022-11-09 ENCOUNTER — Inpatient Hospital Stay (HOSPITAL_BASED_OUTPATIENT_CLINIC_OR_DEPARTMENT_OTHER): Payer: Medicaid Other | Admitting: Nurse Practitioner

## 2022-11-09 ENCOUNTER — Encounter: Payer: Self-pay | Admitting: Nurse Practitioner

## 2022-11-09 ENCOUNTER — Other Ambulatory Visit: Payer: Self-pay

## 2022-11-09 VITALS — BP 131/78 | HR 100 | Temp 98.2°F | Resp 16

## 2022-11-09 DIAGNOSIS — C3490 Malignant neoplasm of unspecified part of unspecified bronchus or lung: Secondary | ICD-10-CM

## 2022-11-09 DIAGNOSIS — Z515 Encounter for palliative care: Secondary | ICD-10-CM | POA: Diagnosis not present

## 2022-11-09 DIAGNOSIS — F419 Anxiety disorder, unspecified: Secondary | ICD-10-CM | POA: Diagnosis not present

## 2022-11-09 DIAGNOSIS — G893 Neoplasm related pain (acute) (chronic): Secondary | ICD-10-CM

## 2022-11-09 DIAGNOSIS — R630 Anorexia: Secondary | ICD-10-CM

## 2022-11-09 DIAGNOSIS — R530 Neoplastic (malignant) related fatigue: Secondary | ICD-10-CM

## 2022-11-09 DIAGNOSIS — Z7189 Other specified counseling: Secondary | ICD-10-CM

## 2022-11-09 MED ORDER — LORAZEPAM 0.5 MG PO TABS
0.5000 mg | ORAL_TABLET | Freq: Every day | ORAL | 0 refills | Status: DC | PRN
  Filled 2022-11-09: qty 4, 4d supply, fill #0

## 2022-11-09 MED ORDER — METHOCARBAMOL 500 MG PO TABS
500.0000 mg | ORAL_TABLET | Freq: Two times a day (BID) | ORAL | 0 refills | Status: DC | PRN
  Filled 2022-11-09: qty 10, 5d supply, fill #0

## 2022-11-09 MED ORDER — OXYCODONE-ACETAMINOPHEN 7.5-325 MG PO TABS
1.0000 | ORAL_TABLET | Freq: Four times a day (QID) | ORAL | 0 refills | Status: DC | PRN
  Filled 2022-11-09: qty 60, 15d supply, fill #0

## 2022-11-09 NOTE — Patient Instructions (Addendum)
-   for constipation take mirilax twice a day and colace once a day, if you have diarrhea drop the mirilax down to once a day - try to have a protein shake once a day to make sure you are getting your nutrition in, and try 5-6 smaller meals throughout the day, which might help with bloating - continue to take the oxycontin (extended release) every morning and evening regardless of pain level - increase your percocet (oxycodone-acetaminophen, short acting) to take every 6 hours as needed for breakthrough pain - call us with any questions or concerns at [phone] - give [place] a call 2-3 days prior to you running out of medication so we can call in a refill  - 15-56mm compression socks that are thigh high might also help with swelling - toady only take one extra lasix, tomorrow take your 40mg  (2 pills) as normal and continue with your every other day - pick up ativan and take 30 mins prior to your pet scan - pick up and take robaxin as prescribed for muscle spasms and pain, take at least an hour apart from your oxycodone and percocet to see if it helps, if so take one Sunday night prior to your PET scan - IF you cannot tolerate the PET scan call the office to see if we can give you something to help make you comfortable [phone]

## 2022-11-09 NOTE — Progress Notes (Unsigned)
Pt. and sister Vaughan Basta here for chemotherapy education. Pt states it has been a long day and he is not able to stay for entire session. RN reviewed home medications and treatment plan for Pembrolizumab, Pemetrexed, and Carboplatin. Discussed port insertion on 11/17/22 and when and how to use Emla cream. Per pt. Request, the education packet was quickly reviewed and given to pt. Pt. declined to reschedule education session, states he will be here for treatment and review packet information prior to first day. Instructed to call for any concerns or issues. Dr. Worthy Flank RN notified.

## 2022-11-10 ENCOUNTER — Inpatient Hospital Stay: Payer: Medicaid Other

## 2022-11-10 ENCOUNTER — Ambulatory Visit: Payer: Medicaid Other

## 2022-11-10 ENCOUNTER — Encounter (HOSPITAL_COMMUNITY): Payer: Self-pay

## 2022-11-13 ENCOUNTER — Encounter: Payer: Self-pay | Admitting: Internal Medicine

## 2022-11-13 ENCOUNTER — Ambulatory Visit (HOSPITAL_COMMUNITY)
Admission: RE | Admit: 2022-11-13 | Discharge: 2022-11-13 | Disposition: A | Discharge status: Home / Self Care | Payer: Medicaid Other | Source: Ambulatory Visit | Attending: Internal Medicine | Admitting: Internal Medicine

## 2022-11-13 ENCOUNTER — Encounter (HOSPITAL_COMMUNITY): Payer: Self-pay

## 2022-11-13 ENCOUNTER — Telehealth: Payer: Self-pay

## 2022-11-13 DIAGNOSIS — C3490 Malignant neoplasm of unspecified part of unspecified bronchus or lung: Secondary | ICD-10-CM | POA: Insufficient documentation

## 2022-11-13 MED ORDER — FLUDEOXYGLUCOSE F - 18 (FDG) INJECTION: 9.5000 | Freq: Once | INTRAVENOUS | Status: DC | PRN

## 2022-11-13 MED FILL — Fosaprepitant Dimeglumine For IV Infusion 150 MG (Base Eq): INTRAVENOUS | Qty: 5 | Status: AC

## 2022-11-13 MED FILL — Dexamethasone Sodium Phosphate Inj 100 MG/10ML: INTRAMUSCULAR | Qty: 1 | Status: AC

## 2022-11-13 NOTE — Progress Notes (Signed)
8:15am-Pt's sister, Vaughan Basta, called the navigator to let her know the pt was unable to lie flat. Navigator thanked [place] for the update and assured her that the information will be passed on to Dr.Mohamed.

## 2022-11-13 NOTE — Telephone Encounter (Signed)
Pt c/o increased pain medications adjusted per nikki as follows: - OxyContin increase it to every 8 hours, so add a dose in the middle of the day - for your percocet increase to 1-2 pills every 4 hours as needed for pain.  - Tomorrow for  CT simulation 30 mins BEFORE take 2 robaxin pills. If it has been 2 hours since your last percocet you may also take ONE additional percocet 30 mins BEFORE your treatment.  Attempted to call pt with this information, no answer, LVM as well as [employer] sent.

## 2022-11-13 NOTE — Progress Notes (Signed)
Called pt to introduce myself as his Arboriculturist and to discuss the [employer].  I left a msg requesting he return my call if he's interested in applying for the grant.

## 2022-11-13 NOTE — Progress Notes (Unsigned)
[place]  [phone] [phone]) [phone]   Name: Maxwell Grant Date: 11/13/2022 MRN: [id number]  DOB: 1966-04-10  Patient Care Team: Bernerd Limbo, MD as PCP - General (Family Medicine)    INTERVAL HISTORY: Maxwell Grant is a 57 y.o. male with  oncologic medical history including new diagnosed non-small cell lung cancer (10/2022), as well as a history of COPD, diabetes mellitus, hypertension, dyslipidemia as well as history of osteomyelitis of the back.  Palliative ask to see for symptom and pain management and goals of care.     SOCIAL HISTORY:     reports that he has been smoking cigarettes. He has been smoking an average of 2 packs per day. He has never used smokeless tobacco. He reports current alcohol use of about 1.0 standard drink of alcohol per week. He reports current drug use. Drug: Marijuana.  ADVANCE DIRECTIVES:    CODE STATUS:   PAST MEDICAL HISTORY: Past Medical History:  Diagnosis Date   COPD (chronic obstructive pulmonary disease) ([place])    Diabetes mellitus without complication ([place])    Hypertension     ALLERGIES:  is allergic to statins, aspirin, flexeril [cyclobenzaprine], and penicillins.  MEDICATIONS:  Current Outpatient Medications  Medication Sig Dispense Refill   Accu-Chek FastClix Lancets MISC Use to test blood sugar 4 times a day (Patient not taking: Reported on 11/02/2022) 102 each 0   albuterol (VENTOLIN HFA) 108 (90 Base) MCG/ACT inhaler Inhale 1-2 puffs into the lungs every 4 (four) hours as needed for wheezing or shortness of breath.     amLODipine (NORVASC) 10 MG tablet Take 10 mg by mouth in the morning. (Patient not taking: Reported on 11/02/2022)     blood glucose meter kit and supplies KIT Use to test blood sugar 4 times a day (Patient not taking: Reported on [id number]) 1 each 0   folic acid (FOLVITE) 1 MG tablet Take 1 tablet (1 mg total) by mouth daily. 30 tablet 4   furosemide (LASIX)  20 MG tablet Take 1 tablet (20 mg total) by mouth once as needed for up to 1 dose. As long as your systolic blood pressure is >120 7 tablet 0   glimepiride (AMARYL) 2 MG tablet Take 2 mg by mouth in the morning.     lidocaine-prilocaine (EMLA) cream APPLY TOPICALLY AS NEEDED. APPLY TO PORT PRIOR TO ACCESS AS NEEDED 1 kit 2   lisinopril (ZESTRIL) 30 MG tablet Take 30 mg by mouth in the morning.     LORazepam (ATIVAN) 0.5 MG tablet Take 1 tablet (0.5 mg total) by mouth daily as needed for anxiety. Take one tablet 30 min prior to PET scan 4 tablet 0   methocarbamol (ROBAXIN) 500 MG tablet Take 1 tablet (500 mg total) by mouth every 12 (twelve) hours as needed for muscle spasms. 10 tablet 0   metoprolol tartrate (LOPRESSOR) 25 MG tablet Take 25 mg by mouth 2 (two) times daily.     oxyCODONE-acetaminophen (PERCOCET) 7.5-325 MG tablet Take 1 tablet by mouth every 6 (six) hours as needed for severe pain. 60 tablet 0   prochlorperazine (COMPAZINE) 10 MG tablet Take 1 tablet (10 mg total) by mouth every 6 (six) hours as needed for nausea or vomiting. 30 tablet 0   umeclidinium-vilanterol (ANORO ELLIPTA) 62.5-25 MCG/ACT AEPB Inhale 1 puff into the lungs daily. 60 each 11   No current facility-administered medications for this visit.   Facility-Administered Medications Ordered in Other Visits  Medication Dose  Route Frequency Provider Last Rate Last Admin   fludeoxyglucose F - 18 (FDG) injection 9.5 millicurie  9.5 millicurie Intravenous Once PRN Corrie Mckusick, DO        VITAL SIGNS: There were no vitals taken for this visit. There were no vitals filed for this visit.  Estimated body mass index is 30.72 kg/m as calculated from the following:   Height as of 10/30/22: '5\' 6"'$  (1.676 m).   Weight as of 11/02/22: 190 lb 4.8 oz (86.3 kg).   PERFORMANCE STATUS (ECOG) : 2 - Symptomatic, <50% confined to bed   Physical Exam General: NAD Cardiovascular: regular rate and rhythm Pulmonary: clear ant  fields Abdomen: soft, nontender, + bowel sounds GU: no suprapubic tenderness Extremities: no edema, no joint deformities Skin: no rashes Neurological:   IMPRESSION: ***  Neoplasm related pain Maxwell Grant complains of constant lower back, bilateral hip pain that radiates down his left leg.  He describes pain as sharp, stabbing, throbbing, burning, nagging, and constant.  Reports no relief despite nonpharmacological interventions such as heating pads, ice packs, topical pain rubs, and pain patches.  He describes his pain as 8-9 out of 10.  Pain does decrease to 5-6 out of 10 with medications.   We reviewed his current pain regimen at length.  Patient recently started on OxyContin 20 mg every 12 hours by his PCP.  Oxycodone 7.5/325 1 tablet every 8 hours as needed for breakthrough pain.  Patient reports minimal to no improvement with current regimen.  He is taking his oxycodone every 8 hours but reports pain intensifies 4-5 hours.  Education provided on long-acting and short acting pain medication.  Given he has only been taking his OxyContin for less than 48 hours encourage patient to continue taking as prescribed and we will plan to continue to monitor and make adjustments as needed.  Education provided to patient on ability to increase oxycodone to every 6 hours as needed for breakthrough pain.     Patient is concerned about his inability to tolerate Port-A-Cath placement and PET scan on next week due to inability to lie flat related to his pain.  We discussed use of medications with hopes of being able to complete required testing.  Education provided on the importance of scheduled procedures.  Education provided on the use of muscle relaxer for additional support as well as Ativan 30-45 minutes prior to his PET scan.  We discussed Port-A-Cath placement procedure and advised patient that the medical team would have options to allow for his comfort during the procedure.  Patient and sister verbalized  understanding and appreciation.  He knows to take a dose of Ativan over the weekend to ensure his tolerance as well as Robaxin.  Education provided on timing of medications in addition to his oxycodone/OxyContin.   Extensive education provided on goals of pain management with palliative care here at the cancer center.  Medication, physical, and psychosocial assessment completed.  Patient denies any recent use of alcohol.  His last drink was 3 months prior and at that time he would drink 2-3 shots of bourbon or vodka.  Occasional marijuana use.  Denies other illicit drug use.  Education provided on pain contract.  Patient verbalized understanding and contract signed willingly.   We will continue to closely monitor and adjust medications as needed.   2.  Constipation    3.  Decreased appetite/weight loss    4.  Lower extremity edema  I discussed the importance of continued conversation with family and  their medical providers regarding overall plan of care and treatment options, ensuring decisions are within the context of the patients values and GOCs.   PLAN: Established therapeutic relationship. Education provided on palliative's role in collaboration with their Oncology/Radiation team. OxyContin 20 mg every 12 hours Oxycodone 7.5/325 every 6 hours as needed for breakthrough pain. Robaxin 500 mg 1 tablet every 12 hours as needed for muscle spasms.  Patient to take 1 tablet the night prior to PET scan. Ativan 30-45 minutes prior to PET scan. MiraLAX twice daily Colace daily Education on nutrition. Ongoing symptom management support and goals of care discussions. I will plan to see patient back in 2-4 weeks in collaboration to other oncology appointments.  We will plan to follow-up with patient next week either in person or by phone for close symptom management follow-up.  Patient expressed understanding and was in agreement with this plan. He also understands that He can call the clinic at  any time with any questions, concerns, or complaints.   Any controlled substances utilized were prescribed in the context of palliative care. PDMP has been reviewed.   Time Total: ***  Visit consisted of counseling and education dealing with the complex and emotionally intense issues of symptom management and palliative care in the setting of serious and potentially life-threatening illness.Greater than 50%  of this time was spent counseling and coordinating care related to the above assessment and plan.  Alda Lea, AGPCNP-BC  Palliative Medicine Team/[facility]

## 2022-11-13 NOTE — Progress Notes (Signed)
Radiation Oncology         [phone]) [phone] ________________________________  Name: Maxwell Grant        MRN: [id number]  Date of Service: 11/14/2022 DOB: 1966/03/02  [id number], [name], MD  Collene Gobble, MD     REFERRING PHYSICIAN: Collene Gobble, MD   DIAGNOSIS: The primary encounter diagnosis was Malignant neoplasm of right upper lobe of lung ([place]). A diagnosis of Malignant neoplasm of unspecified part of unspecified bronchus or lung ([place]) was also pertinent to this visit.   HISTORY OF PRESENT ILLNESS: Maxwell Grant is a 57 y.o. male seen at the request of Dr. Lamonte Sakai and Dr. Julien Nordmann for a diagnosis of Stage IV lung cancer.  The patient has a history of osteomyelitis of his back and complained of back pain by his PCP and presented for CT angiography at [facility] in January 2024 that showed 5.6 cm right upper lobe mass, bulky right hilar adenopathy and subcarinal adenopathy.  He was referred to pulmonary and on 10/30/2022 underwent bronchoscopy with endobronchial ultrasound, his cytology was consistent with adenocarcinoma, and an MRI of the lumbar spine was performed on 10/30/2022 showing enhancing metastatic disease involving the L5 vertebral body, T11, extensive disease in the posterior elements and L2, and MRI of the pelvis also showed extensive disease of the left iliac bone and adjacent pelvic muscles.  In addition the right iliac L5 and left femur as well as left pelvic sidewall adenopathy were noted.  He went for pet imaging on 11/13/2022 but could not comfortably lay flat to proceed.  He is seen today to discuss palliative radiation and is to begin palliative chemoimmunotherapy tomorrow.  Of note a CT [facility] head [facility] on 10/17/2022 did not show any acute intracranial abnormalities.    PREVIOUS RADIATION THERAPY: No   PAST MEDICAL HISTORY:  Past Medical History:  Diagnosis Date   COPD (chronic obstructive pulmonary disease) ([place])    Diabetes mellitus without  complication ([place])    Hypertension        PAST SURGICAL HISTORY: Past Surgical History:  Procedure Laterality Date   BRONCHIAL NEEDLE ASPIRATION BIOPSY  10/30/2022   Procedure: BRONCHIAL NEEDLE ASPIRATION BIOPSIES;  Surgeon: Collene Gobble, MD;  Location: [facility];  Service: Cardiopulmonary;;   NO PAST SURGERIES     RADIOLOGY WITH ANESTHESIA  10/30/2022   Procedure: MRI WITH ANESTHESIA W/WO CONTRAST;  Surgeon: Collene Gobble, MD;  Location: [place];  Service: Cardiopulmonary;;   VIDEO BRONCHOSCOPY WITH ENDOBRONCHIAL ULTRASOUND Right 10/30/2022   Procedure: VIDEO BRONCHOSCOPY WITH ENDOBRONCHIAL ULTRASOUND;  Surgeon: Collene Gobble, MD;  Location: [facility];  Service: Cardiopulmonary;  Laterality: Right;     FAMILY HISTORY: History reviewed. No pertinent family history.   SOCIAL HISTORY:  reports that he has been smoking cigarettes. He has been smoking an average of 2 packs per day. He has never used smokeless tobacco. He reports current alcohol use of about 1.0 standard drink of alcohol per week. He reports current drug use. Drug: Marijuana.   ALLERGIES: Statins, Aspirin, Flexeril [cyclobenzaprine], and Penicillins   MEDICATIONS:  Current Outpatient Medications  Medication Sig Dispense Refill   albuterol (VENTOLIN HFA) 108 (90 Base) MCG/ACT inhaler Inhale 1-2 puffs into the lungs every 4 (four) hours as needed for wheezing or shortness of breath.     folic acid (FOLVITE) 1 MG tablet Take 1 tablet (1 mg total) by mouth daily. 30 tablet 4   furosemide (LASIX) 20 MG tablet Take 1 tablet (20  mg total) by mouth once as needed for up to 1 dose. As long as your systolic blood pressure is >120 7 tablet 0   glimepiride (AMARYL) 2 MG tablet Take 2 mg by mouth in the morning.     lidocaine-prilocaine (EMLA) cream APPLY TOPICALLY AS NEEDED. APPLY TO PORT PRIOR TO ACCESS AS NEEDED 1 kit 2   oxyCODONE (OXYCONTIN) 20 mg 12 hr tablet Take 20 mg by mouth every 12 (twelve) hours.      oxyCODONE-acetaminophen (PERCOCET) 7.5-325 MG tablet Take 1 tablet by mouth every 6 (six) hours as needed for severe pain. 60 tablet 0   umeclidinium-vilanterol (ANORO ELLIPTA) 62.5-25 MCG/ACT AEPB Inhale 1 puff into the lungs daily. 60 each 11   Accu-Chek FastClix Lancets MISC Use to test blood sugar 4 times a day (Patient not taking: Reported on 11/02/2022) 102 each 0   amLODipine (NORVASC) 10 MG tablet Take 10 mg by mouth in the morning. (Patient not taking: Reported on 11/02/2022)     blood glucose meter kit and supplies KIT Use to test blood sugar 4 times a day (Patient not taking: Reported on 11/02/2022) 1 each 0   LORazepam (ATIVAN) 0.5 MG tablet Take 1 tablet (0.5 mg total) by mouth daily as needed for anxiety. Take one tablet 30 min prior to PET scan (Patient not taking: Reported on 11/14/2022) 4 tablet 0   methocarbamol (ROBAXIN) 500 MG tablet Take 1 tablet (500 mg total) by mouth every 12 (twelve) hours as needed for muscle spasms. (Patient not taking: Reported on 11/14/2022) 10 tablet 0   prochlorperazine (COMPAZINE) 10 MG tablet Take 1 tablet (10 mg total) by mouth every 6 (six) hours as needed for nausea or vomiting. (Patient not taking: Reported on 11/14/2022) 30 tablet 0   No current facility-administered medications for this encounter.   Facility-Administered Medications Ordered in Other Encounters  Medication Dose Route Frequency Provider Last Rate Last Admin   fludeoxyglucose F - 18 (FDG) injection 9.5 millicurie  9.5 millicurie Intravenous Once PRN Corrie Mckusick, DO         REVIEW OF SYSTEMS: On review of systems, the patient reports that he is really struggling with pain management. He is working with palliative medicine to have better control with long and short acting narcotics, ativan, and muscle relaxer. He describes pain in his low back that radiates into his left hip area and is causing weakness with walking. He denies any loss of sensation or control of his feet, ankles, knees,  or hips. No falls are noted. He sleeps sitting up in a chair, and has started noticing lower extremity edema bilaterally. No complaints of shortness of breath, chest pain, nausea, or headaches are noted.      PHYSICAL EXAM:  Wt Readings from Last 3 Encounters:  11/14/22 191 lb 8 oz (86.9 kg)  11/02/22 190 lb 4.8 oz (86.3 kg)  10/30/22 192 lb (87.1 kg)   Temp Readings from Last 3 Encounters:  11/14/22 97.6 F (36.4 C) (Temporal)  11/09/22 98.2 F (36.8 C) (Temporal)  11/03/22 98.1 F (36.7 C) (Oral)   BP Readings from Last 3 Encounters:  11/14/22 128/80  11/09/22 131/78  11/03/22 108/66   Pulse Readings from Last 3 Encounters:  11/14/22 (!) 107  11/09/22 100  11/03/22 95   Pain Assessment Pain Score: 10-Worst pain ever Pain Loc: Back (Lower back and left hip.)/10  In general this is a tired, chronically ill appearing caucasian male in no acute distress. He's alert and oriented  x4 and appropriate throughout the examination. Cardiopulmonary assessment is negative for acute distress and he exhibits normal effort.     ECOG = 3  0 - Asymptomatic (Fully active, able to carry on all predisease activities without restriction)  1 - Symptomatic but completely ambulatory (Restricted in physically strenuous activity but ambulatory and able to carry out work of a light or sedentary nature. For example, light housework, office work)  2 - Symptomatic, <50% in bed during the day (Ambulatory and capable of all self care but unable to carry out any work activities. Up and about more than 50% of waking hours)  3 - Symptomatic, >50% in bed, but not bedbound (Capable of only limited self-care, confined to bed or chair 50% or more of waking hours)  4 - Bedbound (Completely disabled. Cannot carry on any self-care. Totally confined to bed or chair)  5 - Death   Eustace Pen MM, Creech RH, Tormey DC, et al. [phone]). "Toxicity and response criteria of the [facility]". [place] Oncol. 5 (6): 649-55    LABORATORY DATA:  Lab Results  Component Value Date   WBC 14.1 (H) 11/02/2022   HGB 14.3 11/02/2022   HCT 42.2 11/02/2022   MCV 87.4 11/02/2022   PLT 105 (L) 11/02/2022   Lab Results  Component Value Date   NA 139 11/02/2022   K 4.1 11/02/2022   CL 100 11/02/2022   CO2 32 11/02/2022   Lab Results  Component Value Date   ALT 20 11/02/2022   AST 11 (L) 11/02/2022   ALKPHOS 84 11/02/2022   BILITOT 0.6 11/02/2022      RADIOGRAPHY: MR Lumbar Spine W Wo Contrast  Result Date: 10/30/2022 CLINICAL DATA:  Back pain.  History of right lung mass EXAM: MRI LUMBAR SPINE WITHOUT AND WITH CONTRAST TECHNIQUE: Multiplanar and multiecho pulse sequences of the lumbar spine were obtained without and with intravenous contrast. CONTRAST:  24m GADAVIST GADOBUTROL 1 MMOL/ML IV SOLN COMPARISON:  Pelvic MRI, same date. FINDINGS: Segmentation: There are five lumbar type vertebral bodies. The last full intervertebral disc space is labeled L5-S1. Alignment: Normal alignment of the lumbar vertebral bodies. Straightening of the normal lumbar lordosis. Vertebrae: Enhancing metastatic bone disease extensively involving the pelvis. There is a large lesion involving the L5 vertebral body with suspected enhancing epidural tumor but no significant canal stenosis. There is also a left-sided T11 bone lesion and extensive tumor involving and destroying the posterior elements on the left at L3 and extending out into the adjacent musculature. This involves the posterior aspect of the pedicle. The left facet joint and the transverse process. No canal stenosis. Conus medullaris and cauda equina: Conus extends to the T12-L1 level. Conus and cauda equina appear normal. Paraspinal and other soft tissues: Small but likely metastatic adenopathy involving the retroperitoneum. The largest node is inter aortocaval and measures 9 mm. Disc levels: L1-2: Bulging degenerated annulus and facet disease with mild  bilateral lateral recess stenosis but no significant spinal or foraminal stenosis. L2-3: Diffuse bulging degenerated annulus, moderate facet disease and ligamentum flavum thickening contributing to moderately severe spinal and bilateral lateral recess stenosis. No foraminal stenosis. L3-4 the disc space is fused.  No spinal or foraminal stenosis. L4-5: Bulging annulus and facet disease contributing to moderate spinal and bilateral lateral recess stenosis without significant foraminal stenosis. L5-S1: Lesion in the L5 vertebral body with suspected epidural tumor but no significant spinal stenosis. There is also extensive tumor involving the left upper sacrum with  extensive tumor in the left paraspinal muscles. IMPRESSION: 1. Enhancing metastatic bone disease extensively involving the pelvis. 2. Large lesion involving the L5 vertebral body with suspected enhancing epidural tumor but no significant spinal stenosis. 3. Left-sided T11 bone lesion. 4. Extensive metastatic disease involving the posterior elements of L2 on the left as detailed above. No canal stenosis. 5. Small but likely metastatic adenopathy involving the retroperitoneum. 6. Moderately severe spinal and bilateral lateral recess stenosis at L2-3. 7. Moderate spinal and bilateral lateral recess stenosis at L4-5. Electronically Signed   By: Marijo Sanes M.D.   On: 10/30/2022 11:06   MR PELVIS W WO CONTRAST  Result Date: 10/30/2022 CLINICAL DATA:  Left-sided pelvic pain.  History of right lung mass. EXAM: MRI PELVIS WITHOUT AND WITH CONTRAST TECHNIQUE: Multiplanar multisequence MR imaging of the pelvis was performed both before and after administration of intravenous contrast. CONTRAST:  58m GADAVIST GADOBUTROL 1 MMOL/ML IV SOLN COMPARISON:  None Available. FINDINGS: Urinary Tract: The bladder is unremarkable. No bladder mass or calculi. Bowel: The rectum, sigmoid colon and visualized small-bowel loops are grossly. Vascular/Lymphatic: The major vascular  structures are patent. No aneurysm. No lower retroperitoneal adenopathy. Enlarged left external iliac node measures 10 mm on image 15/17. 7.5 mm pelvic sidewall node on image 18/17. No inguinal adenopathy. Reproductive: The prostate gland and seminal vesicles are unremarkable. Other:  No intrapelvic mass or free pelvic fluid collections. Musculoskeletal: Large destructive bone lesion involving the left iliac bone extending out into the adjacent iliacus and gluteus muscles. This is a large aggressive necrotic appearing tumor with very heterogeneous contrast enhancement extensively involving the muscles of the left pelvis. There is also a large lesion involving the left sacrum which is partially destroyed. There is also a smaller lesion involving the right iliac bone with cortical breakthrough and involvement of the right iliacus muscle. There is also a lesion involving the L5 vertebral body but no canal stenosis. There does appear to be enhancing epidural tumor on the sagittal postcontrast sequence. Small lesion involving the head neck junction region of the left hip. IMPRESSION: 1. Extensive osseous metastatic disease most notably involving the left iliac bone with extensive tumor involvement of the adjacent pelvic muscles. 2. Other lesions involving the left sacrum, the right iliac bone, the L5 vertebral body and the left femur. 3. Left-sided pelvic adenopathy. Electronically Signed   By: PMarijo SanesM.D.   On: 10/30/2022 10:58       IMPRESSION/PLAN: 1. 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 Dr. MLisbeth Renshawdiscusses the pathology findings and reviews the nature of metastatic lung cancer involving the bones. Dr. MLisbeth Renshawrecommends rescheduling PET as well as an MRI of the brain when the patient can more comfortably tolerate this, and discusses the rationale for palliative radiation for his painful bony disease. We discussed the risks, benefits, short, and long term effects of  radiotherapy, as well as the palliative intent, and the patient is interested in proceeding. Dr. MLisbeth Renshawdiscusses the delivery and logistics of radiotherapy and anticipates a course of 3 weeks of radiotherapy to the Lumbosacral spine into the left iliac region of the pelvis. Written consent is obtained and placed in the chart, a copy was provided to the patient. He will simulate today.  2. Painful bone metastases. We appreciate palliative medicine's input and care for this patient. It is quite clear he is very uncomfortable and we will consider IM narcotic injection today to tolerate simulation if needed.  In a visit  lasting 60 minutes, greater than 50% of the time was spent face to face discussing the patient's condition, in preparation for the discussion, and coordinating the patient's care.   The above documentation reflects my direct findings during this shared patient visit. Please see the separate note by Dr. Lisbeth Renshaw on this date for the remainder of the patient's plan of care.    Carola Rhine, [facility]   **Disclaimer: This note was dictated with voice recognition software. Similar sounding words can inadvertently be transcribed and this note may contain transcription errors which may not have been corrected upon publication of note.**

## 2022-11-14 ENCOUNTER — Encounter: Payer: Self-pay | Admitting: Internal Medicine

## 2022-11-14 ENCOUNTER — Inpatient Hospital Stay: Payer: Medicaid Other

## 2022-11-14 ENCOUNTER — Inpatient Hospital Stay (HOSPITAL_BASED_OUTPATIENT_CLINIC_OR_DEPARTMENT_OTHER): Payer: Medicaid Other | Admitting: Nurse Practitioner

## 2022-11-14 ENCOUNTER — Encounter: Payer: Self-pay | Admitting: Nurse Practitioner

## 2022-11-14 ENCOUNTER — Inpatient Hospital Stay (HOSPITAL_BASED_OUTPATIENT_CLINIC_OR_DEPARTMENT_OTHER): Payer: Medicaid Other | Admitting: Internal Medicine

## 2022-11-14 ENCOUNTER — Ambulatory Visit
Admission: RE | Admit: 2022-11-14 | Discharge: 2022-11-14 | Disposition: A | Discharge status: Home / Self Care | Payer: Medicaid Other | Source: Ambulatory Visit | Attending: Radiation Oncology | Admitting: Radiation Oncology

## 2022-11-14 ENCOUNTER — Telehealth: Payer: Self-pay | Admitting: Emergency Medicine

## 2022-11-14 ENCOUNTER — Encounter: Payer: Self-pay | Admitting: Radiation Oncology

## 2022-11-14 ENCOUNTER — Ambulatory Visit: Payer: Medicaid Other

## 2022-11-14 ENCOUNTER — Encounter: Payer: Self-pay | Admitting: Medical Oncology

## 2022-11-14 ENCOUNTER — Other Ambulatory Visit: Payer: Self-pay

## 2022-11-14 VITALS — BP 128/80 | HR 107 | Temp 97.6°F | Resp 18 | Ht 66.0 in | Wt 191.5 lb

## 2022-11-14 VITALS — BP 121/70 | HR 101 | Resp 20

## 2022-11-14 DIAGNOSIS — C3411 Malignant neoplasm of upper lobe, right bronchus or lung: Secondary | ICD-10-CM

## 2022-11-14 DIAGNOSIS — Z515 Encounter for palliative care: Secondary | ICD-10-CM

## 2022-11-14 DIAGNOSIS — Z5112 Encounter for antineoplastic immunotherapy: Secondary | ICD-10-CM | POA: Insufficient documentation

## 2022-11-14 DIAGNOSIS — Z79899 Other long term (current) drug therapy: Secondary | ICD-10-CM | POA: Insufficient documentation

## 2022-11-14 DIAGNOSIS — C3490 Malignant neoplasm of unspecified part of unspecified bronchus or lung: Secondary | ICD-10-CM

## 2022-11-14 DIAGNOSIS — Z79630 Long term (current) use of alkylating agent: Secondary | ICD-10-CM | POA: Insufficient documentation

## 2022-11-14 DIAGNOSIS — J449 Chronic obstructive pulmonary disease, unspecified: Secondary | ICD-10-CM | POA: Diagnosis not present

## 2022-11-14 DIAGNOSIS — R590 Localized enlarged lymph nodes: Secondary | ICD-10-CM | POA: Insufficient documentation

## 2022-11-14 DIAGNOSIS — K5900 Constipation, unspecified: Secondary | ICD-10-CM

## 2022-11-14 DIAGNOSIS — Z5111 Encounter for antineoplastic chemotherapy: Secondary | ICD-10-CM | POA: Diagnosis present

## 2022-11-14 DIAGNOSIS — R0609 Other forms of dyspnea: Secondary | ICD-10-CM | POA: Diagnosis not present

## 2022-11-14 DIAGNOSIS — C7951 Secondary malignant neoplasm of bone: Secondary | ICD-10-CM

## 2022-11-14 DIAGNOSIS — R531 Weakness: Secondary | ICD-10-CM

## 2022-11-14 DIAGNOSIS — Z79631 Long term (current) use of antimetabolite agent: Secondary | ICD-10-CM | POA: Diagnosis not present

## 2022-11-14 DIAGNOSIS — G893 Neoplasm related pain (acute) (chronic): Secondary | ICD-10-CM

## 2022-11-14 DIAGNOSIS — Z79620 Long term (current) use of immunosuppressive biologic: Secondary | ICD-10-CM | POA: Insufficient documentation

## 2022-11-14 DIAGNOSIS — F17210 Nicotine dependence, cigarettes, uncomplicated: Secondary | ICD-10-CM | POA: Insufficient documentation

## 2022-11-14 DIAGNOSIS — R630 Anorexia: Secondary | ICD-10-CM | POA: Diagnosis not present

## 2022-11-14 DIAGNOSIS — I10 Essential (primary) hypertension: Secondary | ICD-10-CM | POA: Insufficient documentation

## 2022-11-14 DIAGNOSIS — E119 Type 2 diabetes mellitus without complications: Secondary | ICD-10-CM | POA: Insufficient documentation

## 2022-11-14 LAB — CBC WITH DIFFERENTIAL (CANCER CENTER ONLY)
Abs Immature Granulocytes: 0.07 10*3/uL (ref 0.00–0.07)
Basophils Absolute: 0.1 10*3/uL (ref 0.0–0.1)
Basophils Relative: 1 %
Eosinophils Absolute: 1 10*3/uL — ABNORMAL HIGH (ref 0.0–0.5)
Eosinophils Relative: 9 %
HCT: 38.9 % — ABNORMAL LOW (ref 39.0–52.0)
Hemoglobin: 12.9 g/dL — ABNORMAL LOW (ref 13.0–17.0)
Immature Granulocytes: 1 %
Lymphocytes Relative: 12 %
Lymphs Abs: 1.4 10*3/uL (ref 0.7–4.0)
MCH: 28.6 pg (ref 26.0–34.0)
MCHC: 33.2 g/dL (ref 30.0–36.0)
MCV: 86.3 fL (ref 80.0–100.0)
Monocytes Absolute: 1.4 10*3/uL — ABNORMAL HIGH (ref 0.1–1.0)
Monocytes Relative: 12 %
Neutro Abs: 7.9 10*3/uL — ABNORMAL HIGH (ref 1.7–7.7)
Neutrophils Relative %: 65 %
Platelet Count: 363 10*3/uL (ref 150–400)
RBC: 4.51 MIL/uL (ref 4.22–5.81)
RDW: 15 % (ref 11.5–15.5)
WBC Count: 11.9 10*3/uL — ABNORMAL HIGH (ref 4.0–10.5)
nRBC: 0 % (ref 0.0–0.2)

## 2022-11-14 LAB — CMP (CANCER CENTER ONLY)
ALT: 20 U/L (ref 0–44)
AST: 14 U/L — ABNORMAL LOW (ref 15–41)
Albumin: 3.6 g/dL (ref 3.5–5.0)
Alkaline Phosphatase: 90 U/L (ref 38–126)
Anion gap: 7 (ref 5–15)
BUN: 9 mg/dL (ref 6–20)
CO2: 30 mmol/L (ref 22–32)
Calcium: 9 mg/dL (ref 8.9–10.3)
Chloride: 100 mmol/L (ref 98–111)
Creatinine: 0.69 mg/dL (ref 0.61–1.24)
GFR, Estimated: >60 mL/min (ref >60)
Glucose, Bld: 62 mg/dL — ABNORMAL LOW (ref 70–99)
Potassium: 4.3 mmol/L (ref 3.5–5.1)
Sodium: 137 mmol/L (ref 135–145)
Total Bilirubin: 0.5 mg/dL (ref 0.3–1.2)
Total Protein: 6.9 g/dL (ref 6.5–8.1)

## 2022-11-14 LAB — TSH: TSH: 1.579 u[IU]/mL (ref 0.350–4.500)

## 2022-11-14 MED ORDER — SODIUM CHLORIDE 0.9 % IV SOLN: Freq: Once | INTRAVENOUS | Status: AC

## 2022-11-14 MED ORDER — SODIUM CHLORIDE 0.9 % IV SOLN
150.0000 mg | Freq: Once | INTRAVENOUS | Status: AC
  Administered 2022-11-14: 150 mg via INTRAVENOUS
  Filled 2022-11-14: qty 150

## 2022-11-14 MED ORDER — SODIUM CHLORIDE 0.9 % IV SOLN
200.0000 mg | Freq: Once | INTRAVENOUS | Status: AC
  Administered 2022-11-14: 200 mg via INTRAVENOUS
  Filled 2022-11-14: qty 8

## 2022-11-14 MED ORDER — SODIUM CHLORIDE 0.9 % IV SOLN
683.5000 mg | Freq: Once | INTRAVENOUS | Status: AC
  Administered 2022-11-14: 700 mg via INTRAVENOUS
  Filled 2022-11-14: qty 70

## 2022-11-14 MED ORDER — SODIUM CHLORIDE 0.9 % IV SOLN
10.0000 mg | Freq: Once | INTRAVENOUS | Status: AC
  Administered 2022-11-14: 10 mg via INTRAVENOUS
  Filled 2022-11-14: qty 10

## 2022-11-14 MED ORDER — PALONOSETRON HCL INJECTION 0.25 MG/5ML
0.2500 mg | Freq: Once | INTRAVENOUS | Status: AC
  Administered 2022-11-14: 0.25 mg via INTRAVENOUS
  Filled 2022-11-14: qty 5

## 2022-11-14 MED ORDER — HYDROMORPHONE HCL 1 MG/ML IJ SOLN
0.5000 mg | Freq: Once | INTRAMUSCULAR | Status: AC
  Administered 2022-11-14: 0.5 mg via INTRAMUSCULAR
  Filled 2022-11-14: qty 1

## 2022-11-14 MED ORDER — SODIUM CHLORIDE 0.9 % IV SOLN
500.0000 mg/m2 | Freq: Once | INTRAVENOUS | Status: AC
  Administered 2022-11-14: 1000 mg via INTRAVENOUS
  Filled 2022-11-14: qty 40

## 2022-11-14 NOTE — Progress Notes (Signed)
Pt provided a letter of support and was approved for the $1000 [employer].

## 2022-11-14 NOTE — Progress Notes (Signed)
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    Biopsies of Chest Lymph Nodes 10/30/2022     Past/Anticipated interventions by cardiothoracic surgery, if any:    Past/Anticipated interventions by medical oncology, if any:  Dr. Julien Nordmann 11/02/2022 -He was given the option of palliative care and hospice referral versus consideration of palliative systemic chemotherapy with carboplatin for AUC of 5, Alimta 500 Mg/M2 and Keytruda 200 Mg IV every 3 weeks if the molecular studies are negative for actionable mutations.  -The patient is interested in the treatment but we will not start the first cycle of his treatment until the availability of the molecular studies at least by the blood test.  -First infusion 11/14/2022   Tobacco/Marijuana/Snuff/ETOH use: Current Smoker   If Spine Met(s), symptoms, if any, include: Bowel/Bladder retention or incontinence (please describe): No Numbness or weakness in extremities (please describe): He reports weakness to his legs.   Ambulatory status? Walker? Wheelchair?: Wheelchair   Signs/Symptoms Weight changes, if any: No Respiratory complaints, if any: He reports increased SOB when his pain increases. Hemoptysis, if any: He reports productive cough with clear/yellowish phlegm.  Denies hemoptysis.  Pain issues, if any:  Left Hip, lower back- constant pain. Taking medication.   SAFETY ISSUES: Prior radiation? No Pacemaker/ICD?  No Possible current pregnancy? N/a Is the patient on methotrexate? No  Current Complaints / other details:

## 2022-11-14 NOTE — Patient Instructions (Addendum)
[facility]  Discharge Instructions: Thank you for choosing [facility] to provide your oncology and hematology care.   If you have a lab appointment with the [facility], please go directly to the [facility] and check in at the registration area.   Wear comfortable clothing and clothing appropriate for easy access to any Portacath or PICC line.   We strive to give you quality time with your provider. You may need to reschedule your appointment if you arrive late (15 or more minutes).  Arriving late affects you and other patients whose appointments are after yours.  Also, if you miss three or more appointments without notifying the office, you may be dismissed from the clinic at the provider's discretion.      For prescription refill requests, have your pharmacy contact our office and allow 72 hours for refills to be completed.    Today you received the following chemotherapy and/or immunotherapy agents Keytruda, Alimta, Carboplatin      To help prevent nausea and vomiting after your treatment, we encourage you to take your nausea medication as directed.  BELOW ARE SYMPTOMS THAT SHOULD BE REPORTED IMMEDIATELY: *FEVER GREATER THAN 100.4 F (38 C) OR HIGHER *CHILLS OR SWEATING *NAUSEA AND VOMITING THAT IS NOT CONTROLLED WITH YOUR NAUSEA MEDICATION *UNUSUAL SHORTNESS OF BREATH *UNUSUAL BRUISING OR BLEEDING *URINARY PROBLEMS (pain or burning when urinating, or frequent urination) *BOWEL PROBLEMS (unusual diarrhea, constipation, pain near the anus) TENDERNESS IN MOUTH AND THROAT WITH OR WITHOUT PRESENCE OF ULCERS (sore throat, sores in mouth, or a toothache) UNUSUAL RASH, SWELLING OR PAIN  UNUSUAL VAGINAL DISCHARGE OR ITCHING   Items with * indicate a potential emergency and should be followed up as soon as possible or go to the Emergency Department if any problems should occur.  Please show the CHEMOTHERAPY ALERT CARD or  IMMUNOTHERAPY ALERT CARD at check-in to the Emergency Department and triage nurse.  Should you have questions after your visit or need to cancel or reschedule your appointment, please contact [facility]  Dept: [phone]  and follow the prompts.  Office hours are 8:00 a.m. to 4:30 p.m. Monday - Friday. Please note that voicemails left after 4:00 p.m. may not be returned until the following business day.  We are closed weekends and major holidays. You have access to a nurse at all times for urgent questions. Please call the main number to the clinic Dept: [phone] and follow the prompts.   For any non-urgent questions, you may also contact your provider using MyChart. We now offer e-Visits for anyone 52 and older to request care online for non-urgent symptoms. For details visit [email or web address].   Also download the MyChart app! Go to the app store, search "MyChart", open the app, select [facility], and log in with your MyChart username and password.  Pembrolizumab Injection What is this medication? PEMBROLIZUMAB (PEM broe LIZ ue mab) treats some types of cancer. It works by helping your immune system slow or stop the spread of cancer cells. It is a monoclonal antibody. This medicine may be used for other purposes; ask your health care provider or pharmacist if you have questions. COMMON BRAND NAME(S): Keytruda What should I tell my care team before I take this medication? They need to know if you have any of these conditions: Allogeneic stem cell transplant (uses someone else's stem cells) Autoimmune diseases, such as Crohn disease, ulcerative colitis, lupus History of chest  radiation Nervous system problems, such as Guillain-Barre syndrome, myasthenia gravis Organ transplant An unusual or allergic reaction to pembrolizumab, other medications, foods, dyes, or preservatives Pregnant or trying to get pregnant Breast-feeding How should I use  this medication? This medication is injected into a vein. It is given by your care team in a hospital or clinic setting. A special MedGuide will be given to you before each treatment. Be sure to read this information carefully each time. Talk to your care team about the use of this medication in children. While it may be prescribed for children as young as 6 months for selected conditions, precautions do apply. Overdosage: If you think you have taken too much of this medicine contact a poison control center or emergency room at once. NOTE: This medicine is only for you. Do not share this medicine with others. What if I miss a dose? Keep appointments for follow-up doses. It is important not to miss your dose. Call your care team if you are unable to keep an appointment. What may interact with this medication? Interactions have not been studied. This list may not describe all possible interactions. Give your health care provider a list of all the medicines, herbs, non-prescription drugs, or dietary supplements you use. Also tell them if you smoke, drink alcohol, or use illegal drugs. Some items may interact with your medicine. What should I watch for while using this medication? Your condition will be monitored carefully while you are receiving this medication. You may need blood work while taking this medication. This medication may cause serious skin reactions. They can happen weeks to months after starting the medication. Contact your care team right away if you notice fevers or flu-like symptoms with a rash. The rash may be red or purple and then turn into blisters or peeling of the skin. You may also notice a red rash with swelling of the face, lips, or lymph nodes in your neck or under your arms. Tell your care team right away if you have any change in your eyesight. Talk to your care team if you may be pregnant. Serious birth defects can occur if you take this medication during pregnancy and for  4 months after the last dose. You will need a negative pregnancy test before starting this medication. Contraception is recommended while taking this medication and for 4 months after the last dose. Your care team can help you find the option that works for you. Do not breastfeed while taking this medication and for 4 months after the last dose. What side effects may I notice from receiving this medication? Side effects that you should report to your care team as soon as possible: Allergic reactions--skin rash, itching, hives, swelling of the face, lips, tongue, or throat Dry cough, shortness of breath or trouble breathing Eye pain, redness, irritation, or discharge with blurry or decreased vision Heart muscle inflammation--unusual weakness or fatigue, shortness of breath, chest pain, fast or irregular heartbeat, dizziness, swelling of the ankles, feet, or hands Hormone gland problems--headache, sensitivity to light, unusual weakness or fatigue, dizziness, fast or irregular heartbeat, increased sensitivity to cold or heat, excessive sweating, constipation, hair loss, increased thirst or amount of urine, tremors or shaking, irritability Infusion reactions--chest pain, shortness of breath or trouble breathing, feeling faint or lightheaded Kidney injury (glomerulonephritis)--decrease in the amount of urine, red or dark brown urine, foamy or bubbly urine, swelling of the ankles, hands, or feet Liver injury--right upper belly pain, loss of appetite, nausea, light-colored stool, dark  yellow or brown urine, yellowing skin or eyes, unusual weakness or fatigue Pain, tingling, or numbness in the hands or feet, muscle weakness, change in vision, confusion or trouble speaking, loss of balance or coordination, trouble walking, seizures Rash, fever, and swollen lymph nodes Redness, blistering, peeling, or loosening of the skin, including inside the mouth Sudden or severe stomach pain, bloody diarrhea, fever,  nausea, vomiting Side effects that usually do not require medical attention (report to your care team if they continue or are bothersome): Bone, joint, or muscle pain Diarrhea Fatigue Loss of appetite Nausea Skin rash This list may not describe all possible side effects. Call your doctor for medical advice about side effects. You may report side effects to FDA at 1-800-FDA-1088. Where should I keep my medication? This medication is given in a hospital or clinic. It will not be stored at home. NOTE: This sheet is a summary. It may not cover all possible information. If you have questions about this medicine, talk to your doctor, pharmacist, or health care provider.  2023 Elsevier/Gold Standard (2022-01-17 00:00:00)  Pemetrexed Injection What is this medication? PEMETREXED (PEM e TREX ed) treats some types of cancer. It works by slowing down the growth of cancer cells. This medicine may be used for other purposes; ask your health care provider or pharmacist if you have questions. COMMON BRAND NAME(S): Alimta, PEMFEXY What should I tell my care team before I take this medication? They need to know if you have any of these conditions: Infection, such as chickenpox, cold sores, or herpes Kidney disease Low blood cell levels (white cells, red cells, and platelets) Lung or breathing disease, such as asthma Radiation therapy An unusual or allergic reaction to pemetrexed, other medications, foods, dyes, or preservatives If you or your partner are pregnant or trying to get pregnant Breast-feeding How should I use this medication? This medication is injected into a vein. It is given by your care team in a hospital or clinic setting. Talk to your care team about the use of this medication in children. Special care may be needed. Overdosage: If you think you have taken too much of this medicine contact a poison control center or emergency room at once. NOTE: This medicine is only for you. Do not  share this medicine with others. What if I miss a dose? Keep appointments for follow-up doses. It is important not to miss your dose. Call your care team if you are unable to keep an appointment. What may interact with this medication? Do not take this medication with any of the following: Live virus vaccines This medication may also interact with the following: Ibuprofen This list may not describe all possible interactions. Give your health care provider a list of all the medicines, herbs, non-prescription drugs, or dietary supplements you use. Also tell them if you smoke, drink alcohol, or use illegal drugs. Some items may interact with your medicine. What should I watch for while using this medication? Your condition will be monitored carefully while you are receiving this medication. This medication may make you feel generally unwell. This is not uncommon as chemotherapy can affect healthy cells as well as cancer cells. Report any side effects. Continue your course of treatment even though you feel ill unless your care team tells you to stop. This medication can cause serious side effects. To reduce the risk, your care team may give you other medications to take before receiving this one. Be sure to follow the directions from your care team.  This medication can cause a rash or redness in areas of the body that have previously had radiation therapy. If you have had radiation therapy, tell your care team if you notice a rash in this area. This medication may increase your risk of getting an infection. Call your care team for advice if you get a fever, chills, sore throat, or other symptoms of a cold or flu. Do not treat yourself. Try to avoid being around people who are sick. Be careful brushing or flossing your teeth or using a toothpick because you may get an infection or bleed more easily. If you have any dental work done, tell your dentist you are receiving this medication. Avoid taking  medications that contain aspirin, acetaminophen, ibuprofen, naproxen, or ketoprofen unless instructed by your care team. These medications may hide a fever. Check with your care team if you have severe diarrhea, nausea, and vomiting, or if you sweat a lot. The loss of too much body fluid may make it dangerous for you to take this medication. Talk to your care team if you or your partner wish to become pregnant or think either of you might be pregnant. This medication can cause serious birth defects if taken during pregnancy and for 6 months after the last dose. A negative pregnancy test is required before starting this medication. A reliable form of contraception is recommended while taking this medication and for 6 months after the last dose. Talk to your care team about reliable forms of contraception. Do not father a child while taking this medication and for 3 months after the last dose. Use a condom while having sex during this time period. Do not breastfeed while taking this medication and for 1 week after the last dose. This medication may cause infertility. Talk to your care team if you are concerned about your fertility. What side effects may I notice from receiving this medication? Side effects that you should report to your care team as soon as possible: Allergic reactions--skin rash, itching, hives, swelling of the face, lips, tongue, or throat Dry cough, shortness of breath or trouble breathing Infection--fever, chills, cough, sore throat, wounds that don't heal, pain or trouble when passing urine, general feeling of discomfort or being unwell Kidney injury--decrease in the amount of urine, swelling of the ankles, hands, or feet Low red blood cell level--unusual weakness or fatigue, dizziness, headache, trouble breathing Redness, blistering, peeling, or loosening of the skin, including inside the mouth Unusual bruising or bleeding Side effects that usually do not require medical attention  (report to your care team if they continue or are bothersome): Fatigue Loss of appetite Nausea Vomiting This list may not describe all possible side effects. Call your doctor for medical advice about side effects. You may report side effects to FDA at 1-800-FDA-1088. Where should I keep my medication? This medication is given in a hospital or clinic. It will not be stored at home. NOTE: This sheet is a summary. It may not cover all possible information. If you have questions about this medicine, talk to your doctor, pharmacist, or health care provider.  2023 Elsevier/Gold Standard (2022-01-09 00:00:00)  Carboplatin Injection What is this medication? CARBOPLATIN (KAR boe pla tin) treats some types of cancer. It works by slowing down the growth of cancer cells. This medicine may be used for other purposes; ask your health care provider or pharmacist if you have questions. COMMON BRAND NAME(S): Paraplatin What should I tell my care team before I take this medication? They  need to know if you have any of these conditions: Blood disorders Hearing problems Kidney disease Recent or ongoing radiation therapy An unusual or allergic reaction to carboplatin, cisplatin, other medications, foods, dyes, or preservatives Pregnant or trying to get pregnant Breast-feeding How should I use this medication? This medication is injected into a vein. It is given by your care team in a hospital or clinic setting. Talk to your care team about the use of this medication in children. Special care may be needed. Overdosage: If you think you have taken too much of this medicine contact a poison control center or emergency room at once. NOTE: This medicine is only for you. Do not share this medicine with others. What if I miss a dose? Keep appointments for follow-up doses. It is important not to miss your dose. Call your care team if you are unable to keep an appointment. What may interact with this  medication? Medications for seizures Some antibiotics, such as amikacin, gentamicin, neomycin, streptomycin, tobramycin Vaccines This list may not describe all possible interactions. Give your health care provider a list of all the medicines, herbs, non-prescription drugs, or dietary supplements you use. Also tell them if you smoke, drink alcohol, or use illegal drugs. Some items may interact with your medicine. What should I watch for while using this medication? Your condition will be monitored carefully while you are receiving this medication. You may need blood work while taking this medication. This medication may make you feel generally unwell. This is not uncommon, as chemotherapy can affect healthy cells as well as cancer cells. Report any side effects. Continue your course of treatment even though you feel ill unless your care team tells you to stop. In some cases, you may be given additional medications to help with side effects. Follow all directions for their use. This medication may increase your risk of getting an infection. Call your care team for advice if you get a fever, chills, sore throat, or other symptoms of a cold or flu. Do not treat yourself. Try to avoid being around people who are sick. Avoid taking medications that contain aspirin, acetaminophen, ibuprofen, naproxen, or ketoprofen unless instructed by your care team. These medications may hide a fever. Be careful brushing or flossing your teeth or using a toothpick because you may get an infection or bleed more easily. If you have any dental work done, tell your dentist you are receiving this medication. Talk to your care team if you wish to become pregnant or think you might be pregnant. This medication can cause serious birth defects. Talk to your care team about effective forms of contraception. Do not breast-feed while taking this medication. What side effects may I notice from receiving this medication? Side effects  that you should report to your care team as soon as possible: Allergic reactions--skin rash, itching, hives, swelling of the face, lips, tongue, or throat Infection--fever, chills, cough, sore throat, wounds that don't heal, pain or trouble when passing urine, general feeling of discomfort or being unwell Low red blood cell level--unusual weakness or fatigue, dizziness, headache, trouble breathing Pain, tingling, or numbness in the hands or feet, muscle weakness, change in vision, confusion or trouble speaking, loss of balance or coordination, trouble walking, seizures Unusual bruising or bleeding Side effects that usually do not require medical attention (report to your care team if they continue or are bothersome): Hair loss Nausea Unusual weakness or fatigue Vomiting This list may not describe all possible side effects. Call your  doctor for medical advice about side effects. You may report side effects to FDA at 1-800-FDA-1088. Where should I keep my medication? This medication is given in a hospital or clinic. It will not be stored at home. NOTE: This sheet is a summary. It may not cover all possible information. If you have questions about this medicine, talk to your doctor, pharmacist, or health care provider.  2023 Elsevier/Gold Standard (2007-10-26 00:00:00)

## 2022-11-14 NOTE — Progress Notes (Signed)
   Vitals are not all within treatment parameters. Per Dr Julien Nordmann , it is ok to treat pt today with  Carboplatin ,Alimta and Keytruda with pulse of 105-107.  Labs reviewed: and are within treatment parameters.  Per physician team, patient is ready for treatment and there are NO modifications to the treatment plan.

## 2022-11-14 NOTE — Telephone Encounter (Signed)
I spoke with Dr. Kristopher Oppenheim regarding the Guardant360 results.  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  He has seen Dr. Julien Nordmann, is already preparing treatment strategy.  It does not sound like he will be a candidate for the clinical trial

## 2022-11-14 NOTE — Telephone Encounter (Signed)
Dr.'s office in [place] calling to speak to Dr. Lamonte Sakai.  Discuss a clinical trial based on Guardunt test results.  Dr. Kristopher Oppenheim [phone]

## 2022-11-14 NOTE — Patient Instructions (Addendum)
-   continue to take 2 percocet pills every 4 hours for breakthrough pain - continue to take the long acting oxycontin every 8 hours - we will touch base thursday to see how you are doing with these changes - we will send in an order for a hospital bed, they should contact you within the next day or so

## 2022-11-14 NOTE — Telephone Encounter (Signed)
Routing to Dr. Lamonte Sakai.

## 2022-11-14 NOTE — Progress Notes (Signed)
[facility] [phone]) [phone]   [phone]) [phone]  OFFICE PROGRESS NOTE  Bernerd Limbo, MD [address]  [place] [address]  DIAGNOSIS: Stage IV (T3, N2, M1 C) non-small cell lung cancer, adenocarcinoma presented with large right upper lobe lung mass in addition to right hilar and subcarinal lymphadenopathy in addition to multiple metastatic bone lesions involving the pelvis as well as the lower thoracic and lumbar spines in addition to metastatic disease to the pelvic muscles diagnosed in February 2024.   Detected Alteration(s) / Biomarker(s) Associated FDA-approved therapies Clinical Trial Availability% cfDNA or Amplification  KRAS G12C approved by FDA Adagrasib, Sotorasib Yes 29.3%  CDK4 Amplification None Yes High (+++)Plasma Copy Number: 8.0  PD-L1 expression 98%  PRIOR THERAPY: Palliative radiotherapy to the painful bone metastasis under the care of Dr. Lisbeth Renshaw  CURRENT THERAPY: Systemic chemotherapy with carboplatin for AUC of 5, Alimta 500 Mg/M2 and Keytruda 200 Mg IV every 3 weeks.  First dose November 14, 2022  INTERVAL HISTORY: Maxwell Grant 57 y.o. male returns to the clinic today for follow-up visit accompanied by his sister.  The patient is feeling fine except for the baseline back pain and also radiation to the head.  He was seen by Dr. Lisbeth Renshaw earlier today and expected to start palliative radiotherapy later today.  He was also managed by the palliative care team for his pain issues.  He denied having any chest pain, shortness of breath, cough or hemoptysis.  He has no nausea, vomiting, diarrhea or constipation.  He has no headache or visual changes.  His molecular studies showed positive KRAS G12C mutation and his PD-L1 expression is 98%.  The patient is here today for evaluation before starting the first cycle of his systemic chemotherapy.  MEDICAL HISTORY: Past Medical History:  Diagnosis Date   COPD (chronic obstructive  pulmonary disease) ([place])    Diabetes mellitus without complication ([place])    Hypertension     ALLERGIES:  is allergic to statins, aspirin, flexeril [cyclobenzaprine], and penicillins.  MEDICATIONS:  Current Outpatient Medications  Medication Sig Dispense Refill   Accu-Chek FastClix Lancets MISC Use to test blood sugar 4 times a day (Patient not taking: Reported on 11/02/2022) 102 each 0   albuterol (VENTOLIN HFA) 108 (90 Base) MCG/ACT inhaler Inhale 1-2 puffs into the lungs every 4 (four) hours as needed for wheezing or shortness of breath.     amLODipine (NORVASC) 10 MG tablet Take 10 mg by mouth in the morning. (Patient not taking: Reported on 11/02/2022)     blood glucose meter kit and supplies KIT Use to test blood sugar 4 times a day (Patient not taking: Reported on [id number]) 1 each 0   folic acid (FOLVITE) 1 MG tablet Take 1 tablet (1 mg total) by mouth daily. 30 tablet 4   furosemide (LASIX) 20 MG tablet Take 1 tablet (20 mg total) by mouth once as needed for up to 1 dose. As long as your systolic blood pressure is >120 7 tablet 0   glimepiride (AMARYL) 2 MG tablet Take 2 mg by mouth in the morning.     lidocaine-prilocaine (EMLA) cream APPLY TOPICALLY AS NEEDED. APPLY TO PORT PRIOR TO ACCESS AS NEEDED 1 kit 2   LORazepam (ATIVAN) 0.5 MG tablet Take 1 tablet (0.5 mg total) by mouth daily as needed for anxiety. Take one tablet 30 min prior to PET scan (Patient not taking: Reported on 11/14/2022) 4 tablet 0   methocarbamol (ROBAXIN)  500 MG tablet Take 1 tablet (500 mg total) by mouth every 12 (twelve) hours as needed for muscle spasms. (Patient not taking: Reported on 11/14/2022) 10 tablet 0   oxyCODONE (OXYCONTIN) 20 mg 12 hr tablet Take 20 mg by mouth every 12 (twelve) hours.     oxyCODONE-acetaminophen (PERCOCET) 7.5-325 MG tablet Take 1 tablet by mouth every 6 (six) hours as needed for severe pain. 60 tablet 0   prochlorperazine (COMPAZINE) 10 MG tablet Take 1 tablet (10 mg total) by mouth  every 6 (six) hours as needed for nausea or vomiting. (Patient not taking: Reported on 11/14/2022) 30 tablet 0   umeclidinium-vilanterol (ANORO ELLIPTA) 62.5-25 MCG/ACT AEPB Inhale 1 puff into the lungs daily. 60 each 11   No current facility-administered medications for this visit.   Facility-Administered Medications Ordered in Other Visits  Medication Dose Route Frequency Provider Last Rate Last Admin   fludeoxyglucose F - 18 (FDG) injection 9.5 millicurie  9.5 millicurie Intravenous Once PRN Corrie Mckusick, DO        SURGICAL HISTORY:  Past Surgical History:  Procedure Laterality Date   BRONCHIAL NEEDLE ASPIRATION BIOPSY  10/30/2022   Procedure: BRONCHIAL NEEDLE ASPIRATION BIOPSIES;  Surgeon: Collene Gobble, MD;  Location: [facility];  Service: Cardiopulmonary;;   NO PAST SURGERIES     RADIOLOGY WITH ANESTHESIA  10/30/2022   Procedure: MRI WITH ANESTHESIA W/WO CONTRAST;  Surgeon: Collene Gobble, MD;  Location: [facility] ENDOSCOPY;  Service: Cardiopulmonary;;   VIDEO BRONCHOSCOPY WITH ENDOBRONCHIAL ULTRASOUND Right 10/30/2022   Procedure: VIDEO BRONCHOSCOPY WITH ENDOBRONCHIAL ULTRASOUND;  Surgeon: Collene Gobble, MD;  Location: [facility];  Service: Cardiopulmonary;  Laterality: Right;    REVIEW OF SYSTEMS:  Constitutional: positive for fatigue Eyes: negative Ears, nose, mouth, throat, and face: negative Respiratory: positive for dyspnea on exertion Cardiovascular: negative Gastrointestinal: negative Genitourinary:negative Integument/breast: negative Hematologic/lymphatic: negative Musculoskeletal:positive for back pain and bone pain Neurological: negative Behavioral/Psych: negative Endocrine: negative Allergic/Immunologic: negative   PHYSICAL EXAMINATION: General appearance: alert, cooperative, fatigued, and no distress Head: Normocephalic, without obvious abnormality, atraumatic Neck: no adenopathy, no JVD, supple, symmetrical, trachea midline, and thyroid not enlarged,  symmetric, no tenderness/mass/nodules Lymph nodes: Cervical, supraclavicular, and axillary nodes normal. Resp: clear to auscultation bilaterally Back: symmetric, no curvature. ROM normal. No CVA tenderness. Cardio: regular rate and rhythm, S1, S2 normal, no murmur, click, rub or gallop GI: soft, non-tender; bowel sounds normal; no masses,  no organomegaly Extremities: extremities normal, atraumatic, no cyanosis or edema Neurologic: Alert and oriented X 3, normal strength and tone. Normal symmetric reflexes. Normal coordination and gait  ECOG PERFORMANCE STATUS: 1 - Symptomatic but completely ambulatory  There were no vitals taken for this visit.  LABORATORY DATA: Lab Results  Component Value Date   WBC 14.1 (H) 11/02/2022   HGB 14.3 11/02/2022   HCT 42.2 11/02/2022   MCV 87.4 11/02/2022   PLT 105 (L) 11/02/2022      Chemistry      Component Value Date/Time   NA 139 11/02/2022 1320   K 4.1 11/02/2022 1320   CL 100 11/02/2022 1320   CO2 32 11/02/2022 1320   BUN 17 11/02/2022 1320   CREATININE 0.88 11/02/2022 1320      Component Value Date/Time   CALCIUM 12.1 (H) 11/02/2022 1320   ALKPHOS 84 11/02/2022 1320   AST 11 (L) 11/02/2022 1320   ALT 20 11/02/2022 1320   BILITOT 0.6 11/02/2022 1320       RADIOGRAPHIC STUDIES: MR Lumbar Spine W Wo  Contrast  Result Date: 10/30/2022 CLINICAL DATA:  Back pain.  History of right lung mass EXAM: MRI LUMBAR SPINE WITHOUT AND WITH CONTRAST TECHNIQUE: Multiplanar and multiecho pulse sequences of the lumbar spine were obtained without and with intravenous contrast. CONTRAST:  40m GADAVIST GADOBUTROL 1 MMOL/ML IV SOLN COMPARISON:  Pelvic MRI, same date. FINDINGS: Segmentation: There are five lumbar type vertebral bodies. The last full intervertebral disc space is labeled L5-S1. Alignment: Normal alignment of the lumbar vertebral bodies. Straightening of the normal lumbar lordosis. Vertebrae: Enhancing metastatic bone disease extensively  involving the pelvis. There is a large lesion involving the L5 vertebral body with suspected enhancing epidural tumor but no significant canal stenosis. There is also a left-sided T11 bone lesion and extensive tumor involving and destroying the posterior elements on the left at L3 and extending out into the adjacent musculature. This involves the posterior aspect of the pedicle. The left facet joint and the transverse process. No canal stenosis. Conus medullaris and cauda equina: Conus extends to the T12-L1 level. Conus and cauda equina appear normal. Paraspinal and other soft tissues: Small but likely metastatic adenopathy involving the retroperitoneum. The largest node is inter aortocaval and measures 9 mm. Disc levels: L1-2: Bulging degenerated annulus and facet disease with mild bilateral lateral recess stenosis but no significant spinal or foraminal stenosis. L2-3: Diffuse bulging degenerated annulus, moderate facet disease and ligamentum flavum thickening contributing to moderately severe spinal and bilateral lateral recess stenosis. No foraminal stenosis. L3-4 the disc space is fused.  No spinal or foraminal stenosis. L4-5: Bulging annulus and facet disease contributing to moderate spinal and bilateral lateral recess stenosis without significant foraminal stenosis. L5-S1: Lesion in the L5 vertebral body with suspected epidural tumor but no significant spinal stenosis. There is also extensive tumor involving the left upper sacrum with extensive tumor in the left paraspinal muscles. IMPRESSION: 1. Enhancing metastatic bone disease extensively involving the pelvis. 2. Large lesion involving the L5 vertebral body with suspected enhancing epidural tumor but no significant spinal stenosis. 3. Left-sided T11 bone lesion. 4. Extensive metastatic disease involving the posterior elements of L2 on the left as detailed above. No canal stenosis. 5. Small but likely metastatic adenopathy involving the retroperitoneum. 6.  Moderately severe spinal and bilateral lateral recess stenosis at L2-3. 7. Moderate spinal and bilateral lateral recess stenosis at L4-5. Electronically Signed   By: PMarijo SanesM.D.   On: 10/30/2022 11:06   MR PELVIS W WO CONTRAST  Result Date: 10/30/2022 CLINICAL DATA:  Left-sided pelvic pain.  History of right lung mass. EXAM: MRI PELVIS WITHOUT AND WITH CONTRAST TECHNIQUE: Multiplanar multisequence MR imaging of the pelvis was performed both before and after administration of intravenous contrast. CONTRAST:  845mGADAVIST GADOBUTROL 1 MMOL/ML IV SOLN COMPARISON:  None Available. FINDINGS: Urinary Tract: The bladder is unremarkable. No bladder mass or calculi. Bowel: The rectum, sigmoid colon and visualized small-bowel loops are grossly. Vascular/Lymphatic: The major vascular structures are patent. No aneurysm. No lower retroperitoneal adenopathy. Enlarged left external iliac node measures 10 mm on image 15/17. 7.5 mm pelvic sidewall node on image 18/17. No inguinal adenopathy. Reproductive: The prostate gland and seminal vesicles are unremarkable. Other:  No intrapelvic mass or free pelvic fluid collections. Musculoskeletal: Large destructive bone lesion involving the left iliac bone extending out into the adjacent iliacus and gluteus muscles. This is a large aggressive necrotic appearing tumor with very heterogeneous contrast enhancement extensively involving the muscles of the left pelvis. There is also a large lesion involving  the left sacrum which is partially destroyed. There is also a smaller lesion involving the right iliac bone with cortical breakthrough and involvement of the right iliacus muscle. There is also a lesion involving the L5 vertebral body but no canal stenosis. There does appear to be enhancing epidural tumor on the sagittal postcontrast sequence. Small lesion involving the head neck junction region of the left hip. IMPRESSION: 1. Extensive osseous metastatic disease most notably  involving the left iliac bone with extensive tumor involvement of the adjacent pelvic muscles. 2. Other lesions involving the left sacrum, the right iliac bone, the L5 vertebral body and the left femur. 3. Left-sided pelvic adenopathy. Electronically Signed   By: Marijo Sanes M.D.   On: 10/30/2022 10:58    ASSESSMENT AND PLAN: This is a very pleasant 57 years old white male with stage IV (T3, N2, M1 C) non-small cell lung cancer, adenocarcinoma presented with large right upper lobe lung mass in addition to right hilar and subcarinal lymphadenopathy in addition to multiple metastatic bone lesions involving the pelvis as well as the lower thoracic and lumbar spines in addition to metastatic disease to the pelvic muscles diagnosed in February 2024.  Molecular studies showed positive KRAS G12C mutation and PD-L1 expression of 98%. The patient is here today to start the first cycle of systemic chemotherapy with carboplatin for AUC of 5, Alimta 500 Mg/M2 and Keytruda 200 Mg IV every 3 weeks.  First dose November 14, 2022. He could not do the PET scan because he could not lay flat for the procedure. I recommended for the patient to proceed with the first cycle of his systemic chemotherapy today as planned. For the persistent back pain as well as hip pain, he is scheduled to start palliative radiotherapy under the care of Dr. Lisbeth Renshaw soon.  He is also managed by the palliative care team for his pain issues. I will see the patient back for follow-up visit in 3 weeks for evaluation before starting cycle #2. He was advised to call immediately if he has any concerning symptoms in the interval. The patient voices understanding of current disease status and treatment options and is in agreement with the current care plan.  All questions were answered. The patient knows to call the clinic with any problems, questions or concerns. We can certainly see the patient much sooner if necessary.  The total time spent in the  appointment was 30 minutes.  Disclaimer: This note was dictated with voice recognition software. Similar sounding words can inadvertently be transcribed and may not be corrected upon review.

## 2022-11-15 ENCOUNTER — Telehealth: Payer: Self-pay

## 2022-11-15 NOTE — Telephone Encounter (Signed)
LM for patient that this nurse was calling to see how they were doing after their treatment. Please call back to Dr. Mohamad's nurse at [phone] if they have any questions or concerns regarding the treatment. 

## 2022-11-15 NOTE — Telephone Encounter (Signed)
-----   Message from Rolene Course, RN sent at 11/14/2022  5:27 PM EST ----- Regarding: Maxwell Grant 1st Tx F/U call - Keytruda/Alimta/Carbo Mohamed 1st Tx F/U call - Keytruda/Alimta/Carbo.  Patient tolerated tx, was in a lot of pain, had some very jerky movements, very sleepy at times.  Evaluated by Anda Kraft PA & Lexine Baton NP while in infusion.  Determined this was not an infusion reaction.  Patient had been given dilaudid, robaxin & oxycontin prior to infusion.

## 2022-11-16 ENCOUNTER — Telehealth: Payer: Self-pay

## 2022-11-16 ENCOUNTER — Other Ambulatory Visit: Payer: Self-pay | Admitting: Student

## 2022-11-16 ENCOUNTER — Other Ambulatory Visit: Payer: Self-pay

## 2022-11-16 ENCOUNTER — Ambulatory Visit: Payer: Medicaid Other | Admitting: Radiation Oncology

## 2022-11-16 DIAGNOSIS — C3490 Malignant neoplasm of unspecified part of unspecified bronchus or lung: Secondary | ICD-10-CM

## 2022-11-16 LAB — T4: T4, Total: 7.2 ug/dL (ref 4.5–12.0)

## 2022-11-16 NOTE — Telephone Encounter (Signed)
Pt sister messaged and reported pt was lethargic, drooling, and had low sats at home, pt was placed on 2L [place] and sats returned to baseline. Pt rousable but quickly returns to sleep, pt family weaned off oxygen and pt is sating 94% on RA per pt family. Pt medications weaned down, OxyContin q12hrs,a dn percocet now 1 tab q8hr. Pt sister verbalized understanding of medication changes. Pt sister also educated on the s/s of drug overdose and when to call emergency services, to not give medications when pt is lethargic, to monitor pt, as well as to not pre-medicate prior to prot placement tomorrow. Pt sister verbalized understanding of all changes and education, no further questions or concerns at this time.

## 2022-11-17 ENCOUNTER — Ambulatory Visit (HOSPITAL_COMMUNITY)
Admission: RE | Admit: 2022-11-17 | Discharge: 2022-11-17 | Disposition: A | Discharge status: Home / Self Care | Payer: Medicaid Other | Source: Ambulatory Visit | Attending: Internal Medicine | Admitting: Internal Medicine

## 2022-11-17 ENCOUNTER — Ambulatory Visit: Payer: Medicaid Other

## 2022-11-17 ENCOUNTER — Encounter (HOSPITAL_COMMUNITY): Payer: Self-pay

## 2022-11-17 ENCOUNTER — Other Ambulatory Visit (HOSPITAL_COMMUNITY): Payer: Self-pay

## 2022-11-17 ENCOUNTER — Other Ambulatory Visit: Payer: Self-pay

## 2022-11-17 DIAGNOSIS — I878 Other specified disorders of veins: Secondary | ICD-10-CM

## 2022-11-17 DIAGNOSIS — C3490 Malignant neoplasm of unspecified part of unspecified bronchus or lung: Secondary | ICD-10-CM

## 2022-11-17 DIAGNOSIS — I10 Essential (primary) hypertension: Secondary | ICD-10-CM | POA: Diagnosis not present

## 2022-11-17 DIAGNOSIS — C7989 Secondary malignant neoplasm of other specified sites: Secondary | ICD-10-CM | POA: Diagnosis not present

## 2022-11-17 DIAGNOSIS — C775 Secondary and unspecified malignant neoplasm of intrapelvic lymph nodes: Secondary | ICD-10-CM | POA: Insufficient documentation

## 2022-11-17 DIAGNOSIS — Z7984 Long term (current) use of oral hypoglycemic drugs: Secondary | ICD-10-CM | POA: Insufficient documentation

## 2022-11-17 DIAGNOSIS — E119 Type 2 diabetes mellitus without complications: Secondary | ICD-10-CM | POA: Diagnosis not present

## 2022-11-17 DIAGNOSIS — C7951 Secondary malignant neoplasm of bone: Secondary | ICD-10-CM | POA: Insufficient documentation

## 2022-11-17 DIAGNOSIS — C3411 Malignant neoplasm of upper lobe, right bronchus or lung: Secondary | ICD-10-CM | POA: Insufficient documentation

## 2022-11-17 DIAGNOSIS — R918 Other nonspecific abnormal finding of lung field: Secondary | ICD-10-CM

## 2022-11-17 DIAGNOSIS — F17210 Nicotine dependence, cigarettes, uncomplicated: Secondary | ICD-10-CM | POA: Diagnosis not present

## 2022-11-17 DIAGNOSIS — J449 Chronic obstructive pulmonary disease, unspecified: Secondary | ICD-10-CM | POA: Diagnosis not present

## 2022-11-17 HISTORY — DX: Heart failure, unspecified: I50.9

## 2022-11-17 HISTORY — DX: Dyspnea, unspecified: R06.00

## 2022-11-17 HISTORY — PX: IR IMAGING GUIDED PORT INSERTION: IMG5740

## 2022-11-17 LAB — GLUCOSE, CAPILLARY: Glucose-Capillary: 101 mg/dL — ABNORMAL HIGH (ref 70–99)

## 2022-11-17 MED ORDER — LIDOCAINE-EPINEPHRINE 1 %-1:100000 IJ SOLN
INTRAMUSCULAR | Status: AC
  Administered 2022-11-17: 20 mL
  Filled 2022-11-17: qty 1

## 2022-11-17 MED ORDER — FENTANYL CITRATE (PF) 100 MCG/2ML IJ SOLN
INTRAMUSCULAR | Status: AC
  Filled 2022-11-17: qty 2

## 2022-11-17 MED ORDER — FENTANYL CITRATE (PF) 100 MCG/2ML IJ SOLN
INTRAMUSCULAR | Status: AC | PRN
  Administered 2022-11-17 (×2): 25 ug via INTRAVENOUS

## 2022-11-17 MED ORDER — SODIUM CHLORIDE 0.9 % IV SOLN: INTRAVENOUS | Status: DC

## 2022-11-17 MED ORDER — HEPARIN SOD (PORK) LOCK FLUSH 100 UNIT/ML IV SOLN
INTRAVENOUS | Status: AC
  Filled 2022-11-17: qty 5

## 2022-11-17 MED ORDER — MIDAZOLAM HCL 2 MG/2ML IJ SOLN
INTRAMUSCULAR | Status: AC | PRN
  Administered 2022-11-17 (×2): .5 mg via INTRAVENOUS

## 2022-11-17 MED ORDER — LIDOCAINE-PRILOCAINE 2.5-2.5 % EX CREA
TOPICAL_CREAM | CUTANEOUS | 2 refills | Status: DC | PRN
  Filled 2022-11-17: qty 30, 1d supply, fill #0

## 2022-11-17 MED ORDER — MIDAZOLAM HCL 2 MG/2ML IJ SOLN
INTRAMUSCULAR | Status: AC
  Filled 2022-11-17: qty 2

## 2022-11-17 NOTE — H&P (Signed)
Chief Complaint: Patient was seen in consultation today for stage IV non-small cell lung cancer at the request of [facility]  Referring Physician(s): Mohamed,Mohamed  Supervising Physician: Ruthann Cancer  Patient Status: [facility] - Out-pt  History of Present Illness: Maxwell Grant is a 57 y.o. male followed by oncology for stage IV non-small cell lung cancer. Pt was admitted to [facility] January 2024 for dyspnea and LE edema. Imaging at that time found new right upper lung mass and right hilar and subcarinal lymphadenopathy. He underwent bronchoscopy biopsy 10/30/22 that showed malignant cells consistent with non-small cell carcinoma. Pt complained of continuing back pain and underwent MR lumbar spine/pelvis that demonstrated extensive osseous metastatic disease most notable involving the left iliac bone with extensive tumor involvement of adjacent pelvic muscles, left sacrum, right iliac bone, L5 vertebral body, left femur and left-sided pelvic adenopathy. Pt has been referred by Dr. Julien Nordmann for tunneled catheter with port placement for chemotherapy.    Past Medical History:  Diagnosis Date   COPD (chronic obstructive pulmonary disease) ([place])    Diabetes mellitus without complication ([place])    Hypertension     Past Surgical History:  Procedure Laterality Date   BRONCHIAL NEEDLE ASPIRATION BIOPSY  10/30/2022   Procedure: BRONCHIAL NEEDLE ASPIRATION BIOPSIES;  Surgeon: Collene Gobble, MD;  Location: [place];  Service: Cardiopulmonary;;   NO PAST SURGERIES     RADIOLOGY WITH ANESTHESIA  10/30/2022   Procedure: MRI WITH ANESTHESIA W/WO CONTRAST;  Surgeon: Collene Gobble, MD;  Location: [facility] ENDOSCOPY;  Service: Cardiopulmonary;;   VIDEO BRONCHOSCOPY WITH ENDOBRONCHIAL ULTRASOUND Right 10/30/2022   Procedure: VIDEO BRONCHOSCOPY WITH ENDOBRONCHIAL ULTRASOUND;  Surgeon: Collene Gobble, MD;  Location: [place];  Service: Cardiopulmonary;  Laterality: Right;     Allergies: Statins, Aspirin, Flexeril [cyclobenzaprine], and Penicillins  Medications: Prior to Admission medications   Medication Sig Start Date End Date Taking? Authorizing Provider  Accu-Chek FastClix Lancets MISC Use to test blood sugar 4 times a day Patient not taking: Reported on 11/02/2022 03/18/21   Darliss Cheney, MD  albuterol (VENTOLIN HFA) 108 (90 Base) MCG/ACT inhaler Inhale 1-2 puffs into the lungs every 4 (four) hours as needed for wheezing or shortness of breath. 03/30/17   [provider]  amLODipine (NORVASC) 10 MG tablet Take 10 mg by mouth in the morning. Patient not taking: Reported on 11/02/2022 01/09/21   [provider]  blood glucose meter kit and supplies KIT Use to test blood sugar 4 times a day Patient not taking: Reported on 11/02/2022 03/18/21   Darliss Cheney, MD  folic acid (FOLVITE) 1 MG tablet Take 1 tablet (1 mg total) by mouth daily. 11/02/22   Curt Bears, MD  furosemide (LASIX) 20 MG tablet Take 1 tablet (20 mg total) by mouth once as needed for up to 1 dose. As long as your systolic blood pressure is >120 11/03/22   Heilingoetter, Cassandra L, PA-C  glimepiride (AMARYL) 2 MG tablet Take 2 mg by mouth in the morning. 02/26/21   [provider]  lidocaine-prilocaine (EMLA) cream Apply to [facility] Prior to Access as needed 11/17/22   Pickenpack-Cousar, Carlena Sax, NP  LORazepam (ATIVAN) 0.5 MG tablet Take 1 tablet (0.5 mg total) by mouth daily as needed for anxiety. Take one tablet 30 min prior to PET scan Patient not taking: Reported on 11/14/2022 11/09/22   Pickenpack-Cousar, Carlena Sax, NP  methocarbamol (ROBAXIN) 500 MG tablet Take 1 tablet (500 mg total) by mouth every 12 (twelve) hours as needed  for muscle spasms. Patient not taking: Reported on 11/14/2022 11/09/22   Pickenpack-Cousar, Carlena Sax, NP  oxyCODONE (OXYCONTIN) 20 mg 12 hr tablet Take 20 mg by mouth every 12 (twelve) hours. 11/07/22 12/07/22  [provider]   oxyCODONE-acetaminophen (PERCOCET) 7.5-325 MG tablet Take 1 tablet by mouth every 6 (six) hours as needed for severe pain. 11/09/22   Pickenpack-Cousar, Carlena Sax, NP  prochlorperazine (COMPAZINE) 10 MG tablet Take 1 tablet (10 mg total) by mouth every 6 (six) hours as needed for nausea or vomiting. Patient not taking: Reported on 11/14/2022 11/02/22   Curt Bears, MD  umeclidinium-vilanterol [facility] ELLIPTA) 62.5-25 MCG/ACT AEPB Inhale 1 puff into the lungs daily. 09/22/22   Chesley Mires, MD     No family history on file.  Social History   Socioeconomic History   Marital status: Soil scientist    Spouse name: Leda Gauze   Number of children: Not on file   Years of education: Not on file   Highest education level: Not on file  Occupational History   Not on file  Tobacco Use   Smoking status: Every Day    Packs/day: 2.00    Types: Cigarettes   Smokeless tobacco: Never   Tobacco comments:    Smokes 10-20 cigs a day. 06/13/2022. [place]  Vaping Use   Vaping Use: Never used  Substance and Sexual Activity   Alcohol use: Yes    Alcohol/week: 1.0 standard drink of alcohol    Types: 1 Standard drinks or equivalent per week   Drug use: Yes    Types: Marijuana   Sexual activity: Not on file  Other Topics Concern   Not on file  Social History Narrative   Not on file   Social Determinants of Health   Financial Resource Strain: Not on file  Food Insecurity: Food Insecurity Present (11/14/2022)   Hunger Vital Sign    Worried About Running Out of Food in the Last Year: Sometimes true    Ran Out of Food in the Last Year: Sometimes true  Transportation Needs: No Transportation Needs (11/14/2022)   PRAPARE - Hydrologist (Medical): No    Lack of Transportation (Non-Medical): No  Physical Activity: Not on file  Stress: Not on file  Social Connections: Not on file    Review of Systems: A 12 point ROS discussed and pertinent positives are indicated in the HPI  above.  All other systems are negative.  Review of Systems denies fever,HA,CP,cough, abd pain,N/V or bleeding; does have chronic dyspnea, back pain, leg pain  Vital Signs: Vitals:   11/17/22 1240  BP: 124/69  Pulse: (!) 115  Resp: 15  Temp: 97.9 F (36.6 C)  SpO2: 90%    Code Status: DNR   Physical Exam awake/alert; chest- distant BS bilat; heart- tachy but regular rhythm; abd- soft,+BS,NT  Imaging: MR Lumbar Spine W Wo Contrast  Result Date: 10/30/2022 CLINICAL DATA:  Back pain.  History of right lung mass EXAM: MRI LUMBAR SPINE WITHOUT AND WITH CONTRAST TECHNIQUE: Multiplanar and multiecho pulse sequences of the lumbar spine were obtained without and with intravenous contrast. CONTRAST:  59m GADAVIST GADOBUTROL 1 MMOL/ML IV SOLN COMPARISON:  Pelvic MRI, same date. FINDINGS: Segmentation: There are five lumbar type vertebral bodies. The last full intervertebral disc space is labeled L5-S1. Alignment: Normal alignment of the lumbar vertebral bodies. Straightening of the normal lumbar lordosis. Vertebrae: Enhancing metastatic bone disease extensively involving the pelvis. There is a large lesion involving the L5  vertebral body with suspected enhancing epidural tumor but no significant canal stenosis. There is also a left-sided T11 bone lesion and extensive tumor involving and destroying the posterior elements on the left at L3 and extending out into the adjacent musculature. This involves the posterior aspect of the pedicle. The left facet joint and the transverse process. No canal stenosis. Conus medullaris and cauda equina: Conus extends to the T12-L1 level. Conus and cauda equina appear normal. Paraspinal and other soft tissues: Small but likely metastatic adenopathy involving the retroperitoneum. The largest node is inter aortocaval and measures 9 mm. Disc levels: L1-2: Bulging degenerated annulus and facet disease with mild bilateral lateral recess stenosis but no significant spinal or  foraminal stenosis. L2-3: Diffuse bulging degenerated annulus, moderate facet disease and ligamentum flavum thickening contributing to moderately severe spinal and bilateral lateral recess stenosis. No foraminal stenosis. L3-4 the disc space is fused.  No spinal or foraminal stenosis. L4-5: Bulging annulus and facet disease contributing to moderate spinal and bilateral lateral recess stenosis without significant foraminal stenosis. L5-S1: Lesion in the L5 vertebral body with suspected epidural tumor but no significant spinal stenosis. There is also extensive tumor involving the left upper sacrum with extensive tumor in the left paraspinal muscles. IMPRESSION: 1. Enhancing metastatic bone disease extensively involving the pelvis. 2. Large lesion involving the L5 vertebral body with suspected enhancing epidural tumor but no significant spinal stenosis. 3. Left-sided T11 bone lesion. 4. Extensive metastatic disease involving the posterior elements of L2 on the left as detailed above. No canal stenosis. 5. Small but likely metastatic adenopathy involving the retroperitoneum. 6. Moderately severe spinal and bilateral lateral recess stenosis at L2-3. 7. Moderate spinal and bilateral lateral recess stenosis at L4-5. Electronically Signed   By: Marijo Sanes M.D.   On: 10/30/2022 11:06   MR PELVIS W WO CONTRAST  Result Date: 10/30/2022 CLINICAL DATA:  Left-sided pelvic pain.  History of right lung mass. EXAM: MRI PELVIS WITHOUT AND WITH CONTRAST TECHNIQUE: Multiplanar multisequence MR imaging of the pelvis was performed both before and after administration of intravenous contrast. CONTRAST:  44m GADAVIST GADOBUTROL 1 MMOL/ML IV SOLN COMPARISON:  None Available. FINDINGS: Urinary Tract: The bladder is unremarkable. No bladder mass or calculi. Bowel: The rectum, sigmoid colon and visualized small-bowel loops are grossly. Vascular/Lymphatic: The major vascular structures are patent. No aneurysm. No lower retroperitoneal  adenopathy. Enlarged left external iliac node measures 10 mm on image 15/17. 7.5 mm pelvic sidewall node on image 18/17. No inguinal adenopathy. Reproductive: The prostate gland and seminal vesicles are unremarkable. Other:  No intrapelvic mass or free pelvic fluid collections. Musculoskeletal: Large destructive bone lesion involving the left iliac bone extending out into the adjacent iliacus and gluteus muscles. This is a large aggressive necrotic appearing tumor with very heterogeneous contrast enhancement extensively involving the muscles of the left pelvis. There is also a large lesion involving the left sacrum which is partially destroyed. There is also a smaller lesion involving the right iliac bone with cortical breakthrough and involvement of the right iliacus muscle. There is also a lesion involving the L5 vertebral body but no canal stenosis. There does appear to be enhancing epidural tumor on the sagittal postcontrast sequence. Small lesion involving the head neck junction region of the left hip. IMPRESSION: 1. Extensive osseous metastatic disease most notably involving the left iliac bone with extensive tumor involvement of the adjacent pelvic muscles. 2. Other lesions involving the left sacrum, the right iliac bone, the L5 vertebral body and  the left femur. 3. Left-sided pelvic adenopathy. Electronically Signed   By: Marijo Sanes M.D.   On: 10/30/2022 10:58    Labs:  CBC: Recent Labs    11/02/22 1320 11/14/22 1118  WBC 14.1* 11.9*  HGB 14.3 12.9*  HCT 42.2 38.9*  PLT 105* 363    COAGS: No results for input(s): "INR", "APTT" in the last 8760 hours.  BMP: Recent Labs    11/02/22 1320 11/14/22 1118  NA 139 137  K 4.1 4.3  CL 100 100  CO2 32 30  GLUCOSE 125* 62*  BUN 17 9  CALCIUM 12.1* 9.0  CREATININE 0.88 0.69  GFRNONAA >60 >60    LIVER FUNCTION TESTS: Recent Labs    11/02/22 1320 11/14/22 1118  BILITOT 0.6 0.5  AST 11* 14*  ALT 20 20  ALKPHOS 84 90  PROT 6.6  6.9  ALBUMIN 3.6 3.6    TUMOR MARKERS: No results for input(s): "AFPTM", "CEA", "CA199", "CHROMGRNA" in the last 8760 hours.  Assessment and Plan:  57 yo male with PMHx significant for COPD, DM II, HTN and recently diagnosed stage IV non-small cell lung cancer presents to IR for tunneled catheter with port placement.   Risks and benefits of image guided tunneled catheter with port placement with moderate sedation was discussed with the patient including, but not limited to bleeding, infection, pneumothorax, or fibrin sheath development and need for additional procedures.  All of the patient's questions were answered, patient is agreeable to proceed. Consent signed and in chart.  Thank you for this interesting consult.  I greatly enjoyed meeting Maxwell Grant and look forward to participating in their care.  A copy of this report was sent to the requesting provider on this date.  Electronically Signed: Tyson Alias, NP 11/17/2022, 12:36 PM   I spent a total of 25 minutes    in face to face in clinical consultation, greater than 50% of which was counseling/coordinating care for port a cath placement/stage IV non-small cell lung cancer.

## 2022-11-17 NOTE — Procedures (Signed)
Interventional Radiology Procedure Note ° °Procedure: Single Lumen Power Port Placement   ° °Access:  Right internal jugular vein ° °Findings: Catheter tip positioned at cavoatrial junction. Port is ready for immediate use.  ° °Complications: None ° °EBL: < 10 mL ° °Recommendations:  °- Ok to shower in 24 hours °- Do not submerge for 7 days °- Routine line care  ° ° °Shammond Arave, MD ° ° ° °

## 2022-11-20 ENCOUNTER — Ambulatory Visit: Payer: Medicaid Other

## 2022-11-20 ENCOUNTER — Other Ambulatory Visit: Payer: Self-pay

## 2022-11-20 ENCOUNTER — Ambulatory Visit (HOSPITAL_BASED_OUTPATIENT_CLINIC_OR_DEPARTMENT_OTHER): Payer: Medicaid Other | Admitting: Pulmonary Disease

## 2022-11-20 ENCOUNTER — Ambulatory Visit: Payer: Medicaid Other | Admitting: Radiation Oncology

## 2022-11-20 ENCOUNTER — Encounter (HOSPITAL_BASED_OUTPATIENT_CLINIC_OR_DEPARTMENT_OTHER): Payer: Self-pay | Admitting: Pulmonary Disease

## 2022-11-20 DIAGNOSIS — C3490 Malignant neoplasm of unspecified part of unspecified bronchus or lung: Secondary | ICD-10-CM

## 2022-11-21 ENCOUNTER — Inpatient Hospital Stay: Payer: Medicaid Other | Attending: Internal Medicine

## 2022-11-21 ENCOUNTER — Ambulatory Visit: Payer: Medicaid Other

## 2022-11-21 ENCOUNTER — Inpatient Hospital Stay (HOSPITAL_BASED_OUTPATIENT_CLINIC_OR_DEPARTMENT_OTHER): Payer: Medicaid Other | Admitting: Nurse Practitioner

## 2022-11-21 ENCOUNTER — Encounter: Payer: Self-pay | Admitting: Nurse Practitioner

## 2022-11-21 ENCOUNTER — Other Ambulatory Visit (HOSPITAL_COMMUNITY): Payer: Self-pay

## 2022-11-21 VITALS — BP 127/77 | HR 103 | Temp 98.4°F | Resp 18

## 2022-11-21 DIAGNOSIS — R609 Edema, unspecified: Secondary | ICD-10-CM | POA: Insufficient documentation

## 2022-11-21 DIAGNOSIS — Z79899 Other long term (current) drug therapy: Secondary | ICD-10-CM | POA: Diagnosis not present

## 2022-11-21 DIAGNOSIS — Z888 Allergy status to other drugs, medicaments and biological substances status: Secondary | ICD-10-CM | POA: Insufficient documentation

## 2022-11-21 DIAGNOSIS — Z79631 Long term (current) use of antimetabolite agent: Secondary | ICD-10-CM | POA: Insufficient documentation

## 2022-11-21 DIAGNOSIS — Z5111 Encounter for antineoplastic chemotherapy: Secondary | ICD-10-CM | POA: Diagnosis present

## 2022-11-21 DIAGNOSIS — Z5112 Encounter for antineoplastic immunotherapy: Secondary | ICD-10-CM | POA: Diagnosis present

## 2022-11-21 DIAGNOSIS — K5903 Drug induced constipation: Secondary | ICD-10-CM | POA: Diagnosis not present

## 2022-11-21 DIAGNOSIS — C3411 Malignant neoplasm of upper lobe, right bronchus or lung: Secondary | ICD-10-CM | POA: Diagnosis present

## 2022-11-21 DIAGNOSIS — Z515 Encounter for palliative care: Secondary | ICD-10-CM | POA: Diagnosis not present

## 2022-11-21 DIAGNOSIS — Z79630 Long term (current) use of alkylating agent: Secondary | ICD-10-CM | POA: Insufficient documentation

## 2022-11-21 DIAGNOSIS — E1169 Type 2 diabetes mellitus with other specified complication: Secondary | ICD-10-CM | POA: Insufficient documentation

## 2022-11-21 DIAGNOSIS — M549 Dorsalgia, unspecified: Secondary | ICD-10-CM | POA: Insufficient documentation

## 2022-11-21 DIAGNOSIS — G893 Neoplasm related pain (acute) (chronic): Secondary | ICD-10-CM | POA: Diagnosis not present

## 2022-11-21 DIAGNOSIS — E785 Hyperlipidemia, unspecified: Secondary | ICD-10-CM | POA: Diagnosis not present

## 2022-11-21 DIAGNOSIS — K5900 Constipation, unspecified: Secondary | ICD-10-CM | POA: Insufficient documentation

## 2022-11-21 DIAGNOSIS — F17210 Nicotine dependence, cigarettes, uncomplicated: Secondary | ICD-10-CM | POA: Insufficient documentation

## 2022-11-21 DIAGNOSIS — R600 Localized edema: Secondary | ICD-10-CM | POA: Diagnosis not present

## 2022-11-21 DIAGNOSIS — Z886 Allergy status to analgesic agent status: Secondary | ICD-10-CM | POA: Diagnosis not present

## 2022-11-21 DIAGNOSIS — C3490 Malignant neoplasm of unspecified part of unspecified bronchus or lung: Secondary | ICD-10-CM | POA: Diagnosis not present

## 2022-11-21 DIAGNOSIS — J449 Chronic obstructive pulmonary disease, unspecified: Secondary | ICD-10-CM | POA: Diagnosis not present

## 2022-11-21 DIAGNOSIS — Z880 Allergy status to penicillin: Secondary | ICD-10-CM | POA: Diagnosis not present

## 2022-11-21 DIAGNOSIS — I509 Heart failure, unspecified: Secondary | ICD-10-CM | POA: Insufficient documentation

## 2022-11-21 DIAGNOSIS — Z79620 Long term (current) use of immunosuppressive biologic: Secondary | ICD-10-CM | POA: Diagnosis not present

## 2022-11-21 DIAGNOSIS — C7951 Secondary malignant neoplasm of bone: Secondary | ICD-10-CM | POA: Diagnosis not present

## 2022-11-21 DIAGNOSIS — F1290 Cannabis use, unspecified, uncomplicated: Secondary | ICD-10-CM | POA: Insufficient documentation

## 2022-11-21 DIAGNOSIS — I110 Hypertensive heart disease with heart failure: Secondary | ICD-10-CM | POA: Diagnosis not present

## 2022-11-21 DIAGNOSIS — R530 Neoplastic (malignant) related fatigue: Secondary | ICD-10-CM

## 2022-11-21 LAB — COMPREHENSIVE METABOLIC PANEL
ALT: 29 U/L (ref 0–44)
AST: 20 U/L (ref 15–41)
Albumin: 3.3 g/dL — ABNORMAL LOW (ref 3.5–5.0)
Alkaline Phosphatase: 73 U/L (ref 38–126)
Anion gap: 7 (ref 5–15)
BUN: 14 mg/dL (ref 6–20)
CO2: 31 mmol/L (ref 22–32)
Calcium: 8.7 mg/dL — ABNORMAL LOW (ref 8.9–10.3)
Chloride: 100 mmol/L (ref 98–111)
Creatinine, Ser: 0.92 mg/dL (ref 0.61–1.24)
GFR, Estimated: >60 mL/min (ref >60)
Glucose, Bld: 133 mg/dL — ABNORMAL HIGH (ref 70–99)
Potassium: 3.8 mmol/L (ref 3.5–5.1)
Sodium: 138 mmol/L (ref 135–145)
Total Bilirubin: 0.4 mg/dL (ref 0.3–1.2)
Total Protein: 6.4 g/dL — ABNORMAL LOW (ref 6.5–8.1)

## 2022-11-21 LAB — CBC WITH DIFFERENTIAL/PLATELET
Abs Immature Granulocytes: 0.03 10*3/uL (ref 0.00–0.07)
Basophils Absolute: 0 10*3/uL (ref 0.0–0.1)
Basophils Relative: 1 %
Eosinophils Absolute: 0.6 10*3/uL — ABNORMAL HIGH (ref 0.0–0.5)
Eosinophils Relative: 11 %
HCT: 33.9 % — ABNORMAL LOW (ref 39.0–52.0)
Hemoglobin: 11.1 g/dL — ABNORMAL LOW (ref 13.0–17.0)
Immature Granulocytes: 1 %
Lymphocytes Relative: 20 %
Lymphs Abs: 1.2 10*3/uL (ref 0.7–4.0)
MCH: 28.2 pg (ref 26.0–34.0)
MCHC: 32.7 g/dL (ref 30.0–36.0)
MCV: 86 fL (ref 80.0–100.0)
Monocytes Absolute: 0.3 10*3/uL (ref 0.1–1.0)
Monocytes Relative: 6 %
Neutro Abs: 3.7 10*3/uL (ref 1.7–7.7)
Neutrophils Relative %: 61 %
Platelets: 184 10*3/uL (ref 150–400)
RBC: 3.94 MIL/uL — ABNORMAL LOW (ref 4.22–5.81)
RDW: 15.1 % (ref 11.5–15.5)
WBC: 5.9 10*3/uL (ref 4.0–10.5)
nRBC: 0 % (ref 0.0–0.2)

## 2022-11-21 MED ORDER — OXYCODONE-ACETAMINOPHEN 7.5-325 MG PO TABS
1.0000 | ORAL_TABLET | Freq: Four times a day (QID) | ORAL | 0 refills | Status: DC | PRN
  Filled 2022-11-21: qty 60, 15d supply, fill #0
  Filled 2022-11-22: qty 3, 1d supply, fill #0
  Filled 2022-11-22: qty 60, 15d supply, fill #0
  Filled 2022-11-22: qty 57, 14d supply, fill #0

## 2022-11-21 NOTE — Progress Notes (Unsigned)
Maxwell Grant  [phone] [phone]   Name: Maxwell Grant Date: 11/21/2022 MRN: [id number]  DOB: 10-Apr-1966  Patient Care Team: Maxwell Limbo, MD as PCP - General (Family Medicine)    INTERVAL HISTORY: Maxwell Grant is a 57 y.o. male with  oncologic medical history including new diagnosed non-small cell lung cancer (10/2022), as well as a history of COPD, diabetes mellitus, hypertension, dyslipidemia as well as history of osteomyelitis of the back.  Palliative ask to see for symptom and pain management and goals of care.     SOCIAL HISTORY:     reports that he has been smoking cigarettes. He has been smoking an average of 2 packs per day. He has never used smokeless tobacco. He reports current alcohol use of about 1.0 standard drink of alcohol per week. He reports current drug use. Drug: Marijuana.  ADVANCE DIRECTIVES:    CODE STATUS:   PAST MEDICAL HISTORY: Past Medical History:  Diagnosis Date   CHF (congestive heart failure) ([facility])    COPD (chronic obstructive pulmonary disease) ([facility])    Diabetes mellitus without complication ([facility])    Dyspnea    Hypertension     ALLERGIES:  is allergic to statins, aspirin, flexeril [cyclobenzaprine], and penicillins.  MEDICATIONS:  Current Outpatient Medications  Medication Sig Dispense Refill   Accu-Chek FastClix Lancets MISC Use to test blood sugar 4 times a day (Patient not taking: Reported on 11/02/2022) 102 each 0   albuterol (VENTOLIN HFA) 108 (90 Base) MCG/ACT inhaler Inhale 1-2 puffs into the lungs every 4 (four) hours as needed for wheezing or shortness of breath.     amLODipine (NORVASC) 10 MG tablet Take 10 mg by mouth in the morning.     blood glucose meter kit and supplies KIT Use to test blood sugar 4 times a day (Patient not taking: Reported on [id number]) 1 each 0   folic acid (FOLVITE) 1 MG tablet Take 1 tablet (1 mg total) by mouth daily. 30 tablet 4   furosemide  (LASIX) 20 MG tablet Take 1 tablet (20 mg total) by mouth once as needed for up to 1 dose. As long as your systolic blood pressure is >120 7 tablet 0   glimepiride (AMARYL) 2 MG tablet Take 2 mg by mouth in the morning.     lidocaine-prilocaine (EMLA) cream Apply to [facility] Prior to Access as needed 30 g 2   LORazepam (ATIVAN) 0.5 MG tablet Take 1 tablet (0.5 mg total) by mouth daily as needed for anxiety. Take one tablet 30 min prior to PET scan (Patient not taking: Reported on 11/14/2022) 4 tablet 0   methocarbamol (ROBAXIN) 500 MG tablet Take 1 tablet (500 mg total) by mouth every 12 (twelve) hours as needed for muscle spasms. (Patient not taking: Reported on 11/14/2022) 10 tablet 0   oxyCODONE (OXYCONTIN) 20 mg 12 hr tablet Take 20 mg by mouth every 12 (twelve) hours.     oxyCODONE-acetaminophen (PERCOCET) 7.5-325 MG tablet Take 1 tablet by mouth every 6 (six) hours as needed for severe pain. 60 tablet 0   prochlorperazine (COMPAZINE) 10 MG tablet Take 1 tablet (10 mg total) by mouth every 6 (six) hours as needed for nausea or vomiting. (Patient not taking: Reported on 11/14/2022) 30 tablet 0   umeclidinium-vilanterol (ANORO ELLIPTA) 62.5-25 MCG/ACT AEPB Inhale 1 puff into the lungs daily. 60 each 11   No current facility-administered medications for this visit.    VITAL SIGNS: There were  no vitals taken for this visit. There were no vitals filed for this visit.  Estimated body mass index is 30.91 kg/m as calculated from the following:   Height as of 11/14/22: '5\' 6"'$  (1.676 m).   Weight as of 11/14/22: 191 lb 8 oz (86.9 kg).   PERFORMANCE STATUS (ECOG) : 2 - Symptomatic, <50% confined to bed   Physical Exam General: NAD, in wheelchair Cardiovascular: regular rate and rhythm Pulmonary: normal breathing pattern  Abdomen: soft, nontender, + bowel sounds Extremities: bilateral lower extremity pitting edema, no joint deformities Skin: no rashes Neurological: AAO x4, drowsy    IMPRESSION:  [name] presents to clinic for symptom management follow-up. No acute distress. Taking things one day at a time. Continues to have bilateral lower extremity edema however since getting hospital bed he and family has notice some improvement. Family reports swelling goes down overnight however after several hours of getting up swelling increases. We discussed making she he is keeping his legs elevated during the day. He is spending most of his time in his rocking chair or on the couch.    Neoplasm related pain JR endorses ongoing back pain however has improved with hospital bed and the ability to adjust head of bed. Family shares improvement in fatigue and mentation given he has been able to get some restful sleep.   Continues to have back pain but is not as severe. Denies confusion or changes in mentation since adjustments made.  We discussed at length current regimen. He is taking Oxycodone 20 mg every 12 hours (decreased  5 days ago due to concerns of oversedation), Percocet 1-2 tablets (7.5-'15mg'$ ) every 6 hours as needed. He is taking medications appropriately. Has been able to extend breakthrough medications out past 6 hours on several occasions.    Maxwell Grant and his sister understands difficulty in managing pain safely while preventing oversedation placing him at risk of injury or respiratory depression. He is somewhat comfortable sitting up. We discussed if he can tolerate radiation this will hopefully afford some relief with his pain. He was not able to tolerate simulation on last week. We will continue to closely monitor.    2.  Constipation Improved with daily regimen of Miralax and Senna-S   3.  Decreased appetite/weight loss Appetite is up and down. Some days are better than others. Weight is down some at 186lbs previously 190lbs.    4.  Lower extremity edema Instructed to continue taking lasix as prescribed. He has upcoming appointment with his pulmonologist this week  also.   I discussed the importance of continued conversation with family and their medical providers regarding overall plan of care and treatment options, ensuring decisions are within the context of the patients values and GOCs.  PLAN:  OxyContin 20 mg every 12 hours  Oxycodone 7.5/325 -'15mg'$  every 6 hours as needed for breakthrough pain.  MiraLAX twice daily Colace daily Education on nutrition. Ongoing symptom management support and goals of care discussions. I will plan to see patient back in a week in collaboration to other oncology appointments.    Patient expressed understanding and was in agreement with this plan. He also understands that He can call the clinic at any time with any questions, concerns, or complaints.   Any controlled substances utilized were prescribed in the context of palliative care. PDMP has been reviewed.     Time Total: 40 min  Visit consisted of counseling and education dealing with the complex and emotionally intense issues of symptom management and  palliative care in the setting of serious and potentially life-threatening illness.Greater than 50%  of this time was spent counseling and coordinating care related to the above assessment and plan.  Alda Lea, AGPCNP-BC  Palliative Medicine Team/[place]

## 2022-11-22 ENCOUNTER — Other Ambulatory Visit (HOSPITAL_COMMUNITY): Payer: Self-pay

## 2022-11-22 ENCOUNTER — Encounter: Payer: Self-pay | Admitting: Internal Medicine

## 2022-11-22 ENCOUNTER — Ambulatory Visit: Payer: Medicaid Other

## 2022-11-22 ENCOUNTER — Telehealth: Payer: Self-pay

## 2022-11-22 ENCOUNTER — Ambulatory Visit (INDEPENDENT_AMBULATORY_CARE_PROVIDER_SITE_OTHER): Payer: Medicaid Other | Admitting: Pulmonary Disease

## 2022-11-22 ENCOUNTER — Encounter (HOSPITAL_BASED_OUTPATIENT_CLINIC_OR_DEPARTMENT_OTHER): Payer: Self-pay | Admitting: Pulmonary Disease

## 2022-11-22 VITALS — BP 124/64 | HR 107 | Ht 70.0 in | Wt 186.2 lb

## 2022-11-22 DIAGNOSIS — J9611 Chronic respiratory failure with hypoxia: Secondary | ICD-10-CM

## 2022-11-22 DIAGNOSIS — M7989 Other specified soft tissue disorders: Secondary | ICD-10-CM | POA: Diagnosis not present

## 2022-11-22 DIAGNOSIS — I50812 Chronic right heart failure: Secondary | ICD-10-CM | POA: Diagnosis not present

## 2022-11-22 MED ORDER — PREDNISONE 10 MG PO TABS
ORAL_TABLET | ORAL | 0 refills | Status: AC
  Filled 2022-11-22: qty 14, 12d supply, fill #0

## 2022-11-22 MED ORDER — TRELEGY ELLIPTA 100-62.5-25 MCG/ACT IN AEPB
1.0000 | INHALATION_SPRAY | Freq: Every day | RESPIRATORY_TRACT | 5 refills | Status: DC
  Filled 2022-11-22: qty 60, 30d supply, fill #0

## 2022-11-22 NOTE — Patient Instructions (Signed)
Trelegy one puff daily, and rinse your mouth after each use  Stop using anoro once you start using trelegy  Prednisone 10 mg pill >> 2 pills daily for 4 days, 1 pill daily for 4 days, 1/2 pill daily for 4 days  Will arrange for overnight oxygen test, echocardiogram, and leg ultrasound  Follow up in 4 weeks

## 2022-11-22 NOTE — Progress Notes (Signed)
[facility]  Chief Complaint  Patient presents with   Follow-up    Feet swelling     Constitutional:  BP 124/64 (BP Location: Right Arm, Cuff Size: Normal)   Pulse (!) 107   Ht '5\' 10"'$  (1.778 m)   Wt 186 lb 3.2 oz (84.5 kg)   SpO2 92%   BMI 26.72 kg/m   Past Medical History:  Hypertension  Past Surgical History:  He  has a past surgical history that includes No past surgeries; Video bronchoscopy with endobronchial ultrasound (Right, 10/30/2022); Radiology with anesthesia (10/30/2022); Bronchial needle aspiration biopsy (10/30/2022); and IR IMAGING GUIDED PORT INSERTION (11/17/2022).  Brief Summary:  Maxwell Grant is a 57 y.o. male smoker with COPD, chronic respiratory failure and lung mass.      Subjective:   He is here with his wife.  He had bronchoscopy with Dr. Lamonte Sakai on 10/30/22.  Showed adenocarcinoma, consistent with Stage IV NSCLC.  He has been seen by oncology and radiation oncology.  He smokes 1/2 to 1 ppd.  Has cough with clear sputum.  Not having fever, or hemoptysis.  Has more leg swelling, and gets worse during the course of the day.  Lt leg worse than Rt and is more tender.  Has trouble walking or laying flat due to back and hip pains.  Uses Trilogy and 3 liters oxygen at night.  Physical Exam:   Appearance - well kempt, sitting in wheelchair  ENMT - no sinus tenderness, no oral exudate, no LAN, Mallampati 3 airway, no stridor  Respiratory - b/l rhonchi and faint wheeze  CV - s1s2 regular rate and rhythm, no murmurs  Ext - Lt > Rt lower leg swelling  Skin - no rashes  Psych - normal mood and affect      Pulmonary testing:  ABG on 3.5 liters 03/17/21 >> pH 7.48, PCO2 55.4, PO2 56.9 PFT 10/18/21 >> FEV1 1.65 (49%), FEV1% 48, TLC 6.57 (104%), RV 2.79 (138%), DLCO 61%  Chest Imaging:  V/Q scan 03/13/21 >> very low probability for PE CT angio chest 10/17/22 >> 5/6 x 5.6 RUL mass, 2.9 x 2.0 cm Rt hilar LAN, 2.4 x  1.6 cm subcarinal LAN, centrilobular emphysema  Sleep Tests:  Trilogy 03/18/21 to 10/09/21 >> used on 176 of 195 days with average 4 hrs 32 min.  Average Ti 1.4 sec, RR 21.    Cardiac Tests:  Echo 03/13/21 >> EF 60 to 65%, mild LVH  Social History:  He  reports that he has been smoking cigarettes. He has been smoking an average of 2 packs per day. He has never used smokeless tobacco. He reports current alcohol use of about 1.0 standard drink of alcohol per week. He reports current drug use. Drug: Marijuana.  Family History:  His family history is not on file.     Assessment/Plan:   COPD with emphysema. - will give him course of prednisone - change from anoro to trelegy 100 one puff daily - prn albuterol  Tobacco abuse. - he will try quitting on his own  Chronic respiratory failure with hypoxia and hypercapnia. - using Trilogy home vent and 3 liters oxygen at night - TTV-VAPS-AE, min EPAP 5/max EPAP 15 cm H2O, Pinsp 5 cm H2O, Pinsp max 20 cm H2O, Ti min 0.7 sec/Ti max 2 sec with auto Ti, target Vt 500 ml - uses [employer] - will arrange for overnight oximetry with Trilogy and 3 liters, and then determine  if he needs adjustment to his set up  Leg swelling. 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 - diagnosed February 2024 - metastatic disease to T11, L2, L5, pelvis - followed by Dr. Julien Nordmann with oncology and Dr. Lisbeth Renshaw of radiation oncology  Time Spent Involved in Patient Care on Day of Examination:  37 minutes  Follow up:   Patient Instructions  Trelegy one puff daily, and rinse your mouth after each use  Stop using anoro once you start using trelegy  Prednisone 10 mg pill >> 2 pills daily for 4 days, 1 pill daily for 4 days, 1/2 pill daily for 4 days  Will arrange for overnight oxygen test, echocardiogram, and leg ultrasound  Follow up in 4  weeks  Medication List:   Allergies as of 11/22/2022       Reactions   Statins Other (See Comments)   Joint pains   Aspirin Diarrhea, Nausea And Vomiting   Flexeril [cyclobenzaprine] Other (See Comments)   hallucinations   Penicillins Diarrhea, Nausea And Vomiting        Medication List        Accurate as of November 22, 2022  9:20 AM. If you have any questions, ask your nurse or doctor.          STOP taking these medications    Anoro Ellipta 62.5-25 MCG/ACT Aepb Generic drug: umeclidinium-vilanterol Stopped by: Chesley Mires, MD       TAKE these medications    Accu-Chek FastClix Lancets Misc Use to test blood sugar 4 times a day   Accu-Chek Guide w/Device Kit Use to test blood sugar 4 times a day   albuterol 108 (90 Base) MCG/ACT inhaler Commonly known as: VENTOLIN HFA Inhale 1-2 puffs into the lungs every 4 (four) hours as needed for wheezing or shortness of breath.   amLODipine 10 MG tablet Commonly known as: NORVASC Take 10 mg by mouth in the morning.   folic acid 1 MG tablet Commonly known as: FOLVITE Take 1 tablet (1 mg total) by mouth daily.   furosemide 20 MG tablet Commonly known as: LASIX Take 1 tablet (20 mg total) by mouth once as needed for up to 1 dose. As long as your systolic blood pressure is >120   glimepiride 2 MG tablet Commonly known as: AMARYL Take 2 mg by mouth in the morning.   lidocaine-prilocaine cream Commonly known as: EMLA Apply to [facility] Prior to Access as needed   LORazepam 0.5 MG tablet Commonly known as: ATIVAN Take 1 tablet (0.5 mg total) by mouth daily as needed for anxiety. Take one tablet 30 min prior to PET scan   methocarbamol 500 MG tablet Commonly known as: ROBAXIN Take 1 tablet (500 mg total) by mouth every 12 (twelve) hours as needed for muscle spasms.   oxyCODONE 20 mg 12 hr tablet Commonly known as: OXYCONTIN Take 20 mg by mouth every 12 (twelve) hours.   oxyCODONE-acetaminophen 7.5-325 MG  tablet Commonly known as: PERCOCET Take 1 tablet by mouth every 6 hours as needed for severe pain.   predniSONE 10 MG tablet Commonly known as: DELTASONE Take 2 tablets (20 mg total) by mouth daily with breakfast for 4 days, THEN 1 tablet (10 mg total) daily with breakfast for 4 days, THEN 0.5 tablets (5 mg total) daily with breakfast for 4 days. Start taking on: November 22, 2022 Started by: Chesley Mires,  MD   prochlorperazine 10 MG tablet Commonly known as: COMPAZINE Take 1 tablet (10 mg total) by mouth every 6 (six) hours as needed for nausea or vomiting.   Trelegy Ellipta 100-62.5-25 MCG/ACT Aepb Generic drug: Fluticasone-Umeclidin-Vilant Inhale 1 puff into the lungs daily in the afternoon. Started by: Chesley Mires, MD        Signature:  Chesley Mires, MD [place] Pager - [phone] 11/22/2022, 9:20 AM

## 2022-11-22 NOTE — Telephone Encounter (Signed)
PA request received via CMM for Trelegy Ellipta 100-62.5-25MCG/ACT aerosol powder  PA has been submitted to [employer] and is pending determination.  Key: [id number]

## 2022-11-22 NOTE — Telephone Encounter (Signed)
[phone] is Sister's #.   She is calling about PT Prior Auth. Wonders why [facility] was submitted to  when they went to pick it up at [facility]  Please call to advise how a PA works, how long to expect answer and to address Pharmacy location question. Thanks.

## 2022-11-23 ENCOUNTER — Ambulatory Visit (HOSPITAL_BASED_OUTPATIENT_CLINIC_OR_DEPARTMENT_OTHER): Payer: Medicaid Other | Admitting: Pulmonary Disease

## 2022-11-23 ENCOUNTER — Ambulatory Visit: Payer: Medicaid Other

## 2022-11-23 ENCOUNTER — Encounter (HOSPITAL_COMMUNITY): Payer: Self-pay

## 2022-11-23 NOTE — Telephone Encounter (Signed)
[employer] is their pharmacy [employer] which is what pays for the medication.

## 2022-11-23 NOTE — Telephone Encounter (Signed)
PA has been DENIED due to:   Per your health plan's criteria, this drug is covered if you meet the following: One of the following:  (1) You have failed at least two preferred drugs as confirmed by claims history or submission of medical records. The preferred drugs: brand Symbicort, brand Advair Diskus, Dulera.  (2) You cannot use two preferred drugs (please specify contraindication or intolerance). The information provided does not show that you meet the criteria listed above

## 2022-11-24 ENCOUNTER — Ambulatory Visit: Payer: Medicaid Other

## 2022-11-24 ENCOUNTER — Telehealth (HOSPITAL_BASED_OUTPATIENT_CLINIC_OR_DEPARTMENT_OTHER): Payer: Self-pay | Admitting: Pulmonary Disease

## 2022-11-24 MED ORDER — TRELEGY ELLIPTA 100-62.5-25 MCG/ACT IN AEPB: 1.0000 | INHALATION_SPRAY | Freq: Every day | RESPIRATORY_TRACT | 0 refills | Status: DC

## 2022-11-24 NOTE — Telephone Encounter (Signed)
Dr Halford Chessman:  PA has been DENIED due to:    Per your health plan's criteria, this drug is covered if you meet the following: One of the following:  (1) You have failed at least two preferred drugs as confirmed by claims history or submission of medical records. The preferred drugs: brand Symbicort, brand Advair Diskus, Dulera.  (2) You cannot use two preferred drugs (please specify contraindication or intolerance). The information provided does not show that you meet the criteria listed above

## 2022-11-24 NOTE — Telephone Encounter (Signed)
Pt sister calling asking for sample of trelgy until he can get his script. Checked with [place] location but currently out. Routing to [employer]. triage to see if any samples available. Pts sister aware may have to go to [address] to pick up. Please see prior encounter and please advise and call pts sister back.

## 2022-11-24 NOTE — Telephone Encounter (Signed)
Called and spoke with Maxwell Grant. Advised linda that I would sit samples up front for her to come pick up for patient. She verbalized understanding.   Nothing further needed.

## 2022-11-27 ENCOUNTER — Ambulatory Visit: Payer: Medicaid Other

## 2022-11-27 NOTE — Progress Notes (Unsigned)
[facility]  [phone] [phone]   Name: Maxwell Grant Date: 11/27/2022 MRN: [id number]  DOB: [birth year]  Patient Care Team: Bernerd Limbo, MD as PCP - General (Family Medicine)    INTERVAL HISTORY: Maxwell Grant is a 57 y.o. male with  oncologic medical history including new diagnosed non-small cell lung cancer (10/2022), as well as a history of COPD, diabetes mellitus, hypertension, dyslipidemia as well as history of osteomyelitis of the back.  Palliative ask to see for symptom and pain management and goals of care.     SOCIAL HISTORY:     reports that he has been smoking cigarettes. He has been smoking an average of 2 packs per day. He has never used smokeless tobacco. He reports current alcohol use of about 1.0 standard drink of alcohol per week. He reports current drug use. Drug: Marijuana.  ADVANCE DIRECTIVES:    CODE STATUS:   PAST MEDICAL HISTORY: Past Medical History:  Diagnosis Date   CHF (congestive heart failure) (HCC)    COPD (chronic obstructive pulmonary disease) (HCC)    Diabetes mellitus without complication (HCC)    Dyspnea    Hypertension     ALLERGIES:  is allergic to statins, aspirin, flexeril [cyclobenzaprine], and penicillins.  MEDICATIONS:  Current Outpatient Medications  Medication Sig Dispense Refill   Accu-Chek FastClix Lancets MISC Use to test blood sugar 4 times a day 102 each 0   albuterol (VENTOLIN HFA) 108 (90 Base) MCG/ACT inhaler Inhale 1-2 puffs into the lungs every 4 (four) hours as needed for wheezing or shortness of breath.     amLODipine (NORVASC) 10 MG tablet Take 10 mg by mouth in the morning.     blood glucose meter kit and supplies KIT Use to test blood sugar 4 times a day 1 each 0   Fluticasone-Umeclidin-Vilant (TRELEGY ELLIPTA) 100-62.5-25 MCG/ACT AEPB Inhale 1 puff into the lungs daily in the afternoon. 60 each 5   Fluticasone-Umeclidin-Vilant (TRELEGY ELLIPTA)  100-62.5-25 MCG/ACT AEPB Inhale 1 puff into the lungs daily. 60 each 0   folic acid (FOLVITE) 1 MG tablet Take 1 tablet (1 mg total) by mouth daily. 30 tablet 4   furosemide (LASIX) 20 MG tablet Take 1 tablet (20 mg total) by mouth once as needed for up to 1 dose. As long as your systolic blood pressure is >120 7 tablet 0   glimepiride (AMARYL) 2 MG tablet Take 2 mg by mouth in the morning.     lidocaine-prilocaine (EMLA) cream Apply to [facility] Prior to Access as needed 30 g 2   LORazepam (ATIVAN) 0.5 MG tablet Take 1 tablet (0.5 mg total) by mouth daily as needed for anxiety. Take one tablet 30 min prior to PET scan 4 tablet 0   methocarbamol (ROBAXIN) 500 MG tablet Take 1 tablet (500 mg total) by mouth every 12 (twelve) hours as needed for muscle spasms. 10 tablet 0   oxyCODONE (OXYCONTIN) 20 mg 12 hr tablet Take 20 mg by mouth every 12 (twelve) hours.     oxyCODONE-acetaminophen (PERCOCET) 7.5-325 MG tablet Take 1 tablet by mouth every 6 hours as needed for severe pain. 60 tablet 0   predniSONE (DELTASONE) 10 MG tablet Take 2 tablets (20 mg total) by mouth daily with breakfast for 4 days, THEN 1 tablet (10 mg total) daily with breakfast for 4 days, THEN 0.5 tablets (5 mg total) daily with breakfast for 4 days. 14 tablet 0   prochlorperazine (COMPAZINE) 10 MG  tablet Take 1 tablet (10 mg total) by mouth every 6 (six) hours as needed for nausea or vomiting. 30 tablet 0   No current facility-administered medications for this visit.    VITAL SIGNS: There were no vitals taken for this visit. There were no vitals filed for this visit.  Estimated body mass index is 26.72 kg/m as calculated from the following:   Height as of 11/22/22: '5\' 10"'$  (1.778 m).   Weight as of 11/22/22: 186 lb 3.2 oz (84.5 kg).   PERFORMANCE STATUS (ECOG) : 2 - Symptomatic, <50% confined to bed   Physical Exam General: NAD, in wheelchair Cardiovascular: regular rate and rhythm Pulmonary: normal breathing pattern  Abdomen:  soft, nontender, + bowel sounds Extremities: bilateral lower extremity pitting edema, no joint deformities Skin: no rashes Neurological: AAO x4, drowsy   IMPRESSION: [name] presented to clinic today. Family present. No acute distress. Looks much better than previous visits. Sitting up in wheelchair. Lower extremity edema much improved. Is sleeping in his hospital bed. Appetite is good. Denies nausea, vomiting, constipation, or diarrhea.    Neoplasm related pain [name] reports his pain has improved. He is tolerating lying down a little better than before. He is appreciative of this. His pain is well controlled control on current regimen. He is looking forward to his visit with Dr. Julien Nordmann next week to determine how he is doing with treatment and what next steps look like for him.   We discussed at length current regimen. He is taking Oxycodone 20 mg every 12 hours, Percocet 7.'5mg'$  every 6 hours as needed. He is taking medications appropriately. He is not requiring breakthrough medication around the clock.   We will continue to closely monitor and adjust medications as needed.    2.  Constipation Improved with daily regimen of Miralax and Senna-S    I discussed the importance of continued conversation with family and their medical providers regarding overall plan of care and treatment options, ensuring decisions are within the context of the patients values and GOCs.  PLAN:  OxyContin 20 mg every 12 hours  Oxycodone 7.5/325 -'15mg'$  every 6 hours as needed for breakthrough pain. Not requiring around the clock.  MiraLAX twice daily Colace daily Education on nutrition. Ongoing symptom management support and goals of care discussions. I will plan to see patient back in 1-2 weeks in collaboration to other oncology appointments.    Patient expressed understanding and was in agreement with this plan. He also understands that He can call the clinic at any time with any questions,  concerns, or complaints.    Any controlled substances utilized were prescribed in the context of palliative care. PDMP has been reviewed.    Time Total: 30 min   Visit consisted of counseling and education dealing with the complex and emotionally intense issues of symptom management and palliative care in the setting of serious and potentially life-threatening illness.Greater than 50%  of this time was spent counseling and coordinating care related to the above assessment and plan.  Alda Lea, AGPCNP-BC  Palliative Medicine Team/[facility]

## 2022-11-27 NOTE — Telephone Encounter (Signed)
He has COPD with emphysema.  He has been tried on LABA/LAMA combination with anoro.  He had progression of symptoms.  That is why I wanted to increase his inhaler therapy to include ICS with LABA and LAMA by transitioning to Trelegy.    The list of insurance preferred inhalers are all ICS/LABA combinations (symbicort, advair, dulera).  Most recent guidelines for COPD management specifically recommend AGAINST using ICS/LABA combinations unless there is concern for coexisting asthma, which is not the case for this patient.  His insurance plan is recommending therapy that is not current standard of care.  Please ask his insurance carrier to provide their scientific rationale for recommending ICS/LABA combination as preferred option for COPD management.

## 2022-11-28 ENCOUNTER — Encounter (HOSPITAL_BASED_OUTPATIENT_CLINIC_OR_DEPARTMENT_OTHER): Payer: Self-pay | Admitting: Pulmonary Disease

## 2022-11-28 ENCOUNTER — Inpatient Hospital Stay: Payer: Medicaid Other

## 2022-11-28 ENCOUNTER — Inpatient Hospital Stay (HOSPITAL_BASED_OUTPATIENT_CLINIC_OR_DEPARTMENT_OTHER): Payer: Medicaid Other | Admitting: Nurse Practitioner

## 2022-11-28 ENCOUNTER — Ambulatory Visit: Payer: Medicaid Other

## 2022-11-28 ENCOUNTER — Encounter: Payer: Self-pay | Admitting: Nurse Practitioner

## 2022-11-28 ENCOUNTER — Telehealth (HOSPITAL_BASED_OUTPATIENT_CLINIC_OR_DEPARTMENT_OTHER): Payer: Self-pay | Admitting: Pulmonary Disease

## 2022-11-28 VITALS — BP 127/73 | HR 96 | Temp 98.3°F | Resp 18

## 2022-11-28 DIAGNOSIS — G893 Neoplasm related pain (acute) (chronic): Secondary | ICD-10-CM

## 2022-11-28 DIAGNOSIS — R530 Neoplastic (malignant) related fatigue: Secondary | ICD-10-CM

## 2022-11-28 DIAGNOSIS — C3490 Malignant neoplasm of unspecified part of unspecified bronchus or lung: Secondary | ICD-10-CM

## 2022-11-28 DIAGNOSIS — R630 Anorexia: Secondary | ICD-10-CM

## 2022-11-28 DIAGNOSIS — Z515 Encounter for palliative care: Secondary | ICD-10-CM | POA: Diagnosis not present

## 2022-11-28 DIAGNOSIS — Z5112 Encounter for antineoplastic immunotherapy: Secondary | ICD-10-CM | POA: Diagnosis not present

## 2022-11-28 LAB — CBC WITH DIFFERENTIAL/PLATELET
Abs Immature Granulocytes: 0.03 10*3/uL (ref 0.00–0.07)
Basophils Absolute: 0 10*3/uL (ref 0.0–0.1)
Basophils Relative: 0 %
Eosinophils Absolute: 0.4 10*3/uL (ref 0.0–0.5)
Eosinophils Relative: 5 %
HCT: 37.5 % — ABNORMAL LOW (ref 39.0–52.0)
Hemoglobin: 11.9 g/dL — ABNORMAL LOW (ref 13.0–17.0)
Immature Granulocytes: 0 %
Lymphocytes Relative: 9 %
Lymphs Abs: 0.7 10*3/uL (ref 0.7–4.0)
MCH: 28.3 pg (ref 26.0–34.0)
MCHC: 31.7 g/dL (ref 30.0–36.0)
MCV: 89.1 fL (ref 80.0–100.0)
Monocytes Absolute: 0.5 10*3/uL (ref 0.1–1.0)
Monocytes Relative: 6 %
Neutro Abs: 6.2 10*3/uL (ref 1.7–7.7)
Neutrophils Relative %: 80 %
Platelets: 238 10*3/uL (ref 150–400)
RBC: 4.21 MIL/uL — ABNORMAL LOW (ref 4.22–5.81)
RDW: 17.1 % — ABNORMAL HIGH (ref 11.5–15.5)
WBC: 7.7 10*3/uL (ref 4.0–10.5)
nRBC: 0 % (ref 0.0–0.2)

## 2022-11-28 LAB — COMPREHENSIVE METABOLIC PANEL
ALT: 32 U/L (ref 0–44)
AST: 12 U/L — ABNORMAL LOW (ref 15–41)
Albumin: 3.5 g/dL (ref 3.5–5.0)
Alkaline Phosphatase: 82 U/L (ref 38–126)
Anion gap: 6 (ref 5–15)
BUN: 14 mg/dL (ref 6–20)
CO2: 33 mmol/L — ABNORMAL HIGH (ref 22–32)
Calcium: 9 mg/dL (ref 8.9–10.3)
Chloride: 99 mmol/L (ref 98–111)
Creatinine, Ser: 0.8 mg/dL (ref 0.61–1.24)
GFR, Estimated: >60 mL/min (ref >60)
Glucose, Bld: 203 mg/dL — ABNORMAL HIGH (ref 70–99)
Potassium: 4.4 mmol/L (ref 3.5–5.1)
Sodium: 138 mmol/L (ref 135–145)
Total Bilirubin: 0.4 mg/dL (ref 0.3–1.2)
Total Protein: 6.6 g/dL (ref 6.5–8.1)

## 2022-11-28 NOTE — Telephone Encounter (Signed)
Mychart message sent by pt:  [name] "[name]"  P [facility] Clinical Pool (supporting Chesley Mires, MD)14 minutes ago (3:33 PM)    Received a denial today from [facility] reason being:  He has failed at least two preferred drugs as  confirmed by claims history :  Symbicort, Advair Diskus, Dulera.  He cannot use two preferred drugs   Chesley Noon Pickenpack-Cousar advised today that the doctors could also intervene and file the appeal on Maxwell Grant's behalf.  Let me know if I need to file appeal or if you can or if I just need to  try to get some patient assist discounts.  Thank you.      In a separate encounter, message has already been sent to Dr. Halford Chessman. Sending this to prior auth team for help with this.

## 2022-11-28 NOTE — Telephone Encounter (Signed)
Pts sister Maxwell Grant states Trelegy script denied because failed 2 preferred drugs symbicort, and advair. Are there any other options for the pt? Pt assistance program? Please advise and call pt sister back.

## 2022-11-28 NOTE — Telephone Encounter (Signed)
Dr. Halford Chessman, please advise on this for pt and pt's sister Vaughan Basta.

## 2022-11-29 ENCOUNTER — Ambulatory Visit: Payer: Medicaid Other

## 2022-11-29 ENCOUNTER — Other Ambulatory Visit (HOSPITAL_COMMUNITY): Payer: Self-pay

## 2022-11-29 MED ORDER — BUDESONIDE-FORMOTEROL FUMARATE 160-4.5 MCG/ACT IN AERO
2.0000 | INHALATION_SPRAY | Freq: Two times a day (BID) | RESPIRATORY_TRACT | 5 refills | Status: DC
  Filled 2022-11-29: qty 10.2, 30d supply, fill #0

## 2022-11-29 MED ORDER — SPIRIVA RESPIMAT 2.5 MCG/ACT IN AERS
2.0000 | INHALATION_SPRAY | Freq: Every day | RESPIRATORY_TRACT | 5 refills | Status: DC
  Filled 2022-11-29: qty 4, 30d supply, fill #0

## 2022-11-29 NOTE — Telephone Encounter (Signed)
Attempted to call pt's sister but unable to reach. Left message for her to return call.

## 2022-11-29 NOTE — Telephone Encounter (Signed)
Let pt. Sister know about the inhaler of what Dr. Halford Chessman said and understood and would like inhaler called into [facility].

## 2022-11-29 NOTE — Telephone Encounter (Signed)
There is phone message from 11/22/22 related to this.    Please let him know his insurance is requiring that he use their listed inhaler medication first before they would consider the inhaler that I have recommended.  Please send script for symbicort 160 two puffs bid and spiriva respimat two puffs daily.  This will take the place of medication combination he would have gotten with trelegy.

## 2022-11-29 NOTE — Telephone Encounter (Signed)
Spiriva and symbicort inhalers have been sent to pharmacy for pt. Nothing further needed.

## 2022-11-30 ENCOUNTER — Ambulatory Visit: Payer: Medicaid Other

## 2022-11-30 ENCOUNTER — Other Ambulatory Visit (HOSPITAL_COMMUNITY): Payer: Self-pay

## 2022-12-01 ENCOUNTER — Ambulatory Visit: Payer: Medicaid Other

## 2022-12-03 ENCOUNTER — Other Ambulatory Visit: Payer: Self-pay | Admitting: Nurse Practitioner

## 2022-12-03 DIAGNOSIS — Z515 Encounter for palliative care: Secondary | ICD-10-CM

## 2022-12-03 DIAGNOSIS — G893 Neoplasm related pain (acute) (chronic): Secondary | ICD-10-CM

## 2022-12-03 DIAGNOSIS — C3490 Malignant neoplasm of unspecified part of unspecified bronchus or lung: Secondary | ICD-10-CM

## 2022-12-03 MED ORDER — OXYCODONE HCL ER 20 MG PO T12A
20.0000 mg | EXTENDED_RELEASE_TABLET | Freq: Two times a day (BID) | ORAL | 0 refills | Status: DC
  Filled 2022-12-05: qty 60, 30d supply, fill #0

## 2022-12-03 MED ORDER — OXYCODONE-ACETAMINOPHEN 7.5-325 MG PO TABS
1.0000 | ORAL_TABLET | Freq: Four times a day (QID) | ORAL | 0 refills | Status: DC | PRN
  Filled 2022-12-03: qty 60, 15d supply, fill #0

## 2022-12-04 ENCOUNTER — Other Ambulatory Visit: Payer: Self-pay

## 2022-12-04 ENCOUNTER — Other Ambulatory Visit (HOSPITAL_COMMUNITY): Payer: Self-pay

## 2022-12-04 ENCOUNTER — Ambulatory Visit: Payer: Medicaid Other

## 2022-12-04 MED FILL — Dexamethasone Sodium Phosphate Inj 100 MG/10ML: INTRAMUSCULAR | Qty: 1 | Status: AC

## 2022-12-04 NOTE — Addendum Note (Signed)
Encounter addended by: Betsey Holiday on: 12/04/2022 2:40 PM  Actions taken: Imaging Exam ended

## 2022-12-05 ENCOUNTER — Other Ambulatory Visit: Payer: Self-pay | Admitting: Medical Oncology

## 2022-12-05 ENCOUNTER — Encounter: Payer: Self-pay | Admitting: Internal Medicine

## 2022-12-05 ENCOUNTER — Telehealth (HOSPITAL_COMMUNITY): Payer: Self-pay | Admitting: Pharmacy Technician

## 2022-12-05 ENCOUNTER — Ambulatory Visit: Payer: Medicaid Other

## 2022-12-05 ENCOUNTER — Inpatient Hospital Stay: Payer: Medicaid Other

## 2022-12-05 ENCOUNTER — Inpatient Hospital Stay: Payer: Medicaid Other | Admitting: Licensed Clinical Social Worker

## 2022-12-05 ENCOUNTER — Inpatient Hospital Stay (HOSPITAL_BASED_OUTPATIENT_CLINIC_OR_DEPARTMENT_OTHER): Payer: Medicaid Other | Admitting: Internal Medicine

## 2022-12-05 ENCOUNTER — Other Ambulatory Visit (HOSPITAL_COMMUNITY): Payer: Self-pay

## 2022-12-05 ENCOUNTER — Other Ambulatory Visit: Payer: Self-pay

## 2022-12-05 VITALS — BP 131/70 | HR 94 | Temp 97.8°F | Resp 16 | Wt 185.1 lb

## 2022-12-05 DIAGNOSIS — C3490 Malignant neoplasm of unspecified part of unspecified bronchus or lung: Secondary | ICD-10-CM

## 2022-12-05 DIAGNOSIS — Z95828 Presence of other vascular implants and grafts: Secondary | ICD-10-CM

## 2022-12-05 DIAGNOSIS — Z5112 Encounter for antineoplastic immunotherapy: Secondary | ICD-10-CM | POA: Diagnosis not present

## 2022-12-05 LAB — CMP (CANCER CENTER ONLY)
ALT: 16 U/L (ref 0–44)
AST: 11 U/L — ABNORMAL LOW (ref 15–41)
Albumin: 3.6 g/dL (ref 3.5–5.0)
Alkaline Phosphatase: 95 U/L (ref 38–126)
Anion gap: 6 (ref 5–15)
BUN: 14 mg/dL (ref 6–20)
CO2: 33 mmol/L — ABNORMAL HIGH (ref 22–32)
Calcium: 9 mg/dL (ref 8.9–10.3)
Chloride: 100 mmol/L (ref 98–111)
Creatinine: 0.83 mg/dL (ref 0.61–1.24)
GFR, Estimated: >60 mL/min (ref >60)
Glucose, Bld: 208 mg/dL — ABNORMAL HIGH (ref 70–99)
Potassium: 4.1 mmol/L (ref 3.5–5.1)
Sodium: 139 mmol/L (ref 135–145)
Total Bilirubin: 0.6 mg/dL (ref 0.3–1.2)
Total Protein: 6.7 g/dL (ref 6.5–8.1)

## 2022-12-05 LAB — CBC WITH DIFFERENTIAL (CANCER CENTER ONLY)
Abs Immature Granulocytes: 0.04 10*3/uL (ref 0.00–0.07)
Basophils Absolute: 0.1 10*3/uL (ref 0.0–0.1)
Basophils Relative: 1 %
Eosinophils Absolute: 0.4 10*3/uL (ref 0.0–0.5)
Eosinophils Relative: 5 %
HCT: 38.4 % — ABNORMAL LOW (ref 39.0–52.0)
Hemoglobin: 12 g/dL — ABNORMAL LOW (ref 13.0–17.0)
Immature Granulocytes: 1 %
Lymphocytes Relative: 14 %
Lymphs Abs: 1.1 10*3/uL (ref 0.7–4.0)
MCH: 27.9 pg (ref 26.0–34.0)
MCHC: 31.3 g/dL (ref 30.0–36.0)
MCV: 89.3 fL (ref 80.0–100.0)
Monocytes Absolute: 0.6 10*3/uL (ref 0.1–1.0)
Monocytes Relative: 8 %
Neutro Abs: 5.8 10*3/uL (ref 1.7–7.7)
Neutrophils Relative %: 71 %
Platelet Count: 281 10*3/uL (ref 150–400)
RBC: 4.3 MIL/uL (ref 4.22–5.81)
RDW: 17.8 % — ABNORMAL HIGH (ref 11.5–15.5)
WBC Count: 7.9 10*3/uL (ref 4.0–10.5)
nRBC: 0 % (ref 0.0–0.2)

## 2022-12-05 MED ORDER — SODIUM CHLORIDE 0.9 % IV SOLN
683.5000 mg | Freq: Once | INTRAVENOUS | Status: AC
  Administered 2022-12-05: 680 mg via INTRAVENOUS
  Filled 2022-12-05: qty 68

## 2022-12-05 MED ORDER — SODIUM CHLORIDE 0.9 % IV SOLN
150.0000 mg | Freq: Once | INTRAVENOUS | Status: AC
  Administered 2022-12-05: 150 mg via INTRAVENOUS
  Filled 2022-12-05: qty 150

## 2022-12-05 MED ORDER — HEPARIN SOD (PORK) LOCK FLUSH 100 UNIT/ML IV SOLN
500.0000 [IU] | Freq: Once | INTRAVENOUS | Status: AC | PRN
  Administered 2022-12-05: 500 [IU]

## 2022-12-05 MED ORDER — SODIUM CHLORIDE 0.9 % IV SOLN
10.0000 mg | Freq: Once | INTRAVENOUS | Status: AC
  Administered 2022-12-05: 10 mg via INTRAVENOUS
  Filled 2022-12-05: qty 10

## 2022-12-05 MED ORDER — SODIUM CHLORIDE 0.9 % IV SOLN
500.0000 mg/m2 | Freq: Once | INTRAVENOUS | Status: AC
  Administered 2022-12-05: 1000 mg via INTRAVENOUS
  Filled 2022-12-05: qty 40

## 2022-12-05 MED ORDER — SODIUM CHLORIDE 0.9 % IV SOLN
200.0000 mg | Freq: Once | INTRAVENOUS | Status: AC
  Administered 2022-12-05: 200 mg via INTRAVENOUS
  Filled 2022-12-05: qty 8

## 2022-12-05 MED ORDER — SODIUM CHLORIDE 0.9 % IV SOLN: Freq: Once | INTRAVENOUS | Status: AC

## 2022-12-05 MED ORDER — SODIUM CHLORIDE 0.9% FLUSH
10.0000 mL | INTRAVENOUS | Status: DC | PRN
  Administered 2022-12-05: 10 mL

## 2022-12-05 MED ORDER — ALTEPLASE 2 MG IJ SOLR
2.0000 mg | Freq: Once | INTRAMUSCULAR | Status: AC
  Administered 2022-12-05: 2 mg
  Filled 2022-12-05: qty 2

## 2022-12-05 MED ORDER — PALONOSETRON HCL INJECTION 0.25 MG/5ML
0.2500 mg | Freq: Once | INTRAVENOUS | Status: AC
  Administered 2022-12-05: 0.25 mg via INTRAVENOUS
  Filled 2022-12-05: qty 5

## 2022-12-05 NOTE — Telephone Encounter (Signed)
Patient Advocate Encounter  Prior Authorization for OxyCONTIN 20MG  er tablets has been approved.    PA# [id number] Insurance [employer] Electronic Prior Authorization Form Effective dates: 12/05/2022 through 03/07/2023      Lyndel Safe, [place] Patient Advocate Specialist [place] Patient Advocate Team Direct Number: [phone]  Fax: [phone]

## 2022-12-05 NOTE — Progress Notes (Signed)
No blood return was obtained at the time of [facility] access. This RN and another Therapist, sports both tried to obtain blood return. This RN educated Pt on cathflo and Pt was agreeable to receive cathlfo in PAC and to have a PIV inserted to start tx. Cathflo was administered at 1045. After 30 minutes no blood return was noted. IV team was called for PIV insertion as this RN could not obtain PIV access. At 1148 PIV was established by IV team d/t half of the catheter being out of the skin, PIV was not able to be used for chemo administration. Pt was made aware. At this time Pt verbalized wanting to leave. This RN notified Dr. Julien Nordmann and charge RN. Dr. Julien Nordmann recommended Pt have a dye study ordered and treatment to be rescheduled. This RN educated Pt on the recommendation. Pt verbalized understanding, but was not happy with this outcome. Pt again verbalized wanting to leave. This RN removed cathflo from [facility] at 1209 per Pt request. At this time blood return was noted. Pt saw blood return and stated "let's move on, it's working now". This RN clarified with Pt that he wanted to stay and receive treatment now that blood was obtained from [facility]. Pt stated "I am already here, it's working now, I am tired, but I will stay". This RN made Dr.Mohamed and charge RN aware. Per Dr.Mohamed since blood return was obtained there is no need for a dye study, Diane RN d/c the order for the dye study. This RN made Pt aware and he was agreeable to not have dye study done.

## 2022-12-05 NOTE — Telephone Encounter (Signed)
Patient Advocate Encounter   Received notification that prior authorization for OxyCONTIN 20MG  er tablets is required.   PA submitted on 12/05/2022 [place] [employer] Electronic Prior Authorization Form Status is pending       Lyndel Safe, [place] Patient Advocate Specialist [place] Patient Advocate Team Direct Number: [phone]  Fax: [phone]

## 2022-12-05 NOTE — Progress Notes (Signed)
Patient seen by MD today  Vitals are within treatment parameters.  Labs reviewed: and are within treatment parameters.  Per physician team, patient is ready for treatment and there are NO modifications to the treatment plan.  

## 2022-12-05 NOTE — Progress Notes (Signed)
[place] [phone]   Fax:([phone]) [phone]  OFFICE PROGRESS NOTE  Bernerd Limbo, MD [address] [place] [address]  DIAGNOSIS: Stage IV (T3, N2, M1 C) non-small cell lung cancer, adenocarcinoma presented with large right upper lobe lung mass in addition to right hilar and subcarinal lymphadenopathy in addition to multiple metastatic bone lesions involving the pelvis as well as the lower thoracic and lumbar spines in addition to metastatic disease to the pelvic muscles diagnosed in February 2024.   Detected Alteration(s) / Biomarker(s) Associated FDA-approved therapies Clinical Trial Availability% cfDNA or Amplification  KRAS G12C approved by FDA Adagrasib, Sotorasib Yes 29.3%  CDK4 Amplification None Yes High (+++)Plasma Copy Number: 8.0  PD-L1 expression 98%  PRIOR THERAPY: Palliative radiotherapy to the painful bone metastasis under the care of Dr. Lisbeth Renshaw  CURRENT THERAPY: Systemic chemotherapy with carboplatin for AUC of 5, Alimta 500 Mg/M2 and Keytruda 200 Mg IV every 3 weeks.  First dose November 14, 2022  INTERVAL HISTORY: Maxwell Grant 57 y.o. male returns to the clinic today for follow-up visit accompanied by his daughter.  The patient is feeling fine today with no concerning complaints except for the persistent chronic back pain and he is currently followed by the palliative care team.  He tolerated the first cycle of his treatment fairly well except for mild fatigue.  He denied having any significant nausea, vomiting, diarrhea or constipation.  He has no chest pain but continues to have mild shortness of breath and cough with no hemoptysis.  He has no fever or chills.  He is here today for evaluation before starting cycle #2 of his treatment.  MEDICAL HISTORY: Past Medical History:  Diagnosis Date   CHF (congestive heart failure) (HCC)    COPD (chronic obstructive pulmonary disease) (HCC)    Diabetes mellitus without  complication (HCC)    Dyspnea    Hypertension     ALLERGIES:  is allergic to statins, aspirin, flexeril [cyclobenzaprine], and penicillins.  MEDICATIONS:  Current Outpatient Medications  Medication Sig Dispense Refill   Accu-Chek FastClix Lancets MISC Use to test blood sugar 4 times a day 102 each 0   albuterol (VENTOLIN HFA) 108 (90 Base) MCG/ACT inhaler Inhale 1-2 puffs into the lungs every 4 (four) hours as needed for wheezing or shortness of breath.     folic acid (FOLVITE) 1 MG tablet Take 1 tablet (1 mg total) by mouth daily. 30 tablet 4   furosemide (LASIX) 20 MG tablet Take 1 tablet (20 mg total) by mouth once as needed for up to 1 dose. As long as your systolic blood pressure is >120 7 tablet 0   glimepiride (AMARYL) 2 MG tablet Take 2 mg by mouth in the morning.     lidocaine-prilocaine (EMLA) cream Apply to [facility] Prior to Access as needed 30 g 2   LORazepam (ATIVAN) 0.5 MG tablet Take 1 tablet (0.5 mg total) by mouth daily as needed for anxiety. Take one tablet 30 min prior to PET scan 4 tablet 0   methocarbamol (ROBAXIN) 500 MG tablet Take 1 tablet (500 mg total) by mouth every 12 (twelve) hours as needed for muscle spasms. 10 tablet 0   oxyCODONE (OXYCONTIN) 20 mg 12 hr tablet Take 1 tablet (20 mg total) by mouth every 12 (twelve) hours. 60 tablet 0   oxyCODONE-acetaminophen (PERCOCET) 7.5-325 MG tablet Take 1 tablet by mouth every 6 hours as needed for severe pain. 60 tablet 0  prochlorperazine (COMPAZINE) 10 MG tablet Take 1 tablet (10 mg total) by mouth every 6 (six) hours as needed for nausea or vomiting. 30 tablet 0   Tiotropium Bromide Monohydrate (SPIRIVA RESPIMAT) 2.5 MCG/ACT AERS Inhale 2 puffs into the lungs daily. 4 g 5   No current facility-administered medications for this visit.    SURGICAL HISTORY:  Past Surgical History:  Procedure Laterality Date   BRONCHIAL NEEDLE ASPIRATION BIOPSY  10/30/2022   Procedure: BRONCHIAL NEEDLE ASPIRATION BIOPSIES;  Surgeon:  Collene Gobble, MD;  Location: [facility] ENDOSCOPY;  Service: Cardiopulmonary;;   IR IMAGING GUIDED PORT INSERTION  11/17/2022   NO PAST SURGERIES     RADIOLOGY WITH ANESTHESIA  10/30/2022   Procedure: MRI WITH ANESTHESIA W/WO CONTRAST;  Surgeon: Collene Gobble, MD;  Location: [facility] ENDOSCOPY;  Service: Cardiopulmonary;;   VIDEO BRONCHOSCOPY WITH ENDOBRONCHIAL ULTRASOUND Right 10/30/2022   Procedure: VIDEO BRONCHOSCOPY WITH ENDOBRONCHIAL ULTRASOUND;  Surgeon: Collene Gobble, MD;  Location: [place];  Service: Cardiopulmonary;  Laterality: Right;    REVIEW OF SYSTEMS:  A comprehensive review of systems was negative except for: Constitutional: positive for fatigue Respiratory: positive for cough and dyspnea on exertion Musculoskeletal: positive for back pain   PHYSICAL EXAMINATION: General appearance: alert, cooperative, fatigued, and no distress Head: Normocephalic, without obvious abnormality, atraumatic Neck: no adenopathy, no JVD, supple, symmetrical, trachea midline, and thyroid not enlarged, symmetric, no tenderness/mass/nodules Lymph nodes: Cervical, supraclavicular, and axillary nodes normal. Resp: clear to auscultation bilaterally Back: symmetric, no curvature. ROM normal. No CVA tenderness. Cardio: regular rate and rhythm, S1, S2 normal, no murmur, click, rub or gallop GI: soft, non-tender; bowel sounds normal; no masses,  no organomegaly Extremities: extremities normal, atraumatic, no cyanosis or edema  ECOG PERFORMANCE STATUS: 1 - Symptomatic but completely ambulatory  Blood pressure 131/70, pulse 94, temperature 97.8 F (36.6 C), resp. rate 16, weight 185 lb 2 oz (84 kg), SpO2 92 %.  LABORATORY DATA: Lab Results  Component Value Date   WBC 7.9 12/05/2022   HGB 12.0 (L) 12/05/2022   HCT 38.4 (L) 12/05/2022   MCV 89.3 12/05/2022   PLT 281 12/05/2022      Chemistry      Component Value Date/Time   NA 139 12/05/2022 0821   K 4.1 12/05/2022 0821   CL 100 12/05/2022 0821    CO2 33 (H) 12/05/2022 0821   BUN 14 12/05/2022 0821   CREATININE 0.83 12/05/2022 0821      Component Value Date/Time   CALCIUM 9.0 12/05/2022 0821   ALKPHOS 95 12/05/2022 0821   AST 11 (L) 12/05/2022 0821   ALT 16 12/05/2022 0821   BILITOT 0.6 12/05/2022 0821       RADIOGRAPHIC STUDIES: IR IMAGING GUIDED PORT INSERTION  Result Date: 11/17/2022 INDICATION: 57 year old male with history of advanced stage lung cancer requiring central venous access for chemotherapy administration EXAM: IMPLANTED PORT A CATH PLACEMENT WITH ULTRASOUND AND FLUOROSCOPIC GUIDANCE COMPARISON:  None Available. MEDICATIONS: None. ANESTHESIA/SEDATION: Moderate (conscious) sedation was employed during this procedure. A total of Versed 1 mg and Fentanyl 50 mcg was administered intravenously. Moderate Sedation Time: 16 minutes. The patient's level of consciousness and vital signs were monitored continuously by radiology nursing throughout the procedure under my direct supervision. CONTRAST:  None FLUOROSCOPY TIME:  One mGy COMPLICATIONS: None immediate. PROCEDURE: The procedure, risks, benefits, and alternatives were explained to the patient. Questions regarding the procedure were encouraged and answered. The patient understands and consents to the procedure. The right neck and chest  were prepped with chlorhexidine in a sterile fashion, and a sterile drape was applied covering the operative field. Maximum barrier sterile technique with sterile gowns and gloves were used for the procedure. A timeout was performed prior to the initiation of the procedure. Ultrasound was used to examine the jugular vein which was compressible and free of internal echoes. A skin marker was used to demarcate the planned venotomy and port pocket incision sites. Local anesthesia was provided to these sites and the subcutaneous tunnel track with 1% lidocaine with 1:100,000 epinephrine. A small incision was created at the jugular access site and blunt  dissection was performed of the subcutaneous tissues. Under ultrasound guidance, the jugular vein was accessed with a 21 ga micropuncture needle and an 0.018" wire was inserted to the superior vena cava. Real-time ultrasound guidance was utilized for vascular access including the acquisition of a permanent ultrasound image documenting patency of the accessed vessel. A 5 Fr micropuncture set was then used, through which a 0.035" Rosen wire was passed under fluoroscopic guidance into the inferior vena cava. An 8 Fr dilator was then placed over the wire. A subcutaneous port pocket was then created along the upper chest wall utilizing a combination of sharp and blunt dissection. The pocket was irrigated with sterile saline, packed with gauze, and observed for hemorrhage. A single lumen "ISP" sized power injectable port was chosen for placement. The 8 Fr catheter was tunneled from the port pocket site to the venotomy incision. The port was placed in the pocket. The external catheter was trimmed to appropriate length. The dilator was exchanged for an 8 Fr peel-away sheath under fluoroscopic guidance. The catheter was then placed through the sheath and the sheath was removed. Final catheter positioning was confirmed and documented with a fluoroscopic spot radiograph. The port was accessed with a Huber needle, aspirated, and flushed with heparinized saline. The deep dermal layer of the port pocket incision was closed with interrupted 3-0 Vicryl suture. Dermabond was then placed over the port pocket and neck incisions. The patient tolerated the procedure well without immediate post procedural complication. FINDINGS: After catheter placement, the tip lies within the superior cavoatrial junction. The catheter aspirates and flushes normally and is ready for immediate use. IMPRESSION: Successful placement of a power injectable Port-A-Cath via the right internal jugular vein. The catheter is ready for immediate use. Ruthann Cancer,  MD [facility] Radiology Electronically Signed   By: Ruthann Cancer M.D.   On: 11/17/2022 16:24    ASSESSMENT AND PLAN: This is a very pleasant 56 years old white male with stage IV (T3, N2, M1 C) non-small cell lung cancer, adenocarcinoma presented with large right upper lobe lung mass in addition to right hilar and subcarinal lymphadenopathy in addition to multiple metastatic bone lesions involving the pelvis as well as the lower thoracic and lumbar spines in addition to metastatic disease to the pelvic muscles diagnosed in February 2024.  Molecular studies showed positive KRAS G12C mutation and PD-L1 expression of 98%. The patient is here today to start the first cycle of systemic chemotherapy with carboplatin for AUC of 5, Alimta 500 Mg/M2 and Keytruda 200 Mg IV every 3 weeks.  First dose November 14, 2022.  Status post 1 cycle.  He tolerated the first cycle of his treatment fairly well with no concerning adverse effect except for mild fatigue. I recommended for him to proceed with cycle #2 today as planned. For the persistent back pain he is currently followed  by the palliative care team and he received palliative radiation. I will see him back for follow-up visit in 3 weeks for evaluation before starting cycle #3. He is now able to lay flat on his back and we may consider repeating the PET scan with the first the staging workup after cycle #3. The patient was advised to call if he has any other concerning symptoms in the interval. The patient voices understanding of current disease status and treatment options and is in agreement with the current care plan.  All questions were answered. The patient knows to call the clinic with any problems, questions or concerns. We can certainly see the patient much sooner if necessary.  The total time spent in the appointment was 20 minutes.  Disclaimer: This note was dictated with voice recognition software.  Similar sounding words can inadvertently be transcribed and may not be corrected upon review.

## 2022-12-05 NOTE — Progress Notes (Signed)
[facility]  Initial Assessment   Emeril Bellman is a 57 y.o. year old male accompanied by patient and sister, Gaylan Gerold. [place] [facility] was referred by medical provider for assessment of psychosocial needs.   SDOH (Social Determinants of Health) assessments performed: Yes   SDOH Screenings   Food Insecurity: Food Insecurity Present (11/14/2022)  Housing: Low Risk  (11/14/2022)  Transportation Needs: No Transportation Needs (11/14/2022)  Utilities: At Risk (11/14/2022)  Depression (PHQ2-9): Low Risk  (11/14/2022)  Tobacco Use: High Risk (12/05/2022)     Distress Screen completed: No     No data to display            Family/Social Information:  Housing Arrangement: patient lives with his significant other, Leda Gauze.  Family members/support persons in your life? Pt has 3 siblings residing close by, all are involved in providing support for pt.  Pt's sister, Gaylan Gerold, is the main point of contact.  Vaughan Basta states she is pt's HCP and has documentation at home.  CSW requested a copy of paperwork for pt's chart which Vaughan Basta verbalized agreement to bring in.   Transportation concerns: no  Employment: Unemployed Pt states he has not worked in about 3 months.  Pt predominantly worked for [employer], so may not have enough work credits for [employer] source: Supported by [employer] Financial concerns: Yes, current concerns Type of concern: Utilities and Social worker access concerns: no Religious or spiritual practice: Yes-[employer] Currently in place:  none  Coping/ Adjustment to diagnosis: Patient understands treatment plan and what happens next? yes Concerns about diagnosis and/or treatment: How I will pay for the services I need and Quality of life Patient reported stressors: Finances and Adjusting to my illness Hopes and/or priorities: Pt's priority is to continue treatment w/ the hope of positive results Patient enjoys being outside and time  with family/ friends Current coping skills/ strengths: Motivation for treatment/growth  and Supportive family/friends     SUMMARY: Current SDOH Barriers:  Financial constraints related to limited financial resources.  Pt states he was approved for Medicaid and food stamps, but has not applied for SSI.  Clinical Social Work Clinical Goal(s):  Freight forwarder options for unmet needs related to:  Financial Strain   Interventions: Discussed common feeling and emotions when being diagnosed with cancer, and the importance of support during treatment Informed patient of the support team roles and support services at [facility] Provided [place] contact information and encouraged patient to call with any questions or concerns Referred patient to [employer], pt approved for [employer], and provided pt w/ a [employer].   Follow Up Plan: Patient will contact CSW with any support or resource needs Patient verbalizes understanding of plan: Yes    [name], LCSW   Patient is participating in a Managed Medicaid Plan:  Yes

## 2022-12-05 NOTE — Patient Instructions (Signed)
Steps to Quit Smoking Smoking tobacco is the leading cause of preventable death. It can affect almost every organ in the body. Smoking puts you and people around you at risk for many serious, long-lasting (chronic) diseases. Quitting smoking can be hard, but it is one of the best things that you can do for your health. It is never too late to quit. Do not give up if you cannot quit the first time. Some people need to try many times to quit. Do your best to stick to your quit plan, and talk with your doctor if you have any questions or concerns. How do I get ready to quit? Pick a date to quit. Set a date within the next 2 weeks to give you time to prepare. Write down the reasons why you are quitting. Keep this list in places where you will see it often. Tell your family, friends, and co-workers that you are quitting. Their support is important. Talk with your doctor about the choices that may help you quit. Find out if your health insurance will pay for these treatments. Know the people, places, things, and activities that make you want to smoke (triggers). Avoid them. What first steps can I take to quit smoking? Throw away all cigarettes at home, at work, and in your car. Throw away the things that you use when you smoke, such as ashtrays and lighters. Clean your car. Empty the ashtray. Clean your home, including curtains and carpets. What can I do to help me quit smoking? Talk with your doctor about taking medicines and seeing a counselor. You are more likely to succeed when you do both. If you are pregnant or breastfeeding: Talk with your doctor about counseling or other ways to quit smoking. Do not take medicine to help you quit smoking unless your doctor tells you to. Quit right away Quit smoking completely, instead of slowly cutting back on how much you smoke over a period of time. Stopping smoking right away may be more successful than slowly quitting. Go to counseling. In-person is best  if this is an option. You are more likely to quit if you go to counseling sessions regularly. Take medicine You may take medicines to help you quit. Some medicines need a prescription, and some you can buy over-the-counter. Some medicines may contain a drug called nicotine to replace the nicotine in cigarettes. Medicines may: Help you stop having the desire to smoke (cravings). Help to stop the problems that come when you stop smoking (withdrawal symptoms). Your doctor may ask you to use: Nicotine patches, gum, or lozenges. Nicotine inhalers or sprays. Non-nicotine medicine that you take by mouth. Find resources Find resources and other ways to help you quit smoking and remain smoke-free after you quit. They include: Online chats with a counselor. Phone quitlines. Printed self-help materials. Support groups or group counseling. Text messaging programs. Mobile phone apps. Use apps on your mobile phone or tablet that can help you stick to your quit plan. Examples of free services include Quit Guide from the CDC and smokefree.gov  What can I do to make it easier to quit?  Talk to your family and friends. Ask them to support and encourage you. Call a phone quitline, such as 1-800-QUIT-NOW, reach out to support groups, or work with a counselor. Ask people who smoke to not smoke around you. Avoid places that make you want to smoke, such as: Bars. Parties. Smoke-break areas at work. Spend time with people who do not smoke. Lower   the stress in your life. Stress can make you want to smoke. Try these things to lower stress: Getting regular exercise. Doing deep-breathing exercises. Doing yoga. Meditating. What benefits will I see if I quit smoking? Over time, you may have: A better sense of smell and taste. Less coughing and sore throat. A slower heart rate. Lower blood pressure. Clearer skin. Better breathing. Fewer sick days. Summary Quitting smoking can be hard, but it is one of  the best things that you can do for your health. Do not give up if you cannot quit the first time. Some people need to try many times to quit. When you decide to quit smoking, make a plan to help you succeed. Quit smoking right away, not slowly over a period of time. When you start quitting, get help and support to keep you smoke-free. This information is not intended to replace advice given to you by your health care provider. Make sure you discuss any questions you have with your health care provider. Document Revised: 08/26/2021 Document Reviewed: 08/26/2021 Elsevier Patient Education  2023 Elsevier Inc.  

## 2022-12-05 NOTE — Patient Instructions (Signed)
[place]  Discharge Instructions: Thank you for choosing [facility] to provide your oncology and hematology care.   If you have a lab appointment with the [facility], please go directly to the [facility] and check in at the registration area.   Wear comfortable clothing and clothing appropriate for easy access to any Portacath or PICC line.   We strive to give you quality time with your provider. You may need to reschedule your appointment if you arrive late (15 or more minutes).  Arriving late affects you and other patients whose appointments are after yours.  Also, if you miss three or more appointments without notifying the office, you may be dismissed from the clinic at the provider's discretion.      For prescription refill requests, have your pharmacy contact our office and allow 72 hours for refills to be completed.    Today you received the following chemotherapy and/or immunotherapy agents Keytruda, alimta, carboplatin.      To help prevent nausea and vomiting after your treatment, we encourage you to take your nausea medication as directed.  BELOW ARE SYMPTOMS THAT SHOULD BE REPORTED IMMEDIATELY: *FEVER GREATER THAN 100.4 F (38 C) OR HIGHER *CHILLS OR SWEATING *NAUSEA AND VOMITING THAT IS NOT CONTROLLED WITH YOUR NAUSEA MEDICATION *UNUSUAL SHORTNESS OF BREATH *UNUSUAL BRUISING OR BLEEDING *URINARY PROBLEMS (pain or burning when urinating, or frequent urination) *BOWEL PROBLEMS (unusual diarrhea, constipation, pain near the anus) TENDERNESS IN MOUTH AND THROAT WITH OR WITHOUT PRESENCE OF ULCERS (sore throat, sores in mouth, or a toothache) UNUSUAL RASH, SWELLING OR PAIN  UNUSUAL VAGINAL DISCHARGE OR ITCHING   Items with * indicate a potential emergency and should be followed up as soon as possible or go to the Emergency Department if any problems should occur.  Please show the CHEMOTHERAPY ALERT CARD or IMMUNOTHERAPY  ALERT CARD at check-in to the Emergency Department and triage nurse.  Should you have questions after your visit or need to cancel or reschedule your appointment, please contact [facility]  Dept: [phone]  and follow the prompts.  Office hours are 8:00 a.m. to 4:30 p.m. Monday - Friday. Please note that voicemails left after 4:00 p.m. may not be returned until the following business day.  We are closed weekends and major holidays. You have access to a nurse at all times for urgent questions. Please call the main number to the clinic Dept: [phone] and follow the prompts.   For any non-urgent questions, you may also contact your provider using MyChart. We now offer e-Visits for anyone 37 and older to request care online for non-urgent symptoms. For details visit [email or web address].   Also download the MyChart app! Go to the app store, search "MyChart", open the app, select [facility], and log in with your MyChart username and password.

## 2022-12-06 ENCOUNTER — Inpatient Hospital Stay: Payer: Medicaid Other | Admitting: Licensed Clinical Social Worker

## 2022-12-06 ENCOUNTER — Ambulatory Visit: Payer: Medicaid Other

## 2022-12-06 ENCOUNTER — Encounter (HOSPITAL_BASED_OUTPATIENT_CLINIC_OR_DEPARTMENT_OTHER): Payer: Self-pay | Admitting: Pulmonary Disease

## 2022-12-06 ENCOUNTER — Ambulatory Visit (HOSPITAL_BASED_OUTPATIENT_CLINIC_OR_DEPARTMENT_OTHER): Payer: Medicaid Other | Admitting: Pulmonary Disease

## 2022-12-06 DIAGNOSIS — C3490 Malignant neoplasm of unspecified part of unspecified bronchus or lung: Secondary | ICD-10-CM

## 2022-12-06 NOTE — Progress Notes (Signed)
[facility] CSW Progress Note  Holiday representative  received HCP documents from pt's sister Maxwell Grant).  Maxwell Grant is listed as pt's HCP.  Document forwarded to HIM to scan into pt's chart.  CSW to remain available throughout duration of treatment to provide support as appropriate.        [name], LCSW    Patient is participating in a Managed Medicaid Plan:  Yes

## 2022-12-07 ENCOUNTER — Ambulatory Visit: Payer: Medicaid Other

## 2022-12-07 NOTE — Telephone Encounter (Signed)
This has been handled in another encounter. Close this encounter

## 2022-12-08 ENCOUNTER — Ambulatory Visit: Payer: Medicaid Other

## 2022-12-08 NOTE — Progress Notes (Unsigned)
Maxwell [facility]  [phone] [phone]   Name: Maxwell Grant Date: 12/08/2022 MRN: [id number]  DOB: Jul 05, 1966  Patient Care Team: Bernerd Limbo, MD as PCP - General (Family Medicine)    INTERVAL HISTORY: Maxwell Grant is a 57 y.o. male with  oncologic medical history including new diagnosed non-small cell lung cancer (10/2022), as well as a history of COPD, diabetes mellitus, hypertension, dyslipidemia as well as history of osteomyelitis of the back.  Palliative ask to see for symptom and pain management and goals of care.     SOCIAL HISTORY:     reports that he has been smoking cigarettes. He has been smoking an average of 2 packs per day. He has never used smokeless tobacco. He reports current alcohol use of about 1.0 standard drink of alcohol per week. He reports current drug use. Drug: Marijuana.  ADVANCE DIRECTIVES:    CODE STATUS:   PAST MEDICAL HISTORY: Past Medical History:  Diagnosis Date   CHF (congestive heart failure) (HCC)    COPD (chronic obstructive pulmonary disease) (HCC)    Diabetes mellitus without complication (HCC)    Dyspnea    Hypertension     ALLERGIES:  is allergic to statins, aspirin, flexeril [cyclobenzaprine], and penicillins.  MEDICATIONS:  Current Outpatient Medications  Medication Sig Dispense Refill   Accu-Chek FastClix Lancets MISC Use to test blood sugar 4 times a day 102 each 0   albuterol (VENTOLIN HFA) 108 (90 Base) MCG/ACT inhaler Inhale 1-2 puffs into the lungs every 4 (four) hours as needed for wheezing or shortness of breath.     folic acid (FOLVITE) 1 MG tablet Take 1 tablet (1 mg total) by mouth daily. 30 tablet 4   furosemide (LASIX) 20 MG tablet Take 1 tablet (20 mg total) by mouth once as needed for up to 1 dose. As long as your systolic blood pressure is >120 7 tablet 0   glimepiride (AMARYL) 2 MG tablet Take 2 mg by mouth in the morning.     lidocaine-prilocaine  (EMLA) cream Apply to [facility] Prior to Access as needed 30 g 2   LORazepam (ATIVAN) 0.5 MG tablet Take 1 tablet (0.5 mg total) by mouth daily as needed for anxiety. Take one tablet 30 min prior to PET scan 4 tablet 0   methocarbamol (ROBAXIN) 500 MG tablet Take 1 tablet (500 mg total) by mouth every 12 (twelve) hours as needed for muscle spasms. 10 tablet 0   oxyCODONE (OXYCONTIN) 20 mg 12 hr tablet Take 1 tablet (20 mg total) by mouth every 12 (twelve) hours. 60 tablet 0   oxyCODONE-acetaminophen (PERCOCET) 7.5-325 MG tablet Take 1 tablet by mouth every 6 hours as needed for severe pain. 60 tablet 0   prochlorperazine (COMPAZINE) 10 MG tablet Take 1 tablet (10 mg total) by mouth every 6 (six) hours as needed for nausea or vomiting. 30 tablet 0   Tiotropium Bromide Monohydrate (SPIRIVA RESPIMAT) 2.5 MCG/ACT AERS Inhale 2 puffs into the lungs daily. 4 g 5   No current facility-administered medications for this visit.    VITAL SIGNS: There were no vitals taken for this visit. There were no vitals filed for this visit.  Estimated body mass index is 26.56 kg/m as calculated from the following:   Height as of 11/22/22: 5\' 10"  (1.778 m).   Weight as of 12/05/22: 185 lb 2 oz (84 kg).   PERFORMANCE STATUS (ECOG) : 2 - Symptomatic, <50% confined to bed  Physical Exam General: NAD, in wheelchair Cardiovascular: regular rate and rhythm Pulmonary: normal breathing pattern  Abdomen: soft, nontender, + bowel sounds Extremities: bilateral lower extremity pitting edema, no joint deformities Skin: no rashes Neurological: AAO x4, drowsy   IMPRESSION: Maxwell Grant presents to clinic today for symptom management follow-up. No acute distress noted. His sister is present with him. States overall things have been going ok. Some left hip pain with movement.  Denies nausea, vomiting, constipation, or diarrhea.  Appetite is good.   Neoplasm related pain Mr. Midgley reports his pain has improved. He is  tolerating lying down a little better than before. He is appreciative of this. His pain is well controlled control on current regimen.  He is complaining of some left hip pain.  We discussed with weather changes and mobility increasing this may be more arthritic.  Education provided on the use of Mobic daily as needed for arthritic pain.  Frequency and potential side effects discussed.  We discussed at length current regimen. He is taking Oxycodone 20 mg every 12 hours, Percocet 7.5mg  every 6 hours as needed. He is taking medications appropriately. He is not requiring breakthrough medication around the clock.   We will continue to closely monitor and adjust medications as needed.    2.  Constipation Improved with daily regimen of Miralax and Senna-S    I discussed the importance of continued conversation with family and their medical providers regarding overall plan of care and treatment options, ensuring decisions are within the context of the patients values and GOCs.  PLAN:  OxyContin 20 mg every 12 hours  Oxycodone 7.5/325 -15mg  every 6 hours as needed for breakthrough pain. Not requiring around the clock.  MiraLAX twice daily Colace daily Mobic 7.5 mg daily Education on nutrition. Ongoing symptom management support and goals of care discussions. I will plan to see patient back in 2-4 weeks in collaboration to other oncology appointments.  Knows to contact office sooner if needed.   Patient expressed understanding and was in agreement with this plan. He also understands that He can call the clinic at any time with any questions, concerns, or complaints.      Any controlled substances utilized were prescribed in the context of palliative care. PDMP has been reviewed.    Time Total: 30 min   Visit consisted of counseling and education dealing with the complex and emotionally intense issues of symptom management and palliative care in the setting of serious and potentially  life-threatening illness.Greater than 50%  of this time was spent counseling and coordinating care related to the above assessment and plan.  Alda Lea, AGPCNP-BC  Palliative Medicine Team/[facility]

## 2022-12-11 ENCOUNTER — Ambulatory Visit (HOSPITAL_BASED_OUTPATIENT_CLINIC_OR_DEPARTMENT_OTHER): Payer: Medicaid Other | Admitting: Pulmonary Disease

## 2022-12-12 ENCOUNTER — Inpatient Hospital Stay: Payer: Medicaid Other

## 2022-12-12 ENCOUNTER — Other Ambulatory Visit (HOSPITAL_COMMUNITY): Payer: Self-pay

## 2022-12-12 ENCOUNTER — Inpatient Hospital Stay (HOSPITAL_BASED_OUTPATIENT_CLINIC_OR_DEPARTMENT_OTHER): Payer: Medicaid Other | Admitting: Nurse Practitioner

## 2022-12-12 ENCOUNTER — Encounter: Payer: Self-pay | Admitting: Nurse Practitioner

## 2022-12-12 VITALS — BP 121/73 | HR 86 | Temp 98.3°F | Resp 16

## 2022-12-12 DIAGNOSIS — C3490 Malignant neoplasm of unspecified part of unspecified bronchus or lung: Secondary | ICD-10-CM

## 2022-12-12 DIAGNOSIS — M25552 Pain in left hip: Secondary | ICD-10-CM

## 2022-12-12 DIAGNOSIS — K5900 Constipation, unspecified: Secondary | ICD-10-CM

## 2022-12-12 DIAGNOSIS — Z515 Encounter for palliative care: Secondary | ICD-10-CM | POA: Diagnosis not present

## 2022-12-12 DIAGNOSIS — Z5112 Encounter for antineoplastic immunotherapy: Secondary | ICD-10-CM | POA: Diagnosis not present

## 2022-12-12 LAB — CBC WITH DIFFERENTIAL/PLATELET
Abs Immature Granulocytes: 0.01 10*3/uL (ref 0.00–0.07)
Basophils Absolute: 0 10*3/uL (ref 0.0–0.1)
Basophils Relative: 1 %
Eosinophils Absolute: 0.3 10*3/uL (ref 0.0–0.5)
Eosinophils Relative: 9 %
HCT: 32.9 % — ABNORMAL LOW (ref 39.0–52.0)
Hemoglobin: 10.5 g/dL — ABNORMAL LOW (ref 13.0–17.0)
Immature Granulocytes: 0 %
Lymphocytes Relative: 35 %
Lymphs Abs: 1 10*3/uL (ref 0.7–4.0)
MCH: 27.9 pg (ref 26.0–34.0)
MCHC: 31.9 g/dL (ref 30.0–36.0)
MCV: 87.5 fL (ref 80.0–100.0)
Monocytes Absolute: 0.2 10*3/uL (ref 0.1–1.0)
Monocytes Relative: 7 %
Neutro Abs: 1.4 10*3/uL — ABNORMAL LOW (ref 1.7–7.7)
Neutrophils Relative %: 48 %
Platelets: 158 10*3/uL (ref 150–400)
RBC: 3.76 MIL/uL — ABNORMAL LOW (ref 4.22–5.81)
RDW: 17.6 % — ABNORMAL HIGH (ref 11.5–15.5)
Smear Review: NORMAL
WBC: 2.9 10*3/uL — ABNORMAL LOW (ref 4.0–10.5)
nRBC: 0 % (ref 0.0–0.2)

## 2022-12-12 LAB — COMPREHENSIVE METABOLIC PANEL
ALT: 17 U/L (ref 0–44)
AST: 13 U/L — ABNORMAL LOW (ref 15–41)
Albumin: 3.7 g/dL (ref 3.5–5.0)
Alkaline Phosphatase: 110 U/L (ref 38–126)
Anion gap: 6 (ref 5–15)
BUN: 17 mg/dL (ref 6–20)
CO2: 31 mmol/L (ref 22–32)
Calcium: 8.8 mg/dL — ABNORMAL LOW (ref 8.9–10.3)
Chloride: 100 mmol/L (ref 98–111)
Creatinine, Ser: 0.78 mg/dL (ref 0.61–1.24)
GFR, Estimated: >60 mL/min (ref >60)
Glucose, Bld: 71 mg/dL (ref 70–99)
Potassium: 4 mmol/L (ref 3.5–5.1)
Sodium: 137 mmol/L (ref 135–145)
Total Bilirubin: 0.5 mg/dL (ref 0.3–1.2)
Total Protein: 6.5 g/dL (ref 6.5–8.1)

## 2022-12-12 MED ORDER — MELOXICAM 7.5 MG PO TABS
7.5000 mg | ORAL_TABLET | Freq: Every day | ORAL | 0 refills | Status: DC
  Filled 2022-12-12: qty 30, 30d supply, fill #0

## 2022-12-12 MED ORDER — HEPARIN SOD (PORK) LOCK FLUSH 100 UNIT/ML IV SOLN
500.0000 [IU] | Freq: Once | INTRAVENOUS | Status: AC | PRN
  Administered 2022-12-12: 500 [IU]

## 2022-12-12 MED ORDER — SODIUM CHLORIDE 0.9% FLUSH
10.0000 mL | Freq: Once | INTRAVENOUS | Status: AC | PRN
  Administered 2022-12-12: 10 mL

## 2022-12-13 ENCOUNTER — Encounter: Payer: Self-pay | Admitting: Internal Medicine

## 2022-12-15 ENCOUNTER — Other Ambulatory Visit: Payer: Self-pay | Admitting: Nurse Practitioner

## 2022-12-15 ENCOUNTER — Other Ambulatory Visit (HOSPITAL_COMMUNITY): Payer: Self-pay

## 2022-12-15 DIAGNOSIS — G893 Neoplasm related pain (acute) (chronic): Secondary | ICD-10-CM

## 2022-12-15 DIAGNOSIS — C3490 Malignant neoplasm of unspecified part of unspecified bronchus or lung: Secondary | ICD-10-CM

## 2022-12-15 DIAGNOSIS — Z515 Encounter for palliative care: Secondary | ICD-10-CM

## 2022-12-15 MED ORDER — OXYCODONE-ACETAMINOPHEN 7.5-325 MG PO TABS
1.0000 | ORAL_TABLET | Freq: Four times a day (QID) | ORAL | 0 refills | Status: DC | PRN
  Filled 2022-12-15 – 2022-12-18 (×3): qty 60, 15d supply, fill #0

## 2022-12-16 ENCOUNTER — Other Ambulatory Visit (HOSPITAL_COMMUNITY): Payer: Self-pay

## 2022-12-18 ENCOUNTER — Other Ambulatory Visit: Payer: Self-pay

## 2022-12-18 ENCOUNTER — Other Ambulatory Visit (HOSPITAL_COMMUNITY): Payer: Self-pay

## 2022-12-19 ENCOUNTER — Inpatient Hospital Stay: Payer: Medicaid Other | Attending: Internal Medicine

## 2022-12-19 DIAGNOSIS — Z86718 Personal history of other venous thrombosis and embolism: Secondary | ICD-10-CM | POA: Insufficient documentation

## 2022-12-19 DIAGNOSIS — Z7901 Long term (current) use of anticoagulants: Secondary | ICD-10-CM | POA: Insufficient documentation

## 2022-12-19 DIAGNOSIS — Z880 Allergy status to penicillin: Secondary | ICD-10-CM | POA: Insufficient documentation

## 2022-12-19 DIAGNOSIS — Z886 Allergy status to analgesic agent status: Secondary | ICD-10-CM | POA: Insufficient documentation

## 2022-12-19 DIAGNOSIS — C3411 Malignant neoplasm of upper lobe, right bronchus or lung: Secondary | ICD-10-CM | POA: Diagnosis present

## 2022-12-19 DIAGNOSIS — Z79899 Other long term (current) drug therapy: Secondary | ICD-10-CM | POA: Diagnosis not present

## 2022-12-19 DIAGNOSIS — E119 Type 2 diabetes mellitus without complications: Secondary | ICD-10-CM | POA: Diagnosis not present

## 2022-12-19 DIAGNOSIS — Z888 Allergy status to other drugs, medicaments and biological substances status: Secondary | ICD-10-CM | POA: Insufficient documentation

## 2022-12-19 DIAGNOSIS — Z79630 Long term (current) use of alkylating agent: Secondary | ICD-10-CM | POA: Insufficient documentation

## 2022-12-19 DIAGNOSIS — Z79633 Long term (current) use of mitotic inhibitor: Secondary | ICD-10-CM | POA: Insufficient documentation

## 2022-12-19 DIAGNOSIS — Z79631 Long term (current) use of antimetabolite agent: Secondary | ICD-10-CM | POA: Insufficient documentation

## 2022-12-19 DIAGNOSIS — Z79620 Long term (current) use of immunosuppressive biologic: Secondary | ICD-10-CM | POA: Diagnosis not present

## 2022-12-19 DIAGNOSIS — M898X9 Other specified disorders of bone, unspecified site: Secondary | ICD-10-CM | POA: Insufficient documentation

## 2022-12-19 DIAGNOSIS — R0609 Other forms of dyspnea: Secondary | ICD-10-CM | POA: Insufficient documentation

## 2022-12-19 DIAGNOSIS — I82401 Acute embolism and thrombosis of unspecified deep veins of right lower extremity: Secondary | ICD-10-CM | POA: Insufficient documentation

## 2022-12-19 DIAGNOSIS — M25552 Pain in left hip: Secondary | ICD-10-CM | POA: Insufficient documentation

## 2022-12-19 DIAGNOSIS — Z5111 Encounter for antineoplastic chemotherapy: Secondary | ICD-10-CM | POA: Diagnosis present

## 2022-12-19 DIAGNOSIS — E785 Hyperlipidemia, unspecified: Secondary | ICD-10-CM | POA: Insufficient documentation

## 2022-12-19 DIAGNOSIS — J439 Emphysema, unspecified: Secondary | ICD-10-CM | POA: Insufficient documentation

## 2022-12-19 DIAGNOSIS — C3490 Malignant neoplasm of unspecified part of unspecified bronchus or lung: Secondary | ICD-10-CM

## 2022-12-19 DIAGNOSIS — Z5112 Encounter for antineoplastic immunotherapy: Secondary | ICD-10-CM | POA: Diagnosis present

## 2022-12-19 DIAGNOSIS — C7951 Secondary malignant neoplasm of bone: Secondary | ICD-10-CM | POA: Insufficient documentation

## 2022-12-19 DIAGNOSIS — R5383 Other fatigue: Secondary | ICD-10-CM | POA: Diagnosis not present

## 2022-12-19 DIAGNOSIS — I700 Atherosclerosis of aorta: Secondary | ICD-10-CM | POA: Diagnosis not present

## 2022-12-19 DIAGNOSIS — Z95828 Presence of other vascular implants and grafts: Secondary | ICD-10-CM

## 2022-12-19 DIAGNOSIS — F17210 Nicotine dependence, cigarettes, uncomplicated: Secondary | ICD-10-CM | POA: Diagnosis not present

## 2022-12-19 DIAGNOSIS — I082 Rheumatic disorders of both aortic and tricuspid valves: Secondary | ICD-10-CM | POA: Insufficient documentation

## 2022-12-19 DIAGNOSIS — M549 Dorsalgia, unspecified: Secondary | ICD-10-CM | POA: Insufficient documentation

## 2022-12-19 LAB — CBC WITH DIFFERENTIAL/PLATELET
Abs Immature Granulocytes: 0.01 10*3/uL (ref 0.00–0.07)
Basophils Absolute: 0 10*3/uL (ref 0.0–0.1)
Basophils Relative: 0 %
Eosinophils Absolute: 0.2 10*3/uL (ref 0.0–0.5)
Eosinophils Relative: 6 %
HCT: 32.7 % — ABNORMAL LOW (ref 39.0–52.0)
Hemoglobin: 10.4 g/dL — ABNORMAL LOW (ref 13.0–17.0)
Immature Granulocytes: 0 %
Lymphocytes Relative: 25 %
Lymphs Abs: 1 10*3/uL (ref 0.7–4.0)
MCH: 28.3 pg (ref 26.0–34.0)
MCHC: 31.8 g/dL (ref 30.0–36.0)
MCV: 88.9 fL (ref 80.0–100.0)
Monocytes Absolute: 0.5 10*3/uL (ref 0.1–1.0)
Monocytes Relative: 13 %
Neutro Abs: 2.3 10*3/uL (ref 1.7–7.7)
Neutrophils Relative %: 56 %
Platelets: 111 10*3/uL — ABNORMAL LOW (ref 150–400)
RBC: 3.68 MIL/uL — ABNORMAL LOW (ref 4.22–5.81)
RDW: 19.4 % — ABNORMAL HIGH (ref 11.5–15.5)
Smear Review: NORMAL
WBC: 4.1 10*3/uL (ref 4.0–10.5)
nRBC: 0 % (ref 0.0–0.2)

## 2022-12-19 LAB — COMPREHENSIVE METABOLIC PANEL
ALT: 13 U/L (ref 0–44)
AST: 8 U/L — ABNORMAL LOW (ref 15–41)
Albumin: 3.6 g/dL (ref 3.5–5.0)
Alkaline Phosphatase: 146 U/L — ABNORMAL HIGH (ref 38–126)
Anion gap: 6 (ref 5–15)
BUN: 10 mg/dL (ref 6–20)
CO2: 30 mmol/L (ref 22–32)
Calcium: 8.9 mg/dL (ref 8.9–10.3)
Chloride: 102 mmol/L (ref 98–111)
Creatinine, Ser: 0.7 mg/dL (ref 0.61–1.24)
GFR, Estimated: >60 mL/min (ref >60)
Glucose, Bld: 95 mg/dL (ref 70–99)
Potassium: 3.8 mmol/L (ref 3.5–5.1)
Sodium: 138 mmol/L (ref 135–145)
Total Bilirubin: 0.6 mg/dL (ref 0.3–1.2)
Total Protein: 6.5 g/dL (ref 6.5–8.1)

## 2022-12-19 MED ORDER — SODIUM CHLORIDE 0.9% FLUSH
10.0000 mL | INTRAVENOUS | Status: AC | PRN
  Administered 2022-12-19: 10 mL

## 2022-12-19 MED ORDER — HEPARIN SOD (PORK) LOCK FLUSH 100 UNIT/ML IV SOLN
500.0000 [IU] | INTRAVENOUS | Status: AC | PRN
  Administered 2022-12-19: 500 [IU]

## 2022-12-20 ENCOUNTER — Encounter (HOSPITAL_BASED_OUTPATIENT_CLINIC_OR_DEPARTMENT_OTHER): Payer: Self-pay | Admitting: Pulmonary Disease

## 2022-12-21 ENCOUNTER — Ambulatory Visit (INDEPENDENT_AMBULATORY_CARE_PROVIDER_SITE_OTHER): Payer: Medicaid Other

## 2022-12-21 ENCOUNTER — Ambulatory Visit (HOSPITAL_BASED_OUTPATIENT_CLINIC_OR_DEPARTMENT_OTHER)
Admission: RE | Admit: 2022-12-21 | Discharge: 2022-12-21 | Disposition: A | Discharge status: Home / Self Care | Payer: Medicaid Other | Source: Ambulatory Visit | Attending: Pulmonary Disease | Admitting: Pulmonary Disease

## 2022-12-21 DIAGNOSIS — I82401 Acute embolism and thrombosis of unspecified deep veins of right lower extremity: Secondary | ICD-10-CM

## 2022-12-21 DIAGNOSIS — I50812 Chronic right heart failure: Secondary | ICD-10-CM

## 2022-12-21 DIAGNOSIS — M7989 Other specified soft tissue disorders: Secondary | ICD-10-CM | POA: Diagnosis present

## 2022-12-21 HISTORY — DX: Acute embolism and thrombosis of unspecified deep veins of right lower extremity: I82.401

## 2022-12-21 LAB — ECHOCARDIOGRAM COMPLETE
Area-P 1/2: 3.34 cm2
Calc EF: 61.4 %
S' Lateral: 3.51 cm
Single Plane A2C EF: 67.5 %
Single Plane A4C EF: 54.1 %

## 2022-12-25 ENCOUNTER — Encounter: Payer: Self-pay | Admitting: Internal Medicine

## 2022-12-25 ENCOUNTER — Other Ambulatory Visit (HOSPITAL_COMMUNITY): Payer: Self-pay

## 2022-12-25 ENCOUNTER — Telehealth: Payer: Self-pay | Admitting: Pulmonary Disease

## 2022-12-25 MED ORDER — APIXABAN (ELIQUIS) VTE STARTER PACK (10MG AND 5MG)
ORAL_TABLET | ORAL | 0 refills | Status: DC
  Filled 2022-12-25: qty 74, 30d supply, fill #0

## 2022-12-25 MED FILL — Fosaprepitant Dimeglumine For IV Infusion 150 MG (Base Eq): INTRAVENOUS | Qty: 5 | Status: AC

## 2022-12-25 MED FILL — Dexamethasone Sodium Phosphate Inj 100 MG/10ML: INTRAMUSCULAR | Qty: 1 | Status: AC

## 2022-12-25 NOTE — Telephone Encounter (Signed)
Echo 12/21/22 >> EF 55%, mild RV enlargement Doppler legs b/l 12/21/22 >> Rt posterior tibial and peroneal vein DVT      Latest Ref Rng & Units 12/19/2022    2:04 PM 12/12/2022    1:34 PM 12/05/2022    8:21 AM  CMP  Glucose 70 - 99 mg/dL 95  71  837   BUN 6 - 20 mg/dL 10  17  14    Creatinine 0.61 - 1.24 mg/dL 2.90  2.11  1.55   Sodium 135 - 145 mmol/L 138  137  139   Potassium 3.5 - 5.1 mmol/L 3.8  4.0  4.1   Chloride 98 - 111 mmol/L 102  100  100   CO2 22 - 32 mmol/L 30  31  33   Calcium 8.9 - 10.3 mg/dL 8.9  8.8  9.0   Total Protein 6.5 - 8.1 g/dL 6.5  6.5  6.7   Total Bilirubin 0.3 - 1.2 mg/dL 0.6  0.5  0.6   Alkaline Phos 38 - 126 U/L 146  110  95   AST 15 - 41 U/L 8  13  11    ALT 0 - 44 U/L 13  17  16         Latest Ref Rng & Units 12/19/2022    2:04 PM 12/12/2022    1:34 PM 12/05/2022    8:21 AM  CBC  WBC 4.0 - 10.5 K/uL 4.1  2.9  7.9   Hemoglobin 13.0 - 17.0 g/dL 20.8  02.2  33.6   Hematocrit 39.0 - 52.0 % 32.7  32.9  38.4   Platelets 150 - 400 K/uL 111  158  281      Results d/w pt's sister.    Will start eliquis 10 mg bid for 7 days, and then transition to eliquis 5 mg bid.  Advised that he should continue spiriva, but can stop symbicort.  Continue prn albuterol.

## 2022-12-26 ENCOUNTER — Encounter: Payer: Self-pay | Admitting: Medical Oncology

## 2022-12-26 ENCOUNTER — Inpatient Hospital Stay (HOSPITAL_BASED_OUTPATIENT_CLINIC_OR_DEPARTMENT_OTHER): Payer: Medicaid Other

## 2022-12-26 ENCOUNTER — Encounter: Payer: Self-pay | Admitting: Internal Medicine

## 2022-12-26 ENCOUNTER — Encounter (HOSPITAL_BASED_OUTPATIENT_CLINIC_OR_DEPARTMENT_OTHER): Payer: Self-pay | Admitting: Pulmonary Disease

## 2022-12-26 ENCOUNTER — Inpatient Hospital Stay: Payer: Medicaid Other

## 2022-12-26 ENCOUNTER — Inpatient Hospital Stay (HOSPITAL_BASED_OUTPATIENT_CLINIC_OR_DEPARTMENT_OTHER): Payer: Medicaid Other | Admitting: Internal Medicine

## 2022-12-26 ENCOUNTER — Other Ambulatory Visit (HOSPITAL_COMMUNITY): Payer: Self-pay

## 2022-12-26 VITALS — BP 144/87 | HR 72 | Temp 97.9°F | Resp 18

## 2022-12-26 DIAGNOSIS — C3490 Malignant neoplasm of unspecified part of unspecified bronchus or lung: Secondary | ICD-10-CM | POA: Diagnosis not present

## 2022-12-26 DIAGNOSIS — Z95828 Presence of other vascular implants and grafts: Secondary | ICD-10-CM | POA: Insufficient documentation

## 2022-12-26 DIAGNOSIS — Z5112 Encounter for antineoplastic immunotherapy: Secondary | ICD-10-CM | POA: Diagnosis not present

## 2022-12-26 LAB — CBC WITH DIFFERENTIAL (CANCER CENTER ONLY)
Abs Immature Granulocytes: 0.01 10*3/uL (ref 0.00–0.07)
Basophils Absolute: 0 10*3/uL (ref 0.0–0.1)
Basophils Relative: 1 %
Eosinophils Absolute: 0.2 10*3/uL (ref 0.0–0.5)
Eosinophils Relative: 5 %
HCT: 33 % — ABNORMAL LOW (ref 39.0–52.0)
Hemoglobin: 10.5 g/dL — ABNORMAL LOW (ref 13.0–17.0)
Immature Granulocytes: 0 %
Lymphocytes Relative: 26 %
Lymphs Abs: 0.9 10*3/uL (ref 0.7–4.0)
MCH: 29.3 pg (ref 26.0–34.0)
MCHC: 31.8 g/dL (ref 30.0–36.0)
MCV: 92.2 fL (ref 80.0–100.0)
Monocytes Absolute: 0.4 10*3/uL (ref 0.1–1.0)
Monocytes Relative: 11 %
Neutro Abs: 1.9 10*3/uL (ref 1.7–7.7)
Neutrophils Relative %: 57 %
Platelet Count: 190 10*3/uL (ref 150–400)
RBC: 3.58 MIL/uL — ABNORMAL LOW (ref 4.22–5.81)
RDW: 21.6 % — ABNORMAL HIGH (ref 11.5–15.5)
WBC Count: 3.3 10*3/uL — ABNORMAL LOW (ref 4.0–10.5)
nRBC: 0 % (ref 0.0–0.2)

## 2022-12-26 LAB — CMP (CANCER CENTER ONLY)
ALT: 8 U/L (ref 0–44)
AST: 9 U/L — ABNORMAL LOW (ref 15–41)
Albumin: 3.6 g/dL (ref 3.5–5.0)
Alkaline Phosphatase: 144 U/L — ABNORMAL HIGH (ref 38–126)
Anion gap: 4 — ABNORMAL LOW (ref 5–15)
BUN: 14 mg/dL (ref 6–20)
CO2: 32 mmol/L (ref 22–32)
Calcium: 9.1 mg/dL (ref 8.9–10.3)
Chloride: 106 mmol/L (ref 98–111)
Creatinine: 0.89 mg/dL (ref 0.61–1.24)
GFR, Estimated: >60 mL/min (ref >60)
Glucose, Bld: 61 mg/dL — ABNORMAL LOW (ref 70–99)
Potassium: 3.8 mmol/L (ref 3.5–5.1)
Sodium: 142 mmol/L (ref 135–145)
Total Bilirubin: 0.6 mg/dL (ref 0.3–1.2)
Total Protein: 6.5 g/dL (ref 6.5–8.1)

## 2022-12-26 LAB — TSH: TSH: 1.081 u[IU]/mL (ref 0.350–4.500)

## 2022-12-26 MED ORDER — HEPARIN SOD (PORK) LOCK FLUSH 100 UNIT/ML IV SOLN
500.0000 [IU] | Freq: Once | INTRAVENOUS | Status: AC | PRN
  Administered 2022-12-26: 500 [IU]

## 2022-12-26 MED ORDER — ALBUTEROL SULFATE HFA 108 (90 BASE) MCG/ACT IN AERS
1.0000 | INHALATION_SPRAY | Freq: Four times a day (QID) | RESPIRATORY_TRACT | 3 refills | Status: DC | PRN
  Filled 2022-12-26: qty 18, 25d supply, fill #0
  Filled 2023-02-09: qty 18, 25d supply, fill #1
  Filled 2023-04-17: qty 18, 25d supply, fill #2
  Filled 2023-06-12: qty 18, 25d supply, fill #3
  Filled ????-??-??: fill #1

## 2022-12-26 MED ORDER — SODIUM CHLORIDE 0.9 % IV SOLN
683.5000 mg | Freq: Once | INTRAVENOUS | Status: AC
  Administered 2022-12-26: 680 mg via INTRAVENOUS
  Filled 2022-12-26: qty 68

## 2022-12-26 MED ORDER — SODIUM CHLORIDE 0.9 % IV SOLN
200.0000 mg | Freq: Once | INTRAVENOUS | Status: AC
  Administered 2022-12-26: 200 mg via INTRAVENOUS
  Filled 2022-12-26: qty 200

## 2022-12-26 MED ORDER — SODIUM CHLORIDE 0.9 % IV SOLN: Freq: Once | INTRAVENOUS | Status: AC

## 2022-12-26 MED ORDER — SODIUM CHLORIDE 0.9 % IV SOLN
500.0000 mg/m2 | Freq: Once | INTRAVENOUS | Status: AC
  Administered 2022-12-26: 1000 mg via INTRAVENOUS
  Filled 2022-12-26: qty 40

## 2022-12-26 MED ORDER — SODIUM CHLORIDE 0.9 % IV SOLN
10.0000 mg | Freq: Once | INTRAVENOUS | Status: AC
  Administered 2022-12-26: 10 mg via INTRAVENOUS
  Filled 2022-12-26: qty 10

## 2022-12-26 MED ORDER — CYANOCOBALAMIN 1000 MCG/ML IJ SOLN
1000.0000 ug | Freq: Once | INTRAMUSCULAR | Status: AC
  Administered 2022-12-26: 1000 ug via INTRAMUSCULAR
  Filled 2022-12-26: qty 1

## 2022-12-26 MED ORDER — PALONOSETRON HCL INJECTION 0.25 MG/5ML
0.2500 mg | Freq: Once | INTRAVENOUS | Status: AC
  Administered 2022-12-26: 0.25 mg via INTRAVENOUS
  Filled 2022-12-26: qty 5

## 2022-12-26 MED ORDER — SODIUM CHLORIDE 0.9% FLUSH
10.0000 mL | INTRAVENOUS | Status: DC | PRN
  Administered 2022-12-26: 10 mL

## 2022-12-26 MED ORDER — SODIUM CHLORIDE 0.9% FLUSH
10.0000 mL | Freq: Once | INTRAVENOUS | Status: AC
  Administered 2022-12-26: 10 mL

## 2022-12-26 MED ORDER — SODIUM CHLORIDE 0.9 % IV SOLN
150.0000 mg | Freq: Once | INTRAVENOUS | Status: AC
  Administered 2022-12-26: 150 mg via INTRAVENOUS
  Filled 2022-12-26: qty 150

## 2022-12-26 NOTE — Patient Instructions (Signed)
[facility]  Discharge Instructions: Thank you for choosing [facility] to provide your oncology and hematology care.   If you have a lab appointment with the Cancer Center, please go directly to the Cancer Center and check in at the registration area.   Wear comfortable clothing and clothing appropriate for easy access to any Portacath or PICC line.   We strive to give you quality time with your provider. You may need to reschedule your appointment if you arrive late (15 or more minutes).  Arriving late affects you and other patients whose appointments are after yours.  Also, if you miss three or more appointments without notifying the office, you may be dismissed from the clinic at the provider's discretion.      For prescription refill requests, have your pharmacy contact our office and allow 72 hours for refills to be completed.    Today you received the following chemotherapy and/or immunotherapy agents: Keytruda, Alimta, Paraplatin      To help prevent nausea and vomiting after your treatment, we encourage you to take your nausea medication as directed.  BELOW ARE SYMPTOMS THAT SHOULD BE REPORTED IMMEDIATELY: *FEVER GREATER THAN 100.4 F (38 C) OR HIGHER *CHILLS OR SWEATING *NAUSEA AND VOMITING THAT IS NOT CONTROLLED WITH YOUR NAUSEA MEDICATION *UNUSUAL SHORTNESS OF BREATH *UNUSUAL BRUISING OR BLEEDING *URINARY PROBLEMS (pain or burning when urinating, or frequent urination) *BOWEL PROBLEMS (unusual diarrhea, constipation, pain near the anus) TENDERNESS IN MOUTH AND THROAT WITH OR WITHOUT PRESENCE OF ULCERS (sore throat, sores in mouth, or a toothache) UNUSUAL RASH, SWELLING OR PAIN  UNUSUAL VAGINAL DISCHARGE OR ITCHING   Items with * indicate a potential emergency and should be followed up as soon as possible or go to the Emergency Department if any problems should occur.  Please show the CHEMOTHERAPY ALERT CARD or IMMUNOTHERAPY  ALERT CARD at check-in to the Emergency Department and triage nurse.  Should you have questions after your visit or need to cancel or reschedule your appointment, please contact [facility]  Dept: [phone]  and follow the prompts.  Office hours are 8:00 a.m. to 4:30 p.m. Monday - Friday. Please note that voicemails left after 4:00 p.m. may not be returned until the following business day.  We are closed weekends and major holidays. You have access to a nurse at all times for urgent questions. Please call the main number to the clinic Dept: [phone] and follow the prompts.   For any non-urgent questions, you may also contact your provider using MyChart. We now offer e-Visits for anyone 35 and older to request care online for non-urgent symptoms. For details visit [email or web address].   Also download the MyChart app! Go to the app store, search "MyChart", open the app, select , and log in with your MyChart username and password.

## 2022-12-26 NOTE — Telephone Encounter (Signed)
See previous pt email. 

## 2022-12-26 NOTE — Progress Notes (Signed)
Patient seen by Dr. Mohamed  Vitals are within treatment parameters.  Labs reviewed: and are within treatment parameters.  Per physician team, patient is ready for treatment and there are NO modifications to the treatment plan.  

## 2022-12-26 NOTE — Telephone Encounter (Signed)
Dr. Craige Cotta, please advise on message regarding pt wanting to try again with Trelegy approval stating the Symbicort and Spiriva aren't working after 3 weeks. Also, please advise if ok to send in albuterol hfa (this has not been filled by pulmonary before, per pts chart).   Thanks!

## 2022-12-26 NOTE — Telephone Encounter (Signed)
Okay to send order to see if he can get approval for trelegy 100 one puff daily since has tried spiriva and symbicort and these haven't worked.

## 2022-12-26 NOTE — Progress Notes (Signed)
CBG 82 at 1048.

## 2022-12-26 NOTE — Patient Instructions (Signed)
Steps to Quit Smoking Smoking tobacco is the leading cause of preventable death. It can affect almost every organ in the body. Smoking puts you and people around you at risk for many serious, long-lasting (chronic) diseases. Quitting smoking can be hard, but it is one of the best things that you can do for your health. It is never too late to quit. Do not give up if you cannot quit the first time. Some people need to try many times to quit. Do your best to stick to your quit plan, and talk with your doctor if you have any questions or concerns. How do I get ready to quit? Pick a date to quit. Set a date within the next 2 weeks to give you time to prepare. Write down the reasons why you are quitting. Keep this list in places where you will see it often. Tell your family, friends, and co-workers that you are quitting. Their support is important. Talk with your doctor about the choices that may help you quit. Find out if your health insurance will pay for these treatments. Know the people, places, things, and activities that make you want to smoke (triggers). Avoid them. What first steps can I take to quit smoking? Throw away all cigarettes at home, at work, and in your car. Throw away the things that you use when you smoke, such as ashtrays and lighters. Clean your car. Empty the ashtray. Clean your home, including curtains and carpets. What can I do to help me quit smoking? Talk with your doctor about taking medicines and seeing a counselor. You are more likely to succeed when you do both. If you are pregnant or breastfeeding: Talk with your doctor about counseling or other ways to quit smoking. Do not take medicine to help you quit smoking unless your doctor tells you to. Quit right away Quit smoking completely, instead of slowly cutting back on how much you smoke over a period of time. Stopping smoking right away may be more successful than slowly quitting. Go to counseling. In-person is best  if this is an option. You are more likely to quit if you go to counseling sessions regularly. Take medicine You may take medicines to help you quit. Some medicines need a prescription, and some you can buy over-the-counter. Some medicines may contain a drug called nicotine to replace the nicotine in cigarettes. Medicines may: Help you stop having the desire to smoke (cravings). Help to stop the problems that come when you stop smoking (withdrawal symptoms). Your doctor may ask you to use: Nicotine patches, gum, or lozenges. Nicotine inhalers or sprays. Non-nicotine medicine that you take by mouth. Find resources Find resources and other ways to help you quit smoking and remain smoke-free after you quit. They include: Online chats with a counselor. Phone quitlines. Printed self-help materials. Support groups or group counseling. Text messaging programs. Mobile phone apps. Use apps on your mobile phone or tablet that can help you stick to your quit plan. Examples of free services include Quit Guide from the CDC and smokefree.gov  What can I do to make it easier to quit?  Talk to your family and friends. Ask them to support and encourage you. Call a phone quitline, such as 1-800-QUIT-NOW, reach out to support groups, or work with a counselor. Ask people who smoke to not smoke around you. Avoid places that make you want to smoke, such as: Bars. Parties. Smoke-break areas at work. Spend time with people who do not smoke. Lower   the stress in your life. Stress can make you want to smoke. Try these things to lower stress: Getting regular exercise. Doing deep-breathing exercises. Doing yoga. Meditating. What benefits will I see if I quit smoking? Over time, you may have: A better sense of smell and taste. Less coughing and sore throat. A slower heart rate. Lower blood pressure. Clearer skin. Better breathing. Fewer sick days. Summary Quitting smoking can be hard, but it is one of  the best things that you can do for your health. Do not give up if you cannot quit the first time. Some people need to try many times to quit. When you decide to quit smoking, make a plan to help you succeed. Quit smoking right away, not slowly over a period of time. When you start quitting, get help and support to keep you smoke-free. This information is not intended to replace advice given to you by your health care provider. Make sure you discuss any questions you have with your health care provider. Document Revised: 08/26/2021 Document Reviewed: 08/26/2021 Elsevier Patient Education  2023 Elsevier Inc.  

## 2022-12-26 NOTE — Progress Notes (Signed)
[facility] [phone]   [phone]  OFFICE PROGRESS NOTE  Tracey HarriesBouska, David, MD [address] [place] [id number]  DIAGNOSIS:  1) Stage IV (T3, N2, M1 C) non-small cell lung cancer, adenocarcinoma presented with large right upper lobe lung mass in addition to right hilar and subcarinal lymphadenopathy in addition to multiple metastatic bone lesions involving the pelvis as well as the lower thoracic and lumbar spines in addition to metastatic disease to the pelvic muscles diagnosed in February 2024.  2) deep venous thrombosis of the right lower extremity diagnosed on December 18, 2022  Detected Alteration(s) / Biomarker(s) Associated FDA-approved therapies Clinical Trial Availability% cfDNA or Amplification  KRAS G12C approved by FDA Adagrasib, Sotorasib Yes 29.3%  CDK4 Amplification None Yes High (+++)Plasma Copy Number: 8.0  PD-L1 expression 98%  PRIOR THERAPY: Palliative radiotherapy to the painful bone metastasis under the care of Dr. Mitzi HansenMoody  CURRENT THERAPY:  1) Systemic chemotherapy with carboplatin for AUC of 5, Alimta 500 Mg/M2 and Keytruda 200 Mg IV every 3 weeks.  First dose November 14, 2022.  Status post 2 cycles. 2) Eliquis 10 mg p.o. twice daily for 7 days followed by 5 mg p.o. twice daily.  First dose 12/26/2022.  INTERVAL HISTORY: Maxwell Grant 57 y.o. male returns to the clinic today for follow-up visit accompanied by his sister.  The patient is feeling fine today with no concerning complaints except for pain in the left hip area.  His back pain has significantly improved.  He was recently diagnosed with deep venous thrombosis of the right lower extremity.  He was given prescription for treatment with Eliquis by Dr. Craige CottaSood.  He has not started the treatment yet.  They have it at home and his sister is planning to give him the first dose today.  He denied having any current chest pain, shortness of breath, cough or  hemoptysis.  He has no nausea, vomiting, diarrhea or constipation.  He has no headache or visual changes.  He is here today for evaluation before starting cycle #3.  MEDICAL HISTORY: Past Medical History:  Diagnosis Date   CHF (congestive heart failure) (HCC)    COPD (chronic obstructive pulmonary disease) (HCC)    Diabetes mellitus without complication (HCC)    Dyspnea    Hypertension     ALLERGIES:  is allergic to statins, aspirin, flexeril [cyclobenzaprine], and penicillins.  MEDICATIONS:  Current Outpatient Medications  Medication Sig Dispense Refill   Accu-Chek FastClix Lancets MISC Use to test blood sugar 4 times a day 102 each 0   albuterol (VENTOLIN HFA) 108 (90 Base) MCG/ACT inhaler Inhale 1-2 puffs into the lungs every 4 (four) hours as needed for wheezing or shortness of breath.     APIXABAN (ELIQUIS) VTE STARTER PACK (10MG  AND 5MG ) Take as directed on package: start with two-5mg  tablets twice daily for 7 days. On day 8, switch to one-5mg  tablet twice daily. 74 each 0   folic acid (FOLVITE) 1 MG tablet Take 1 tablet (1 mg total) by mouth daily. 30 tablet 4   furosemide (LASIX) 20 MG tablet Take 1 tablet (20 mg total) by mouth once as needed for up to 1 dose. As long as your systolic blood pressure is >120 7 tablet 0   glimepiride (AMARYL) 2 MG tablet Take 2 mg by mouth in the morning.     lidocaine-prilocaine (EMLA) cream [facility] as needed 30 g 2  meloxicam (MOBIC) 7.5 MG tablet Take 1 tablet (7.5 mg total) by mouth daily. 30 tablet 0   oxyCODONE (OXYCONTIN) 20 mg 12 hr tablet Take 1 tablet (20 mg total) by mouth every 12 (twelve) hours. 60 tablet 0   oxyCODONE-acetaminophen (PERCOCET) 7.5-325 MG tablet Take 1 tablet by mouth every 6 hours as needed for severe pain. 60 tablet 0   prochlorperazine (COMPAZINE) 10 MG tablet Take 1 tablet (10 mg total) by mouth every 6 (six) hours as needed for nausea or vomiting. 30 tablet 0   Tiotropium Bromide Monohydrate  (SPIRIVA RESPIMAT) 2.5 MCG/ACT AERS Inhale 2 puffs into the lungs daily. 4 g 5   No current facility-administered medications for this visit.    SURGICAL HISTORY:  Past Surgical History:  Procedure Laterality Date   BRONCHIAL NEEDLE ASPIRATION BIOPSY  10/30/2022   Procedure: BRONCHIAL NEEDLE ASPIRATION BIOPSIES;  Surgeon: Leslye Peer, MD;  Location: [facility] ENDOSCOPY;  Service: Cardiopulmonary;;   IR IMAGING GUIDED PORT INSERTION  11/17/2022   NO PAST SURGERIES     RADIOLOGY WITH ANESTHESIA  10/30/2022   Procedure: MRI WITH ANESTHESIA W/WO CONTRAST;  Surgeon: Leslye Peer, MD;  Location[facility] ENDOSCOPY;  Service: Cardiopulmonary;;   VIDEO BRONCHOSCOPY WITH ENDOBRONCHIAL ULTRASOUND Right 10/30/2022   Procedure: VIDEO BRONCHOSCOPY WITH ENDOBRONCHIAL ULTRASOUND;  Surgeon: Leslye Peer, MD;  Location: [facility] ENDOSCOPY;  Service: Cardiopulmonary;  Laterality: Right;    REVIEW OF SYSTEMS:  A comprehensive review of systems was negative except for: Constitutional: positive for fatigue Respiratory: positive for dyspnea on exertion Musculoskeletal: positive for back pain and bone pain   PHYSICAL EXAMINATION: General appearance: alert, cooperative, fatigued, and no distress Head: Normocephalic, without obvious abnormality, atraumatic Neck: no adenopathy, no JVD, supple, symmetrical, trachea midline, and thyroid not enlarged, symmetric, no tenderness/mass/nodules Lymph nodes: Cervical, supraclavicular, and axillary nodes normal. Resp: clear to auscultation bilaterally Back: symmetric, no curvature. ROM normal. No CVA tenderness. Cardio: regular rate and rhythm, S1, S2 normal, no murmur, click, rub or gallop GI: soft, non-tender; bowel sounds normal; no masses,  no organomegaly Extremities: extremities normal, atraumatic, no cyanosis or edema  ECOG PERFORMANCE STATUS: 1 - Symptomatic but completely ambulatory  Blood pressure 131/80, pulse 68, temperature 97.8 F (36.6 C), temperature source  Temporal, resp. rate 16, height 5\' 10"  (1.778 m), weight 190 lb 3.2 oz (86.3 kg), SpO2 97 %.  LABORATORY DATA: Lab Results  Component Value Date   WBC 3.3 (L) 12/26/2022   HGB 10.5 (L) 12/26/2022   HCT 33.0 (L) 12/26/2022   MCV 92.2 12/26/2022   PLT 190 12/26/2022      Chemistry      Component Value Date/Time   NA 138 12/19/2022 1404   K 3.8 12/19/2022 1404   CL 102 12/19/2022 1404   CO2 30 12/19/2022 1404   BUN 10 12/19/2022 1404   CREATININE 0.70 12/19/2022 1404   CREATININE 0.83 12/05/2022 0821      Component Value Date/Time   CALCIUM 8.9 12/19/2022 1404   ALKPHOS 146 (H) 12/19/2022 1404   AST 8 (L) 12/19/2022 1404   AST 11 (L) 12/05/2022 0821   ALT 13 12/19/2022 1404   ALT 16 12/05/2022 0821   BILITOT 0.6 12/19/2022 1404   BILITOT 0.6 12/05/2022 0821       RADIOGRAPHIC STUDIES: ECHOCARDIOGRAM COMPLETE  Result Date: 12/21/2022    ECHOCARDIOGRAM REPORT   Patient Name:   Maxwell Grant Date of Exam: 12/21/2022 Medical Rec #:  [id number]      Height:  70.0 in Accession #:    [id number]     Weight:       185.1 lb Date of Birth:  [birth year]       BSA:          2.020 m Patient Age:    56 years       BP:           121/73 mmHg Patient Gender: M              HR:           83 bpm. Exam Location:  Outpatient Procedure: 2D Echo, 3D Echo, Color Doppler, Cardiac Doppler and Strain Analysis Indications:    CHF  History:        Patient has prior history of Echocardiogram examinations, most                 recent 03/13/2021. COPD; Risk Factors:Hypertension, Current                 Smoker, Dyslipidemia and Diabetes. Mediastinal adenopathy.  Sonographer:    Jeryl Columbia RDCS Referring Phys: [id number] VINEET SOOD  Sonographer Comments: Suboptimal parasternal window. Limited imaging from left decubitus. Patient uncomfortable on left side. IMPRESSIONS  1. Left ventricular ejection fraction by 3D volume is 55 %. The left ventricle has normal function. The left ventricle has no regional wall motion  abnormalities. Left ventricular diastolic parameters were normal.  2. Right ventricular systolic function is normal. The right ventricular size is mildly enlarged.  3. The mitral valve is normal in structure. Trivial mitral valve regurgitation. No evidence of mitral stenosis.  4. The aortic valve is normal in structure. Aortic valve regurgitation is trivial. No aortic stenosis is present.  5. The inferior vena cava is normal in size with greater than 50% respiratory variability, suggesting right atrial pressure of 3 mmHg. FINDINGS  Left Ventricle: Left ventricular ejection fraction by 3D volume is 55 %. The left ventricle has normal function. The left ventricle has no regional wall motion abnormalities. Global longitudinal strain performed but not reported based on interpreter judgement due to suboptimal tracking. The left ventricular internal cavity size was normal in size. There is no left ventricular hypertrophy. Left ventricular diastolic parameters were normal. Right Ventricle: The right ventricular size is mildly enlarged. No increase in right ventricular wall thickness. Right ventricular systolic function is normal. Left Atrium: Left atrial size was normal in size. Right Atrium: Right atrial size was normal in size. Pericardium: There is no evidence of pericardial effusion. Mitral Valve: The mitral valve is normal in structure. Trivial mitral valve regurgitation. No evidence of mitral valve stenosis. Tricuspid Valve: The tricuspid valve is normal in structure. Tricuspid valve regurgitation is trivial. No evidence of tricuspid stenosis. Aortic Valve: The aortic valve is normal in structure. Aortic valve regurgitation is trivial. No aortic stenosis is present. Pulmonic Valve: The pulmonic valve was normal in structure. Pulmonic valve regurgitation is not visualized. No evidence of pulmonic stenosis. Aorta: The aortic root is normal in size and structure. Venous: The inferior vena cava is normal in size with  greater than 50% respiratory variability, suggesting right atrial pressure of 3 mmHg. IAS/Shunts: No atrial level shunt detected by color flow Doppler.  LEFT VENTRICLE PLAX 2D LVIDd:         4.66 cm         Diastology LVIDs:         3.51 cm         LV e'  medial:    10.00 cm/s LV PW:         1.02 cm         LV E/e' medial:  7.4 LV IVS:        1.00 cm         LV e' lateral:   16.60 cm/s LVOT diam:     1.80 cm         LV E/e' lateral: 4.5 LVOT Area:     2.54 cm                                 3D Volume EF LV Volumes (MOD)               LV 3D EF:    Left LV vol d, MOD    96.2 ml                    ventricul A2C:                                        ar LV vol d, MOD    94.5 ml                    ejection A4C:                                        fraction LV vol s, MOD    31.3 ml                    by 3D A2C:                                        volume is LV vol s, MOD    43.4 ml                    55 %. A4C: LV SV MOD A2C:   64.9 ml LV SV MOD A4C:   94.5 ml       3D Volume EF: LV SV MOD BP:    60.5 ml       3D EF:        55 %                                LV EDV:       145 ml                                LV ESV:       65 ml                                LV SV:        80 ml RIGHT VENTRICLE RV Basal diam:  4.33 cm RV Mid diam:    3.99 cm TAPSE (M-mode): 3.4 cm LEFT ATRIUM             Index  RIGHT ATRIUM           Index LA diam:        3.80 cm 1.88 cm/m   RA Area:     14.90 cm LA Vol (A2C):   43.5 ml 21.54 ml/m  RA Volume:   38.90 ml  19.26 ml/m LA Vol (A4C):   31.7 ml 15.69 ml/m LA Biplane Vol: 38.4 ml 19.01 ml/m   AORTA Ao Root diam: 3.00 cm MITRAL VALVE MV Area (PHT): 3.34 cm    SHUNTS MV Decel Time: 227 msec    Systemic Diam: 1.80 cm MV E velocity: 74.40 cm/s MV A velocity: 51.90 cm/s MV E/A ratio:  1.43 Kardie Tobb DO Electronically signed by Thomasene Ripple DO Signature Date/Time: 12/21/2022/1:46:06 PM    Final    US Venous Img Lower Bilateral (DVT)  Result Date: 12/21/2022 CLINICAL DATA:  leg  swelling EXAM: BILATERAL LOWER EXTREMITY VENOUS DOPPLER ULTRASOUND TECHNIQUE: Gray-scale sonography with graded compression, as well as color Doppler and duplex ultrasound were performed to evaluate the lower extremity deep venous systems from the level of the common femoral vein and including the common femoral, femoral, profunda femoral, popliteal and calf veins including the posterior tibial, peroneal and gastrocnemius veins when visible. The superficial great saphenous vein was also interrogated. Spectral Doppler was utilized to evaluate flow at rest and with distal augmentation maneuvers in the common femoral, femoral and popliteal veins. COMPARISON:  MR pelvis, 11/07/2022. FINDINGS: RIGHT LOWER EXTREMITY VENOUS Normal compressibility of the RIGHT common femoral, superficial femoral, and popliteal veins. Visualized portions of profunda femoral vein and great saphenous vein unremarkable. Heterogeneously echogenic, near-occlusive filling defects within the imaged portions of the calf veins, specifically within the posterior tibial and peroneal veins. OTHER No evidence of superficial thrombophlebitis or abnormal fluid collection. Limitations: none LEFT LOWER EXTREMITY VENOUS Normal compressibility of the LEFT common femoral, superficial femoral, and popliteal veins, as well as the visualized calf veins. Visualized portions of profunda femoral vein and great saphenous vein unremarkable. No filling defects to suggest DVT on grayscale or color Doppler imaging. Doppler waveforms show normal direction of venous flow, normal respiratory plasticity and response to augmentation. OTHER No evidence of superficial thrombophlebitis or abnormal fluid collection. Limitations: none IMPRESSION: 1. Examination is POSITIVE for RIGHT lower extremity DVT, within the calf veins, as above. 2. No evidence of femoropopliteal DVT or superficial thrombophlebitis within the LEFT lower extremity. Roanna Banning, MD [facility] Electronically Signed   By: Roanna Banning M.D.   On: 12/21/2022 10:03    ASSESSMENT AND PLAN: This is a very pleasant 57 years old white male with stage IV (T3, N2, M1 C) non-small cell lung cancer, adenocarcinoma presented with large right upper lobe lung mass in addition to right hilar and subcarinal lymphadenopathy in addition to multiple metastatic bone lesions involving the pelvis as well as the lower thoracic and lumbar spines in addition to metastatic disease to the pelvic muscles diagnosed in February 2024.  Molecular studies showed positive KRAS G12C mutation and PD-L1 expression of 98%. The patient is here today to start the first cycle of systemic chemotherapy with carboplatin for AUC of 5, Alimta 500 Mg/M2 and Keytruda 200 Mg IV every 3 weeks.  First dose November 14, 2022.  Status post 2 cycles.   The patient has been tolerating this treatment well with no concerning adverse effects. I recommended for him to proceed with cycle #3 today as planned. I will see him back  for follow-up visit in 3 weeks for evaluation with repeat CT scan of the chest, abdomen and pelvis for restaging of his disease. For the right lower extremity deep venous thrombosis, I strongly encouraged the patient to start his treatment with Eliquis today once he goes home. He was advised to call immediately if he has any other concerning symptoms in the interval. The patient voices understanding of current disease status and treatment options and is in agreement with the current care plan.  All questions were answered. The patient knows to call the clinic with any problems, questions or concerns. We can certainly see the patient much sooner if necessary.  The total time spent in the appointment was 20 minutes.  Disclaimer: This note was dictated with voice recognition software. Similar sounding words can inadvertently be transcribed and may not be corrected upon review.

## 2022-12-27 ENCOUNTER — Other Ambulatory Visit (HOSPITAL_COMMUNITY): Payer: Self-pay

## 2022-12-27 ENCOUNTER — Other Ambulatory Visit: Payer: Self-pay

## 2022-12-27 LAB — T4: T4, Total: 7.3 ug/dL (ref 4.5–12.0)

## 2023-01-01 ENCOUNTER — Other Ambulatory Visit (HOSPITAL_COMMUNITY): Payer: Self-pay

## 2023-01-01 ENCOUNTER — Encounter (HOSPITAL_BASED_OUTPATIENT_CLINIC_OR_DEPARTMENT_OTHER): Payer: Self-pay | Admitting: Pulmonary Disease

## 2023-01-01 ENCOUNTER — Other Ambulatory Visit (HOSPITAL_BASED_OUTPATIENT_CLINIC_OR_DEPARTMENT_OTHER): Payer: Self-pay | Admitting: Pulmonary Disease

## 2023-01-01 ENCOUNTER — Ambulatory Visit (INDEPENDENT_AMBULATORY_CARE_PROVIDER_SITE_OTHER): Payer: Medicaid Other | Admitting: Pulmonary Disease

## 2023-01-01 VITALS — BP 122/68 | HR 77 | Temp 97.9°F | Ht 66.0 in | Wt 189.0 lb

## 2023-01-01 DIAGNOSIS — J9611 Chronic respiratory failure with hypoxia: Secondary | ICD-10-CM | POA: Diagnosis not present

## 2023-01-01 DIAGNOSIS — J449 Chronic obstructive pulmonary disease, unspecified: Secondary | ICD-10-CM

## 2023-01-01 DIAGNOSIS — I824Y1 Acute embolism and thrombosis of unspecified deep veins of right proximal lower extremity: Secondary | ICD-10-CM | POA: Diagnosis not present

## 2023-01-01 MED ORDER — TRELEGY ELLIPTA 100-62.5-25 MCG/ACT IN AEPB
1.0000 | INHALATION_SPRAY | Freq: Every day | RESPIRATORY_TRACT | 5 refills | Status: DC
  Filled 2023-01-01: qty 60, 30d supply, fill #0

## 2023-01-01 NOTE — Progress Notes (Signed)
[facility]  Chief Complaint  Patient presents with   Follow-up    Follow up. Patient has no complaints.     Constitutional:  BP 122/68 (BP Location: Left Arm, Patient Position: Sitting, Cuff Size: Normal)   Pulse 77   Temp 97.9 F (36.6 C) (Oral)   Ht 5\' 6"  (1.676 m)   Wt 189 lb (85.7 kg)   SpO2 90%   BMI 30.51 kg/m   Past Medical History:  Hypertension  Past Surgical History:  He  has a past surgical history that includes No past surgeries; Video bronchoscopy with endobronchial ultrasound (Right, 10/30/2022); Radiology with anesthesia (10/30/2022); Bronchial needle aspiration biopsy (10/30/2022); and IR IMAGING GUIDED PORT INSERTION (11/17/2022).  Brief Summary:  Maxwell Grant is a 57 y.o. male smoker with COPD, chronic respiratory failure, NSCLC, and Rt leg DVT.      Subjective:   He is here with his sister.  Started on chemotherapy.  He has scan later this month and then will determine if he needs radiation.  Spiriva and symbicort don't work as well as trelegy.  He is needing to use albuterol twice per day.  Still has cough and chest congestion.   He gets leg swelling when he sits/stands.  Better when he puts his legs up.   Physical Exam:   Appearance - well kempt   ENMT - no sinus tenderness, no oral exudate, no LAN, Mallampati 3 airway, no stridor  Respiratory - scattered rhonchi that clears with cough  CV - s1s2 regular rate and rhythm, no murmurs  Ext - no clubbing, no edema  Skin - no rashes  Psych - normal mood and affect      Pulmonary testing:  ABG on 3.5 liters 03/17/21 >> pH 7.48, PCO2 55.4, PO2 56.9 PFT 10/18/21 >> FEV1 1.65 (49%), FEV1% 48, TLC 6.57 (104%), RV 2.79 (138%), DLCO 61%  Chest Imaging:  V/Q scan 03/13/21 >> very low probability for PE CT angio chest 10/17/22 >> 5/6 x 5.6 RUL mass, 2.9 x 2.0 cm Rt hilar LAN, 2.4 x 1.6 cm subcarinal LAN, centrilobular emphysema  Sleep Tests:  Trilogy  03/18/21 to 10/09/21 >> used on 176 of 195 days with average 4 hrs 32 min.  Average Ti 1.4 sec, RR 21.    Cardiac Tests:  Echo 12/21/22 >> EF 55%, mild RV enlargement Doppler legs 12/21/22 >> Rt posterior tibial and peroneal vein DVT   Social History:  He  reports that he has been smoking cigarettes. He has been smoking an average of 2 packs per day. He has never used smokeless tobacco. He reports current alcohol use of about 1.0 standard drink of alcohol per week. He reports current drug use. Drug: Marijuana.  Family History:  His family history is not on file.     Assessment/Plan:   COPD with emphysema. - spiriva and symbicort weren't effective - will switch to trelegy 100 one puff daily - prn albuterol - he will call if he wants a nebulizer machine  Tobacco abuse. - he will try quitting on his own  Chronic respiratory failure with hypoxia and hypercapnia. - using Trilogy home vent and 3 liters oxygen at night - TTV-VAPS-AE, min EPAP 5/max EPAP 15 cm H2O, Pinsp 5 cm H2O, Pinsp max 20 cm H2O, Ti min 0.7 sec/Ti max 2 sec with auto Ti, target Vt 500 ml - uses [employer] [facility]  Right leg DVT. - continue eliquis  Non-small cell lung cancer (  adenocarcinoma) Stage IV. - diagnosed February 2024 - metastatic disease to T11, L2, L5, pelvis - followed by Dr. Arbutus Ped with oncology and Dr. Mitzi Hansen of radiation oncology  Time Spent Involved in Patient Care on Day of Examination:  35 minutes  Follow up:   Patient Instructions  Trelegy 1 puff daily, and rinse your mouth after each use  Follow up in 6 months  Medication List:   Allergies as of 01/01/2023       Reactions   Statins Other (See Comments)   Joint pains   Aspirin Diarrhea, Nausea And Vomiting   Flexeril [cyclobenzaprine] Other (See Comments)   hallucinations   Penicillins Diarrhea, Nausea And Vomiting        Medication List        Accurate as of January 01, 2023  8:49 AM. If you have any questions, ask  your nurse or doctor.          STOP taking these medications    Spiriva Respimat 2.5 MCG/ACT Aers Generic drug: Tiotropium Bromide Monohydrate Stopped by: Coralyn Helling, MD       TAKE these medications    Accu-Chek FastClix Lancets Misc Use to test blood sugar 4 times a day   Eliquis DVT/PE Starter Pack Generic drug: Apixaban Starter Pack (10mg  and 5mg ) Take as directed on package: start with two-5mg  tablets twice daily for 7 days. On day 8, switch to one-5mg  tablet twice daily.   folic acid 1 MG tablet Commonly known as: FOLVITE Take 1 tablet (1 mg total) by mouth daily.   furosemide 20 MG tablet Commonly known as: LASIX Take 1 tablet (20 mg total) by mouth once as needed for up to 1 dose. As long as your systolic blood pressure is >120   glimepiride 2 MG tablet Commonly known as: AMARYL Take 2 mg by mouth in the morning.   lidocaine-prilocaine cream Commonly known as: EMLA Apply to [facility] Prior to Access as needed   meloxicam 7.5 MG tablet Commonly known as: MOBIC Take 1 tablet (7.5 mg total) by mouth daily.   oxyCODONE-acetaminophen 7.5-325 MG tablet Commonly known as: PERCOCET Take 1 tablet by mouth every 6 hours as needed for severe pain.   OxyCONTIN 20 mg 12 hr tablet Generic drug: oxyCODONE Take 1 tablet (20 mg total) by mouth every 12 (twelve) hours.   prochlorperazine 10 MG tablet Commonly known as: COMPAZINE Take 1 tablet (10 mg total) by mouth every 6 (six) hours as needed for nausea or vomiting.   Trelegy Ellipta 100-62.5-25 MCG/ACT Aepb Generic drug: Fluticasone-Umeclidin-Vilant Inhale 1 puff into the lungs daily in the afternoon. Started by: Coralyn Helling, MD   Ventolin HFA 108 (90 Base) MCG/ACT inhaler Generic drug: albuterol Inhale 1-2 puffs into the lungs every 6 (six) hours as needed for wheezing or shortness of breath.        Signature:  Coralyn Helling, MD  [facility] Pager - [phone] 01/01/2023, 8:49 AM

## 2023-01-01 NOTE — Patient Instructions (Signed)
Trelegy 1 puff daily, and rinse your mouth after each use  Follow up in 6 months

## 2023-01-02 ENCOUNTER — Inpatient Hospital Stay: Payer: Medicaid Other

## 2023-01-02 DIAGNOSIS — Z5112 Encounter for antineoplastic immunotherapy: Secondary | ICD-10-CM | POA: Diagnosis not present

## 2023-01-02 DIAGNOSIS — Z95828 Presence of other vascular implants and grafts: Secondary | ICD-10-CM

## 2023-01-02 DIAGNOSIS — C3490 Malignant neoplasm of unspecified part of unspecified bronchus or lung: Secondary | ICD-10-CM

## 2023-01-02 LAB — COMPREHENSIVE METABOLIC PANEL
ALT: 11 U/L (ref 0–44)
AST: 11 U/L — ABNORMAL LOW (ref 15–41)
Albumin: 3.9 g/dL (ref 3.5–5.0)
Alkaline Phosphatase: 119 U/L (ref 38–126)
Anion gap: 5 (ref 5–15)
BUN: 16 mg/dL (ref 6–20)
CO2: 31 mmol/L (ref 22–32)
Calcium: 9.2 mg/dL (ref 8.9–10.3)
Chloride: 104 mmol/L (ref 98–111)
Creatinine, Ser: 0.73 mg/dL (ref 0.61–1.24)
GFR, Estimated: >60 mL/min (ref >60)
Glucose, Bld: 86 mg/dL (ref 70–99)
Potassium: 3.9 mmol/L (ref 3.5–5.1)
Sodium: 140 mmol/L (ref 135–145)
Total Bilirubin: 0.6 mg/dL (ref 0.3–1.2)
Total Protein: 6.8 g/dL (ref 6.5–8.1)

## 2023-01-02 LAB — CBC WITH DIFFERENTIAL/PLATELET
Abs Immature Granulocytes: 0 10*3/uL (ref 0.00–0.07)
Basophils Absolute: 0 10*3/uL (ref 0.0–0.1)
Basophils Relative: 1 %
Eosinophils Absolute: 0.1 10*3/uL (ref 0.0–0.5)
Eosinophils Relative: 5 %
HCT: 32.1 % — ABNORMAL LOW (ref 39.0–52.0)
Hemoglobin: 10.4 g/dL — ABNORMAL LOW (ref 13.0–17.0)
Immature Granulocytes: 0 %
Lymphocytes Relative: 50 %
Lymphs Abs: 1.1 10*3/uL (ref 0.7–4.0)
MCH: 29.4 pg (ref 26.0–34.0)
MCHC: 32.4 g/dL (ref 30.0–36.0)
MCV: 90.7 fL (ref 80.0–100.0)
Monocytes Absolute: 0.2 10*3/uL (ref 0.1–1.0)
Monocytes Relative: 7 %
Neutro Abs: 0.8 10*3/uL — ABNORMAL LOW (ref 1.7–7.7)
Neutrophils Relative %: 37 %
Platelets: 141 10*3/uL — ABNORMAL LOW (ref 150–400)
RBC: 3.54 MIL/uL — ABNORMAL LOW (ref 4.22–5.81)
RDW: 20.7 % — ABNORMAL HIGH (ref 11.5–15.5)
Smear Review: NORMAL
WBC: 2.1 10*3/uL — ABNORMAL LOW (ref 4.0–10.5)
nRBC: 0 % (ref 0.0–0.2)

## 2023-01-02 MED ORDER — HEPARIN SOD (PORK) LOCK FLUSH 100 UNIT/ML IV SOLN
500.0000 [IU] | Freq: Once | INTRAVENOUS | Status: AC
  Administered 2023-01-02: 500 [IU]

## 2023-01-02 MED ORDER — SODIUM CHLORIDE 0.9% FLUSH
10.0000 mL | Freq: Once | INTRAVENOUS | Status: AC
  Administered 2023-01-02: 10 mL

## 2023-01-03 ENCOUNTER — Other Ambulatory Visit (HOSPITAL_COMMUNITY): Payer: Self-pay

## 2023-01-03 ENCOUNTER — Other Ambulatory Visit: Payer: Self-pay | Admitting: Nurse Practitioner

## 2023-01-03 DIAGNOSIS — G893 Neoplasm related pain (acute) (chronic): Secondary | ICD-10-CM

## 2023-01-03 DIAGNOSIS — Z515 Encounter for palliative care: Secondary | ICD-10-CM

## 2023-01-03 DIAGNOSIS — C3490 Malignant neoplasm of unspecified part of unspecified bronchus or lung: Secondary | ICD-10-CM

## 2023-01-03 MED ORDER — OXYCODONE HCL ER 20 MG PO T12A
20.0000 mg | EXTENDED_RELEASE_TABLET | Freq: Two times a day (BID) | ORAL | 0 refills | Status: AC
  Filled 2023-01-04: qty 60, 30d supply, fill #0

## 2023-01-04 ENCOUNTER — Other Ambulatory Visit (HOSPITAL_COMMUNITY): Payer: Self-pay

## 2023-01-07 ENCOUNTER — Encounter: Payer: Self-pay | Admitting: Internal Medicine

## 2023-01-08 ENCOUNTER — Encounter: Payer: Self-pay | Admitting: Medical Oncology

## 2023-01-09 ENCOUNTER — Other Ambulatory Visit: Payer: Self-pay | Admitting: Nurse Practitioner

## 2023-01-09 ENCOUNTER — Other Ambulatory Visit: Payer: Self-pay

## 2023-01-09 ENCOUNTER — Inpatient Hospital Stay: Payer: Medicaid Other

## 2023-01-09 DIAGNOSIS — C3490 Malignant neoplasm of unspecified part of unspecified bronchus or lung: Secondary | ICD-10-CM

## 2023-01-09 DIAGNOSIS — Z5112 Encounter for antineoplastic immunotherapy: Secondary | ICD-10-CM | POA: Diagnosis not present

## 2023-01-09 DIAGNOSIS — G893 Neoplasm related pain (acute) (chronic): Secondary | ICD-10-CM

## 2023-01-09 DIAGNOSIS — Z95828 Presence of other vascular implants and grafts: Secondary | ICD-10-CM

## 2023-01-09 DIAGNOSIS — Z515 Encounter for palliative care: Secondary | ICD-10-CM

## 2023-01-09 LAB — CBC WITH DIFFERENTIAL/PLATELET
Abs Immature Granulocytes: 0.01 10*3/uL (ref 0.00–0.07)
Basophils Absolute: 0 10*3/uL (ref 0.0–0.1)
Basophils Relative: 0 %
Eosinophils Absolute: 0.1 10*3/uL (ref 0.0–0.5)
Eosinophils Relative: 4 %
HCT: 32.2 % — ABNORMAL LOW (ref 39.0–52.0)
Hemoglobin: 10.2 g/dL — ABNORMAL LOW (ref 13.0–17.0)
Immature Granulocytes: 0 %
Lymphocytes Relative: 27 %
Lymphs Abs: 0.9 10*3/uL (ref 0.7–4.0)
MCH: 30.2 pg (ref 26.0–34.0)
MCHC: 31.7 g/dL (ref 30.0–36.0)
MCV: 95.3 fL (ref 80.0–100.0)
Monocytes Absolute: 0.3 10*3/uL (ref 0.1–1.0)
Monocytes Relative: 10 %
Neutro Abs: 1.9 10*3/uL (ref 1.7–7.7)
Neutrophils Relative %: 59 %
Platelets: 74 10*3/uL — ABNORMAL LOW (ref 150–400)
RBC: 3.38 MIL/uL — ABNORMAL LOW (ref 4.22–5.81)
RDW: 22.5 % — ABNORMAL HIGH (ref 11.5–15.5)
WBC: 3.2 10*3/uL — ABNORMAL LOW (ref 4.0–10.5)
nRBC: 0 % (ref 0.0–0.2)

## 2023-01-09 LAB — COMPREHENSIVE METABOLIC PANEL
ALT: 9 U/L (ref 0–44)
AST: 11 U/L — ABNORMAL LOW (ref 15–41)
Albumin: 3.8 g/dL (ref 3.5–5.0)
Alkaline Phosphatase: 121 U/L (ref 38–126)
Anion gap: 6 (ref 5–15)
BUN: 11 mg/dL (ref 6–20)
CO2: 31 mmol/L (ref 22–32)
Calcium: 9.2 mg/dL (ref 8.9–10.3)
Chloride: 103 mmol/L (ref 98–111)
Creatinine, Ser: 0.83 mg/dL (ref 0.61–1.24)
GFR, Estimated: >60 mL/min (ref >60)
Glucose, Bld: 149 mg/dL — ABNORMAL HIGH (ref 70–99)
Potassium: 3.8 mmol/L (ref 3.5–5.1)
Sodium: 140 mmol/L (ref 135–145)
Total Bilirubin: 0.5 mg/dL (ref 0.3–1.2)
Total Protein: 6.4 g/dL — ABNORMAL LOW (ref 6.5–8.1)

## 2023-01-09 MED ORDER — SODIUM CHLORIDE 0.9% FLUSH
10.0000 mL | Freq: Once | INTRAVENOUS | Status: AC | PRN
  Administered 2023-01-09: 10 mL

## 2023-01-09 MED ORDER — HEPARIN SOD (PORK) LOCK FLUSH 100 UNIT/ML IV SOLN: 250.0000 [IU] | Freq: Once | INTRAVENOUS | Status: DC | PRN

## 2023-01-09 MED ORDER — HEPARIN SOD (PORK) LOCK FLUSH 100 UNIT/ML IV SOLN
500.0000 [IU] | Freq: Once | INTRAVENOUS | Status: AC | PRN
  Administered 2023-01-09: 500 [IU]

## 2023-01-09 MED ORDER — ALTEPLASE 2 MG IJ SOLR: 2.0000 mg | Freq: Once | INTRAMUSCULAR | Status: DC | PRN

## 2023-01-10 ENCOUNTER — Encounter: Payer: Self-pay | Admitting: Internal Medicine

## 2023-01-10 ENCOUNTER — Telehealth: Payer: Self-pay | Admitting: Pulmonary Disease

## 2023-01-10 ENCOUNTER — Other Ambulatory Visit: Payer: Self-pay

## 2023-01-10 ENCOUNTER — Other Ambulatory Visit (HOSPITAL_COMMUNITY): Payer: Self-pay

## 2023-01-10 MED ORDER — OXYCODONE-ACETAMINOPHEN 7.5-325 MG PO TABS
1.0000 | ORAL_TABLET | Freq: Four times a day (QID) | ORAL | 0 refills | Status: DC | PRN
Start: 2023-01-10 — End: 2023-02-04
  Filled 2023-01-10: qty 60, 15d supply, fill #0

## 2023-01-10 NOTE — Telephone Encounter (Signed)
It looks as if both Symbicort and Spiriva were sent in and filled on 12-01-2022. Are alternatives still requested? Please advise

## 2023-01-10 NOTE — Telephone Encounter (Signed)
ONO with 3 liters O2 12/07/22 >> test time 7 hrs 13 min.  Baseline SpO2 90%, low SpO2 71%.  Spent 2 hrs 6 min with SpO2 < 88%.   Please verify with him whether he did overnight oximetry while just wearing supplemental oxygen, or if he did it with oxygen attached to his Trilogy home vent.  The order was to have this done with 3 liters oxygen attached to home vent, but report only mentions it was done with 3 liters oxygen.

## 2023-01-12 ENCOUNTER — Ambulatory Visit (HOSPITAL_COMMUNITY)
Admission: RE | Admit: 2023-01-12 | Discharge: 2023-01-12 | Disposition: A | Discharge status: Home / Self Care | Payer: Medicaid Other | Source: Ambulatory Visit | Attending: Internal Medicine | Admitting: Internal Medicine

## 2023-01-12 DIAGNOSIS — C3490 Malignant neoplasm of unspecified part of unspecified bronchus or lung: Secondary | ICD-10-CM | POA: Diagnosis not present

## 2023-01-12 MED ORDER — IOHEXOL 300 MG/ML  SOLN
100.0000 mL | Freq: Once | INTRAMUSCULAR | Status: AC | PRN
  Administered 2023-01-12: 100 mL via INTRAVENOUS

## 2023-01-12 MED ORDER — HEPARIN SOD (PORK) LOCK FLUSH 100 UNIT/ML IV SOLN
500.0000 [IU] | Freq: Once | INTRAVENOUS | Status: AC
  Administered 2023-01-12: 500 [IU] via INTRAVENOUS

## 2023-01-12 MED ORDER — HEPARIN SOD (PORK) LOCK FLUSH 100 UNIT/ML IV SOLN
INTRAVENOUS | Status: AC
  Filled 2023-01-12: qty 5

## 2023-01-12 NOTE — Telephone Encounter (Signed)
Please advise 

## 2023-01-12 NOTE — Telephone Encounter (Signed)
Called and spoke w/ pt emergency contact she states that she believes pt done the overnight w/ the O2 bled into the Trilogy vent.  Dr.Sood please advise?

## 2023-01-15 ENCOUNTER — Other Ambulatory Visit (HOSPITAL_COMMUNITY): Payer: Self-pay

## 2023-01-15 ENCOUNTER — Other Ambulatory Visit (HOSPITAL_BASED_OUTPATIENT_CLINIC_OR_DEPARTMENT_OTHER): Payer: Self-pay | Admitting: Pulmonary Disease

## 2023-01-15 MED ORDER — TRELEGY ELLIPTA 100-62.5-25 MCG/ACT IN AEPB
1.0000 | INHALATION_SPRAY | Freq: Every day | RESPIRATORY_TRACT | 5 refills | Status: AC
  Filled 2023-01-15 – 2023-02-15 (×4): qty 60, 30d supply, fill #0
  Filled 2023-03-12: qty 60, 30d supply, fill #1
  Filled 2023-04-17: qty 60, 30d supply, fill #2
  Filled 2023-05-13: qty 60, 30d supply, fill #3
  Filled 2023-07-17: qty 60, 30d supply, fill #4
  Filled 2023-08-12: qty 60, 30d supply, fill #5

## 2023-01-15 MED FILL — Dexamethasone Sodium Phosphate Inj 100 MG/10ML: INTRAMUSCULAR | Qty: 1 | Status: AC

## 2023-01-15 MED FILL — Fosaprepitant Dimeglumine For IV Infusion 150 MG (Base Eq): INTRAVENOUS | Qty: 5 | Status: AC

## 2023-01-15 NOTE — Progress Notes (Unsigned)
Palliative Medicine [facility]  [phone] Fax:([phone]) [phone]   Name: Maxwell Grant Date: 01/15/2023 MRN: [id number]  DOB: 08-07-1966  Patient Care Team: Tracey Harries, MD as PCP - General (Family Medicine)    INTERVAL HISTORY: Maxwell Grant is a 57 y.o. male with  oncologic medical history including new diagnosed non-small cell lung cancer (10/2022), as well as a history of COPD, diabetes mellitus, hypertension, dyslipidemia as well as history of osteomyelitis of the back.  Palliative ask to see for symptom and pain management and goals of care.     SOCIAL HISTORY:    Maxwell Grant reports that he has been smoking cigarettes. He has been smoking an average of 2 packs per day. He has never used smokeless tobacco. He reports current alcohol use of about 1.0 standard drink of alcohol per week. He reports current drug use. Drug: Marijuana.  ADVANCE DIRECTIVES: none on file   CODE STATUS: Full Code  PAST MEDICAL HISTORY: Past Medical History:  Diagnosis Date   CHF (congestive heart failure) (HCC)    COPD (chronic obstructive pulmonary disease) (HCC)    Diabetes mellitus without complication (HCC)    Dyspnea    Hypertension    Lung cancer (HCC) 10/2022   Right leg DVT (HCC) 12/21/2022    ALLERGIES:  is allergic to statins, aspirin, flexeril [cyclobenzaprine], and penicillins.  MEDICATIONS:  Current Outpatient Medications  Medication Sig Dispense Refill   Accu-Chek FastClix Lancets MISC Use to test blood sugar 4 times a day 102 each 0   albuterol (VENTOLIN HFA) 108 (90 Base) MCG/ACT inhaler Inhale 1-2 puffs into the lungs every 6 (six) hours as needed for wheezing or shortness of breath. 18 g 3   APIXABAN (ELIQUIS) VTE STARTER PACK (10MG  AND 5MG ) Take as directed on package: start with two-5mg  tablets twice daily for 7 days. On day 8, switch to one-5mg  tablet twice daily. 74 each 0   Fluticasone-Umeclidin-Vilant (TRELEGY ELLIPTA) 100-62.5-25  MCG/ACT AEPB Inhale 1 puff into the lungs daily in the afternoon. 60 each 5   folic acid (FOLVITE) 1 MG tablet Take 1 tablet (1 mg total) by mouth daily. 30 tablet 4   glimepiride (AMARYL) 2 MG tablet Take 2 mg by mouth in the morning.     lidocaine-prilocaine (EMLA) cream Apply to [facility] Prior to Access as needed (Patient not taking: Reported on 01/16/2023) 30 g 2   meloxicam (MOBIC) 7.5 MG tablet Take 1 tablet (7.5 mg total) by mouth daily. 30 tablet 0   oxyCODONE (OXYCONTIN) 20 mg 12 hr tablet Take 1 tablet (20 mg total) by mouth every 12 (twelve) hours. 60 tablet 0   oxyCODONE-acetaminophen (PERCOCET) 7.5-325 MG tablet Take 1 tablet by mouth every 6 hours as needed for severe pain. 60 tablet 0   prochlorperazine (COMPAZINE) 10 MG tablet Take 1 tablet (10 mg total) by mouth every 6 (six) hours as needed for nausea or vomiting. (Patient not taking: Reported on 01/16/2023) 30 tablet 0   No current facility-administered medications for this visit.   Facility-Administered Medications Ordered in Other Visits  Medication Dose Route Frequency Provider Last Rate Last Admin   sodium chloride flush (NS) 0.9 % injection 10 mL  10 mL Intracatheter PRN Si Gaul, MD   10 mL at 01/16/23 1440    VITAL SIGNS: There were no vitals taken for this visit. There were no vitals filed for this visit.  Estimated body mass index is 30.62 kg/m as calculated from the following:  Height as of 01/01/23: 5\' 6"  (1.676 m).   Weight as of an earlier encounter on 01/16/23: 86 kg.   PERFORMANCE STATUS (ECOG) : 2 - Symptomatic, <50% confined to bed   Physical Exam General: NAD Cardiovascular: regular rate and rhythm Pulmonary: normal breathing pattern  Abdomen: soft, nontender Extremities: bilateral lower extremity pitting edema, no joint deformities Skin: no rashes Neurological: drowsy, oriented x 4   IMPRESSION: Maxwell Grant is seen in infusion area. His sister has accompanied him to the treatment today.  Patient is initially sleeping but is easily aroused and oriented. Reports that overall things continue to go well. Reports trying to stay active and recently mowed the lawn. Maxwell Grant denies nausea, vomiting, constipation, and diarrhea. Reports eating and sleeping well. Continues to experience intermittent left hip pain with prolonged activity.    Neoplasm related pain Maxwell Grant endorses intermittent pain to his left hip that is worse with activity. He is taking Oxycontin 20 mg every 12 hours. He is taking Percocet 7.5/325 mg every 6 hours as needed - averages taking about twice per day.   Reports that Mobic has helped significantly with pain and requests refill. Refill sent to patient's pharmacy.  We will continue to closely monitor and adjust medications as needed.     We discussed the importance of continued conversation with family and their medical providers regarding overall plan of care and treatment options, ensuring decisions are within the context of the patients values and GOCs.  PLAN: OxyContin 20 mg every 12 hours.  Oxycodone 7.5/325 -15mg  every 6 hours as needed for breakthrough pain. Not requiring around the clock.  Mobic 7.5 mg daily. MiraLAX twice daily. Colace daily. Ongoing symptom management support and goals of care discussions. Palliative will plan to see patient back in 2-4 weeks in collaboration to other oncology appointments.  Knows to contact office sooner if needed.   Patient expressed understanding and was in agreement with this plan. He also understands that He can call the clinic at any time with any questions, concerns, or complaints.    Any controlled substances utilized were prescribed in the context of palliative care. PDMP has been reviewed.   I assessed patient with Marylene Land, NP Student. Agree with above findings.   Visit consisted of counseling and education dealing with the complex and emotionally intense issues of symptom management and palliative  care in the setting of serious and potentially life-threatening illness.Greater than 50%  of this time was spent counseling and coordinating care related to the above assessment and plan.  [name], AGPCNP-BC  Palliative Medicine Team/[facility]  *Please note that this is a verbal dictation therefore any spelling or grammatical errors are due to the "[employer] One" system interpretation.

## 2023-01-15 NOTE — Telephone Encounter (Signed)
Then please send an order to have his O2 at night increased to 3 liters bleed in with NIV.

## 2023-01-16 ENCOUNTER — Inpatient Hospital Stay (HOSPITAL_BASED_OUTPATIENT_CLINIC_OR_DEPARTMENT_OTHER): Payer: Medicaid Other | Admitting: Internal Medicine

## 2023-01-16 ENCOUNTER — Other Ambulatory Visit (HOSPITAL_COMMUNITY): Payer: Self-pay

## 2023-01-16 ENCOUNTER — Other Ambulatory Visit (HOSPITAL_BASED_OUTPATIENT_CLINIC_OR_DEPARTMENT_OTHER): Payer: Self-pay | Admitting: Pulmonary Disease

## 2023-01-16 ENCOUNTER — Inpatient Hospital Stay: Payer: Medicaid Other

## 2023-01-16 ENCOUNTER — Inpatient Hospital Stay (HOSPITAL_BASED_OUTPATIENT_CLINIC_OR_DEPARTMENT_OTHER): Payer: Medicaid Other | Admitting: Nurse Practitioner

## 2023-01-16 ENCOUNTER — Encounter: Payer: Self-pay | Admitting: Internal Medicine

## 2023-01-16 ENCOUNTER — Encounter: Payer: Self-pay | Admitting: Medical Oncology

## 2023-01-16 VITALS — BP 151/82 | HR 70 | Temp 98.1°F | Resp 14 | Wt 189.7 lb

## 2023-01-16 DIAGNOSIS — C3490 Malignant neoplasm of unspecified part of unspecified bronchus or lung: Secondary | ICD-10-CM | POA: Diagnosis not present

## 2023-01-16 DIAGNOSIS — Z95828 Presence of other vascular implants and grafts: Secondary | ICD-10-CM

## 2023-01-16 DIAGNOSIS — Z515 Encounter for palliative care: Secondary | ICD-10-CM

## 2023-01-16 DIAGNOSIS — M25552 Pain in left hip: Secondary | ICD-10-CM

## 2023-01-16 DIAGNOSIS — G893 Neoplasm related pain (acute) (chronic): Secondary | ICD-10-CM

## 2023-01-16 DIAGNOSIS — R530 Neoplastic (malignant) related fatigue: Secondary | ICD-10-CM

## 2023-01-16 DIAGNOSIS — Z5112 Encounter for antineoplastic immunotherapy: Secondary | ICD-10-CM | POA: Diagnosis not present

## 2023-01-16 LAB — CBC WITH DIFFERENTIAL (CANCER CENTER ONLY)
Abs Immature Granulocytes: 0 10*3/uL (ref 0.00–0.07)
Basophils Absolute: 0 10*3/uL (ref 0.0–0.1)
Basophils Relative: 1 %
Eosinophils Absolute: 0.2 10*3/uL (ref 0.0–0.5)
Eosinophils Relative: 5 %
HCT: 34.6 % — ABNORMAL LOW (ref 39.0–52.0)
Hemoglobin: 11.2 g/dL — ABNORMAL LOW (ref 13.0–17.0)
Immature Granulocytes: 0 %
Lymphocytes Relative: 35 %
Lymphs Abs: 1.1 10*3/uL (ref 0.7–4.0)
MCH: 31 pg (ref 26.0–34.0)
MCHC: 32.4 g/dL (ref 30.0–36.0)
MCV: 95.8 fL (ref 80.0–100.0)
Monocytes Absolute: 0.5 10*3/uL (ref 0.1–1.0)
Monocytes Relative: 14 %
Neutro Abs: 1.5 10*3/uL — ABNORMAL LOW (ref 1.7–7.7)
Neutrophils Relative %: 45 %
Platelet Count: 157 10*3/uL (ref 150–400)
RBC: 3.61 MIL/uL — ABNORMAL LOW (ref 4.22–5.81)
RDW: 23 % — ABNORMAL HIGH (ref 11.5–15.5)
Smear Review: NORMAL
WBC Count: 3.2 10*3/uL — ABNORMAL LOW (ref 4.0–10.5)
nRBC: 0 % (ref 0.0–0.2)

## 2023-01-16 LAB — CMP (CANCER CENTER ONLY)
ALT: 9 U/L (ref 0–44)
AST: 13 U/L — ABNORMAL LOW (ref 15–41)
Albumin: 4.1 g/dL (ref 3.5–5.0)
Alkaline Phosphatase: 119 U/L (ref 38–126)
Anion gap: 3 — ABNORMAL LOW (ref 5–15)
BUN: 13 mg/dL (ref 6–20)
CO2: 32 mmol/L (ref 22–32)
Calcium: 9.4 mg/dL (ref 8.9–10.3)
Chloride: 104 mmol/L (ref 98–111)
Creatinine: 0.74 mg/dL (ref 0.61–1.24)
GFR, Estimated: >60 mL/min (ref >60)
Glucose, Bld: 59 mg/dL — ABNORMAL LOW (ref 70–99)
Potassium: 4.1 mmol/L (ref 3.5–5.1)
Sodium: 139 mmol/L (ref 135–145)
Total Bilirubin: 0.5 mg/dL (ref 0.3–1.2)
Total Protein: 6.7 g/dL (ref 6.5–8.1)

## 2023-01-16 IMAGING — DX DG CHEST 2V
2 series · 2 of 2 positions shown · non-contrast
Comparison: 03/16/2021

CLINICAL DATA: Pneumonia, follow-up

EXAM:
CHEST - 2 VIEW

[chest pa]
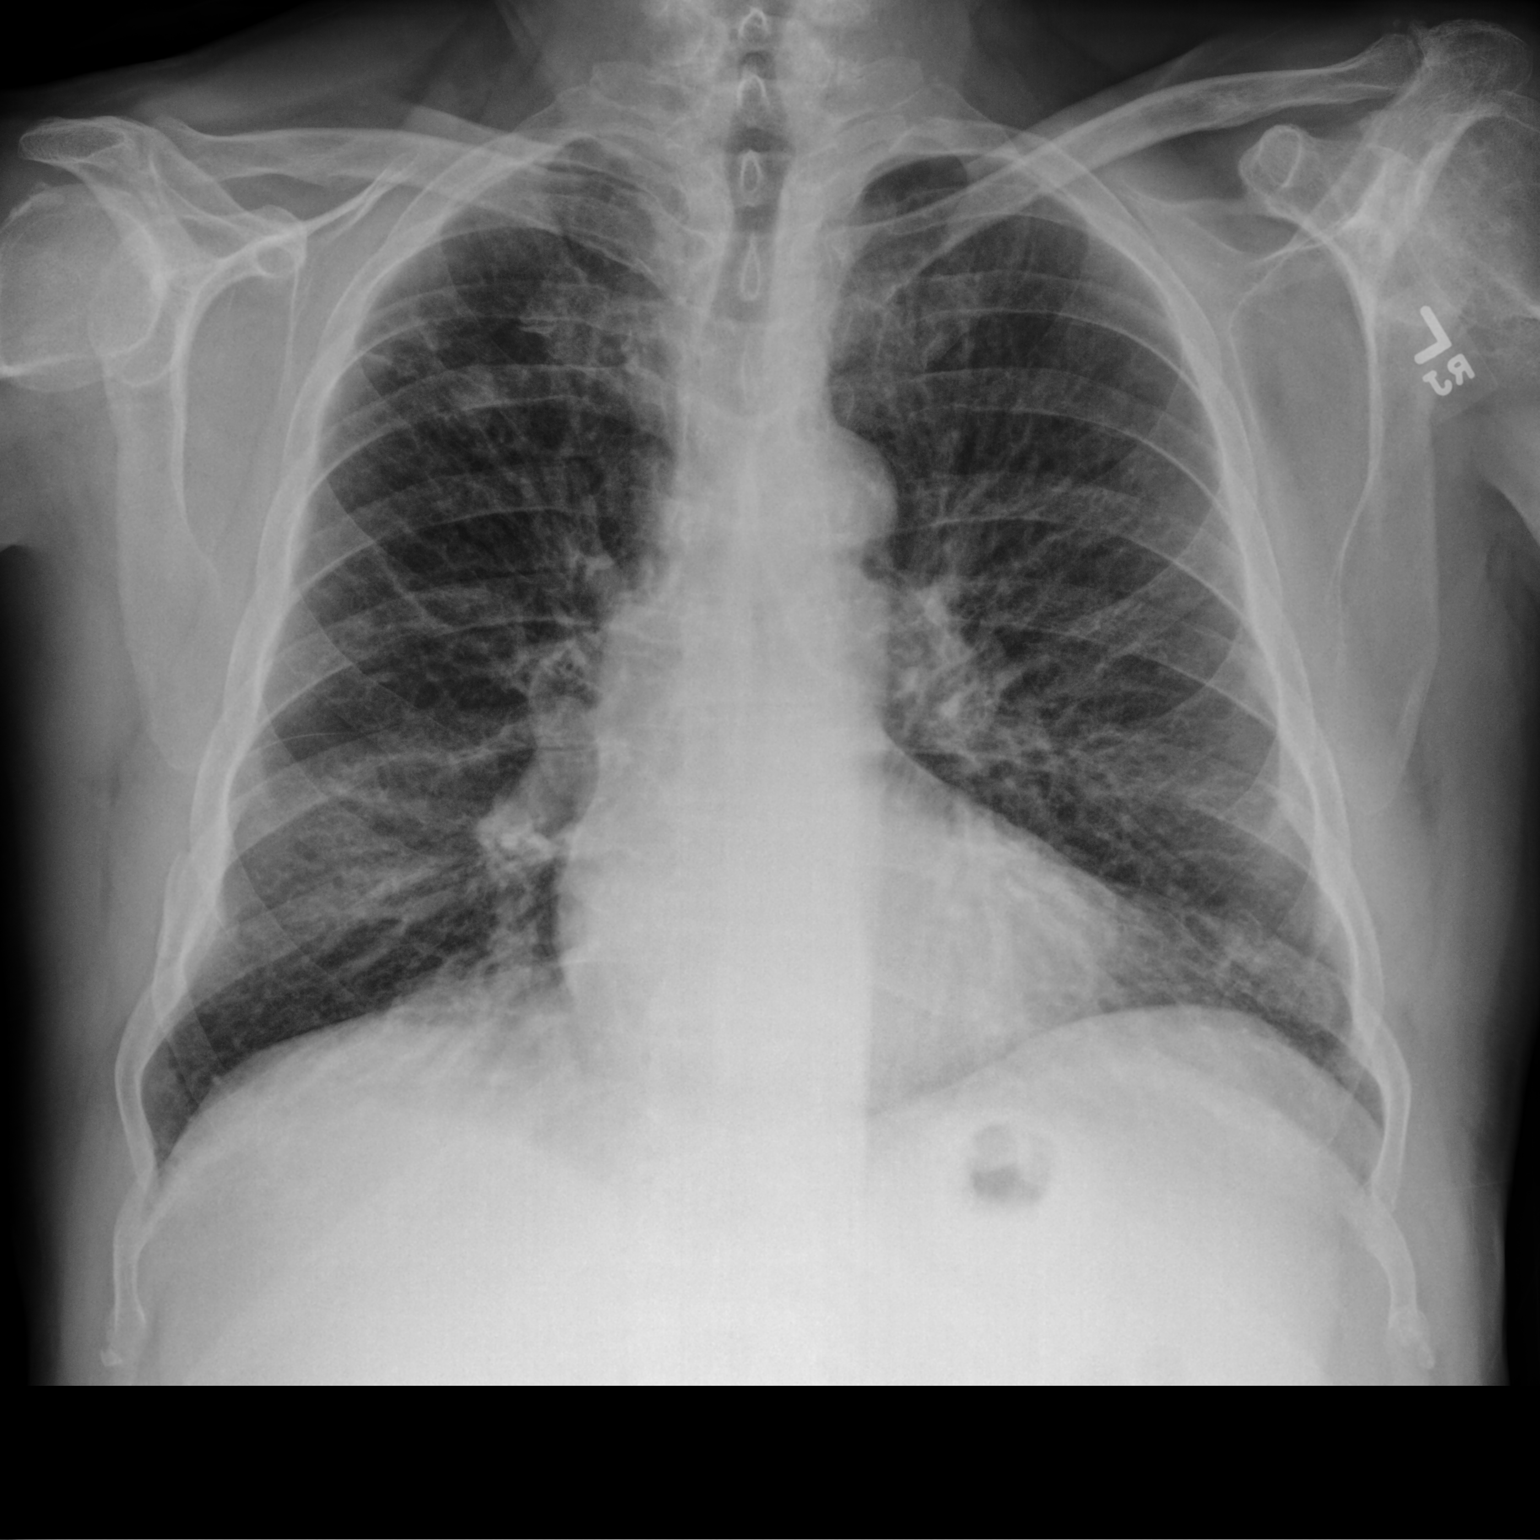

[chest lat]
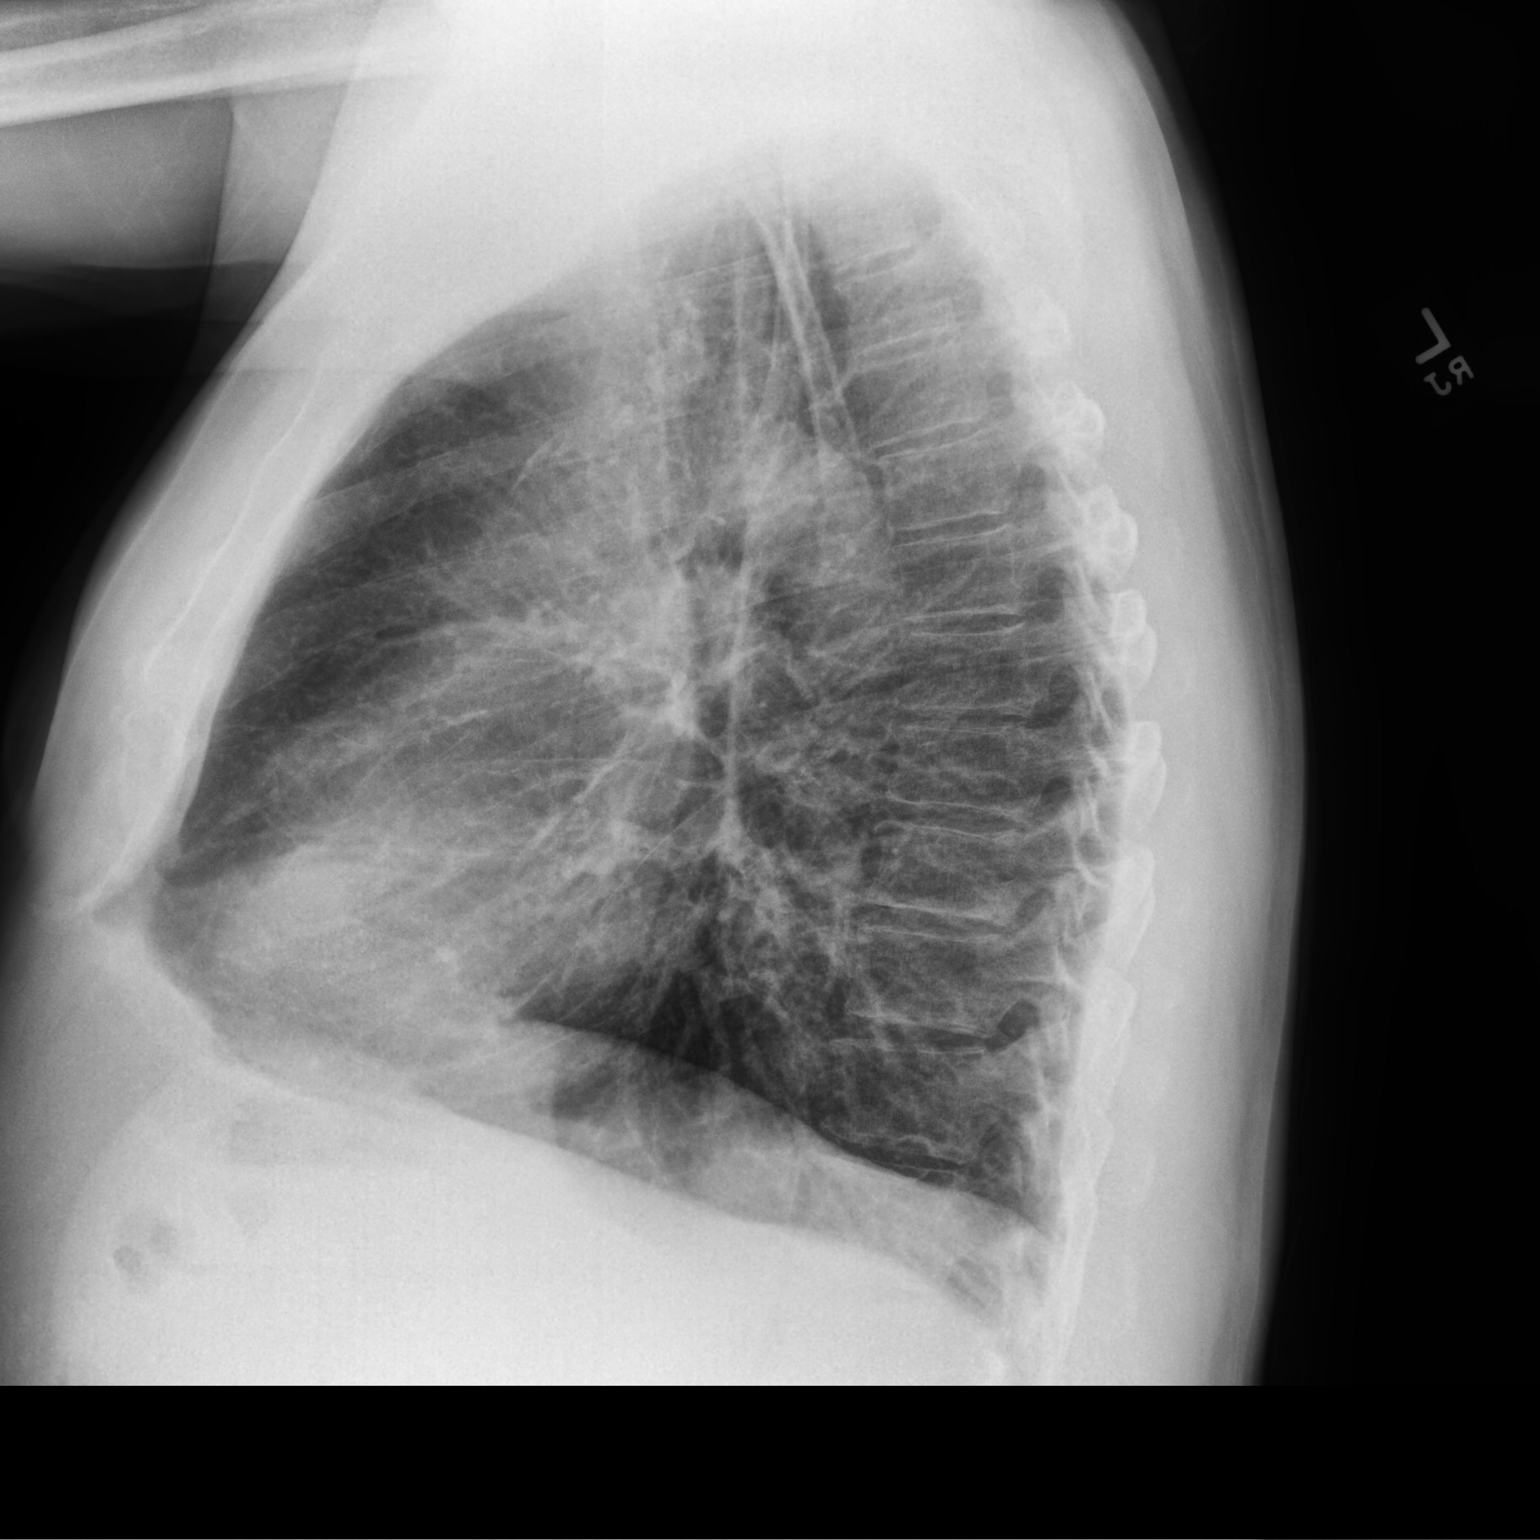

[2 of 2 positions shown; findings below may reference images not displayed]

FINDINGS: Normal heart size, mediastinal contours, and pulmonary vascularity.

Chronic accentuation of interstitial markings, not significantly
changed.

No segmental consolidation, pleural effusion, or pneumothorax.

Advanced LEFT glenohumeral degenerative changes.
IMPRESSION: Chronic interstitial prominence, unchanged.

No new intrathoracic abnormalities.

## 2023-01-16 MED ORDER — ZOLEDRONIC ACID 4 MG/100ML IV SOLN
4.0000 mg | Freq: Once | INTRAVENOUS | Status: AC
  Administered 2023-01-16: 4 mg via INTRAVENOUS
  Filled 2023-01-16: qty 100

## 2023-01-16 MED ORDER — SODIUM CHLORIDE 0.9 % IV SOLN
150.0000 mg | Freq: Once | INTRAVENOUS | Status: AC
  Administered 2023-01-16: 150 mg via INTRAVENOUS
  Filled 2023-01-16: qty 150

## 2023-01-16 MED ORDER — HEPARIN SOD (PORK) LOCK FLUSH 100 UNIT/ML IV SOLN
500.0000 [IU] | Freq: Once | INTRAVENOUS | Status: AC | PRN
  Administered 2023-01-16: 500 [IU]

## 2023-01-16 MED ORDER — SODIUM CHLORIDE 0.9 % IV SOLN
683.5000 mg | Freq: Once | INTRAVENOUS | Status: AC
  Administered 2023-01-16: 680 mg via INTRAVENOUS
  Filled 2023-01-16: qty 68

## 2023-01-16 MED ORDER — SODIUM CHLORIDE 0.9 % IV SOLN
10.0000 mg | Freq: Once | INTRAVENOUS | Status: AC
  Administered 2023-01-16: 10 mg via INTRAVENOUS
  Filled 2023-01-16: qty 10

## 2023-01-16 MED ORDER — SODIUM CHLORIDE 0.9% FLUSH
10.0000 mL | Freq: Once | INTRAVENOUS | Status: AC
  Administered 2023-01-16: 10 mL

## 2023-01-16 MED ORDER — SODIUM CHLORIDE 0.9 % IV SOLN
500.0000 mg/m2 | Freq: Once | INTRAVENOUS | Status: AC
  Administered 2023-01-16: 1000 mg via INTRAVENOUS
  Filled 2023-01-16: qty 40

## 2023-01-16 MED ORDER — SODIUM CHLORIDE 0.9 % IV SOLN: Freq: Once | INTRAVENOUS | Status: AC

## 2023-01-16 MED ORDER — SODIUM CHLORIDE 0.9 % IV SOLN
200.0000 mg | Freq: Once | INTRAVENOUS | Status: AC
  Administered 2023-01-16: 200 mg via INTRAVENOUS
  Filled 2023-01-16: qty 200

## 2023-01-16 MED ORDER — PALONOSETRON HCL INJECTION 0.25 MG/5ML
0.2500 mg | Freq: Once | INTRAVENOUS | Status: AC
  Administered 2023-01-16: 0.25 mg via INTRAVENOUS
  Filled 2023-01-16: qty 5

## 2023-01-16 MED ORDER — SODIUM CHLORIDE 0.9% FLUSH
10.0000 mL | INTRAVENOUS | Status: DC | PRN
  Administered 2023-01-16: 10 mL

## 2023-01-16 NOTE — Patient Instructions (Signed)
[facility]  Discharge Instructions: Thank you for choosing [facility] to provide your oncology and hematology care.   If you have a lab appointment with the Cancer Center, please go directly to the Cancer Center and check in at the registration area.   Wear comfortable clothing and clothing appropriate for easy access to any Portacath or PICC line.   We strive to give you quality time with your provider. You may need to reschedule your appointment if you arrive late (15 or more minutes).  Arriving late affects you and other patients whose appointments are after yours.  Also, if you miss three or more appointments without notifying the office, you may be dismissed from the clinic at the provider's discretion.      For prescription refill requests, have your pharmacy contact our office and allow 72 hours for refills to be completed.    Today you received the following chemotherapy and/or immunotherapy agents: Keytruda, Alimta, Paraplatin      To help prevent nausea and vomiting after your treatment, we encourage you to take your nausea medication as directed.  BELOW ARE SYMPTOMS THAT SHOULD BE REPORTED IMMEDIATELY: *FEVER GREATER THAN 100.4 F (38 C) OR HIGHER *CHILLS OR SWEATING *NAUSEA AND VOMITING THAT IS NOT CONTROLLED WITH YOUR NAUSEA MEDICATION *UNUSUAL SHORTNESS OF BREATH *UNUSUAL BRUISING OR BLEEDING *URINARY PROBLEMS (pain or burning when urinating, or frequent urination) *BOWEL PROBLEMS (unusual diarrhea, constipation, pain near the anus) TENDERNESS IN MOUTH AND THROAT WITH OR WITHOUT PRESENCE OF ULCERS (sore throat, sores in mouth, or a toothache) UNUSUAL RASH, SWELLING OR PAIN  UNUSUAL VAGINAL DISCHARGE OR ITCHING   Items with * indicate a potential emergency and should be followed up as soon as possible or go to the Emergency Department if any problems should occur.  Please show the CHEMOTHERAPY ALERT CARD or IMMUNOTHERAPY  ALERT CARD at check-in to the Emergency Department and triage nurse.  Should you have questions after your visit or need to cancel or reschedule your appointment, please contact [facility]  Dept: [phone]  and follow the prompts.  Office hours are 8:00 a.m. to 4:30 p.m. Monday - Friday. Please note that voicemails left after 4:00 p.m. may not be returned until the following business day.  We are closed weekends and major holidays. You have access to a nurse at all times for urgent questions. Please call the main number to the clinic Dept: [phone] and follow the prompts.   For any non-urgent questions, you may also contact your provider using MyChart. We now offer e-Visits for anyone 18 and older to request care online for non-urgent symptoms. For details visit [email or web address].   Also download the MyChart app! Go to the app store, search "MyChart", open the app, select [facility], and log in with your MyChart username and password.   

## 2023-01-16 NOTE — Progress Notes (Signed)
[facility] [phone]   [phone]  OFFICE PROGRESS NOTE  Maxwell Harries, MD [address] [place] [address]  DIAGNOSIS:  1) Stage IV (T3, N2, M1 C) non-small cell lung cancer, adenocarcinoma presented with large right upper lobe lung mass in addition to right hilar and subcarinal lymphadenopathy in addition to multiple metastatic bone lesions involving the pelvis as well as the lower thoracic and lumbar spines in addition to metastatic disease to the pelvic muscles diagnosed in February 2024.  2) deep venous thrombosis of the right lower extremity diagnosed on December 18, 2022  Detected Alteration(s) / Biomarker(s) Associated FDA-approved therapies Clinical Trial Availability% cfDNA or Amplification  KRAS G12C approved by FDA Adagrasib, Sotorasib Yes 29.3%  CDK4 Amplification None Yes High (+++)Plasma Copy Number: 8.0  PD-L1 expression 98%  PRIOR THERAPY: Palliative radiotherapy to the painful bone metastasis under the care of Dr. Mitzi Hansen  CURRENT THERAPY:  1) Systemic chemotherapy with carboplatin for AUC of 5, Alimta 500 Mg/M2 and Keytruda 200 Mg IV every 3 weeks.  First dose November 14, 2022.  Status post 3 cycles. 2) Eliquis 10 mg p.o. twice daily for 7 days followed by 5 mg p.o. twice daily.  First dose 12/26/2022.  INTERVAL HISTORY: Maxwell Grant 57 y.o. male returns to the clinic today for follow-up visit accompanied by his Sister Maxwell Grant.  The patient is feeling fine today with no concerning complaints except for the left hip pain.  He denied having any current chest pain, shortness of breath except with exertion with no cough or hemoptysis.  He has no nausea, vomiting, diarrhea or constipation.  He has no headache or visual changes.  He denied having any significant weight loss or night sweats.  He has been tolerating his treatment with chemotherapy fairly well.  He is here today for evaluation and repeat CT scan of the  chest, abdomen and pelvis for restaging of his disease.  MEDICAL HISTORY: Past Medical History:  Diagnosis Date   CHF (congestive heart failure) (HCC)    COPD (chronic obstructive pulmonary disease) (HCC)    Diabetes mellitus without complication (HCC)    Dyspnea    Hypertension    Lung cancer (HCC) 10/2022   Right leg DVT (HCC) 12/21/2022    ALLERGIES:  is allergic to statins, aspirin, flexeril [cyclobenzaprine], and penicillins.  MEDICATIONS:  Current Outpatient Medications  Medication Sig Dispense Refill   Accu-Chek FastClix Lancets MISC Use to test blood sugar 4 times a day 102 each 0   albuterol (VENTOLIN HFA) 108 (90 Base) MCG/ACT inhaler Inhale 1-2 puffs into the lungs every 6 (six) hours as needed for wheezing or shortness of breath. 18 g 3   APIXABAN (ELIQUIS) VTE STARTER PACK (10MG  AND 5MG ) Take as directed on package: start with two-5mg  tablets twice daily for 7 days. On day 8, switch to one-5mg  tablet twice daily. 74 each 0   Fluticasone-Umeclidin-Vilant (TRELEGY ELLIPTA) 100-62.5-25 MCG/ACT AEPB Inhale 1 puff into the lungs daily in the afternoon. 60 each 5   folic acid (FOLVITE) 1 MG tablet Take 1 tablet (1 mg total) by mouth daily. 30 tablet 4   glimepiride (AMARYL) 2 MG tablet Take 2 mg by mouth in the morning.     oxyCODONE (OXYCONTIN) 20 mg 12 hr tablet Take 1 tablet (20 mg total) by mouth every 12 (twelve) hours. 60 tablet 0   oxyCODONE-acetaminophen (PERCOCET) 7.5-325 MG tablet Take 1 tablet by mouth every 6 hours  as needed for severe pain. 60 tablet 0   lidocaine-prilocaine (EMLA) cream Apply to [facility] Prior to Access as needed (Patient not taking: Reported on 01/16/2023) 30 g 2   meloxicam (MOBIC) 7.5 MG tablet Take 1 tablet (7.5 mg total) by mouth daily. 30 tablet 0   prochlorperazine (COMPAZINE) 10 MG tablet Take 1 tablet (10 mg total) by mouth every 6 (six) hours as needed for nausea or vomiting. (Patient not taking: Reported on 01/16/2023) 30 tablet 0   No current  facility-administered medications for this visit.    SURGICAL HISTORY:  Past Surgical History:  Procedure Laterality Date   BRONCHIAL NEEDLE ASPIRATION BIOPSY  10/30/2022   Procedure: BRONCHIAL NEEDLE ASPIRATION BIOPSIES;  Surgeon: Leslye Peer, MD;  Location: [facility] ENDOSCOPY;  Service: Cardiopulmonary;;   IR IMAGING GUIDED PORT INSERTION  11/17/2022   NO PAST SURGERIES     RADIOLOGY WITH ANESTHESIA  10/30/2022   Procedure: MRI WITH ANESTHESIA W/WO CONTRAST;  Surgeon: Leslye Peer, MD;  Location: [facility] ENDOSCOPY;  Service: Cardiopulmonary;;   VIDEO BRONCHOSCOPY WITH ENDOBRONCHIAL ULTRASOUND Right 10/30/2022   Procedure: VIDEO BRONCHOSCOPY WITH ENDOBRONCHIAL ULTRASOUND;  Surgeon: Leslye Peer, MD;  Location: [facility] ENDOSCOPY;  Service: Cardiopulmonary;  Laterality: Right;    REVIEW OF SYSTEMS:  Constitutional: positive for fatigue Eyes: negative Ears, nose, mouth, throat, and face: negative Respiratory: negative Cardiovascular: negative Gastrointestinal: negative Genitourinary:negative Integument/breast: negative Hematologic/lymphatic: negative Musculoskeletal:positive for bone pain Neurological: negative Behavioral/Psych: negative Endocrine: negative Allergic/Immunologic: negative   PHYSICAL EXAMINATION: General appearance: alert, cooperative, and no distress Head: Normocephalic, without obvious abnormality, atraumatic Neck: no adenopathy, no JVD, supple, symmetrical, trachea midline, and thyroid not enlarged, symmetric, no tenderness/mass/nodules Lymph nodes: Cervical, supraclavicular, and axillary nodes normal. Resp: clear to auscultation bilaterally Back: symmetric, no curvature. ROM normal. No CVA tenderness. Cardio: regular rate and rhythm, S1, S2 normal, no murmur, click, rub or gallop GI: soft, non-tender; bowel sounds normal; no masses,  no organomegaly Extremities: extremities normal, atraumatic, no cyanosis or edema Neurologic: Alert and oriented X 3, normal strength and  tone. Normal symmetric reflexes. Normal coordination and gait  ECOG PERFORMANCE STATUS: 1 - Symptomatic but completely ambulatory  Blood pressure (!) 151/82, pulse 70, temperature 98.1 F (36.7 C), temperature source Temporal, resp. rate 14, weight 189 lb 11.2 oz (86 kg), SpO2 90 %.  LABORATORY DATA: Lab Results  Component Value Date   WBC 3.2 (L) 01/16/2023   HGB 11.2 (L) 01/16/2023   HCT 34.6 (L) 01/16/2023   MCV 95.8 01/16/2023   PLT 157 01/16/2023      Chemistry      Component Value Date/Time   NA 139 01/16/2023 1028   K 4.1 01/16/2023 1028   CL 104 01/16/2023 1028   CO2 32 01/16/2023 1028   BUN 13 01/16/2023 1028   CREATININE 0.74 01/16/2023 1028      Component Value Date/Time   CALCIUM 9.4 01/16/2023 1028   ALKPHOS 119 01/16/2023 1028   AST 13 (L) 01/16/2023 1028   ALT 9 01/16/2023 1028   BILITOT 0.5 01/16/2023 1028       RADIOGRAPHIC STUDIES: CT Chest W Contrast  Result Date: 01/16/2023 CLINICAL DATA:  Restaging metastatic non-small cell lung cancer. Undergoing chemotherapy and radiation therapy. * Tracking Code: BO * EXAM: CT CHEST, ABDOMEN, AND PELVIS WITH CONTRAST TECHNIQUE: Multidetector CT imaging of the chest, abdomen and pelvis was performed following the standard protocol during bolus administration of intravenous contrast. RADIATION DOSE REDUCTION: This exam was performed according to the departmental  dose-optimization program which includes automated exposure control, adjustment of the mA and/or kV according to patient size and/or use of iterative reconstruction technique. CONTRAST:  OMNIPAQUE IOHEXOL 300 MG/ML  SOLN COMPARISON:  CT scan 10/17/2022 FINDINGS: CT CHEST FINDINGS Cardiovascular: The heart is normal in size. No pericardial effusion. Stable age advanced atherosclerotic calcifications involving the aorta and branch vessels including three-vessel coronary artery calcifications. Mediastinum/Nodes: Interval decrease in size of mediastinal and  right hilar lymph nodes. The 2 cm right hilar nodes seen on the prior study now measures 10 mm. 14 mm subcarinal node now measures 8 mm. 9 mm right paratracheal node now measures 5 mm. No new or progressive findings. Lungs/Pleura: Right upper lobe lung mass measures 4.3 x 4.0 cm and previously measured 5.6 x 5.5 cm. Stable underlying advanced emphysematous changes and pulmonary scarring. A few small sub 5 mm subpleural nodules are stable and likely lymph nodes. New nodular density in the lingula is adjacent to a healing metastatic rib lesion and could be related to radiation change. Attention on follow-up scans is suggested. No findings suspicious for pulmonary metastatic disease. Musculoskeletal: Multiple healing rib lesions with callus formation and moderate sclerosis. New compression fractures involving the T8, T9, T10 and T11 vertebral bodies with some sclerotic change suggesting healing pathologic fractures. CT ABDOMEN PELVIS FINDINGS Hepatobiliary: No hepatic lesions to suggest metastatic disease. No intrahepatic biliary dilatation. The gallbladder is unremarkable. No common bile duct dilatation. Pancreas: No mass, inflammation or ductal dilatation. Spleen: Normal size.  No focal lesions. Adrenals/Urinary Tract: Interval decrease in size of the left adrenal gland lesion suggesting a response to treatment. It previously measured 21 mm and now measures 14 mm. No right-sided adrenal gland lesions. Simple bilateral renal cysts not requiring any further imaging evaluation or follow-up. Stomach/Bowel: The stomach, duodenum, small bowel and colon are grossly normal without oral contrast. No inflammatory changes, mass lesions or obstructive findings. The appendix is normal. Vascular/Lymphatic: Stable significant age advanced vascular disease but no aneurysm. Stable scattered upper abdominal lymph nodes. No retroperitoneal or pelvic adenopathy. No inguinal adenopathy. Reproductive: The prostate gland and seminal  vesicles are unremarkable. Other: No pelvic mass or adenopathy. No free pelvic fluid collections. No inguinal mass or adenopathy. No abdominal wall hernia or subcutaneous lesions. Musculoskeletal: Extensive metastatic disease involving the left iliac bone but interval response to treatment with healing pathologic fractures. Decrease in size of the associated tumor involving the left iliacus and gluteal muscles. Right iliac bone lesion also demonstrates some healing changes. IMPRESSION: 1. Interval decrease in size of the right upper lobe lung mass and mediastinal/right hilar adenopathy. 2. Interval decrease in size of the left adrenal gland lesion suggesting a response to treatment. 3. Extensive metastatic disease involving the left iliac bone but interval response to treatment with healing pathologic fractures. Decrease in size of the associated tumor involving the left iliacus and gluteal muscles. 4. New compression fractures involving the T8, T9, T10 and T11 vertebral bodies with some sclerotic change suggesting healing pathologic fractures. 5. New nodular density in the lingula is adjacent to a healing metastatic rib lesion and could be related to radiation change. Attention on follow-up scans is suggested. 6. Stable advanced emphysematous changes and pulmonary scarring. 7. Stable age advanced vascular disease. Aortic Atherosclerosis (ICD10-I70.0) and Emphysema (ICD10-J43.9). Electronically Signed   By: Rudie Meyer M.D.   On: 01/16/2023 10:46   CT Abdomen Pelvis W Contrast  Result Date: 01/16/2023 CLINICAL DATA:  Restaging metastatic non-small cell lung cancer. Undergoing chemotherapy and  radiation therapy. * Tracking Code: BO * EXAM: CT CHEST, ABDOMEN, AND PELVIS WITH CONTRAST TECHNIQUE: Multidetector CT imaging of the chest, abdomen and pelvis was performed following the standard protocol during bolus administration of intravenous contrast. RADIATION DOSE REDUCTION: This exam was performed according to  the departmental dose-optimization program which includes automated exposure control, adjustment of the mA and/or kV according to patient size and/or use of iterative reconstruction technique. CONTRAST:  OMNIPAQUE IOHEXOL 300 MG/ML  SOLN COMPARISON:  CT scan 10/17/2022 FINDINGS: CT CHEST FINDINGS Cardiovascular: The heart is normal in size. No pericardial effusion. Stable age advanced atherosclerotic calcifications involving the aorta and branch vessels including three-vessel coronary artery calcifications. Mediastinum/Nodes: Interval decrease in size of mediastinal and right hilar lymph nodes. The 2 cm right hilar nodes seen on the prior study now measures 10 mm. 14 mm subcarinal node now measures 8 mm. 9 mm right paratracheal node now measures 5 mm. No new or progressive findings. Lungs/Pleura: Right upper lobe lung mass measures 4.3 x 4.0 cm and previously measured 5.6 x 5.5 cm. Stable underlying advanced emphysematous changes and pulmonary scarring. A few small sub 5 mm subpleural nodules are stable and likely lymph nodes. New nodular density in the lingula is adjacent to a healing metastatic rib lesion and could be related to radiation change. Attention on follow-up scans is suggested. No findings suspicious for pulmonary metastatic disease. Musculoskeletal: Multiple healing rib lesions with callus formation and moderate sclerosis. New compression fractures involving the T8, T9, T10 and T11 vertebral bodies with some sclerotic change suggesting healing pathologic fractures. CT ABDOMEN PELVIS FINDINGS Hepatobiliary: No hepatic lesions to suggest metastatic disease. No intrahepatic biliary dilatation. The gallbladder is unremarkable. No common bile duct dilatation. Pancreas: No mass, inflammation or ductal dilatation. Spleen: Normal size.  No focal lesions. Adrenals/Urinary Tract: Interval decrease in size of the left adrenal gland lesion suggesting a response to treatment. It previously measured 21 mm and  now measures 14 mm. No right-sided adrenal gland lesions. Simple bilateral renal cysts not requiring any further imaging evaluation or follow-up. Stomach/Bowel: The stomach, duodenum, small bowel and colon are grossly normal without oral contrast. No inflammatory changes, mass lesions or obstructive findings. The appendix is normal. Vascular/Lymphatic: Stable significant age advanced vascular disease but no aneurysm. Stable scattered upper abdominal lymph nodes. No retroperitoneal or pelvic adenopathy. No inguinal adenopathy. Reproductive: The prostate gland and seminal vesicles are unremarkable. Other: No pelvic mass or adenopathy. No free pelvic fluid collections. No inguinal mass or adenopathy. No abdominal wall hernia or subcutaneous lesions. Musculoskeletal: Extensive metastatic disease involving the left iliac bone but interval response to treatment with healing pathologic fractures. Decrease in size of the associated tumor involving the left iliacus and gluteal muscles. Right iliac bone lesion also demonstrates some healing changes. IMPRESSION: 1. Interval decrease in size of the right upper lobe lung mass and mediastinal/right hilar adenopathy. 2. Interval decrease in size of the left adrenal gland lesion suggesting a response to treatment. 3. Extensive metastatic disease involving the left iliac bone but interval response to treatment with healing pathologic fractures. Decrease in size of the associated tumor involving the left iliacus and gluteal muscles. 4. New compression fractures involving the T8, T9, T10 and T11 vertebral bodies with some sclerotic change suggesting healing pathologic fractures. 5. New nodular density in the lingula is adjacent to a healing metastatic rib lesion and could be related to radiation change. Attention on follow-up scans is suggested. 6. Stable advanced emphysematous changes and pulmonary scarring. 7.  Stable age advanced vascular disease. Aortic Atherosclerosis  (ICD10-I70.0) and Emphysema (ICD10-J43.9). Electronically Signed   By: Rudie Meyer M.D.   On: 01/16/2023 10:46   ECHOCARDIOGRAM COMPLETE  Result Date: 12/21/2022    ECHOCARDIOGRAM REPORT   Patient Name:   Maxwell Grant Date of Exam: 12/21/2022 Medical Rec #:  [id number]      Height:       70.0 in Accession #:    [id number]     Weight:       185.1 lb Date of Birth:  19-Jun-1966       BSA:          2.020 m Patient Age:    56 years       BP:           121/73 mmHg Patient Gender: M              HR:           83 bpm. Exam Location:  Outpatient Procedure: 2D Echo, 3D Echo, Color Doppler, Cardiac Doppler and Strain Analysis Indications:    CHF  History:        Patient has prior history of Echocardiogram examinations, most                 recent 03/13/2021. COPD; Risk Factors:Hypertension, Current                 Smoker, Dyslipidemia and Diabetes. Mediastinal adenopathy.  Sonographer:    Jeryl Columbia RDCS Referring Phys: [id number] VINEET SOOD  Sonographer Comments: Suboptimal parasternal window. Limited imaging from left decubitus. Patient uncomfortable on left side. IMPRESSIONS  1. Left ventricular ejection fraction by 3D volume is 55 %. The left ventricle has normal function. The left ventricle has no regional wall motion abnormalities. Left ventricular diastolic parameters were normal.  2. Right ventricular systolic function is normal. The right ventricular size is mildly enlarged.  3. The mitral valve is normal in structure. Trivial mitral valve regurgitation. No evidence of mitral stenosis.  4. The aortic valve is normal in structure. Aortic valve regurgitation is trivial. No aortic stenosis is present.  5. The inferior vena cava is normal in size with greater than 50% respiratory variability, suggesting right atrial pressure of 3 mmHg. FINDINGS  Left Ventricle: Left ventricular ejection fraction by 3D volume is 55 %. The left ventricle has normal function. The left ventricle has no regional wall motion abnormalities.  Global longitudinal strain performed but not reported based on interpreter judgement due to suboptimal tracking. The left ventricular internal cavity size was normal in size. There is no left ventricular hypertrophy. Left ventricular diastolic parameters were normal. Right Ventricle: The right ventricular size is mildly enlarged. No increase in right ventricular wall thickness. Right ventricular systolic function is normal. Left Atrium: Left atrial size was normal in size. Right Atrium: Right atrial size was normal in size. Pericardium: There is no evidence of pericardial effusion. Mitral Valve: The mitral valve is normal in structure. Trivial mitral valve regurgitation. No evidence of mitral valve stenosis. Tricuspid Valve: The tricuspid valve is normal in structure. Tricuspid valve regurgitation is trivial. No evidence of tricuspid stenosis. Aortic Valve: The aortic valve is normal in structure. Aortic valve regurgitation is trivial. No aortic stenosis is present. Pulmonic Valve: The pulmonic valve was normal in structure. Pulmonic valve regurgitation is not visualized. No evidence of pulmonic stenosis. Aorta: The aortic root is normal in size and structure. Venous: The inferior vena cava is normal in size with greater  than 50% respiratory variability, suggesting right atrial pressure of 3 mmHg. IAS/Shunts: No atrial level shunt detected by color flow Doppler.  LEFT VENTRICLE PLAX 2D LVIDd:         4.66 cm         Diastology LVIDs:         3.51 cm         LV e' medial:    10.00 cm/s LV PW:         1.02 cm         LV E/e' medial:  7.4 LV IVS:        1.00 cm         LV e' lateral:   16.60 cm/s LVOT diam:     1.80 cm         LV E/e' lateral: 4.5 LVOT Area:     2.54 cm                                 3D Volume EF LV Volumes (MOD)               LV 3D EF:    Left LV vol d, MOD    96.2 ml                    ventricul A2C:                                        ar LV vol d, MOD    94.5 ml                    ejection A4C:                                         fraction LV vol s, MOD    31.3 ml                    by 3D A2C:                                        volume is LV vol s, MOD    43.4 ml                    55 %. A4C: LV SV MOD A2C:   64.9 ml LV SV MOD A4C:   94.5 ml       3D Volume EF: LV SV MOD BP:    60.5 ml       3D EF:        55 %                                LV EDV:       145 ml                                LV ESV:       65 ml  LV SV:        80 ml RIGHT VENTRICLE RV Basal diam:  4.33 cm RV Mid diam:    3.99 cm TAPSE (M-mode): 3.4 cm LEFT ATRIUM             Index        RIGHT ATRIUM           Index LA diam:        3.80 cm 1.88 cm/m   RA Area:     14.90 cm LA Vol (A2C):   43.5 ml 21.54 ml/m  RA Volume:   38.90 ml  19.26 ml/m LA Vol (A4C):   31.7 ml 15.69 ml/m LA Biplane Vol: 38.4 ml 19.01 ml/m   AORTA Ao Root diam: 3.00 cm MITRAL VALVE MV Area (PHT): 3.34 cm    SHUNTS MV Decel Time: 227 msec    Systemic Diam: 1.80 cm MV E velocity: 74.40 cm/s MV A velocity: 51.90 cm/s MV E/A ratio:  1.43 Kardie Tobb DO Electronically signed by Thomasene Ripple DO Signature Date/Time: 12/21/2022/1:46:06 PM    Final    US Venous Img Lower Bilateral (DVT)  Result Date: 12/21/2022 CLINICAL DATA:  leg swelling EXAM: BILATERAL LOWER EXTREMITY VENOUS DOPPLER ULTRASOUND TECHNIQUE: Gray-scale sonography with graded compression, as well as color Doppler and duplex ultrasound were performed to evaluate the lower extremity deep venous systems from the level of the common femoral vein and including the common femoral, femoral, profunda femoral, popliteal and calf veins including the posterior tibial, peroneal and gastrocnemius veins when visible. The superficial great saphenous vein was also interrogated. Spectral Doppler was utilized to evaluate flow at rest and with distal augmentation maneuvers in the common femoral, femoral and popliteal veins. COMPARISON:  MR pelvis, 11/07/2022. FINDINGS: RIGHT LOWER EXTREMITY  VENOUS Normal compressibility of the RIGHT common femoral, superficial femoral, and popliteal veins. Visualized portions of profunda femoral vein and great saphenous vein unremarkable. Heterogeneously echogenic, near-occlusive filling defects within the imaged portions of the calf veins, specifically within the posterior tibial and peroneal veins. OTHER No evidence of superficial thrombophlebitis or abnormal fluid collection. Limitations: none LEFT LOWER EXTREMITY VENOUS Normal compressibility of the LEFT common femoral, superficial femoral, and popliteal veins, as well as the visualized calf veins. Visualized portions of profunda femoral vein and great saphenous vein unremarkable. No filling defects to suggest DVT on grayscale or color Doppler imaging. Doppler waveforms show normal direction of venous flow, normal respiratory plasticity and response to augmentation. OTHER No evidence of superficial thrombophlebitis or abnormal fluid collection. Limitations: none IMPRESSION: 1. Examination is POSITIVE for RIGHT lower extremity DVT, within the calf veins, as above. 2. No evidence of femoropopliteal DVT or superficial thrombophlebitis within the LEFT lower extremity. Roanna Banning, MD [facility] Electronically Signed   By: Roanna Banning M.D.   On: 12/21/2022 10:03    ASSESSMENT AND PLAN: This is a very pleasant 57 years old white male with stage IV (T3, N2, M1 C) non-small cell lung cancer, adenocarcinoma presented with large right upper lobe lung mass in addition to right hilar and subcarinal lymphadenopathy in addition to multiple metastatic bone lesions involving the pelvis as well as the lower thoracic and lumbar spines in addition to metastatic disease to the pelvic muscles diagnosed in February 2024.  Molecular studies showed positive KRAS G12C mutation and PD-L1 expression of 98%. The patient is here today to start the first cycle of systemic  chemotherapy with carboplatin for AUC of 5, Alimta  500 Mg/M2 and Keytruda 200 Mg IV every 3 weeks.  First dose November 14, 2022.  Status post 3 cycles.   The patient has been tolerating his treatment fairly well with no concerning adverse effects. He had repeat CT scan of the chest, abdomen and pelvis performed recently.  I personally and independently reviewed the scan and discussed the result with the patient and his sister. His scan showed improvement of his disease. I recommended for him to continue his current treatment and he will proceed with cycle #4 today. I will see the patient back for follow-up visit in 3 weeks for evaluation before the next cycle of his treatment. For the pain management he is followed by the palliative care team. For the right lower extremity deep venous thrombosis, I strongly encouraged the patient to start his treatment with Eliquis today once he goes home. The patient was advised to call immediately if he has any other concerning symptoms in the interval. The patient voices understanding of current disease status and treatment options and is in agreement with the current care plan.  All questions were answered. The patient knows to call the clinic with any problems, questions or concerns. We can certainly see the patient much sooner if necessary.  The total time spent in the appointment was 30 minutes.  Disclaimer: This note was dictated with voice recognition software. Similar sounding words can inadvertently be transcribed and may not be corrected upon review.

## 2023-01-16 NOTE — Patient Instructions (Signed)
Steps to Quit Smoking Smoking tobacco is the leading cause of preventable death. It can affect almost every organ in the body. Smoking puts you and people around you at risk for many serious, long-lasting (chronic) diseases. Quitting smoking can be hard, but it is one of the best things that you can do for your health. It is never too late to quit. Do not give up if you cannot quit the first time. Some people need to try many times to quit. Do your best to stick to your quit plan, and talk with your doctor if you have any questions or concerns. How do I get ready to quit? Pick a date to quit. Set a date within the next 2 weeks to give you time to prepare. Write down the reasons why you are quitting. Keep this list in places where you will see it often. Tell your family, friends, and co-workers that you are quitting. Their support is important. Talk with your doctor about the choices that may help you quit. Find out if your health insurance will pay for these treatments. Know the people, places, things, and activities that make you want to smoke (triggers). Avoid them. What first steps can I take to quit smoking? Throw away all cigarettes at home, at work, and in your car. Throw away the things that you use when you smoke, such as ashtrays and lighters. Clean your car. Empty the ashtray. Clean your home, including curtains and carpets. What can I do to help me quit smoking? Talk with your doctor about taking medicines and seeing a counselor. You are more likely to succeed when you do both. If you are pregnant or breastfeeding: Talk with your doctor about counseling or other ways to quit smoking. Do not take medicine to help you quit smoking unless your doctor tells you to. Quit right away Quit smoking completely, instead of slowly cutting back on how much you smoke over a period of time. Stopping smoking right away may be more successful than slowly quitting. Go to counseling. In-person is best  if this is an option. You are more likely to quit if you go to counseling sessions regularly. Take medicine You may take medicines to help you quit. Some medicines need a prescription, and some you can buy over-the-counter. Some medicines may contain a drug called nicotine to replace the nicotine in cigarettes. Medicines may: Help you stop having the desire to smoke (cravings). Help to stop the problems that come when you stop smoking (withdrawal symptoms). Your doctor may ask you to use: Nicotine patches, gum, or lozenges. Nicotine inhalers or sprays. Non-nicotine medicine that you take by mouth. Find resources Find resources and other ways to help you quit smoking and remain smoke-free after you quit. They include: Online chats with a counselor. Phone quitlines. Printed self-help materials. Support groups or group counseling. Text messaging programs. Mobile phone apps. Use apps on your mobile phone or tablet that can help you stick to your quit plan. Examples of free services include Quit Guide from the CDC and smokefree.gov  What can I do to make it easier to quit?  Talk to your family and friends. Ask them to support and encourage you. Call a phone quitline, such as 1-800-QUIT-NOW, reach out to support groups, or work with a counselor. Ask people who smoke to not smoke around you. Avoid places that make you want to smoke, such as: Bars. Parties. Smoke-break areas at work. Spend time with people who do not smoke. Lower   the stress in your life. Stress can make you want to smoke. Try these things to lower stress: Getting regular exercise. Doing deep-breathing exercises. Doing yoga. Meditating. What benefits will I see if I quit smoking? Over time, you may have: A better sense of smell and taste. Less coughing and sore throat. A slower heart rate. Lower blood pressure. Clearer skin. Better breathing. Fewer sick days. Summary Quitting smoking can be hard, but it is one of  the best things that you can do for your health. Do not give up if you cannot quit the first time. Some people need to try many times to quit. When you decide to quit smoking, make a plan to help you succeed. Quit smoking right away, not slowly over a period of time. When you start quitting, get help and support to keep you smoke-free. This information is not intended to replace advice given to you by your health care provider. Make sure you discuss any questions you have with your health care provider. Document Revised: 08/26/2021 Document Reviewed: 08/26/2021 Elsevier Patient Education  2023 Elsevier Inc.  

## 2023-01-16 NOTE — Progress Notes (Signed)
Patient seen by Dr. Mohamed  Vitals are within treatment parameters.  Labs reviewed: and are within treatment parameters.  Per physician team, patient is ready for treatment and there are NO modifications to the treatment plan.  

## 2023-01-17 ENCOUNTER — Other Ambulatory Visit (HOSPITAL_COMMUNITY): Payer: Self-pay

## 2023-01-17 ENCOUNTER — Encounter: Payer: Self-pay | Admitting: Internal Medicine

## 2023-01-17 ENCOUNTER — Encounter: Payer: Self-pay | Admitting: Nurse Practitioner

## 2023-01-17 MED ORDER — MELOXICAM 7.5 MG PO TABS
7.5000 mg | ORAL_TABLET | Freq: Every day | ORAL | 2 refills | Status: DC
Start: 2023-01-17 — End: 2023-05-04
  Filled 2023-01-17: qty 30, 30d supply, fill #0
  Filled 2023-02-12: qty 30, 30d supply, fill #1
  Filled 2023-03-14: qty 30, 30d supply, fill #2

## 2023-01-19 ENCOUNTER — Other Ambulatory Visit: Payer: Self-pay

## 2023-01-19 DIAGNOSIS — G4733 Obstructive sleep apnea (adult) (pediatric): Secondary | ICD-10-CM

## 2023-01-19 DIAGNOSIS — J9611 Chronic respiratory failure with hypoxia: Secondary | ICD-10-CM

## 2023-01-19 NOTE — Telephone Encounter (Signed)
Order placed to have 3L bled through pt NIV @ night

## 2023-01-23 ENCOUNTER — Telehealth: Payer: Self-pay

## 2023-01-23 ENCOUNTER — Other Ambulatory Visit (HOSPITAL_COMMUNITY): Payer: Self-pay

## 2023-01-23 NOTE — Telephone Encounter (Signed)
Patient Advocate Encounter   Received notification from [facility] that prior authorization is required for Trelegy Ellipta 100-62.5-25MCG/ACT aerosol powder   Submitted: 01-23-2023 Key [id number]  Status is pending

## 2023-01-27 ENCOUNTER — Other Ambulatory Visit: Payer: Self-pay | Admitting: Internal Medicine

## 2023-01-28 ENCOUNTER — Encounter: Payer: Self-pay | Admitting: Internal Medicine

## 2023-01-31 ENCOUNTER — Encounter: Payer: Self-pay | Admitting: Pulmonary Disease

## 2023-02-02 NOTE — Progress Notes (Unsigned)
Palliative Medicine [facility]  [phone] [phone]   Name: Maxwell Grant Date: 02/02/2023 MRN: [id number]  DOB: 1966-04-05  Patient Care Team: Tracey Harries, MD as PCP - General (Family Medicine)    INTERVAL HISTORY: Maxwell Grant is a 57 y.o. male with  oncologic medical history including new diagnosed non-small cell lung cancer (10/2022), as well as a history of COPD, diabetes mellitus, hypertension, dyslipidemia as well as history of osteomyelitis of the back.  Palliative ask to see for symptom and pain management and goals of care.     SOCIAL HISTORY:    Maxwell Grant reports that he has been smoking cigarettes. He has been smoking an average of 2 packs per day. He has never used smokeless tobacco. He reports current alcohol use of about 1.0 standard drink of alcohol per week. He reports current drug use. Drug: Marijuana.  ADVANCE DIRECTIVES: none on file   CODE STATUS: Full Code  PAST MEDICAL HISTORY: Past Medical History:  Diagnosis Date   CHF (congestive heart failure) (HCC)    COPD (chronic obstructive pulmonary disease) (HCC)    Diabetes mellitus without complication (HCC)    Dyspnea    Hypertension    Lung cancer (HCC) 10/2022   Right leg DVT (HCC) 12/21/2022    ALLERGIES:  is allergic to statins, aspirin, flexeril [cyclobenzaprine], and penicillins.  MEDICATIONS:  Current Outpatient Medications  Medication Sig Dispense Refill   Accu-Chek FastClix Lancets MISC Use to test blood sugar 4 times a day 102 each 0   albuterol (VENTOLIN HFA) 108 (90 Base) MCG/ACT inhaler Inhale 1-2 puffs into the lungs every 6 (six) hours as needed for wheezing or shortness of breath. 18 g 3   APIXABAN (ELIQUIS) VTE STARTER PACK (10MG  AND 5MG ) Take as directed on package: start with two-5mg  tablets twice daily for 7 days. On day 8, switch to one-5mg  tablet twice daily. 74 each 0   Fluticasone-Umeclidin-Vilant (TRELEGY ELLIPTA) 100-62.5-25  MCG/ACT AEPB Inhale 1 puff into the lungs daily in the afternoon. 60 each 5   folic acid (FOLVITE) 1 MG tablet TAKE 1 TABLET BY MOUTH EVERY DAY 90 tablet 1   glimepiride (AMARYL) 2 MG tablet Take 2 mg by mouth in the morning.     lidocaine-prilocaine (EMLA) cream Apply to [facility] Prior to Access as needed (Patient not taking: Reported on 01/16/2023) 30 g 2   meloxicam (MOBIC) 7.5 MG tablet Take 1 tablet (7.5 mg total) by mouth daily. 30 tablet 2   oxyCODONE (OXYCONTIN) 20 mg 12 hr tablet Take 1 tablet (20 mg total) by mouth every 12 (twelve) hours. 60 tablet 0   oxyCODONE-acetaminophen (PERCOCET) 7.5-325 MG tablet Take 1 tablet by mouth every 6 hours as needed for severe pain. 60 tablet 0   prochlorperazine (COMPAZINE) 10 MG tablet Take 1 tablet (10 mg total) by mouth every 6 (six) hours as needed for nausea or vomiting. (Patient not taking: Reported on 01/16/2023) 30 tablet 0   No current facility-administered medications for this visit.    VITAL SIGNS: There were no vitals taken for this visit. There were no vitals filed for this visit.  Estimated body mass index is 30.62 kg/m as calculated from the following:   Height as of 01/01/23: 5\' 6"  (1.676 m).   Weight as of 01/16/23: 189 lb 11.2 oz (86 kg).   PERFORMANCE STATUS (ECOG) : 2 - Symptomatic, <50% confined to bed   Physical Exam General: NAD Cardiovascular: regular rate and rhythm  Pulmonary: normal breathing pattern  Abdomen: soft, nontender Extremities: bilateral lower extremity pitting edema, no joint deformities Skin: no rashes Neurological: drowsy, oriented x 4   IMPRESSION: I saw Maxwell Grant during his infusion. No acute distress noted. Sister present. He shares overall he is doing well. Appetite is great. He is trying to increase activity. Is walking more. Has been able to do some lawn work including mowing. He is much appreciative of this. Denies nausea, vomiting, constipation, or diarrhea.   Neoplasm related pain Mr.  Grant reports pain overall is well controlled. Some days are better than others. States he can tell when he is active as pain is more intense.   We discussed current regimen. He is taking OxyContin 20mg  very 12 hours and Percocet as needed for breakthrough pain. Not requiring around the clock. Mobic daily.   We will continue to closely monitor and adjust medications as needed.     We discussed the importance of continued conversation with family and their medical providers regarding overall plan of care and treatment options, ensuring decisions are within the context of the patients values and GOCs.  PLAN: OxyContin 20 mg every 12 hours.  Oxycodone 7.5/325 -15mg  every 6 hours as needed for breakthrough pain. Not requiring around the clock.  Mobic 7.5 mg daily. MiraLAX twice daily. Colace daily. Ongoing symptom management support and goals of care discussions. Palliative will plan to see patient back in 3-4 weeks in collaboration to other oncology appointments.  Knows to contact office sooner if needed.   Patient expressed understanding and was in agreement with this plan. He also understands that He can call the clinic at any time with any questions, concerns, or complaints.    Any controlled substances utilized were prescribed in the context of palliative care. PDMP has been reviewed.   Visit consisted of counseling and education dealing with the complex and emotionally intense issues of symptom management and palliative care in the setting of serious and potentially life-threatening illness.Greater than 50%  of this time was spent counseling and coordinating care related to the above assessment and plan.  [name], AGPCNP-BC  Palliative Medicine Team/[facility]  *Please note that this is a verbal dictation therefore any spelling or grammatical errors are due to the "[employer]agon [employer] One" system interpretation.

## 2023-02-04 ENCOUNTER — Other Ambulatory Visit: Payer: Self-pay | Admitting: Nurse Practitioner

## 2023-02-04 DIAGNOSIS — Z515 Encounter for palliative care: Secondary | ICD-10-CM

## 2023-02-04 DIAGNOSIS — G893 Neoplasm related pain (acute) (chronic): Secondary | ICD-10-CM

## 2023-02-04 DIAGNOSIS — C3490 Malignant neoplasm of unspecified part of unspecified bronchus or lung: Secondary | ICD-10-CM

## 2023-02-05 ENCOUNTER — Other Ambulatory Visit: Payer: Self-pay | Admitting: Pulmonary Disease

## 2023-02-05 ENCOUNTER — Other Ambulatory Visit (HOSPITAL_COMMUNITY): Payer: Self-pay

## 2023-02-05 MED ORDER — OXYCODONE-ACETAMINOPHEN 7.5-325 MG PO TABS
1.0000 | ORAL_TABLET | Freq: Four times a day (QID) | ORAL | 0 refills | Status: DC | PRN
Start: 2023-02-05 — End: 2023-02-27
  Filled 2023-02-05: qty 60, 15d supply, fill #0

## 2023-02-06 ENCOUNTER — Inpatient Hospital Stay: Payer: Medicaid Other

## 2023-02-06 ENCOUNTER — Other Ambulatory Visit (HOSPITAL_COMMUNITY): Payer: Self-pay

## 2023-02-06 ENCOUNTER — Encounter: Payer: Self-pay | Admitting: Nurse Practitioner

## 2023-02-06 ENCOUNTER — Inpatient Hospital Stay (HOSPITAL_BASED_OUTPATIENT_CLINIC_OR_DEPARTMENT_OTHER): Payer: Medicaid Other | Admitting: Nurse Practitioner

## 2023-02-06 ENCOUNTER — Encounter: Payer: Self-pay | Admitting: Medical Oncology

## 2023-02-06 ENCOUNTER — Inpatient Hospital Stay: Payer: Medicaid Other | Attending: Internal Medicine | Admitting: Internal Medicine

## 2023-02-06 VITALS — BP 145/82 | HR 74 | Temp 98.1°F | Resp 16 | Wt 188.9 lb

## 2023-02-06 VITALS — BP 135/77 | HR 77 | Resp 16

## 2023-02-06 DIAGNOSIS — J439 Emphysema, unspecified: Secondary | ICD-10-CM | POA: Diagnosis not present

## 2023-02-06 DIAGNOSIS — R0609 Other forms of dyspnea: Secondary | ICD-10-CM | POA: Insufficient documentation

## 2023-02-06 DIAGNOSIS — C7989 Secondary malignant neoplasm of other specified sites: Secondary | ICD-10-CM | POA: Diagnosis not present

## 2023-02-06 DIAGNOSIS — Z5112 Encounter for antineoplastic immunotherapy: Secondary | ICD-10-CM | POA: Insufficient documentation

## 2023-02-06 DIAGNOSIS — Z880 Allergy status to penicillin: Secondary | ICD-10-CM | POA: Insufficient documentation

## 2023-02-06 DIAGNOSIS — R5383 Other fatigue: Secondary | ICD-10-CM | POA: Insufficient documentation

## 2023-02-06 DIAGNOSIS — M25552 Pain in left hip: Secondary | ICD-10-CM | POA: Insufficient documentation

## 2023-02-06 DIAGNOSIS — Z888 Allergy status to other drugs, medicaments and biological substances status: Secondary | ICD-10-CM | POA: Insufficient documentation

## 2023-02-06 DIAGNOSIS — G893 Neoplasm related pain (acute) (chronic): Secondary | ICD-10-CM | POA: Diagnosis not present

## 2023-02-06 DIAGNOSIS — C7951 Secondary malignant neoplasm of bone: Secondary | ICD-10-CM | POA: Diagnosis not present

## 2023-02-06 DIAGNOSIS — Z7901 Long term (current) use of anticoagulants: Secondary | ICD-10-CM | POA: Diagnosis not present

## 2023-02-06 DIAGNOSIS — I700 Atherosclerosis of aorta: Secondary | ICD-10-CM | POA: Diagnosis not present

## 2023-02-06 DIAGNOSIS — C3490 Malignant neoplasm of unspecified part of unspecified bronchus or lung: Secondary | ICD-10-CM

## 2023-02-06 DIAGNOSIS — Z86718 Personal history of other venous thrombosis and embolism: Secondary | ICD-10-CM | POA: Diagnosis not present

## 2023-02-06 DIAGNOSIS — K5903 Drug induced constipation: Secondary | ICD-10-CM | POA: Diagnosis not present

## 2023-02-06 DIAGNOSIS — Z5111 Encounter for antineoplastic chemotherapy: Secondary | ICD-10-CM | POA: Insufficient documentation

## 2023-02-06 DIAGNOSIS — M898X9 Other specified disorders of bone, unspecified site: Secondary | ICD-10-CM | POA: Insufficient documentation

## 2023-02-06 DIAGNOSIS — Z79899 Other long term (current) drug therapy: Secondary | ICD-10-CM | POA: Insufficient documentation

## 2023-02-06 DIAGNOSIS — C3411 Malignant neoplasm of upper lobe, right bronchus or lung: Secondary | ICD-10-CM | POA: Diagnosis present

## 2023-02-06 DIAGNOSIS — Z886 Allergy status to analgesic agent status: Secondary | ICD-10-CM | POA: Diagnosis not present

## 2023-02-06 DIAGNOSIS — Z95828 Presence of other vascular implants and grafts: Secondary | ICD-10-CM

## 2023-02-06 DIAGNOSIS — Z515 Encounter for palliative care: Secondary | ICD-10-CM

## 2023-02-06 LAB — CBC WITH DIFFERENTIAL/PLATELET
Abs Immature Granulocytes: 0.01 10*3/uL (ref 0.00–0.07)
Basophils Absolute: 0 10*3/uL (ref 0.0–0.1)
Basophils Relative: 1 %
Eosinophils Absolute: 0.2 10*3/uL (ref 0.0–0.5)
Eosinophils Relative: 4 %
HCT: 34.3 % — ABNORMAL LOW (ref 39.0–52.0)
Hemoglobin: 11.5 g/dL — ABNORMAL LOW (ref 13.0–17.0)
Immature Granulocytes: 0 %
Lymphocytes Relative: 26 %
Lymphs Abs: 0.9 10*3/uL (ref 0.7–4.0)
MCH: 32.5 pg (ref 26.0–34.0)
MCHC: 33.5 g/dL (ref 30.0–36.0)
MCV: 96.9 fL (ref 80.0–100.0)
Monocytes Absolute: 0.5 10*3/uL (ref 0.1–1.0)
Monocytes Relative: 13 %
Neutro Abs: 2 10*3/uL (ref 1.7–7.7)
Neutrophils Relative %: 56 %
Platelets: 127 10*3/uL — ABNORMAL LOW (ref 150–400)
RBC: 3.54 MIL/uL — ABNORMAL LOW (ref 4.22–5.81)
RDW: 22.1 % — ABNORMAL HIGH (ref 11.5–15.5)
Smear Review: NORMAL
WBC: 3.5 10*3/uL — ABNORMAL LOW (ref 4.0–10.5)
nRBC: 0 % (ref 0.0–0.2)

## 2023-02-06 LAB — COMPREHENSIVE METABOLIC PANEL
ALT: 11 U/L (ref 0–44)
AST: 14 U/L — ABNORMAL LOW (ref 15–41)
Albumin: 4.1 g/dL (ref 3.5–5.0)
Alkaline Phosphatase: 111 U/L (ref 38–126)
Anion gap: 5 (ref 5–15)
BUN: 13 mg/dL (ref 6–20)
CO2: 31 mmol/L (ref 22–32)
Calcium: 9 mg/dL (ref 8.9–10.3)
Chloride: 104 mmol/L (ref 98–111)
Creatinine, Ser: 0.79 mg/dL (ref 0.61–1.24)
GFR, Estimated: >60 mL/min (ref >60)
Glucose, Bld: 63 mg/dL — ABNORMAL LOW (ref 70–99)
Potassium: 4.4 mmol/L (ref 3.5–5.1)
Sodium: 140 mmol/L (ref 135–145)
Total Bilirubin: 0.6 mg/dL (ref 0.3–1.2)
Total Protein: 6.7 g/dL (ref 6.5–8.1)

## 2023-02-06 MED ORDER — SODIUM CHLORIDE 0.9 % IV SOLN: Freq: Once | INTRAVENOUS | Status: AC

## 2023-02-06 MED ORDER — OXYCODONE HCL ER 20 MG PO T12A
20.0000 mg | EXTENDED_RELEASE_TABLET | Freq: Two times a day (BID) | ORAL | 0 refills | Status: DC
Start: 2023-02-06 — End: 2023-02-27
  Filled 2023-02-06: qty 60, 30d supply, fill #0

## 2023-02-06 MED ORDER — HEPARIN SOD (PORK) LOCK FLUSH 100 UNIT/ML IV SOLN
500.0000 [IU] | Freq: Once | INTRAVENOUS | Status: AC | PRN
  Administered 2023-02-06: 500 [IU]

## 2023-02-06 MED ORDER — APIXABAN 5 MG PO TABS
5.0000 mg | ORAL_TABLET | Freq: Two times a day (BID) | ORAL | 2 refills | Status: DC
  Filled 2023-02-06: qty 60, 30d supply, fill #0
  Filled 2023-03-09 – 2023-03-12 (×2): qty 60, 30d supply, fill #1
  Filled 2023-04-06: qty 60, 30d supply, fill #2

## 2023-02-06 MED ORDER — SODIUM CHLORIDE 0.9% FLUSH
10.0000 mL | INTRAVENOUS | Status: DC | PRN
  Administered 2023-02-06: 10 mL

## 2023-02-06 MED ORDER — SODIUM CHLORIDE 0.9% FLUSH
10.0000 mL | Freq: Once | INTRAVENOUS | Status: AC
  Administered 2023-02-06: 10 mL

## 2023-02-06 MED ORDER — SODIUM CHLORIDE 0.9 % IV SOLN
500.0000 mg/m2 | Freq: Once | INTRAVENOUS | Status: AC
  Administered 2023-02-06: 1000 mg via INTRAVENOUS
  Filled 2023-02-06: qty 40

## 2023-02-06 MED ORDER — PROCHLORPERAZINE MALEATE 10 MG PO TABS
10.0000 mg | ORAL_TABLET | Freq: Once | ORAL | Status: AC
  Administered 2023-02-06: 10 mg via ORAL
  Filled 2023-02-06: qty 1

## 2023-02-06 MED ORDER — SODIUM CHLORIDE 0.9 % IV SOLN
200.0000 mg | Freq: Once | INTRAVENOUS | Status: AC
  Administered 2023-02-06: 200 mg via INTRAVENOUS
  Filled 2023-02-06: qty 200

## 2023-02-06 NOTE — Patient Instructions (Signed)
[facility]  Discharge Instructions: Thank you for choosing [facility] to provide your oncology and hematology care.   If you have a lab appointment with the Cancer Center, please go directly to the Cancer Center and check in at the registration area.   Wear comfortable clothing and clothing appropriate for easy access to any Portacath or PICC line.   We strive to give you quality time with your provider. You may need to reschedule your appointment if you arrive late (15 or more minutes).  Arriving late affects you and other patients whose appointments are after yours.  Also, if you miss three or more appointments without notifying the office, you may be dismissed from the clinic at the provider's discretion.      For prescription refill requests, have your pharmacy contact our office and allow 72 hours for refills to be completed.    Today you received the following chemotherapy and/or immunotherapy agents: Keytruda, Alimta.       To help prevent nausea and vomiting after your treatment, we encourage you to take your nausea medication as directed.  BELOW ARE SYMPTOMS THAT SHOULD BE REPORTED IMMEDIATELY: *FEVER GREATER THAN 100.4 F (38 C) OR HIGHER *CHILLS OR SWEATING *NAUSEA AND VOMITING THAT IS NOT CONTROLLED WITH YOUR NAUSEA MEDICATION *UNUSUAL SHORTNESS OF BREATH *UNUSUAL BRUISING OR BLEEDING *URINARY PROBLEMS (pain or burning when urinating, or frequent urination) *BOWEL PROBLEMS (unusual diarrhea, constipation, pain near the anus) TENDERNESS IN MOUTH AND THROAT WITH OR WITHOUT PRESENCE OF ULCERS (sore throat, sores in mouth, or a toothache) UNUSUAL RASH, SWELLING OR PAIN  UNUSUAL VAGINAL DISCHARGE OR ITCHING   Items with * indicate a potential emergency and should be followed up as soon as possible or go to the Emergency Department if any problems should occur.  Please show the CHEMOTHERAPY ALERT CARD or IMMUNOTHERAPY ALERT CARD  at check-in to the Emergency Department and triage nurse.  Should you have questions after your visit or need to cancel or reschedule your appointment, please contact [facility]  Dept: [phone]  and follow the prompts.  Office hours are 8:00 a.m. to 4:30 p.m. Monday - Friday. Please note that voicemails left after 4:00 p.m. may not be returned until the following business day.  We are closed weekends and major holidays. You have access to a nurse at all times for urgent questions. Please call the main number to the clinic Dept: [phone] and follow the prompts.   For any non-urgent questions, you may also contact your provider using MyChart. We now offer e-Visits for anyone 18 and older to request care online for non-urgent symptoms. For details visit [email or web address].   Also download the MyChart app! Go to the app store, search "MyChart", open the app, select [facility], and log in with your MyChart username and password.   

## 2023-02-06 NOTE — Telephone Encounter (Signed)
Adjusted prescription since he doesn't need starter pack anymore.  Sent script for eliquis 5 mg bid.

## 2023-02-06 NOTE — Telephone Encounter (Signed)
Dr. Sood, please advise on this. 

## 2023-02-06 NOTE — Progress Notes (Signed)
[facility] [phone]   Fax:(336) [phone]  OFFICE PROGRESS NOTE  Tracey Harries, MD [address] [place] [address]  DIAGNOSIS:  1) Stage IV (T3, N2, M1 C) non-small cell lung cancer, adenocarcinoma presented with large right upper lobe lung mass in addition to right hilar and subcarinal lymphadenopathy in addition to multiple metastatic bone lesions involving the pelvis as well as the lower thoracic and lumbar spines in addition to metastatic disease to the pelvic muscles diagnosed in February 2024.  2) deep venous thrombosis of the right lower extremity diagnosed on December 18, 2022  Detected Alteration(s) / Biomarker(s) Associated FDA-approved therapies Clinical Trial Availability% cfDNA or Amplification  KRAS G12C approved by FDA Adagrasib, Sotorasib Yes 29.3%  CDK4 Amplification None Yes High (+++)Plasma Copy Number: 8.0  PD-L1 expression 98%  PRIOR THERAPY: Palliative radiotherapy to the painful bone metastasis under the care of Dr. Mitzi Hansen  CURRENT THERAPY:  1) Systemic chemotherapy with carboplatin for AUC of 5, Alimta 500 Mg/M2 and Keytruda 200 Mg IV every 3 weeks.  First dose November 14, 2022.  Status post 4 cycles.  Working from cycle #5 the patient will be on maintenance treatment with Alimta and Keytruda every 3 weeks. 2) Eliquis 10 mg p.o. twice daily for 7 days followed by 5 mg p.o. twice daily.  First dose 12/26/2022.  INTERVAL HISTORY: Maxwell Grant 57 y.o. male returns to the clinic today for follow-up visit.  The patient is feeling fine today with no concerning complaints except for the left hip pain and shortness of breath with exertion.  He denied having any current chest pain, cough or hemoptysis.  He has no nausea, vomiting, diarrhea or constipation.  He has no headache or visual changes.  He continues to tolerate his treatment fairly well.  He is here today for evaluation before starting cycle #5.  MEDICAL  HISTORY: Past Medical History:  Diagnosis Date   CHF (congestive heart failure) (HCC)    COPD (chronic obstructive pulmonary disease) (HCC)    Diabetes mellitus without complication (HCC)    Dyspnea    Hypertension    Lung cancer (HCC) 10/2022   Right leg DVT (HCC) 12/21/2022    ALLERGIES:  is allergic to statins, aspirin, flexeril [cyclobenzaprine], and penicillins.  MEDICATIONS:  Current Outpatient Medications  Medication Sig Dispense Refill   Accu-Chek FastClix Lancets MISC Use to test blood sugar 4 times a day 102 each 0   albuterol (VENTOLIN HFA) 108 (90 Base) MCG/ACT inhaler Inhale 1-2 puffs into the lungs every 6 (six) hours as needed for wheezing or shortness of breath. 18 g 3   APIXABAN (ELIQUIS) VTE STARTER PACK (10MG  AND 5MG ) Take as directed on package: start with two-5mg  tablets twice daily for 7 days. On day 8, switch to one-5mg  tablet twice daily. 74 each 0   Fluticasone-Umeclidin-Vilant (TRELEGY ELLIPTA) 100-62.5-25 MCG/ACT AEPB Inhale 1 puff into the lungs daily in the afternoon. 60 each 5   folic acid (FOLVITE) 1 MG tablet TAKE 1 TABLET BY MOUTH EVERY DAY 90 tablet 1   glimepiride (AMARYL) 2 MG tablet Take 2 mg by mouth in the morning.     lidocaine-prilocaine (EMLA) cream Apply to [facility] Prior to Access as needed (Patient not taking: Reported on 01/16/2023) 30 g 2   meloxicam (MOBIC) 7.5 MG tablet Take 1 tablet (7.5 mg total) by mouth daily. 30 tablet 2   oxyCODONE-acetaminophen (PERCOCET) 7.5-325 MG tablet Take 1 tablet by mouth  every 6 hours as needed for severe pain. 60 tablet 0   prochlorperazine (COMPAZINE) 10 MG tablet Take 1 tablet (10 mg total) by mouth every 6 (six) hours as needed for nausea or vomiting. (Patient not taking: Reported on 01/16/2023) 30 tablet 0   No current facility-administered medications for this visit.    SURGICAL HISTORY:  Past Surgical History:  Procedure Laterality Date   BRONCHIAL NEEDLE ASPIRATION BIOPSY  10/30/2022   Procedure:  BRONCHIAL NEEDLE ASPIRATION BIOPSIES;  Surgeon: Leslye Peer, MD;  Location: [facility] ENDOSCOPY;  Service: Cardiopulmonary;;   IR IMAGING GUIDED PORT INSERTION  11/17/2022   NO PAST SURGERIES     RADIOLOGY WITH ANESTHESIA  10/30/2022   Procedure: MRI WITH ANESTHESIA W/WO CONTRAST;  Surgeon: Leslye Peer, MD;  Location: [facility] ENDOSCOPY;  Service: Cardiopulmonary;;   VIDEO BRONCHOSCOPY WITH ENDOBRONCHIAL ULTRASOUND Right 10/30/2022   Procedure: VIDEO BRONCHOSCOPY WITH ENDOBRONCHIAL ULTRASOUND;  Surgeon: Leslye Peer, MD;  Location: [facility] ENDOSCOPY;  Service: Cardiopulmonary;  Laterality: Right;    REVIEW OF SYSTEMS:  A comprehensive review of systems was negative except for: Constitutional: positive for fatigue Respiratory: positive for dyspnea on exertion Musculoskeletal: positive for bone pain   PHYSICAL EXAMINATION: General appearance: alert, cooperative, fatigued, and no distress Head: Normocephalic, without obvious abnormality, atraumatic Neck: no adenopathy, no JVD, supple, symmetrical, trachea midline, and thyroid not enlarged, symmetric, no tenderness/mass/nodules Lymph nodes: Cervical, supraclavicular, and axillary nodes normal. Resp: clear to auscultation bilaterally Back: symmetric, no curvature. ROM normal. No CVA tenderness. Cardio: regular rate and rhythm, S1, S2 normal, no murmur, click, rub or gallop GI: soft, non-tender; bowel sounds normal; no masses,  no organomegaly Extremities: extremities normal, atraumatic, no cyanosis or edema  ECOG PERFORMANCE STATUS: 1 - Symptomatic but completely ambulatory  Blood pressure (!) 145/82, pulse 74, temperature 98.1 F (36.7 C), temperature source Temporal, resp. rate 16, weight 188 lb 14.4 oz (85.7 kg), SpO2 95 %.  LABORATORY DATA: Lab Results  Component Value Date   WBC 3.5 (L) 02/06/2023   HGB 11.5 (L) 02/06/2023   HCT 34.3 (L) 02/06/2023   MCV 96.9 02/06/2023   PLT 127 (L) 02/06/2023      Chemistry      Component Value  Date/Time   NA 139 01/16/2023 1028   K 4.1 01/16/2023 1028   CL 104 01/16/2023 1028   CO2 32 01/16/2023 1028   BUN 13 01/16/2023 1028   CREATININE 0.74 01/16/2023 1028      Component Value Date/Time   CALCIUM 9.4 01/16/2023 1028   ALKPHOS 119 01/16/2023 1028   AST 13 (L) 01/16/2023 1028   ALT 9 01/16/2023 1028   BILITOT 0.5 01/16/2023 1028       RADIOGRAPHIC STUDIES: CT Chest W Contrast  Result Date: 01/16/2023 CLINICAL DATA:  Restaging metastatic non-small cell lung cancer. Undergoing chemotherapy and radiation therapy. * Tracking Code: BO * EXAM: CT CHEST, ABDOMEN, AND PELVIS WITH CONTRAST TECHNIQUE: Multidetector CT imaging of the chest, abdomen and pelvis was performed following the standard protocol during bolus administration of intravenous contrast. RADIATION DOSE REDUCTION: This exam was performed according to the departmental dose-optimization program which includes automated exposure control, adjustment of the mA and/or kV according to patient size and/or use of iterative reconstruction technique. CONTRAST:  OMNIPAQUE IOHEXOL 300 MG/ML  SOLN COMPARISON:  CT scan 10/17/2022 FINDINGS: CT CHEST FINDINGS Cardiovascular: The heart is normal in size. No pericardial effusion. Stable age advanced atherosclerotic calcifications involving the aorta and branch vessels including three-vessel coronary  artery calcifications. Mediastinum/Nodes: Interval decrease in size of mediastinal and right hilar lymph nodes. The 2 cm right hilar nodes seen on the prior study now measures 10 mm. 14 mm subcarinal node now measures 8 mm. 9 mm right paratracheal node now measures 5 mm. No new or progressive findings. Lungs/Pleura: Right upper lobe lung mass measures 4.3 x 4.0 cm and previously measured 5.6 x 5.5 cm. Stable underlying advanced emphysematous changes and pulmonary scarring. A few small sub 5 mm subpleural nodules are stable and likely lymph nodes. New nodular density in the lingula is adjacent to  a healing metastatic rib lesion and could be related to radiation change. Attention on follow-up scans is suggested. No findings suspicious for pulmonary metastatic disease. Musculoskeletal: Multiple healing rib lesions with callus formation and moderate sclerosis. New compression fractures involving the T8, T9, T10 and T11 vertebral bodies with some sclerotic change suggesting healing pathologic fractures. CT ABDOMEN PELVIS FINDINGS Hepatobiliary: No hepatic lesions to suggest metastatic disease. No intrahepatic biliary dilatation. The gallbladder is unremarkable. No common bile duct dilatation. Pancreas: No mass, inflammation or ductal dilatation. Spleen: Normal size.  No focal lesions. Adrenals/Urinary Tract: Interval decrease in size of the left adrenal gland lesion suggesting a response to treatment. It previously measured 21 mm and now measures 14 mm. No right-sided adrenal gland lesions. Simple bilateral renal cysts not requiring any further imaging evaluation or follow-up. Stomach/Bowel: The stomach, duodenum, small bowel and colon are grossly normal without oral contrast. No inflammatory changes, mass lesions or obstructive findings. The appendix is normal. Vascular/Lymphatic: Stable significant age advanced vascular disease but no aneurysm. Stable scattered upper abdominal lymph nodes. No retroperitoneal or pelvic adenopathy. No inguinal adenopathy. Reproductive: The prostate gland and seminal vesicles are unremarkable. Other: No pelvic mass or adenopathy. No free pelvic fluid collections. No inguinal mass or adenopathy. No abdominal wall hernia or subcutaneous lesions. Musculoskeletal: Extensive metastatic disease involving the left iliac bone but interval response to treatment with healing pathologic fractures. Decrease in size of the associated tumor involving the left iliacus and gluteal muscles. Right iliac bone lesion also demonstrates some healing changes. IMPRESSION: 1. Interval decrease in size of  the right upper lobe lung mass and mediastinal/right hilar adenopathy. 2. Interval decrease in size of the left adrenal gland lesion suggesting a response to treatment. 3. Extensive metastatic disease involving the left iliac bone but interval response to treatment with healing pathologic fractures. Decrease in size of the associated tumor involving the left iliacus and gluteal muscles. 4. New compression fractures involving the T8, T9, T10 and T11 vertebral bodies with some sclerotic change suggesting healing pathologic fractures. 5. New nodular density in the lingula is adjacent to a healing metastatic rib lesion and could be related to radiation change. Attention on follow-up scans is suggested. 6. Stable advanced emphysematous changes and pulmonary scarring. 7. Stable age advanced vascular disease. Aortic Atherosclerosis (ICD10-I70.0) and Emphysema (ICD10-J43.9). Electronically Signed   By: Rudie Meyer M.D.   On: 01/16/2023 10:46   CT Abdomen Pelvis W Contrast  Result Date: 01/16/2023 CLINICAL DATA:  Restaging metastatic non-small cell lung cancer. Undergoing chemotherapy and radiation therapy. * Tracking Code: BO * EXAM: CT CHEST, ABDOMEN, AND PELVIS WITH CONTRAST TECHNIQUE: Multidetector CT imaging of the chest, abdomen and pelvis was performed following the standard protocol during bolus administration of intravenous contrast. RADIATION DOSE REDUCTION: This exam was performed according to the departmental dose-optimization program which includes automated exposure control, adjustment of the mA and/or kV according to patient size  and/or use of iterative reconstruction technique. CONTRAST:  OMNIPAQUE IOHEXOL 300 MG/ML  SOLN COMPARISON:  CT scan 10/17/2022 FINDINGS: CT CHEST FINDINGS Cardiovascular: The heart is normal in size. No pericardial effusion. Stable age advanced atherosclerotic calcifications involving the aorta and branch vessels including three-vessel coronary artery calcifications.  Mediastinum/Nodes: Interval decrease in size of mediastinal and right hilar lymph nodes. The 2 cm right hilar nodes seen on the prior study now measures 10 mm. 14 mm subcarinal node now measures 8 mm. 9 mm right paratracheal node now measures 5 mm. No new or progressive findings. Lungs/Pleura: Right upper lobe lung mass measures 4.3 x 4.0 cm and previously measured 5.6 x 5.5 cm. Stable underlying advanced emphysematous changes and pulmonary scarring. A few small sub 5 mm subpleural nodules are stable and likely lymph nodes. New nodular density in the lingula is adjacent to a healing metastatic rib lesion and could be related to radiation change. Attention on follow-up scans is suggested. No findings suspicious for pulmonary metastatic disease. Musculoskeletal: Multiple healing rib lesions with callus formation and moderate sclerosis. New compression fractures involving the T8, T9, T10 and T11 vertebral bodies with some sclerotic change suggesting healing pathologic fractures. CT ABDOMEN PELVIS FINDINGS Hepatobiliary: No hepatic lesions to suggest metastatic disease. No intrahepatic biliary dilatation. The gallbladder is unremarkable. No common bile duct dilatation. Pancreas: No mass, inflammation or ductal dilatation. Spleen: Normal size.  No focal lesions. Adrenals/Urinary Tract: Interval decrease in size of the left adrenal gland lesion suggesting a response to treatment. It previously measured 21 mm and now measures 14 mm. No right-sided adrenal gland lesions. Simple bilateral renal cysts not requiring any further imaging evaluation or follow-up. Stomach/Bowel: The stomach, duodenum, small bowel and colon are grossly normal without oral contrast. No inflammatory changes, mass lesions or obstructive findings. The appendix is normal. Vascular/Lymphatic: Stable significant age advanced vascular disease but no aneurysm. Stable scattered upper abdominal lymph nodes. No retroperitoneal or pelvic adenopathy. No inguinal  adenopathy. Reproductive: The prostate gland and seminal vesicles are unremarkable. Other: No pelvic mass or adenopathy. No free pelvic fluid collections. No inguinal mass or adenopathy. No abdominal wall hernia or subcutaneous lesions. Musculoskeletal: Extensive metastatic disease involving the left iliac bone but interval response to treatment with healing pathologic fractures. Decrease in size of the associated tumor involving the left iliacus and gluteal muscles. Right iliac bone lesion also demonstrates some healing changes. IMPRESSION: 1. Interval decrease in size of the right upper lobe lung mass and mediastinal/right hilar adenopathy. 2. Interval decrease in size of the left adrenal gland lesion suggesting a response to treatment. 3. Extensive metastatic disease involving the left iliac bone but interval response to treatment with healing pathologic fractures. Decrease in size of the associated tumor involving the left iliacus and gluteal muscles. 4. New compression fractures involving the T8, T9, T10 and T11 vertebral bodies with some sclerotic change suggesting healing pathologic fractures. 5. New nodular density in the lingula is adjacent to a healing metastatic rib lesion and could be related to radiation change. Attention on follow-up scans is suggested. 6. Stable advanced emphysematous changes and pulmonary scarring. 7. Stable age advanced vascular disease. Aortic Atherosclerosis (ICD10-I70.0) and Emphysema (ICD10-J43.9). Electronically Signed   By: Rudie Meyer M.D.   On: 01/16/2023 10:46    ASSESSMENT AND PLAN: This is a very pleasant 56 years old white male with stage IV (T3, N2, M1 C) non-small cell lung cancer, adenocarcinoma presented with large right upper lobe lung mass in addition to right  hilar and subcarinal lymphadenopathy in addition to multiple metastatic bone lesions involving the pelvis as well as the lower thoracic and lumbar spines in addition to metastatic disease to the pelvic  muscles diagnosed in February 2024.  Molecular studies showed positive KRAS G12C mutation and PD-L1 expression of 98%. The patient is here today to start the first cycle of systemic chemotherapy with carboplatin for AUC of 5, Alimta 500 Mg/M2 and Keytruda 200 Mg IV every 3 weeks.  First dose November 14, 2022.  Status post 4 cycles.  Starting from cycle #5 the patient is on maintenance treatment with Alimta and Keytruda every 3 weeks. He has been tolerating this treatment fairly well. I recommended for him to proceed with cycle #5 today as planned. He will come back for follow-up visit in 3 weeks for evaluation before the next cycle of his treatment. For the pain management, he is followed by the palliative care team. For the history of right lower extremity deep venous thrombosis, he is currently on Eliquis 5 mg p.o. twice daily. The patient was advised to call immediately if he has any concerning symptoms in the interval. The patient voices understanding of current disease status and treatment options and is in agreement with the current care plan.  All questions were answered. The patient knows to call the clinic with any problems, questions or concerns. We can certainly see the patient much sooner if necessary.  The total time spent in the appointment was 20 minutes.  Disclaimer: This note was dictated with voice recognition software. Similar sounding words can inadvertently be transcribed and may not be corrected upon review.

## 2023-02-06 NOTE — Progress Notes (Signed)
Patient seen by Dr. Gypsy Balsam are within treatment parameters.  Labs reviewed: and are within treatment parameters.  Per physician team, patient is ready for treatment and there are NO modifications to the treatment plan. Keytruda and Alimta.

## 2023-02-07 ENCOUNTER — Other Ambulatory Visit (HOSPITAL_COMMUNITY): Payer: Self-pay

## 2023-02-09 ENCOUNTER — Other Ambulatory Visit (HOSPITAL_COMMUNITY): Payer: Self-pay

## 2023-02-13 ENCOUNTER — Other Ambulatory Visit: Payer: Self-pay | Admitting: Physician Assistant

## 2023-02-13 DIAGNOSIS — C3490 Malignant neoplasm of unspecified part of unspecified bronchus or lung: Secondary | ICD-10-CM

## 2023-02-15 ENCOUNTER — Other Ambulatory Visit (HOSPITAL_COMMUNITY): Payer: Self-pay

## 2023-02-15 ENCOUNTER — Ambulatory Visit (HOSPITAL_COMMUNITY)
Admission: RE | Admit: 2023-02-15 | Discharge: 2023-02-15 | Disposition: A | Discharge status: Home / Self Care | Payer: Medicaid Other | Source: Ambulatory Visit | Attending: Physician Assistant | Admitting: Physician Assistant

## 2023-02-15 DIAGNOSIS — C3490 Malignant neoplasm of unspecified part of unspecified bronchus or lung: Secondary | ICD-10-CM | POA: Diagnosis not present

## 2023-02-15 NOTE — Progress Notes (Signed)
Bilateral lower extremity venous study completed.   Please see CV Procedures for preliminary results.  Raji Glinski, RVT  10:14 AM 02/15/23

## 2023-02-16 ENCOUNTER — Other Ambulatory Visit (HOSPITAL_COMMUNITY): Payer: Self-pay

## 2023-02-27 ENCOUNTER — Encounter: Payer: Self-pay | Admitting: Medical Oncology

## 2023-02-27 ENCOUNTER — Encounter: Payer: Self-pay | Admitting: Nurse Practitioner

## 2023-02-27 ENCOUNTER — Inpatient Hospital Stay: Payer: Medicaid Other | Attending: Internal Medicine | Admitting: Internal Medicine

## 2023-02-27 ENCOUNTER — Inpatient Hospital Stay (HOSPITAL_BASED_OUTPATIENT_CLINIC_OR_DEPARTMENT_OTHER): Payer: Medicaid Other | Admitting: Nurse Practitioner

## 2023-02-27 ENCOUNTER — Other Ambulatory Visit: Payer: Self-pay

## 2023-02-27 ENCOUNTER — Inpatient Hospital Stay: Payer: Medicaid Other

## 2023-02-27 VITALS — BP 140/81 | HR 70 | Resp 16

## 2023-02-27 DIAGNOSIS — R0609 Other forms of dyspnea: Secondary | ICD-10-CM | POA: Insufficient documentation

## 2023-02-27 DIAGNOSIS — G893 Neoplasm related pain (acute) (chronic): Secondary | ICD-10-CM | POA: Diagnosis not present

## 2023-02-27 DIAGNOSIS — R5383 Other fatigue: Secondary | ICD-10-CM | POA: Diagnosis not present

## 2023-02-27 DIAGNOSIS — C3490 Malignant neoplasm of unspecified part of unspecified bronchus or lung: Secondary | ICD-10-CM

## 2023-02-27 DIAGNOSIS — K5903 Drug induced constipation: Secondary | ICD-10-CM | POA: Diagnosis not present

## 2023-02-27 DIAGNOSIS — C778 Secondary and unspecified malignant neoplasm of lymph nodes of multiple regions: Secondary | ICD-10-CM | POA: Diagnosis not present

## 2023-02-27 DIAGNOSIS — Z888 Allergy status to other drugs, medicaments and biological substances status: Secondary | ICD-10-CM | POA: Diagnosis not present

## 2023-02-27 DIAGNOSIS — M549 Dorsalgia, unspecified: Secondary | ICD-10-CM | POA: Insufficient documentation

## 2023-02-27 DIAGNOSIS — Z515 Encounter for palliative care: Secondary | ICD-10-CM | POA: Diagnosis not present

## 2023-02-27 DIAGNOSIS — Z5111 Encounter for antineoplastic chemotherapy: Secondary | ICD-10-CM | POA: Insufficient documentation

## 2023-02-27 DIAGNOSIS — C3411 Malignant neoplasm of upper lobe, right bronchus or lung: Secondary | ICD-10-CM | POA: Diagnosis present

## 2023-02-27 DIAGNOSIS — C7951 Secondary malignant neoplasm of bone: Secondary | ICD-10-CM | POA: Insufficient documentation

## 2023-02-27 DIAGNOSIS — Z86718 Personal history of other venous thrombosis and embolism: Secondary | ICD-10-CM | POA: Diagnosis not present

## 2023-02-27 DIAGNOSIS — Z886 Allergy status to analgesic agent status: Secondary | ICD-10-CM | POA: Insufficient documentation

## 2023-02-27 DIAGNOSIS — Z5112 Encounter for antineoplastic immunotherapy: Secondary | ICD-10-CM | POA: Insufficient documentation

## 2023-02-27 DIAGNOSIS — Z79899 Other long term (current) drug therapy: Secondary | ICD-10-CM | POA: Diagnosis not present

## 2023-02-27 DIAGNOSIS — Z7901 Long term (current) use of anticoagulants: Secondary | ICD-10-CM | POA: Insufficient documentation

## 2023-02-27 DIAGNOSIS — Z95828 Presence of other vascular implants and grafts: Secondary | ICD-10-CM

## 2023-02-27 DIAGNOSIS — C7989 Secondary malignant neoplasm of other specified sites: Secondary | ICD-10-CM | POA: Insufficient documentation

## 2023-02-27 DIAGNOSIS — M7989 Other specified soft tissue disorders: Secondary | ICD-10-CM | POA: Insufficient documentation

## 2023-02-27 DIAGNOSIS — Z880 Allergy status to penicillin: Secondary | ICD-10-CM | POA: Insufficient documentation

## 2023-02-27 LAB — CBC WITH DIFFERENTIAL (CANCER CENTER ONLY)
Abs Immature Granulocytes: 0.02 10*3/uL (ref 0.00–0.07)
Basophils Absolute: 0 10*3/uL (ref 0.0–0.1)
Basophils Relative: 1 %
Eosinophils Absolute: 0.2 10*3/uL (ref 0.0–0.5)
Eosinophils Relative: 4 %
HCT: 37.2 % — ABNORMAL LOW (ref 39.0–52.0)
Hemoglobin: 12.1 g/dL — ABNORMAL LOW (ref 13.0–17.0)
Immature Granulocytes: 0 %
Lymphocytes Relative: 15 %
Lymphs Abs: 0.8 10*3/uL (ref 0.7–4.0)
MCH: 33 pg (ref 26.0–34.0)
MCHC: 32.5 g/dL (ref 30.0–36.0)
MCV: 101.4 fL — ABNORMAL HIGH (ref 80.0–100.0)
Monocytes Absolute: 0.4 10*3/uL (ref 0.1–1.0)
Monocytes Relative: 9 %
Neutro Abs: 3.6 10*3/uL (ref 1.7–7.7)
Neutrophils Relative %: 71 %
Platelet Count: 208 10*3/uL (ref 150–400)
RBC: 3.67 MIL/uL — ABNORMAL LOW (ref 4.22–5.81)
RDW: 20 % — ABNORMAL HIGH (ref 11.5–15.5)
WBC Count: 5 10*3/uL (ref 4.0–10.5)
nRBC: 0 % (ref 0.0–0.2)

## 2023-02-27 LAB — CMP (CANCER CENTER ONLY)
ALT: 11 U/L (ref 0–44)
AST: 16 U/L (ref 15–41)
Albumin: 4 g/dL (ref 3.5–5.0)
Alkaline Phosphatase: 102 U/L (ref 38–126)
Anion gap: 5 (ref 5–15)
BUN: 12 mg/dL (ref 6–20)
CO2: 33 mmol/L — ABNORMAL HIGH (ref 22–32)
Calcium: 9.3 mg/dL (ref 8.9–10.3)
Chloride: 103 mmol/L (ref 98–111)
Creatinine: 0.92 mg/dL (ref 0.61–1.24)
GFR, Estimated: >60 mL/min (ref >60)
Glucose, Bld: 127 mg/dL — ABNORMAL HIGH (ref 70–99)
Potassium: 4.1 mmol/L (ref 3.5–5.1)
Sodium: 141 mmol/L (ref 135–145)
Total Bilirubin: 0.7 mg/dL (ref 0.3–1.2)
Total Protein: 6.9 g/dL (ref 6.5–8.1)

## 2023-02-27 LAB — TSH: TSH: 1.651 u[IU]/mL (ref 0.350–4.500)

## 2023-02-27 MED ORDER — PROCHLORPERAZINE MALEATE 10 MG PO TABS
10.0000 mg | ORAL_TABLET | Freq: Once | ORAL | Status: AC
  Administered 2023-02-27: 10 mg via ORAL
  Filled 2023-02-27: qty 1

## 2023-02-27 MED ORDER — SODIUM CHLORIDE 0.9 % IV SOLN: Freq: Once | INTRAVENOUS | Status: AC

## 2023-02-27 MED ORDER — SODIUM CHLORIDE 0.9 % IV SOLN
500.0000 mg/m2 | Freq: Once | INTRAVENOUS | Status: AC
  Administered 2023-02-27: 1000 mg via INTRAVENOUS
  Filled 2023-02-27: qty 40

## 2023-02-27 MED ORDER — CYANOCOBALAMIN 1000 MCG/ML IJ SOLN
1000.0000 ug | Freq: Once | INTRAMUSCULAR | Status: AC
  Administered 2023-02-27: 1000 ug via INTRAMUSCULAR
  Filled 2023-02-27: qty 1

## 2023-02-27 MED ORDER — HEPARIN SOD (PORK) LOCK FLUSH 100 UNIT/ML IV SOLN
500.0000 [IU] | Freq: Once | INTRAVENOUS | Status: AC | PRN
  Administered 2023-02-27: 500 [IU]

## 2023-02-27 MED ORDER — SODIUM CHLORIDE 0.9 % IV SOLN
200.0000 mg | Freq: Once | INTRAVENOUS | Status: AC
  Administered 2023-02-27: 200 mg via INTRAVENOUS
  Filled 2023-02-27: qty 200

## 2023-02-27 MED ORDER — OXYCODONE HCL ER 20 MG PO T12A
20.0000 mg | EXTENDED_RELEASE_TABLET | Freq: Two times a day (BID) | ORAL | 0 refills | Status: DC
Start: 2023-03-08 — End: 2023-04-10
  Filled 2023-03-09 – 2023-03-20 (×2): qty 60, 30d supply, fill #0

## 2023-02-27 MED ORDER — SODIUM CHLORIDE 0.9% FLUSH
10.0000 mL | INTRAVENOUS | Status: DC | PRN
  Administered 2023-02-27: 10 mL

## 2023-02-27 MED ORDER — OXYCODONE-ACETAMINOPHEN 7.5-325 MG PO TABS
1.0000 | ORAL_TABLET | Freq: Four times a day (QID) | ORAL | 0 refills | Status: DC | PRN
Start: 2023-03-01 — End: 2023-03-20
  Filled 2023-03-01: qty 60, 15d supply, fill #0

## 2023-02-27 MED ORDER — SODIUM CHLORIDE 0.9% FLUSH
10.0000 mL | Freq: Once | INTRAVENOUS | Status: AC | PRN
  Administered 2023-02-27: 10 mL

## 2023-02-27 NOTE — Progress Notes (Signed)
Palliative Medicine [facility]  Telephone:([phone] Fax:(336) [phone]   Name: Maxwell Grant Date: 02/27/2023 MRN: [id number]  DOB: 1965-12-04  Patient Care Team: Tracey Harries, MD as PCP - General (Family Medicine)    INTERVAL HISTORY: Maxwell Grant is a 57 y.o. male with  oncologic medical history including new diagnosed non-small cell lung cancer (10/2022), as well as a history of COPD, diabetes mellitus, hypertension, dyslipidemia as well as history of osteomyelitis of the back.  Palliative ask to see for symptom and pain management and goals of care.     SOCIAL HISTORY:    Maxwell Grant reports that he has been smoking cigarettes. He has been smoking an average of 2 packs per day. He has never used smokeless tobacco. He reports current alcohol use of about 1.0 standard drink of alcohol per week. He reports current drug use. Drug: Marijuana.  ADVANCE DIRECTIVES: none on file   CODE STATUS: Full Code  PAST MEDICAL HISTORY: Past Medical History:  Diagnosis Date   CHF (congestive heart failure) (HCC)    COPD (chronic obstructive pulmonary disease) (HCC)    Diabetes mellitus without complication (HCC)    Dyspnea    Hypertension    Lung cancer (HCC) 10/2022   Right leg DVT (HCC) 12/21/2022    ALLERGIES:  is allergic to statins, aspirin, flexeril [cyclobenzaprine], and penicillins.  MEDICATIONS:  Current Outpatient Medications  Medication Sig Dispense Refill   apixaban (ELIQUIS) 5 MG TABS tablet Take 1 tablet (5 mg total) by mouth 2 (two) times daily. 60 tablet 2   Accu-Chek FastClix Lancets MISC Use to test blood sugar 4 times a day 102 each 0   albuterol (VENTOLIN HFA) 108 (90 Base) MCG/ACT inhaler Inhale 1-2 puffs into the lungs every 6 (six) hours as needed for wheezing or shortness of breath. 18 g 3   Fluticasone-Umeclidin-Vilant (TRELEGY ELLIPTA) 100-62.5-25 MCG/ACT AEPB Inhale 1 puff into the lungs daily in the afternoon. 60 each 5   folic  acid (FOLVITE) 1 MG tablet TAKE 1 TABLET BY MOUTH EVERY DAY 90 tablet 1   glimepiride (AMARYL) 2 MG tablet Take 2 mg by mouth in the morning.     lidocaine-prilocaine (EMLA) cream Apply to [facility] Prior to Access as needed (Patient not taking: Reported on 01/16/2023) 30 g 2   meloxicam (MOBIC) 7.5 MG tablet Take 1 tablet (7.5 mg total) by mouth daily. 30 tablet 2   oxyCODONE (OXYCONTIN) 20 mg 12 hr tablet Take 1 tablet (20 mg total) by mouth every 12 (twelve) hours. 60 tablet 0   oxyCODONE-acetaminophen (PERCOCET) 7.5-325 MG tablet Take 1 tablet by mouth every 6 hours as needed for severe pain. 60 tablet 0   prochlorperazine (COMPAZINE) 10 MG tablet Take 1 tablet (10 mg total) by mouth every 6 (six) hours as needed for nausea or vomiting. (Patient not taking: Reported on 01/16/2023) 30 tablet 0   No current facility-administered medications for this visit.    VITAL SIGNS: There were no vitals taken for this visit. There were no vitals filed for this visit.  Estimated body mass index is 30.49 kg/m as calculated from the following:   Height as of 01/01/23: 5\' 6"  (1.676 m).   Weight as of 02/06/23: 188 lb 14.4 oz (85.7 kg).   PERFORMANCE STATUS (ECOG) : 2 - Symptomatic, <50% confined to bed   Physical Exam General: NAD Cardiovascular: regular rate and rhythm Pulmonary: normal breathing pattern  Abdomen: soft, nontender Extremities: no joint deformities Skin:  no rashes Neurological: oriented x 4   IMPRESSION: Maxwell Grant presents to clinic today for follow-up. No acute distress. States he is doing well overall. Family is present. Is much appreciative of how he is feeling. Ambulatory and able to increase activity overtime. Denies nausea, vomiting, constipation, or diarrhea.   Neoplasm related pain Maxwell Grant reports pain overall is well controlled. Some days are better than others. We discuss importance to listening to his body as he has become more active and not overdoing things. He  verbalized understanding.   We discussed current regimen. He is taking OxyContin 20mg  very 12 hours and Percocet as needed for breakthrough pain. Not requiring around the clock. Mobic daily.   We will continue to closely monitor and adjust medications as needed.     We discussed the importance of continued conversation with family and their medical providers regarding overall plan of care and treatment options, ensuring decisions are within the context of the patients values and GOCs.  PLAN: OxyContin 20 mg every 12 hours.  Oxycodone 7.5/325 -15mg  every 6 hours as needed for breakthrough pain. Not requiring around the clock.  Mobic 7.5 mg daily. MiraLAX twice daily. Colace daily. Ongoing symptom management support and goals of care discussions. Palliative will plan to see patient back in 3-4 weeks in collaboration to other oncology appointments.  Knows to contact office sooner if needed.   Patient expressed understanding and was in agreement with this plan. He also understands that He can call the clinic at any time with any questions, concerns, or complaints.    Any controlled substances utilized were prescribed in the context of palliative care. PDMP has been reviewed.   Visit consisted of counseling and education dealing with the complex and emotionally intense issues of symptom management and palliative care in the setting of serious and potentially life-threatening illness.Greater than 50%  of this time was spent counseling and coordinating care related to the above assessment and plan.  [name], AGPCNP-BC  Palliative Medicine Team/[facility]  *Please note that this is a verbal dictation therefore any spelling or grammatical errors are due to the "[employer] One" system interpretation.

## 2023-02-27 NOTE — Patient Instructions (Signed)
[facility]   Discharge Instructions: Thank you for choosing [facility] to provide your oncology and hematology care.   If you have a lab appointment with the Cancer Center, please go directly to the Cancer Center and check in at the registration area.   Wear comfortable clothing and clothing appropriate for easy access to any Portacath or PICC line.   We strive to give you quality time with your provider. You may need to reschedule your appointment if you arrive late (15 or more minutes).  Arriving late affects you and other patients whose appointments are after yours.  Also, if you miss three or more appointments without notifying the office, you may be dismissed from the clinic at the provider's discretion.      For prescription refill requests, have your pharmacy contact our office and allow 72 hours for refills to be completed.    Today you received the following chemotherapy and/or immunotherapy agents: Pembrolizumab (Keytruda) and Pemetrexed (Alimta)      To help prevent nausea and vomiting after your treatment, we encourage you to take your nausea medication as directed.  BELOW ARE SYMPTOMS THAT SHOULD BE REPORTED IMMEDIATELY: *FEVER GREATER THAN 100.4 F (38 C) OR HIGHER *CHILLS OR SWEATING *NAUSEA AND VOMITING THAT IS NOT CONTROLLED WITH YOUR NAUSEA MEDICATION *UNUSUAL SHORTNESS OF BREATH *UNUSUAL BRUISING OR BLEEDING *URINARY PROBLEMS (pain or burning when urinating, or frequent urination) *BOWEL PROBLEMS (unusual diarrhea, constipation, pain near the anus) TENDERNESS IN MOUTH AND THROAT WITH OR WITHOUT PRESENCE OF ULCERS (sore throat, sores in mouth, or a toothache) UNUSUAL RASH, SWELLING OR PAIN  UNUSUAL VAGINAL DISCHARGE OR ITCHING   Items with * indicate a potential emergency and should be followed up as soon as possible or go to the Emergency Department if any problems should occur.  Please show the CHEMOTHERAPY ALERT  CARD or IMMUNOTHERAPY ALERT CARD at check-in to the Emergency Department and triage nurse.  Should you have questions after your visit or need to cancel or reschedule your appointment, please contact [facility]  Dept: [phone]  and follow the prompts.  Office hours are 8:00 a.m. to 4:30 p.m. Monday - Friday. Please note that voicemails left after 4:00 p.m. may not be returned until the following business day.  We are closed weekends and major holidays. You have access to a nurse at all times for urgent questions. Please call the main number to the clinic Dept: [phone] and follow the prompts.   For any non-urgent questions, you may also contact your provider using MyChart. We now offer e-Visits for anyone 18 and older to request care online for non-urgent symptoms. For details visit [email or web address].   Also download the MyChart app! Go to the app store, search "MyChart", open the app, select [facility], and log in with your MyChart username and password. 

## 2023-02-27 NOTE — Progress Notes (Signed)
Patient seen by Dr. Mohamed  Vitals are within treatment parameters.  Labs reviewed: and are within treatment parameters.  Per physician team, patient is ready for treatment and there are NO modifications to the treatment plan. Pemetrexed and pembrolizumab 

## 2023-02-27 NOTE — Progress Notes (Signed)
[facility] [phone]   Fax:([phone]) [phone]  OFFICE PROGRESS NOTE  Maxwell Harries, MD [address] [place] [address]  DIAGNOSIS:  1) Stage IV (T3, N2, M1 C) non-small cell lung cancer, adenocarcinoma presented with large right upper lobe lung mass in addition to right hilar and subcarinal lymphadenopathy in addition to multiple metastatic bone lesions involving the pelvis as well as the lower thoracic and lumbar spines in addition to metastatic disease to the pelvic muscles diagnosed in February 2024.  2) deep venous thrombosis of the right lower extremity diagnosed on December 18, 2022  Detected Alteration(s) / Biomarker(s) Associated FDA-approved therapies Clinical Trial Availability% cfDNA or Amplification  KRAS G12C approved by FDA Adagrasib, Sotorasib Yes 29.3%  CDK4 Amplification None Yes High (+++)Plasma Copy Number: 8.0  PD-L1 expression 98%  PRIOR THERAPY: Palliative radiotherapy to the painful bone metastasis under the care of Dr. Mitzi Hansen  CURRENT THERAPY:  1) Systemic chemotherapy with carboplatin for AUC of 5, Alimta 500 Mg/M2 and Keytruda 200 Mg IV every 3 weeks.  First dose November 14, 2022.  Status post 4 cycles.  Working from cycle #5 the patient will be on maintenance treatment with Alimta and Keytruda every 3 weeks. 2) Eliquis 10 mg p.o. twice daily for 7 days followed by 5 mg p.o. twice daily.  First dose 12/26/2022.  INTERVAL HISTORY: Maxwell Grant 57 y.o. male returns to the clinic today for follow-up visit accompanied by his sister.  The patient is feeling fine today with no concerning complaints except for shortness of breath with exertion.  He has no chest pain, cough or hemoptysis.  He has no nausea, vomiting, diarrhea or constipation.  He has no headache or visual changes.  His back pain is much better.  He continues to tolerate his treatment with maintenance Alimta and Keytruda fairly well.  He has some swelling  of the lower extremities.  The patient is here today for evaluation before starting cycle #6 of his treatment.  MEDICAL HISTORY: Past Medical History:  Diagnosis Date   CHF (congestive heart failure) (HCC)    COPD (chronic obstructive pulmonary disease) (HCC)    Diabetes mellitus without complication (HCC)    Dyspnea    Hypertension    Lung cancer (HCC) 10/2022   Right leg DVT (HCC) 12/21/2022    ALLERGIES:  is allergic to statins, aspirin, flexeril [cyclobenzaprine], and penicillins.  MEDICATIONS:  Current Outpatient Medications  Medication Sig Dispense Refill   apixaban (ELIQUIS) 5 MG TABS tablet Take 1 tablet (5 mg total) by mouth 2 (two) times daily. 60 tablet 2   Accu-Chek FastClix Lancets MISC Use to test blood sugar 4 times a day 102 each 0   albuterol (VENTOLIN HFA) 108 (90 Base) MCG/ACT inhaler Inhale 1-2 puffs into the lungs every 6 (six) hours as needed for wheezing or shortness of breath. 18 g 3   Fluticasone-Umeclidin-Vilant (TRELEGY ELLIPTA) 100-62.5-25 MCG/ACT AEPB Inhale 1 puff into the lungs daily in the afternoon. 60 each 5   folic acid (FOLVITE) 1 MG tablet TAKE 1 TABLET BY MOUTH EVERY DAY 90 tablet 1   glimepiride (AMARYL) 2 MG tablet Take 2 mg by mouth in the morning.     lidocaine-prilocaine (EMLA) cream Apply to [facility] Prior to Access as needed (Patient not taking: Reported on 01/16/2023) 30 g 2   meloxicam (MOBIC) 7.5 MG tablet Take 1 tablet (7.5 mg total) by mouth daily. 30 tablet 2   oxyCODONE (  OXYCONTIN) 20 mg 12 hr tablet Take 1 tablet (20 mg total) by mouth every 12 (twelve) hours. 60 tablet 0   oxyCODONE-acetaminophen (PERCOCET) 7.5-325 MG tablet Take 1 tablet by mouth every 6 hours as needed for severe pain. 60 tablet 0   prochlorperazine (COMPAZINE) 10 MG tablet Take 1 tablet (10 mg total) by mouth every 6 (six) hours as needed for nausea or vomiting. (Patient not taking: Reported on 01/16/2023) 30 tablet 0   No current facility-administered medications for  this visit.    SURGICAL HISTORY:  Past Surgical History:  Procedure Laterality Date   BRONCHIAL NEEDLE ASPIRATION BIOPSY  10/30/2022   Procedure: BRONCHIAL NEEDLE ASPIRATION BIOPSIES;  Surgeon: Leslye Peer, MD;  Location: [facility] ENDOSCOPY;  Service: Cardiopulmonary;;   IR IMAGING GUIDED PORT INSERTION  11/17/2022   NO PAST SURGERIES     RADIOLOGY WITH ANESTHESIA  10/30/2022   Procedure: MRI WITH ANESTHESIA W/WO CONTRAST;  Surgeon: Leslye Peer, MD;  Location[facility] END[facility]COPY;  Service: Cardiopulmonary;;   VIDEO BRONCHOSCOPY WITH ENDOBRONCHIAL ULTRASOUND Right 10/30/2022   Procedure: VIDEO BRONCHOSCOPY WITH ENDOBRONCHIAL ULTRASOUND;  Surgeon: Leslye Peer, MD;  Location: [facility] ENDOSCOPY;  Service: Cardiopulmonary;  Laterality: Right;    REVIEW OF SYSTEMS:  A comprehensive review of systems was negative except for: Constitutional: positive for fatigue Respiratory: positive for dyspnea on exertion   PHYSICAL EXAMINATION: General appearance: alert, cooperative, fatigued, and no distress Head: Normocephalic, without obvious abnormality, atraumatic Neck: no adenopathy, no JVD, supple, symmetrical, trachea midline, and thyroid not enlarged, symmetric, no tenderness/mass/nodules Lymph nodes: Cervical, supraclavicular, and axillary nodes normal. Resp: clear to auscultation bilaterally Back: symmetric, no curvature. ROM normal. No CVA tenderness. Cardio: regular rate and rhythm, S1, S2 normal, no murmur, click, rub or gallop GI: soft, non-tender; bowel sounds normal; no masses,  no organomegaly Extremities: extremities normal, atraumatic, no cyanosis or edema  ECOG PERFORMANCE STATUS: 1 - Symptomatic but completely ambulatory  Blood pressure 138/79, pulse 76, temperature 98.1 F (36.7 C), temperature source Oral, resp. rate 17, weight 193 lb 4.8 oz (87.7 kg), SpO2 (!) 87 %.  LABORATORY DATA: Lab Results  Component Value Date   WBC 5.0 02/27/2023   HGB 12.1 (L) 02/27/2023   HCT 37.2 (L)  02/27/2023   MCV 101.4 (H) 02/27/2023   PLT 208 02/27/2023      Chemistry      Component Value Date/Time   NA 141 02/27/2023 0941   K 4.1 02/27/2023 0941   CL 103 02/27/2023 0941   CO2 33 (H) 02/27/2023 0941   BUN 12 02/27/2023 0941   CREATININE 0.92 02/27/2023 0941      Component Value Date/Time   CALCIUM 9.3 02/27/2023 0941   ALKPHOS 102 02/27/2023 0941   AST 16 02/27/2023 0941   ALT 11 02/27/2023 0941   BILITOT 0.7 02/27/2023 0941       RADIOGRAPHIC STUDIES: VAS [place] LOWER EXTREMITY VENOUS (DVT)  Result Date: 02/15/2023  Lower Venous DVT Study Patient Name:  MAKHAI OUTING  Date of Exam:   02/15/2023 Medical Rec #: [id number]       Accession #:    [id number] Date of Birth: 1965/11/21        Patient Gender: M Patient Age:   76 years Exam Location:  [facility] Procedure:      VAS [place] LOWER EXTREMITY VENOUS (DVT) Referring Phys: Namon Cirri --------------------------------------------------------------------------------  Indications: Swelling, and Hx of DVT.  Anticoagulation: Lovenox. Comparison Study: Previous study 12/21/22 positive. Performing Technologist: Christene Lye  RVT, VT  Examination Guidelines: A complete evaluation includes B-mode imaging, spectral Doppler, color Doppler, and power Doppler as needed of all accessible portions of each vessel. Bilateral testing is considered an integral part of a complete examination. Limited examinations for reoccurring indications may be performed as noted. The reflux portion of the exam is performed with the patient in reverse Trendelenburg.  +---------+---------------+---------+-----------+----------+--------------+ RIGHT    CompressibilityPhasicitySpontaneityPropertiesThrombus Aging +---------+---------------+---------+-----------+----------+--------------+ CFV      Full           Yes      Yes                                 +---------+---------------+---------+-----------+----------+--------------+ SFJ      Full                                                         +---------+---------------+---------+-----------+----------+--------------+ FV Prox  Full                                                        +---------+---------------+---------+-----------+----------+--------------+ FV Mid   Full                                                        +---------+---------------+---------+-----------+----------+--------------+ FV DistalFull                                                        +---------+---------------+---------+-----------+----------+--------------+ PFV      Full                                                        +---------+---------------+---------+-----------+----------+--------------+ POP      Full           Yes      Yes                                 +---------+---------------+---------+-----------+----------+--------------+ PTV      Full                                                        +---------+---------------+---------+-----------+----------+--------------+ PERO     Full                                                        +---------+---------------+---------+-----------+----------+--------------+   +---------+---------------+---------+-----------+----------+--------------+  LEFT     CompressibilityPhasicitySpontaneityPropertiesThrombus Aging +---------+---------------+---------+-----------+----------+--------------+ CFV      Full           Yes      Yes                                 +---------+---------------+---------+-----------+----------+--------------+ SFJ      Full                                                        +---------+---------------+---------+-----------+----------+--------------+ FV Prox  Full                                                        +---------+---------------+---------+-----------+----------+--------------+ FV Mid   Full                                                         +---------+---------------+---------+-----------+----------+--------------+ FV DistalFull                                                        +---------+---------------+---------+-----------+----------+--------------+ PFV      Full                                                        +---------+---------------+---------+-----------+----------+--------------+ POP      Full           Yes      Yes                                 +---------+---------------+---------+-----------+----------+--------------+ PTV      Full                                                        +---------+---------------+---------+-----------+----------+--------------+ PERO     Full                                                        +---------+---------------+---------+-----------+----------+--------------+     Summary: BILATERAL: - No evidence of deep vein thrombosis seen in the lower extremities, bilaterally. - No evidence of superficial venous thrombosis in the lower extremities, bilaterally. -No evidence of popliteal cyst, bilaterally.   *See table(s) above for measurements and observations. Electronically  signed by Heath Lark on 02/15/2023 at 5:46:43 PM.    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  Molecular studies showed positive KRAS G12C mutation and PD-L1 expression of 98%. The patient is here today to start the first cycle of systemic chemotherapy with carboplatin for AUC of 5, Alimta 500 Mg/M2 and Keytruda 200 Mg IV every 3 weeks.  First dose November 14, 2022.   Status post 5 cycles.  Starting from cycle #5 the patient is on maintenance treatment with Alimta and Keytruda every 3 weeks. The patient has been tolerating this treatment fairly well with no concerning adverse effects except for mild swelling of the lower extremities. I recommended for the patient to proceed with cycle #6 today as planned. I will see him back for follow-up visit in 3 weeks for evaluation with repeat CT scan of the chest, abdomen and pelvis for restaging of his disease. For the pain management, he is followed by the palliative care team. For the history of right lower extremity deep venous thrombosis, he is currently on Eliquis 5 mg p.o. twice daily. The patient was advised to call immediately if he has any other concerning symptoms in the interval. The patient voices understanding of current disease status and treatment options and is in agreement with the current care plan.  All questions were answered. The patient knows to call the clinic with any problems, questions or concerns. We can certainly see the patient much sooner if necessary.  The total time spent in the appointment was 20 minutes.  Disclaimer: This note was dictated with voice recognition software. Similar sounding words can inadvertently be transcribed and may not be corrected upon review.

## 2023-02-28 ENCOUNTER — Other Ambulatory Visit (HOSPITAL_COMMUNITY): Payer: Self-pay

## 2023-03-01 ENCOUNTER — Other Ambulatory Visit (HOSPITAL_COMMUNITY): Payer: Self-pay

## 2023-03-01 LAB — T4: T4, Total: 7.6 ug/dL (ref 4.5–12.0)

## 2023-03-02 ENCOUNTER — Telehealth: Payer: Self-pay | Admitting: Internal Medicine

## 2023-03-02 NOTE — Telephone Encounter (Signed)
Called patient regarding upcoming appointments, patient is notified. 

## 2023-03-09 ENCOUNTER — Encounter: Payer: Self-pay | Admitting: Internal Medicine

## 2023-03-09 ENCOUNTER — Other Ambulatory Visit: Payer: Self-pay

## 2023-03-12 ENCOUNTER — Other Ambulatory Visit (HOSPITAL_COMMUNITY): Payer: Self-pay

## 2023-03-14 ENCOUNTER — Ambulatory Visit (HOSPITAL_COMMUNITY)
Admission: RE | Admit: 2023-03-14 | Discharge: 2023-03-14 | Disposition: A | Discharge status: Home / Self Care | Payer: Medicaid Other | Source: Ambulatory Visit | Attending: Internal Medicine | Admitting: Internal Medicine

## 2023-03-14 ENCOUNTER — Other Ambulatory Visit (HOSPITAL_COMMUNITY): Payer: Self-pay

## 2023-03-14 DIAGNOSIS — C3490 Malignant neoplasm of unspecified part of unspecified bronchus or lung: Secondary | ICD-10-CM | POA: Diagnosis present

## 2023-03-14 MED ORDER — SODIUM CHLORIDE (PF) 0.9 % IJ SOLN
INTRAMUSCULAR | Status: AC
  Filled 2023-03-14: qty 50

## 2023-03-14 MED ORDER — IOHEXOL 300 MG/ML  SOLN
100.0000 mL | Freq: Once | INTRAMUSCULAR | Status: AC | PRN
  Administered 2023-03-14: 100 mL via INTRAVENOUS

## 2023-03-14 MED ORDER — HEPARIN SOD (PORK) LOCK FLUSH 100 UNIT/ML IV SOLN
INTRAVENOUS | Status: AC
  Filled 2023-03-14: qty 5

## 2023-03-14 MED ORDER — HEPARIN SOD (PORK) LOCK FLUSH 100 UNIT/ML IV SOLN
500.0000 [IU] | Freq: Once | INTRAVENOUS | Status: AC
  Administered 2023-03-14: 500 [IU] via INTRAVENOUS

## 2023-03-17 NOTE — Progress Notes (Signed)
[facility] OFFICE PROGRESS NOTE  Tracey Harries, MD [address] [place] [address]  DIAGNOSIS:  1) Stage IV (T3, N2, M1 C) non-small cell lung cancer, adenocarcinoma presented with large right upper lobe lung mass in addition to right hilar and subcarinal lymphadenopathy in addition to multiple metastatic bone lesions involving the pelvis as well as the lower thoracic and lumbar spines in addition to metastatic disease to the pelvic muscles diagnosed in February 2024.  2) deep venous thrombosis of the right lower extremity diagnosed on December 18, 2022   Detected Alteration(s) / Biomarker(s)         Associated FDA-approved therapies  Clinical Trial Availability% cfDNA or Amplification   KRAS G12C approved by FDA Adagrasib, Sotorasib Yes    29.3%   CDK4 Amplification None Yes            High (+++)Plasma Copy Number: 8.0   PD-L1 expression 98%  PRIOR THERAPY: Palliative radiotherapy to the painful bone metastasis under the care of Dr. Mitzi Hansen   CURRENT THERAPY: 1) Systemic chemotherapy with carboplatin for AUC of 5, Alimta 500 Mg/M2 and Keytruda 200 Mg IV every 3 weeks.  First dose November 14, 2022.  Status post 6 cycles.  Working from cycle #5 the patient has been on maintenance treatment with Alimta and Keytruda every 3 weeks. 2) Eliquis 10 mg p.o. twice daily for 7 days followed by 5 mg p.o. twice daily.  First dose 12/26/2022.  INTERVAL HISTORY: Maxwell Grant 57 y.o. male returns to the clinic for a follow up visit accompanied by his sister. The patient is feeling fair well today without any concerning complaints except for fatigue and lateral lower extremity swelling that occurs approximately 3 days after chemotherapy.  This does improve with elevation in the morning.  His legs are not significantly swollen at this time but are dry appearing.  He is currently on Eliquis.  He had a history of a DVT in the past but his most recent lower extremity Doppler  ultrasound approximately 1 month ago for the same concern which was negative for DVT.  The patient's echo from a few months ago shows EF of approximately 55%.   He did not notice an appreciable improvement in his fatigue with starting maintenance treatment.  He follows closely with palliative care for which he takes oxycontin 20 mg every 12 hours, oxycodone 7.5 mg, and mobic. However, he states he ran out of his Percocet in May and was told that there needed to be a prior authorization.  They did not realize that a prior Auth was completed and that his Percocet is ready for pickup.  He takes bowel prophylaxis with miralax and colace. Denies any fever, chills, night sweats, or weight loss. Denies any chest pain or hemoptysis.  Ports baseline dyspnea on exertion and intermittent chronic cough.  The patient continues to smoke cigarettes.  He has some queasiness following treatments for which she is prescribed Compazine.  He denies any vomiting, diarrhea, or constipation. Denies any headache or visual changes. Denies any rashes or skin changes except for the dry skin on his legs. He recently had a restaging CT scan. The patient is here today for evaluation and to review his scan results prior to starting cycle # 7    MEDICAL HISTORY: Past Medical History:  Diagnosis Date   CHF (congestive heart failure) (HCC)    COPD (chronic obstructive pulmonary disease) (HCC)    Diabetes mellitus without complication (HCC)  Dyspnea    Hypertension    Lung cancer (HCC) 10/2022   Right leg DVT (HCC) 12/21/2022    ALLERGIES:  is allergic to statins, aspirin, flexeril [cyclobenzaprine], and penicillins.  MEDICATIONS:  Current Outpatient Medications  Medication Sig Dispense Refill   apixaban (ELIQUIS) 5 MG TABS tablet Take 1 tablet (5 mg total) by mouth 2 (two) times daily. 60 tablet 2   Accu-Chek FastClix Lancets MISC Use to test blood sugar 4 times a day 102 each 0   albuterol (VENTOLIN HFA) 108 (90 Base)  MCG/ACT inhaler Inhale 1-2 puffs into the lungs every 6 (six) hours as needed for wheezing or shortness of breath. 18 g 3   Fluticasone-Umeclidin-Vilant (TRELEGY ELLIPTA) 100-62.5-25 MCG/ACT AEPB Inhale 1 puff into the lungs daily in the afternoon. 60 each 5   folic acid (FOLVITE) 1 MG tablet TAKE 1 TABLET BY MOUTH EVERY DAY 90 tablet 1   glimepiride (AMARYL) 2 MG tablet Take 2 mg by mouth in the morning.     lidocaine-prilocaine (EMLA) cream Apply to [facility] Prior to Access as needed (Patient not taking: Reported on 01/16/2023) 30 g 2   meloxicam (MOBIC) 7.5 MG tablet Take 1 tablet (7.5 mg total) by mouth daily. 30 tablet 2   oxyCODONE (OXYCONTIN) 20 mg 12 hr tablet Take 1 tablet (20 mg) by mouth every 12 hours as needed 60 tablet 0   oxyCODONE-acetaminophen (PERCOCET) 7.5-325 MG tablet Take 1 tablet by mouth every 6 hours as needed for severe pain. 60 tablet 0   prochlorperazine (COMPAZINE) 10 MG tablet Take 1 tablet (10 mg total) by mouth every 6 (six) hours as needed for nausea or vomiting. (Patient not taking: Reported on 01/16/2023) 30 tablet 0   No current facility-administered medications for this visit.    SURGICAL HISTORY:  Past Surgical History:  Procedure Laterality Date   BRONCHIAL NEEDLE ASPIRATION BIOPSY  10/30/2022   Procedure: BRONCHIAL NEEDLE ASPIRATION BIOPSIES;  Surgeon: Leslye Peer, MD;  Location: [facility] ENDOSCOPY;  Service: Cardiopulmonary;;   IR IMAGING GUIDED PORT INSERTION  11/17/2022   NO PAST SURGERIES     RADIOLOGY WITH ANESTHESIA  10/30/2022   Procedure: MRI WITH ANESTHESIA W/WO CONTRAST;  Surgeon: Leslye Peer, MD;  Location: [facility] ENDOSCOPY;  Service: Cardiopulmonary;;   VIDEO BRONCHOSCOPY WITH ENDOBRONCHIAL ULTRASOUND Right 10/30/2022   Procedure: VIDEO BRONCHOSCOPY WITH ENDOBRONCHIAL ULTRASOUND;  Surgeon: Leslye Peer, MD;  Location: [facility] ENDOSCOPY;  Service: Cardiopulmonary;  Laterality: Right;    REVIEW OF SYSTEMS:   Review of Systems  Constitutional: Positive  for fatigue.  Negative for appetite change, chills, fever and unexpected weight change.  HENT: Negative for mouth sores, nosebleeds, sore throat and trouble swallowing.   Eyes: Negative for eye problems and icterus.  Respiratory: Positive for baseline dyspnea on exertion and intermittent cough.  Negative for hemoptysis and wheezing.   Cardiovascular: Positive for leg swelling 3 days after chemotherapy. Negative for chest pain Gastrointestinal: Positive for occasional nausea. Negative for abdominal pain, constipation, diarrhea, and vomiting.  Genitourinary: Negative for bladder incontinence, difficulty urinating, dysuria, frequency and hematuria.   Musculoskeletal: Positive for cancer related bone pain in hip and back. Negative for neck pain and neck stiffness.  Skin: Negative for itching and rash.  Neurological: Negative for dizziness, extremity weakness, gait problem, headaches, light-headedness and seizures.  Hematological: Negative for adenopathy. Does not bruise/bleed easily.  Psychiatric/Behavioral: Negative for confusion, depression and sleep disturbance. The patient is not nervous/anxious.     PHYSICAL EXAMINATION:  There were  no vitals taken for this visit.  ECOG PERFORMANCE STATUS: 1-2  Physical Exam  Constitutional: Oriented to person, place, and time and well-developed, well-nourished, and in no distress.  HENT:  Head: Normocephalic and atraumatic.  Mouth/Throat: Oropharynx is clear and moist. No oropharyngeal exudate.  Eyes: Conjunctivae are normal. Right eye exhibits no discharge. Left eye exhibits no discharge. No scleral icterus.  Neck: Normal range of motion. Neck supple.  Cardiovascular: Normal rate, regular rhythm, normal heart sounds and intact distal pulses.   Pulmonary/Chest: Effort normal and breath sounds normal. Positive for mild wheezing in left lung. No respiratory distress. No wheezes. No rales.  Abdominal: Soft. Bowel sounds are normal. Exhibits no distension  and no mass. There is no tenderness.  Musculoskeletal: Normal range of motion. Exhibits no significant lower extremity edema, just dry skin.  Lymphadenopathy:    No cervical adenopathy.  Neurological: Alert and oriented to person, place, and time. Exhibits muscle wasting. Examined in the wheelchair.  Skin: Skin is warm and dry. No rash noted. Not diaphoretic. No erythema. No pallor.  Psychiatric: Mood, memory and judgment normal.  Vitals reviewed.  LABORATORY DATA: Lab Results  Component Value Date   WBC 5.0 02/27/2023   HGB 12.1 (L) 02/27/2023   HCT 37.2 (L) 02/27/2023   MCV 101.4 (H) 02/27/2023   PLT 208 02/27/2023      Chemistry      Component Value Date/Time   NA 141 02/27/2023 0941   K 4.1 02/27/2023 0941   CL 103 02/27/2023 0941   CO2 33 (H) 02/27/2023 0941   BUN 12 02/27/2023 0941   CREATININE 0.92 02/27/2023 0941      Component Value Date/Time   CALCIUM 9.3 02/27/2023 0941   ALKPHOS 102 02/27/2023 0941   AST 16 02/27/2023 0941   ALT 11 02/27/2023 0941   BILITOT 0.7 02/27/2023 0941       RADIOGRAPHIC STUDIES:  No results found.   ASSESSMENT/PLAN:  This is a very pleasant 57 years old Caucasian male with stage IV (T3, N2, M1 C) non-small cell lung cancer, adenocarcinoma presented with large right upper lobe lung mass in addition to right hilar and subcarinal lymphadenopathy in addition to multiple metastatic bone lesions involving the pelvis as well as the lower thoracic and lumbar spines in addition to metastatic disease to the pelvic muscles diagnosed in February 2024.  Molecular studies showed positive KRAS G12C mutation and PD-L1 expression of 98%.   The patient is here today to start the first cycle of systemic chemotherapy with carboplatin for AUC of 5, Alimta 500 Mg/M2 and Keytruda 200 Mg IV every 3 weeks.  First dose November 14, 2022.  Status post 6 cycles.  Starting from cycle #5 the patient is on maintenance treatment with Alimta and Keytruda every 3  weeks.   The patient was seen with Dr. Arbutus Ped today.  Dr. Arbutus Ped personally and independently reviewed the scan and discussed results with the patient today.  The scan showed no evidence of disease progression.  Dr. Arbutus Ped recommends continue on the same treatment at the same dose.  He will proceed with cycle #7 today as scheduled.   We will see him back for labs and a follow up visit in 3 weeks for evaluation and repeat blood work before starting cycle #8.   For the pain management, he is followed by the palliative care team.  I reached out to them and they stated that his refill of Percocet is ready at the pharmacy.  For the  history of right lower extremity deep venous thrombosis, he is currently on Eliquis 5 mg p.o. twice daily.  No significant swelling at this time but the patient states he has lower extremity swelling Doximity 3 days after chemotherapy.  He advised to elevate his lower extremities and use compression stockings.  I will give him a few tablets of 20 mg of Lasix to use 1 time daily as needed for swelling.  He was advised to check his blood pressure prior to taking Lasix.  His systolic blood pressure is less than 120, he was advised to hold Lasix.  He was also advised to increase his intake of potassium rich food on days he takes Lasix.  For the nausea and queasiness, I sent him a prescription of Zofran to alternate with Compazine if needed for nausea.  Regarding his fatigue, discussed that if significant intolerance to chemotherapy, we could consider dose reduction of Alimta.  The patient is not want dose reduction at this time but knows that this is an option in the future if needed.  Additionally, if he ever has intolerance or toxicity to Alimta, we can proceed with single agent immunotherapy with Keytruda. The patient was advised to call immediately if he has any other concerning symptoms in the interval.  The patient was advised to call immediately if he has any concerning  symptoms in the interval. The patient voices understanding of current disease status and treatment options and is in agreement with the current care plan. All questions were answered. The patient knows to call the clinic with any problems, questions or concerns. We can certainly see the patient much sooner if necessary  No orders of the defined types were placed in this encounter.    Jud Fanguy L Jaylean Buenaventura, PA-C 03/17/23  ADDENDUM: Hematology/Oncology Attending: I had the to face encounter with the patient.  I reviewed his record records, lab, scan and recommended his care plan.  This is a very pleasant 57 years old white male with a stage IV non-small cell lung cancer, adenocarcinoma with positive KRAS G12C mutation and PD-L1 expression of 98%.  The patient is status post palliative radiotherapy to the painful bone lesions involving the lower thoracic and lumbar spine as well as the pelvis.  He started systemic chemotherapy with induction carboplatin, Alimta and Keytruda for 4 cycles and currently on maintenance treatment and Keytruda for 2 more cycles.  He has been tolerating the treatment well except for the fatigue.  He continues to have the weakness in the lower extremity secondary to his bone disease and also chronic degenerative disc disease. The patient had repeat CT scan of the chest, abdomen and pelvis performed recently.  I personally and independently reviewed the scan and discussed the result with the patient and his sister today. His scan showed stable disease with no concerning findings for progression. I recommended for him to continue his current maintenance treatment with Alimta and Keytruda for now.  He will proceed with cycle #7 today. He will come back for follow-up visit in 3 weeks before the next cycle of his treatment. The patient was advised to call immediately if he has any concerning symptoms in the interval. The total time spent in the appointment was 30  minutes. Disclaimer: This note was dictated with voice recognition software. Similar sounding words can inadvertently be transcribed and may be missed upon review. Lajuana Matte, MD

## 2023-03-20 ENCOUNTER — Other Ambulatory Visit: Payer: Self-pay | Admitting: Nurse Practitioner

## 2023-03-20 ENCOUNTER — Inpatient Hospital Stay: Payer: Medicaid Other

## 2023-03-20 ENCOUNTER — Other Ambulatory Visit (HOSPITAL_COMMUNITY): Payer: Self-pay

## 2023-03-20 ENCOUNTER — Other Ambulatory Visit: Payer: Self-pay

## 2023-03-20 ENCOUNTER — Inpatient Hospital Stay: Payer: Medicaid Other | Attending: Internal Medicine

## 2023-03-20 ENCOUNTER — Inpatient Hospital Stay (HOSPITAL_BASED_OUTPATIENT_CLINIC_OR_DEPARTMENT_OTHER): Payer: Medicaid Other | Admitting: Physician Assistant

## 2023-03-20 VITALS — BP 132/78 | HR 76 | Temp 97.3°F | Resp 15 | Wt 191.9 lb

## 2023-03-20 DIAGNOSIS — Z79620 Long term (current) use of immunosuppressive biologic: Secondary | ICD-10-CM | POA: Insufficient documentation

## 2023-03-20 DIAGNOSIS — Z7901 Long term (current) use of anticoagulants: Secondary | ICD-10-CM | POA: Insufficient documentation

## 2023-03-20 DIAGNOSIS — R059 Cough, unspecified: Secondary | ICD-10-CM | POA: Insufficient documentation

## 2023-03-20 DIAGNOSIS — Z515 Encounter for palliative care: Secondary | ICD-10-CM

## 2023-03-20 DIAGNOSIS — M7989 Other specified soft tissue disorders: Secondary | ICD-10-CM | POA: Insufficient documentation

## 2023-03-20 DIAGNOSIS — R0609 Other forms of dyspnea: Secondary | ICD-10-CM | POA: Diagnosis not present

## 2023-03-20 DIAGNOSIS — R110 Nausea: Secondary | ICD-10-CM | POA: Diagnosis not present

## 2023-03-20 DIAGNOSIS — M898X0 Other specified disorders of bone, multiple sites: Secondary | ICD-10-CM | POA: Insufficient documentation

## 2023-03-20 DIAGNOSIS — I700 Atherosclerosis of aorta: Secondary | ICD-10-CM | POA: Insufficient documentation

## 2023-03-20 DIAGNOSIS — C3490 Malignant neoplasm of unspecified part of unspecified bronchus or lung: Secondary | ICD-10-CM

## 2023-03-20 DIAGNOSIS — Z5112 Encounter for antineoplastic immunotherapy: Secondary | ICD-10-CM | POA: Diagnosis present

## 2023-03-20 DIAGNOSIS — C3411 Malignant neoplasm of upper lobe, right bronchus or lung: Secondary | ICD-10-CM | POA: Diagnosis present

## 2023-03-20 DIAGNOSIS — E119 Type 2 diabetes mellitus without complications: Secondary | ICD-10-CM | POA: Insufficient documentation

## 2023-03-20 DIAGNOSIS — Z5111 Encounter for antineoplastic chemotherapy: Secondary | ICD-10-CM

## 2023-03-20 DIAGNOSIS — Z880 Allergy status to penicillin: Secondary | ICD-10-CM | POA: Insufficient documentation

## 2023-03-20 DIAGNOSIS — G893 Neoplasm related pain (acute) (chronic): Secondary | ICD-10-CM | POA: Diagnosis not present

## 2023-03-20 DIAGNOSIS — R531 Weakness: Secondary | ICD-10-CM | POA: Diagnosis not present

## 2023-03-20 DIAGNOSIS — C7951 Secondary malignant neoplasm of bone: Secondary | ICD-10-CM | POA: Diagnosis not present

## 2023-03-20 DIAGNOSIS — J432 Centrilobular emphysema: Secondary | ICD-10-CM | POA: Diagnosis not present

## 2023-03-20 DIAGNOSIS — Z79631 Long term (current) use of antimetabolite agent: Secondary | ICD-10-CM | POA: Insufficient documentation

## 2023-03-20 DIAGNOSIS — Z86718 Personal history of other venous thrombosis and embolism: Secondary | ICD-10-CM | POA: Diagnosis not present

## 2023-03-20 DIAGNOSIS — I10 Essential (primary) hypertension: Secondary | ICD-10-CM | POA: Diagnosis not present

## 2023-03-20 DIAGNOSIS — C7989 Secondary malignant neoplasm of other specified sites: Secondary | ICD-10-CM | POA: Insufficient documentation

## 2023-03-20 DIAGNOSIS — Z886 Allergy status to analgesic agent status: Secondary | ICD-10-CM | POA: Insufficient documentation

## 2023-03-20 DIAGNOSIS — E785 Hyperlipidemia, unspecified: Secondary | ICD-10-CM | POA: Insufficient documentation

## 2023-03-20 DIAGNOSIS — M533 Sacrococcygeal disorders, not elsewhere classified: Secondary | ICD-10-CM | POA: Diagnosis not present

## 2023-03-20 DIAGNOSIS — R5383 Other fatigue: Secondary | ICD-10-CM | POA: Insufficient documentation

## 2023-03-20 DIAGNOSIS — Z888 Allergy status to other drugs, medicaments and biological substances status: Secondary | ICD-10-CM | POA: Insufficient documentation

## 2023-03-20 DIAGNOSIS — F17210 Nicotine dependence, cigarettes, uncomplicated: Secondary | ICD-10-CM | POA: Diagnosis not present

## 2023-03-20 LAB — CBC WITH DIFFERENTIAL (CANCER CENTER ONLY)
Abs Immature Granulocytes: 0.03 10*3/uL (ref 0.00–0.07)
Basophils Absolute: 0.1 10*3/uL (ref 0.0–0.1)
Basophils Relative: 1 %
Eosinophils Absolute: 0.3 10*3/uL (ref 0.0–0.5)
Eosinophils Relative: 4 %
HCT: 41.3 % (ref 39.0–52.0)
Hemoglobin: 13.4 g/dL (ref 13.0–17.0)
Immature Granulocytes: 0 %
Lymphocytes Relative: 12 %
Lymphs Abs: 0.9 10*3/uL (ref 0.7–4.0)
MCH: 33.7 pg (ref 26.0–34.0)
MCHC: 32.4 g/dL (ref 30.0–36.0)
MCV: 103.8 fL — ABNORMAL HIGH (ref 80.0–100.0)
Monocytes Absolute: 0.4 10*3/uL (ref 0.1–1.0)
Monocytes Relative: 6 %
Neutro Abs: 5.5 10*3/uL (ref 1.7–7.7)
Neutrophils Relative %: 77 %
Platelet Count: 223 10*3/uL (ref 150–400)
RBC: 3.98 MIL/uL — ABNORMAL LOW (ref 4.22–5.81)
RDW: 16.4 % — ABNORMAL HIGH (ref 11.5–15.5)
WBC Count: 7.2 10*3/uL (ref 4.0–10.5)
nRBC: 0 % (ref 0.0–0.2)

## 2023-03-20 LAB — CMP (CANCER CENTER ONLY)
ALT: 10 U/L (ref 0–44)
AST: 14 U/L — ABNORMAL LOW (ref 15–41)
Albumin: 3.9 g/dL (ref 3.5–5.0)
Alkaline Phosphatase: 92 U/L (ref 38–126)
Anion gap: 5 (ref 5–15)
BUN: 14 mg/dL (ref 6–20)
CO2: 32 mmol/L (ref 22–32)
Calcium: 8.9 mg/dL (ref 8.9–10.3)
Chloride: 102 mmol/L (ref 98–111)
Creatinine: 0.79 mg/dL (ref 0.61–1.24)
GFR, Estimated: >60 mL/min (ref >60)
Glucose, Bld: 144 mg/dL — ABNORMAL HIGH (ref 70–99)
Potassium: 4.4 mmol/L (ref 3.5–5.1)
Sodium: 139 mmol/L (ref 135–145)
Total Bilirubin: 0.7 mg/dL (ref 0.3–1.2)
Total Protein: 6.6 g/dL (ref 6.5–8.1)

## 2023-03-20 MED ORDER — OXYCODONE-ACETAMINOPHEN 7.5-325 MG PO TABS
1.0000 | ORAL_TABLET | Freq: Four times a day (QID) | ORAL | 0 refills | Status: DC | PRN
Start: 2023-03-20 — End: 2023-04-10
  Filled 2023-03-20: qty 60, 15d supply, fill #0

## 2023-03-20 MED ORDER — HEPARIN SOD (PORK) LOCK FLUSH 100 UNIT/ML IV SOLN
500.0000 [IU] | Freq: Once | INTRAVENOUS | Status: AC | PRN
  Administered 2023-03-20: 500 [IU]

## 2023-03-20 MED ORDER — FUROSEMIDE 20 MG PO TABS
20.0000 mg | ORAL_TABLET | Freq: Every day | ORAL | 0 refills | Status: DC | PRN
Start: 2023-03-20 — End: 2024-07-13
  Filled 2023-03-20: qty 7, 7d supply, fill #0

## 2023-03-20 MED ORDER — ONDANSETRON HCL 8 MG PO TABS
8.0000 mg | ORAL_TABLET | Freq: Three times a day (TID) | ORAL | 2 refills | Status: DC | PRN
Start: 2023-03-20 — End: 2024-02-03
  Filled 2023-03-20: qty 30, 10d supply, fill #0
  Filled 2023-12-05: qty 30, 10d supply, fill #1
  Filled 2024-01-11: qty 30, 10d supply, fill #2

## 2023-03-20 MED ORDER — SODIUM CHLORIDE 0.9 % IV SOLN
500.0000 mg/m2 | Freq: Once | INTRAVENOUS | Status: AC
  Administered 2023-03-20: 1000 mg via INTRAVENOUS
  Filled 2023-03-20: qty 40

## 2023-03-20 MED ORDER — SODIUM CHLORIDE 0.9 % IV SOLN: Freq: Once | INTRAVENOUS | Status: AC

## 2023-03-20 MED ORDER — PROCHLORPERAZINE MALEATE 10 MG PO TABS
10.0000 mg | ORAL_TABLET | Freq: Once | ORAL | Status: AC
  Administered 2023-03-20: 10 mg via ORAL
  Filled 2023-03-20: qty 1

## 2023-03-20 MED ORDER — SODIUM CHLORIDE 0.9% FLUSH
10.0000 mL | INTRAVENOUS | Status: DC | PRN
  Administered 2023-03-20: 10 mL

## 2023-03-20 MED ORDER — SODIUM CHLORIDE 0.9 % IV SOLN
200.0000 mg | Freq: Once | INTRAVENOUS | Status: AC
  Administered 2023-03-20: 200 mg via INTRAVENOUS
  Filled 2023-03-20: qty 200

## 2023-03-20 NOTE — Patient Instructions (Signed)
[facility]  Discharge Instructions: Thank you for choosing [facility] to provide your oncology and hematology care.   If you have a lab appointment with the Cancer Center, please go directly to the Cancer Center and check in at the registration area.   Wear comfortable clothing and clothing appropriate for easy access to any Portacath or PICC line.   We strive to give you quality time with your provider. You may need to reschedule your appointment if you arrive late (15 or more minutes).  Arriving late affects you and other patients whose appointments are after yours.  Also, if you miss three or more appointments without notifying the office, you may be dismissed from the clinic at the provider's discretion.      For prescription refill requests, have your pharmacy contact our office and allow 72 hours for refills to be completed.    Today you received the following chemotherapy and/or immunotherapy agents: Keytruda, Alimta.       To help prevent nausea and vomiting after your treatment, we encourage you to take your nausea medication as directed.  BELOW ARE SYMPTOMS THAT SHOULD BE REPORTED IMMEDIATELY: *FEVER GREATER THAN 100.4 F (38 C) OR HIGHER *CHILLS OR SWEATING *NAUSEA AND VOMITING THAT IS NOT CONTROLLED WITH YOUR NAUSEA MEDICATION *UNUSUAL SHORTNESS OF BREATH *UNUSUAL BRUISING OR BLEEDING *URINARY PROBLEMS (pain or burning when urinating, or frequent urination) *BOWEL PROBLEMS (unusual diarrhea, constipation, pain near the anus) TENDERNESS IN MOUTH AND THROAT WITH OR WITHOUT PRESENCE OF ULCERS (sore throat, sores in mouth, or a toothache) UNUSUAL RASH, SWELLING OR PAIN  UNUSUAL VAGINAL DISCHARGE OR ITCHING   Items with * indicate a potential emergency and should be followed up as soon as possible or go to the Emergency Department if any problems should occur.  Please show the CHEMOTHERAPY ALERT CARD or IMMUNOTHERAPY ALERT CARD  at check-in to the Emergency Department and triage nurse.  Should you have questions after your visit or need to cancel or reschedule your appointment, please contact [facility]  Dept: [phone]  and follow the prompts.  Office hours are 8:00 a.m. to 4:30 p.m. Monday - Friday. Please note that voicemails left after 4:00 p.m. may not be returned until the following business day.  We are closed weekends and major holidays. You have access to a nurse at all times for urgent questions. Please call the main number to the clinic Dept: [phone] and follow the prompts.   For any non-urgent questions, you may also contact your provider using MyChart. We now offer e-Visits for anyone 18 and older to request care online for non-urgent symptoms. For details visit [email or web address].   Also download the MyChart app! Go to the app store, search "MyChart", open the app, select [facility], and log in with your MyChart username and password.   

## 2023-04-01 ENCOUNTER — Other Ambulatory Visit: Payer: Self-pay | Admitting: Internal Medicine

## 2023-04-02 MED ORDER — PROCHLORPERAZINE MALEATE 10 MG PO TABS: 10.0000 mg | ORAL_TABLET | Freq: Four times a day (QID) | ORAL | 0 refills | Status: AC | PRN

## 2023-04-10 ENCOUNTER — Inpatient Hospital Stay (HOSPITAL_BASED_OUTPATIENT_CLINIC_OR_DEPARTMENT_OTHER): Payer: Medicaid Other | Admitting: Nurse Practitioner

## 2023-04-10 ENCOUNTER — Other Ambulatory Visit: Payer: Medicaid Other

## 2023-04-10 ENCOUNTER — Encounter: Payer: Self-pay | Admitting: Internal Medicine

## 2023-04-10 ENCOUNTER — Other Ambulatory Visit: Payer: Self-pay

## 2023-04-10 ENCOUNTER — Other Ambulatory Visit (HOSPITAL_COMMUNITY): Payer: Self-pay

## 2023-04-10 ENCOUNTER — Encounter: Payer: Self-pay | Admitting: Nurse Practitioner

## 2023-04-10 ENCOUNTER — Inpatient Hospital Stay (HOSPITAL_BASED_OUTPATIENT_CLINIC_OR_DEPARTMENT_OTHER): Payer: Medicaid Other | Admitting: Internal Medicine

## 2023-04-10 ENCOUNTER — Inpatient Hospital Stay: Payer: Medicaid Other

## 2023-04-10 DIAGNOSIS — C3490 Malignant neoplasm of unspecified part of unspecified bronchus or lung: Secondary | ICD-10-CM

## 2023-04-10 DIAGNOSIS — Z515 Encounter for palliative care: Secondary | ICD-10-CM | POA: Diagnosis not present

## 2023-04-10 DIAGNOSIS — G893 Neoplasm related pain (acute) (chronic): Secondary | ICD-10-CM | POA: Diagnosis not present

## 2023-04-10 DIAGNOSIS — Z95828 Presence of other vascular implants and grafts: Secondary | ICD-10-CM

## 2023-04-10 DIAGNOSIS — Z5112 Encounter for antineoplastic immunotherapy: Secondary | ICD-10-CM | POA: Diagnosis not present

## 2023-04-10 LAB — CBC WITH DIFFERENTIAL (CANCER CENTER ONLY)
Abs Immature Granulocytes: 0.02 10*3/uL (ref 0.00–0.07)
Basophils Absolute: 0.1 10*3/uL (ref 0.0–0.1)
Basophils Relative: 1 %
Eosinophils Absolute: 0.4 10*3/uL (ref 0.0–0.5)
Eosinophils Relative: 7 %
HCT: 40.9 % (ref 39.0–52.0)
Hemoglobin: 13 g/dL (ref 13.0–17.0)
Immature Granulocytes: 0 %
Lymphocytes Relative: 18 %
Lymphs Abs: 1 10*3/uL (ref 0.7–4.0)
MCH: 32.5 pg (ref 26.0–34.0)
MCHC: 31.8 g/dL (ref 30.0–36.0)
MCV: 102.3 fL — ABNORMAL HIGH (ref 80.0–100.0)
Monocytes Absolute: 0.5 10*3/uL (ref 0.1–1.0)
Monocytes Relative: 8 %
Neutro Abs: 3.6 10*3/uL (ref 1.7–7.7)
Neutrophils Relative %: 66 %
Platelet Count: 209 10*3/uL (ref 150–400)
RBC: 4 MIL/uL — ABNORMAL LOW (ref 4.22–5.81)
RDW: 15.1 % (ref 11.5–15.5)
WBC Count: 5.5 10*3/uL (ref 4.0–10.5)
nRBC: 0 % (ref 0.0–0.2)

## 2023-04-10 LAB — CMP (CANCER CENTER ONLY)
ALT: 13 U/L (ref 0–44)
AST: 14 U/L — ABNORMAL LOW (ref 15–41)
Albumin: 4 g/dL (ref 3.5–5.0)
Alkaline Phosphatase: 84 U/L (ref 38–126)
Anion gap: 4 — ABNORMAL LOW (ref 5–15)
BUN: 12 mg/dL (ref 6–20)
CO2: 33 mmol/L — ABNORMAL HIGH (ref 22–32)
Calcium: 9.6 mg/dL (ref 8.9–10.3)
Chloride: 103 mmol/L (ref 98–111)
Creatinine: 0.78 mg/dL (ref 0.61–1.24)
GFR, Estimated: >60 mL/min (ref >60)
Glucose, Bld: 54 mg/dL — ABNORMAL LOW (ref 70–99)
Potassium: 4.5 mmol/L (ref 3.5–5.1)
Sodium: 140 mmol/L (ref 135–145)
Total Bilirubin: 0.6 mg/dL (ref 0.3–1.2)
Total Protein: 6.6 g/dL (ref 6.5–8.1)

## 2023-04-10 MED ORDER — OXYCODONE-ACETAMINOPHEN 7.5-325 MG PO TABS
1.0000 | ORAL_TABLET | Freq: Four times a day (QID) | ORAL | 0 refills | Status: DC | PRN
Start: 2023-04-10 — End: 2023-05-01
  Filled 2023-04-10: qty 60, 15d supply, fill #0

## 2023-04-10 MED ORDER — SODIUM CHLORIDE 0.9% FLUSH
10.0000 mL | Freq: Once | INTRAVENOUS | Status: AC
  Administered 2023-04-10: 10 mL

## 2023-04-10 MED ORDER — HEPARIN SOD (PORK) LOCK FLUSH 100 UNIT/ML IV SOLN
500.0000 [IU] | Freq: Once | INTRAVENOUS | Status: AC | PRN
  Administered 2023-04-10: 500 [IU]

## 2023-04-10 MED ORDER — PROCHLORPERAZINE MALEATE 10 MG PO TABS
10.0000 mg | ORAL_TABLET | Freq: Once | ORAL | Status: AC
  Administered 2023-04-10: 10 mg via ORAL
  Filled 2023-04-10: qty 1

## 2023-04-10 MED ORDER — ZOLEDRONIC ACID 4 MG/100ML IV SOLN
4.0000 mg | Freq: Once | INTRAVENOUS | Status: AC
  Administered 2023-04-10: 4 mg via INTRAVENOUS
  Filled 2023-04-10: qty 100

## 2023-04-10 MED ORDER — SODIUM CHLORIDE 0.9 % IV SOLN
500.0000 mg/m2 | Freq: Once | INTRAVENOUS | Status: AC
  Administered 2023-04-10: 1000 mg via INTRAVENOUS
  Filled 2023-04-10: qty 40

## 2023-04-10 MED ORDER — OXYCODONE HCL ER 20 MG PO T12A
20.0000 mg | EXTENDED_RELEASE_TABLET | Freq: Two times a day (BID) | ORAL | 0 refills | Status: DC
Start: 2023-04-19 — End: 2023-05-21
  Filled 2023-04-19: qty 60, 30d supply, fill #0

## 2023-04-10 MED ORDER — SODIUM CHLORIDE 0.9% FLUSH
10.0000 mL | INTRAVENOUS | Status: DC | PRN
  Administered 2023-04-10: 10 mL

## 2023-04-10 MED ORDER — SODIUM CHLORIDE 0.9 % IV SOLN
200.0000 mg | Freq: Once | INTRAVENOUS | Status: AC
  Administered 2023-04-10: 200 mg via INTRAVENOUS
  Filled 2023-04-10: qty 200

## 2023-04-10 MED ORDER — SODIUM CHLORIDE 0.9 % IV SOLN: Freq: Once | INTRAVENOUS | Status: AC

## 2023-04-10 NOTE — Progress Notes (Signed)
[facility] [phone]   Fax:(336) [phone]  OFFICE PROGRESS NOTE  Tracey Harries, MD [address] [place] [address]  DIAGNOSIS:  1) Stage IV (T3, N2, M1 C) non-small cell lung cancer, adenocarcinoma presented with large right upper lobe lung mass in addition to right hilar and subcarinal lymphadenopathy in addition to multiple metastatic bone lesions involving the pelvis as well as the lower thoracic and lumbar spines in addition to metastatic disease to the pelvic muscles diagnosed in February 2024.  2) deep venous thrombosis of the right lower extremity diagnosed on December 18, 2022  Detected Alteration(s) / Biomarker(s) Associated FDA-approved therapies Clinical Trial Availability% cfDNA or Amplification  KRAS G12C approved by FDA Adagrasib, Sotorasib Yes 29.3%  CDK4 Amplification None Yes High (+++)Plasma Copy Number: 8.0  PD-L1 expression 98%  PRIOR THERAPY: Palliative radiotherapy to the painful bone metastasis under the care of Dr. Mitzi Hansen  CURRENT THERAPY:  1) Systemic chemotherapy with carboplatin for AUC of 5, Alimta 500 Mg/M2 and Keytruda 200 Mg IV every 3 weeks.  First dose November 14, 2022.  Status post 7 cycles.  Starting from cycle #5 the patient is on maintenance treatment with Alimta and Keytruda every 3 weeks. 2) Eliquis 10 mg p.o. twice daily for 7 days followed by 5 mg p.o. twice daily.  First dose 12/26/2022.  INTERVAL HISTORY: Maxwell Grant 57 y.o. male returns to the clinic today for follow-up visit accompanied by his sister.  The patient is feeling fine today with no concerning complaints except for fatigue and swelling in the right lower extremity.  He is currently on Eliquis for deep venous thrombosis.  He denied having any chest pain but has shortness of breath with exertion with no cough or hemoptysis.  He uses oxygen only at home at nighttime.  The patient has no recent weight loss or night sweats.  He has no  nausea, vomiting, diarrhea or constipation.  He is here today for evaluation before starting cycle #8 of his treatment.   MEDICAL HISTORY: Past Medical History:  Diagnosis Date   CHF (congestive heart failure) (HCC)    COPD (chronic obstructive pulmonary disease) (HCC)    Diabetes mellitus without complication (HCC)    Dyspnea    Hypertension    Lung cancer (HCC) 10/2022   Right leg DVT (HCC) 12/21/2022    ALLERGIES:  is allergic to statins, aspirin, flexeril [cyclobenzaprine], and penicillins.  MEDICATIONS:  Current Outpatient Medications  Medication Sig Dispense Refill   apixaban (ELIQUIS) 5 MG TABS tablet Take 1 tablet (5 mg total) by mouth 2 (two) times daily. 60 tablet 2   Accu-Chek FastClix Lancets MISC Use to test blood sugar 4 times a day 102 each 0   albuterol (VENTOLIN HFA) 108 (90 Base) MCG/ACT inhaler Inhale 1-2 puffs into the lungs every 6 (six) hours as needed for wheezing or shortness of breath. 18 g 3   Fluticasone-Umeclidin-Vilant (TRELEGY ELLIPTA) 100-62.5-25 MCG/ACT AEPB Inhale 1 puff into the lungs daily in the afternoon. 60 each 5   folic acid (FOLVITE) 1 MG tablet TAKE 1 TABLET BY MOUTH EVERY DAY 90 tablet 1   furosemide (LASIX) 20 MG tablet Take 1 tablet (20 mg) by mouth daily as needed. 7 tablet 0   glimepiride (AMARYL) 2 MG tablet Take 2 mg by mouth in the morning.     lidocaine-prilocaine (EMLA) cream Apply to [facility] Prior to Access as needed (Patient not taking: Reported on 01/16/2023) 30  g 2   meloxicam (MOBIC) 7.5 MG tablet Take 1 tablet (7.5 mg total) by mouth daily. 30 tablet 2   ondansetron (ZOFRAN) 8 MG tablet Take 1 tablet (8 mg) by mouth every 8 hours as needed for nausea or vomiting. 30 tablet 2   oxyCODONE (OXYCONTIN) 20 mg 12 hr tablet Take 1 tablet (20 mg) by mouth every 12 hours as needed 60 tablet 0   oxyCODONE-acetaminophen (PERCOCET) 7.5-325 MG tablet Take 1 tablet by mouth every 6 hours as needed for severe pain. 60 tablet 0   prochlorperazine  (COMPAZINE) 10 MG tablet Take 1 tablet (10 mg total) by mouth every 6 (six) hours as needed for nausea or vomiting. 30 tablet 0   No current facility-administered medications for this visit.    SURGICAL HISTORY:  Past Surgical History:  Procedure Laterality Date   BRONCHIAL NEEDLE ASPIRATION BIOPSY  10/30/2022   Procedure: BRONCHIAL NEEDLE ASPIRATION BIOPSIES;  Surgeon: Leslye Peer, MD;  Location: [facility] ENDOSCOPY;  Service: Cardiopulmonary;;   IR IMAGING GUIDED PORT INSERTION  11/17/2022   NO PAST SURGERIES     RADIOLOGY WITH ANESTHESIA  10/30/2022   Procedure: MRI WITH ANESTHESIA W/WO CONTRAST;  Surgeon: Leslye Peer, MD;  Location: [facility] ENDOSCOPY;  Service: Cardiopulmonary;;   VIDEO BRONCHOSCOPY WITH ENDOBRONCHIAL ULTRASOUND Right 10/30/2022   Procedure: VIDEO BRONCHOSCOPY WITH ENDOBRONCHIAL ULTRASOUND;  Surgeon: Leslye Peer, MD;  Location: [facility] ENDOSCOPY;  Service: Cardiopulmonary;  Laterality: Right;    REVIEW OF SYSTEMS:  A comprehensive review of systems was negative except for: Respiratory: positive for dyspnea on exertion   PHYSICAL EXAMINATION: General appearance: alert, cooperative, and no distress Head: Normocephalic, without obvious abnormality, atraumatic Neck: no adenopathy, no JVD, supple, symmetrical, trachea midline, and thyroid not enlarged, symmetric, no tenderness/mass/nodules Lymph nodes: Cervical, supraclavicular, and axillary nodes normal. Resp: clear to auscultation bilaterally Back: symmetric, no curvature. ROM normal. No CVA tenderness. Cardio: regular rate and rhythm, S1, S2 normal, no murmur, click, rub or gallop GI: soft, non-tender; bowel sounds normal; no masses,  no organomegaly Extremities: extremities normal, atraumatic, no cyanosis or edema  ECOG PERFORMANCE STATUS: 1 - Symptomatic but completely ambulatory  Blood pressure 119/74, pulse 72, temperature 98.4 F (36.9 C), temperature source Oral, resp. rate 16, height 5\' 6"  (1.676 m), weight 195 lb  (88.5 kg), SpO2 92%.  LABORATORY DATA: Lab Results  Component Value Date   WBC 5.5 04/10/2023   HGB 13.0 04/10/2023   HCT 40.9 04/10/2023   MCV 102.3 (H) 04/10/2023   PLT 209 04/10/2023      Chemistry      Component Value Date/Time   NA 139 03/20/2023 0852   K 4.4 03/20/2023 0852   CL 102 03/20/2023 0852   CO2 32 03/20/2023 0852   BUN 14 03/20/2023 0852   CREATININE 0.79 03/20/2023 0852      Component Value Date/Time   CALCIUM 8.9 03/20/2023 0852   ALKPHOS 92 03/20/2023 0852   AST 14 (L) 03/20/2023 0852   ALT 10 03/20/2023 0852   BILITOT 0.7 03/20/2023 0852       RADIOGRAPHIC STUDIES: CT Chest W Contrast  Result Date: 03/19/2023 CLINICAL DATA:  Non-small-cell lung cancer. Restaging. * Tracking Code: BO * EXAM: CT CHEST, ABDOMEN, AND PELVIS WITH CONTRAST TECHNIQUE: Multidetector CT imaging of the chest, abdomen and pelvis was performed following the standard protocol during bolus administration of intravenous contrast. RADIATION DOSE REDUCTION: This exam was performed according to the departmental dose-optimization program which includes automated exposure control,  adjustment of the mA and/or kV according to patient size and/or use of iterative reconstruction technique. CONTRAST:  OMNIPAQUE IOHEXOL 300 MG/ML  SOLN COMPARISON:  01/12/2023 FINDINGS: CT CHEST FINDINGS Cardiovascular: The heart size is normal. No substantial pericardial effusion. Coronary artery calcification is evident. Mild atherosclerotic calcification is noted in the wall of the thoracic aorta. Right Port-A-Cath tip is positioned in the distal SVC. Mediastinum/Nodes: No mediastinal lymphadenopathy. There is no hilar lymphadenopathy. The esophagus has normal imaging features. There is no axillary lymphadenopathy. Lungs/Pleura: Centrilobular and paraseptal emphysema evident. Right upper lobe pulmonary lesion measures 3.9 x 3.8 cm today compared to 4.3 x 4.0 cm previously. No new suspicious pulmonary nodule or  mass. Similar atelectasis or scarring in the lingula. Musculoskeletal: Lytic lesions in the manubrium and spine are compatible with metastases, similar to prior with pathologic fracture in the manubrium as well as the T8 and T9 vertebral bodies, similar to prior. Lesions in the T10 and T11 vertebral bodies are similar prior. CT ABDOMEN PELVIS FINDINGS Hepatobiliary: No suspicious focal abnormality within the liver parenchyma. There is no evidence for gallstones, gallbladder wall thickening, or pericholecystic fluid. No intrahepatic or extrahepatic biliary dilation. Pancreas: No focal mass lesion. No dilatation of the main duct. No intraparenchymal cyst. No peripancreatic edema. Spleen: No splenomegaly. No suspicious focal mass lesion. Adrenals/Urinary Tract: No adrenal nodule or mass. Small hypoattenuating lesions in both kidneys cannot be definitively characterized but are likely cysts. No followup imaging is recommended. No evidence for hydroureter. Mild anterior bladder wall thickening evident. Stomach/Bowel: Stomach is unremarkable. No gastric wall thickening. No evidence of outlet obstruction. Duodenum is normally positioned as is the ligament of Treitz. No small bowel wall thickening. No small bowel dilatation. The terminal ileum is normal. The appendix is normal. No gross colonic mass. No colonic wall thickening. Vascular/Lymphatic: There is moderate atherosclerotic calcification of the abdominal aorta without aneurysm. There is no gastrohepatic or hepatoduodenal ligament lymphadenopathy. No retroperitoneal or mesenteric lymphadenopathy. No pelvic sidewall lymphadenopathy. Reproductive: The prostate gland and seminal vesicles are unremarkable. Other: No intraperitoneal free fluid. Musculoskeletal: Large destructive lesion in the left iliac bone including pathologic fracture is similar to prior. Left sacral lesion and anterior right iliac lesion also similar to prior. IMPRESSION: 1. Slight interval decrease in  size of the right upper lobe pulmonary lesion. 2. No new or progressive findings in the chest, abdomen, or pelvis. 3. Similar appearance of lytic lesions in the manubrium, spine, and pelvis with pathologic fractures in the manubrium, T8, and T9 vertebral bodies, and left iliac bone. 4. Similar nodular opacity in the lingula likely scarring. Attention on follow-up warranted. 5. Aortic Atherosclerosis (ICD10-I70.0) and Emphysema (ICD10-J43.9). Electronically Signed   By: Kennith Center M.D.   On: 03/19/2023 11:06   CT Abdomen Pelvis W Contrast  Result Date: 03/19/2023 CLINICAL DATA:  Non-small-cell lung cancer. Restaging. * Tracking Code: BO * EXAM: CT CHEST, ABDOMEN, AND PELVIS WITH CONTRAST TECHNIQUE: Multidetector CT imaging of the chest, abdomen and pelvis was performed following the standard protocol during bolus administration of intravenous contrast. RADIATION DOSE REDUCTION: This exam was performed according to the departmental dose-optimization program which includes automated exposure control, adjustment of the mA and/or kV according to patient size and/or use of iterative reconstruction technique. CONTRAST:  OMNIPAQUE IOHEXOL 300 MG/ML  SOLN COMPARISON:  01/12/2023 FINDINGS: CT CHEST FINDINGS Cardiovascular: The heart size is normal. No substantial pericardial effusion. Coronary artery calcification is evident. Mild atherosclerotic calcification is noted in the wall of the thoracic  aorta. Right Port-A-Cath tip is positioned in the distal SVC. Mediastinum/Nodes: No mediastinal lymphadenopathy. There is no hilar lymphadenopathy. The esophagus has normal imaging features. There is no axillary lymphadenopathy. Lungs/Pleura: Centrilobular and paraseptal emphysema evident. Right upper lobe pulmonary lesion measures 3.9 x 3.8 cm today compared to 4.3 x 4.0 cm previously. No new suspicious pulmonary nodule or mass. Similar atelectasis or scarring in the lingula. Musculoskeletal: Lytic lesions in the  manubrium and spine are compatible with metastases, similar to prior with pathologic fracture in the manubrium as well as the T8 and T9 vertebral bodies, similar to prior. Lesions in the T10 and T11 vertebral bodies are similar prior. CT ABDOMEN PELVIS FINDINGS Hepatobiliary: No suspicious focal abnormality within the liver parenchyma. There is no evidence for gallstones, gallbladder wall thickening, or pericholecystic fluid. No intrahepatic or extrahepatic biliary dilation. Pancreas: No focal mass lesion. No dilatation of the main duct. No intraparenchymal cyst. No peripancreatic edema. Spleen: No splenomegaly. No suspicious focal mass lesion. Adrenals/Urinary Tract: No adrenal nodule or mass. Small hypoattenuating lesions in both kidneys cannot be definitively characterized but are likely cysts. No followup imaging is recommended. No evidence for hydroureter. Mild anterior bladder wall thickening evident. Stomach/Bowel: Stomach is unremarkable. No gastric wall thickening. No evidence of outlet obstruction. Duodenum is normally positioned as is the ligament of Treitz. No small bowel wall thickening. No small bowel dilatation. The terminal ileum is normal. The appendix is normal. No gross colonic mass. No colonic wall thickening. Vascular/Lymphatic: There is moderate atherosclerotic calcification of the abdominal aorta without aneurysm. There is no gastrohepatic or hepatoduodenal ligament lymphadenopathy. No retroperitoneal or mesenteric lymphadenopathy. No pelvic sidewall lymphadenopathy. Reproductive: The prostate gland and seminal vesicles are unremarkable. Other: No intraperitoneal free fluid. Musculoskeletal: Large destructive lesion in the left iliac bone including pathologic fracture is similar to prior. Left sacral lesion and anterior right iliac lesion also similar to prior. IMPRESSION: 1. Slight interval decrease in size of the right upper lobe pulmonary lesion. 2. No new or progressive findings in the  chest, abdomen, or pelvis. 3. Similar appearance of lytic lesions in the manubrium, spine, and pelvis with pathologic fractures in the manubrium, T8, and T9 vertebral bodies, and left iliac bone. 4. Similar nodular opacity in the lingula likely scarring. Attention on follow-up warranted. 5. Aortic Atherosclerosis (ICD10-I70.0) and Emphysema (ICD10-J43.9). Electronically Signed   By: Kennith Center M.D.   On: 03/19/2023 11:06    ASSESSMENT AND PLAN: This is a very pleasant 57 years old white male with stage IV (T3, N2, M1 C) non-small cell lung cancer, adenocarcinoma presented with large right upper lobe lung mass in addition to right hilar and subcarinal lymphadenopathy in addition to multiple metastatic bone lesions involving the pelvis as well as the lower thoracic and lumbar spines in addition to metastatic disease to the pelvic muscles diagnosed in February 2024.  Molecular studies showed positive KRAS G12C mutation and PD-L1 expression of 98%. The patient is here today to start the first cycle of systemic chemotherapy with carboplatin for AUC of 5, Alimta 500 Mg/M2 and Keytruda 200 Mg IV every 3 weeks.  First dose November 14, 2022.  Status post 7 cycles.  Starting from cycle #5 the patient is on maintenance treatment with Alimta and Keytruda every 3 weeks. He has been tolerating this treatment well with no concerning adverse effects. I recommended for him to proceed with cycle #8 today as planned. For the right lower extremity pain and swelling, he is currently on Eliquis for deep  venous thrombosis and if it continues to get worse, I will consider him for repeat Doppler of the right lower extremity. For the pain management, he is followed by the palliative care team. He will come back for follow-up visit in 3 weeks for evaluation before the next cycle of his treatment. The patient was advised to call immediately if he has any other concerning symptoms in the interval. The patient voices understanding  of current disease status and treatment options and is in agreement with the current care plan.  All questions were answered. The patient knows to call the clinic with any problems, questions or concerns. We can certainly see the patient much sooner if necessary.  The total time spent in the appointment was 20 minutes.  Disclaimer: This note was dictated with voice recognition software. Similar sounding words can inadvertently be transcribed and may not be corrected upon review.

## 2023-04-10 NOTE — Progress Notes (Signed)
Palliative Medicine [facility]  Telephone:(336) [phone] Fax:(336) [phone]   Name: Maxwell Grant Date: 04/10/2023 MRN: [id number]  DOB: 02/29/1966  Patient Care Team: Tracey Harries, MD as PCP - General (Family Medicine)    INTERVAL HISTORY: Maxwell Grant is a 57 y.o. male with  oncologic medical history including new diagnosed non-small cell lung cancer (10/2022), as well as a history of COPD, diabetes mellitus, hypertension, dyslipidemia as well as history of osteomyelitis of the back.  Palliative ask to see for symptom and pain management and goals of care.     SOCIAL HISTORY:    Maxwell Grant reports that he has been smoking cigarettes. He has never used smokeless tobacco. He reports current alcohol use of about 1.0 standard drink of alcohol per week. He reports current drug use. Drug: Marijuana.  ADVANCE DIRECTIVES: none on file   CODE STATUS: Full Code  PAST MEDICAL HISTORY: Past Medical History:  Diagnosis Date   CHF (congestive heart failure) (HCC)    COPD (chronic obstructive pulmonary disease) (HCC)    Diabetes mellitus without complication (HCC)    Dyspnea    Hypertension    Lung cancer (HCC) 10/2022   Right leg DVT (HCC) 12/21/2022    ALLERGIES:  is allergic to statins, aspirin, flexeril [cyclobenzaprine], and penicillins.  MEDICATIONS:  Current Outpatient Medications  Medication Sig Dispense Refill   apixaban (ELIQUIS) 5 MG TABS tablet Take 1 tablet (5 mg total) by mouth 2 (two) times daily. 60 tablet 2   Accu-Chek FastClix Lancets MISC Use to test blood sugar 4 times a day 102 each 0   albuterol (VENTOLIN HFA) 108 (90 Base) MCG/ACT inhaler Inhale 1-2 puffs into the lungs every 6 (six) hours as needed for wheezing or shortness of breath. 18 g 3   Fluticasone-Umeclidin-Vilant (TRELEGY ELLIPTA) 100-62.5-25 MCG/ACT AEPB Inhale 1 puff into the lungs daily in the afternoon. 60 each 5   folic acid (FOLVITE) 1 MG tablet TAKE 1 TABLET BY MOUTH  EVERY DAY 90 tablet 1   furosemide (LASIX) 20 MG tablet Take 1 tablet (20 mg) by mouth daily as needed. 7 tablet 0   glimepiride (AMARYL) 2 MG tablet Take 2 mg by mouth in the morning.     lidocaine-prilocaine (EMLA) cream Apply to [facility] Prior to Access as needed (Patient not taking: Reported on 01/16/2023) 30 g 2   meloxicam (MOBIC) 7.5 MG tablet Take 1 tablet (7.5 mg total) by mouth daily. 30 tablet 2   ondansetron (ZOFRAN) 8 MG tablet Take 1 tablet (8 mg) by mouth every 8 hours as needed for nausea or vomiting. 30 tablet 2   oxyCODONE (OXYCONTIN) 20 mg 12 hr tablet Take 1 tablet (20 mg) by mouth every 12 hours as needed 60 tablet 0   oxyCODONE-acetaminophen (PERCOCET) 7.5-325 MG tablet Take 1 tablet by mouth every 6 hours as needed for severe pain. 60 tablet 0   prochlorperazine (COMPAZINE) 10 MG tablet Take 1 tablet (10 mg total) by mouth every 6 (six) hours as needed for nausea or vomiting. 30 tablet 0   No current facility-administered medications for this visit.    VITAL SIGNS: There were no vitals taken for this visit. There were no vitals filed for this visit.  Estimated body mass index is 30.97 kg/m as calculated from the following:   Height as of 01/01/23: 5\' 6"  (1.676 m).   Weight as of 03/20/23: 191 lb 14.4 oz (87 kg).   PERFORMANCE STATUS (ECOG) : 2 -  Symptomatic, <50% confined to bed   Physical Exam General: NAD Cardiovascular: regular rate and rhythm Pulmonary: normal breathing pattern  Abdomen: soft, nontender Extremities: no joint deformities, right lower extremity trace edema Skin: no rashes Neurological: oriented x 4   IMPRESSION: Maxwell Grant presents to clinic today for follow-up. No acute distress. In wheelchair. Is remaining active. Shares he is able to go outside and mow the lown on riding mower. Enjoys being outside. Denies nausea, vomiting, constipation, or diarrhea.   Continues to have some intermittent leg swelling. This does improve when he elevates  extremity.   Neoplasm related pain Maxwell Grant reports pain overall is well controlled. Some days are better than others. We discuss importance to listening to his body as he has become more active and not overdoing things. He verbalized understanding.   We discussed current regimen. He is taking OxyContin 20mg  very 12 hours and Percocet as needed for breakthrough pain. Not requiring around the clock. Mobic daily. Is taking medications as prescribed. This is evident by refill request.   We will continue to closely monitor and adjust medications as needed.     We discussed the importance of continued conversation with family and their medical providers regarding overall plan of care and treatment options, ensuring decisions are within the context of the patients values and GOCs.  PLAN: OxyContin 20 mg every 12 hours.  Oxycodone 7.5/325 -15mg  every 6 hours as needed for breakthrough pain. Not requiring around the clock.  Mobic 7.5 mg daily. MiraLAX twice daily. Colace daily. Ongoing symptom management support and goals of care discussions. Palliative will plan to see patient back in 3-4 weeks in collaboration to other oncology appointments.  Knows to contact office sooner if needed.   Patient expressed understanding and was in agreement with this plan. He also understands that He can call the clinic at any time with any questions, concerns, or complaints.    Any controlled substances utilized were prescribed in the context of palliative care. PDMP has been reviewed.   Visit consisted of counseling and education dealing with the complex and emotionally intense issues of symptom management and palliative care in the setting of serious and potentially life-threatening illness.Greater than 50%  of this time was spent counseling and coordinating care related to the above assessment and plan.  [name], AGPCNP-BC  Palliative Medicine Team/[facility]  *Please note  that this is a verbal dictation therefore any spelling or grammatical errors are due to the "[employer] One" system interpretation.

## 2023-04-17 ENCOUNTER — Other Ambulatory Visit: Payer: Self-pay

## 2023-04-17 ENCOUNTER — Other Ambulatory Visit: Payer: Self-pay | Admitting: Nurse Practitioner

## 2023-04-17 DIAGNOSIS — G893 Neoplasm related pain (acute) (chronic): Secondary | ICD-10-CM

## 2023-04-17 DIAGNOSIS — Z515 Encounter for palliative care: Secondary | ICD-10-CM

## 2023-04-17 DIAGNOSIS — C3490 Malignant neoplasm of unspecified part of unspecified bronchus or lung: Secondary | ICD-10-CM

## 2023-04-18 ENCOUNTER — Other Ambulatory Visit: Payer: Self-pay | Admitting: Nurse Practitioner

## 2023-04-18 ENCOUNTER — Other Ambulatory Visit (HOSPITAL_COMMUNITY): Payer: Self-pay

## 2023-04-18 ENCOUNTER — Telehealth: Payer: Self-pay

## 2023-04-18 NOTE — Telephone Encounter (Signed)
Request for a refill on percocet was sent in via Mychart for this pt, however the medication was just filled 7 days prior to request. This RN attempted to call pt to clarify the situation, no answer form pt but pt sister, who is also the pt caregiver, reports that the wrong refill request was sent in and pt still has percocet pills available at home, and when she gets home she will confirm with pt the correct medication to refill. No further needs at this time.

## 2023-04-20 ENCOUNTER — Other Ambulatory Visit (HOSPITAL_COMMUNITY): Payer: Self-pay

## 2023-04-30 NOTE — Progress Notes (Unsigned)
Palliative Medicine [facility]  Telephone:([phone]) [phone] Fax:(336) [phone]   Name: Maxwell Grant Date: 04/30/2023 MRN: [id number]  DOB: 05-06-1966  Patient Care Team: Tracey Harries, MD as PCP - General (Family Medicine)    INTERVAL HISTORY: Maxwell Grant is a 57 y.o. male with  oncologic medical history including new diagnosed non-small cell lung cancer (10/2022), as well as a history of COPD, diabetes mellitus, hypertension, dyslipidemia as well as history of osteomyelitis of the back.  Palliative ask to see for symptom and pain management and goals of care.     SOCIAL HISTORY:    Maxwell Grant reports that he has been smoking cigarettes. He has never used smokeless tobacco. He reports current alcohol use of about 1.0 standard drink of alcohol per week. He reports current drug use. Drug: Marijuana.  ADVANCE DIRECTIVES: none on file   CODE STATUS: Full Code  PAST MEDICAL HISTORY: Past Medical History:  Diagnosis Date   CHF (congestive heart failure) (HCC)    COPD (chronic obstructive pulmonary disease) (HCC)    Diabetes mellitus without complication (HCC)    Dyspnea    Hypertension    Lung cancer (HCC) 10/2022   Right leg DVT (HCC) 12/21/2022    ALLERGIES:  is allergic to statins, aspirin, flexeril [cyclobenzaprine], and penicillins.  MEDICATIONS:  Current Outpatient Medications  Medication Sig Dispense Refill   apixaban (ELIQUIS) 5 MG TABS tablet Take 1 tablet (5 mg total) by mouth 2 (two) times daily. 60 tablet 2   Accu-Chek FastClix Lancets MISC Use to test blood sugar 4 times a day 102 each 0   albuterol (VENTOLIN HFA) 108 (90 Base) MCG/ACT inhaler Inhale 1-2 puffs into the lungs every 6 (six) hours as needed for wheezing or shortness of breath. 18 g 3   Fluticasone-Umeclidin-Vilant (TRELEGY ELLIPTA) 100-62.5-25 MCG/ACT AEPB Inhale 1 puff into the lungs daily in the afternoon. 60 each 5   folic acid (FOLVITE) 1 MG tablet TAKE 1 TABLET BY MOUTH  EVERY DAY 90 tablet 1   furosemide (LASIX) 20 MG tablet Take 1 tablet (20 mg) by mouth daily as needed. 7 tablet 0   glimepiride (AMARYL) 2 MG tablet Take 2 mg by mouth in the morning.     lidocaine-prilocaine (EMLA) cream Apply to [facility] Prior to Access as needed (Patient not taking: Reported on 01/16/2023) 30 g 2   meloxicam (MOBIC) 7.5 MG tablet Take 1 tablet (7.5 mg total) by mouth daily. 30 tablet 2   ondansetron (ZOFRAN) 8 MG tablet Take 1 tablet (8 mg) by mouth every 8 hours as needed for nausea or vomiting. 30 tablet 2   OVER THE COUNTER MEDICATION Take 1 tablet by mouth daily as needed (constipation). " Stool softener     oxyCODONE (OXYCONTIN) 20 mg 12 hr tablet Take 1 tablet (20 mg total) by mouth every 12 (twelve) hours. 60 tablet 0   oxyCODONE-acetaminophen (PERCOCET) 7.5-325 MG tablet Take 1 tablet by mouth every 6 hours as needed for severe pain. 60 tablet 0   prochlorperazine (COMPAZINE) 10 MG tablet Take 1 tablet (10 mg total) by mouth every 6 (six) hours as needed for nausea or vomiting. 30 tablet 0   No current facility-administered medications for this visit.    VITAL SIGNS: There were no vitals taken for this visit. There were no vitals filed for this visit.  Estimated body mass index is 31.47 kg/m as calculated from the following:   Height as of 04/10/23: 5\' 6"  (1.676 m).  Weight as of 04/10/23: 195 lb (88.5 kg).   PERFORMANCE STATUS (ECOG) : 2 - Symptomatic, <50% confined to bed   Physical Exam General: NAD Cardiovascular: regular rate and rhythm Pulmonary: normal breathing pattern  Abdomen: soft, nontender Extremities: no joint deformities, right lower extremity trace edema Skin: no rashes Neurological: oriented x 4   IMPRESSION:    Neoplasm related pain Mr. Werito reports pain overall is well controlled. Some days are better than others. We discuss importance to listening to his body as he has become more active and not overdoing things. He verbalized  understanding.   We discussed current regimen. He is taking OxyContin 20mg  very 12 hours and Percocet as needed for breakthrough pain. Not requiring around the clock. Mobic daily. Is taking medications as prescribed. This is evident by refill request.   We will continue to closely monitor and adjust medications as needed.     We discussed the importance of continued conversation with family and their medical providers regarding overall plan of care and treatment options, ensuring decisions are within the context of the patients values and GOCs.  PLAN: OxyContin 20 mg every 12 hours.  Oxycodone 7.5/325 -15mg  every 6 hours as needed for breakthrough pain. Not requiring around the clock.  Mobic 7.5 mg daily. MiraLAX twice daily. Colace daily. Ongoing symptom management support and goals of care discussions. Palliative will plan to see patient back in 3-4 weeks in collaboration to other oncology appointments.  Knows to contact office sooner if needed.   Patient expressed understanding and was in agreement with this plan. He also understands that He can call the clinic at any time with any questions, concerns, or complaints.    Any controlled substances utilized were prescribed in the context of palliative care. PDMP has been reviewed.   Visit consisted of counseling and education dealing with the complex and emotionally intense issues of symptom management and palliative care in the setting of serious and potentially life-threatening illness.Greater than 50%  of this time was spent counseling and coordinating care related to the above assessment and plan.  [name], AGPCNP-BC  Palliative Medicine Team/[facility]  *Please note that this is a verbal dictation therefore any spelling or grammatical errors are due to the "Dragon Medical One" system interpretation.

## 2023-05-01 ENCOUNTER — Encounter: Payer: Self-pay | Admitting: Nurse Practitioner

## 2023-05-01 ENCOUNTER — Other Ambulatory Visit (HOSPITAL_COMMUNITY): Payer: Self-pay

## 2023-05-01 ENCOUNTER — Other Ambulatory Visit: Payer: Self-pay

## 2023-05-01 ENCOUNTER — Encounter: Payer: Self-pay | Admitting: Internal Medicine

## 2023-05-01 ENCOUNTER — Inpatient Hospital Stay: Payer: Medicaid Other

## 2023-05-01 ENCOUNTER — Inpatient Hospital Stay (HOSPITAL_BASED_OUTPATIENT_CLINIC_OR_DEPARTMENT_OTHER): Payer: Medicaid Other | Admitting: Nurse Practitioner

## 2023-05-01 ENCOUNTER — Inpatient Hospital Stay (HOSPITAL_BASED_OUTPATIENT_CLINIC_OR_DEPARTMENT_OTHER): Payer: Medicaid Other | Admitting: Internal Medicine

## 2023-05-01 ENCOUNTER — Inpatient Hospital Stay: Payer: Medicaid Other | Attending: Internal Medicine

## 2023-05-01 ENCOUNTER — Other Ambulatory Visit: Payer: Medicaid Other

## 2023-05-01 DIAGNOSIS — M2550 Pain in unspecified joint: Secondary | ICD-10-CM | POA: Diagnosis not present

## 2023-05-01 DIAGNOSIS — C3490 Malignant neoplasm of unspecified part of unspecified bronchus or lung: Secondary | ICD-10-CM

## 2023-05-01 DIAGNOSIS — M25552 Pain in left hip: Secondary | ICD-10-CM | POA: Insufficient documentation

## 2023-05-01 DIAGNOSIS — R0609 Other forms of dyspnea: Secondary | ICD-10-CM | POA: Diagnosis not present

## 2023-05-01 DIAGNOSIS — Z5111 Encounter for antineoplastic chemotherapy: Secondary | ICD-10-CM | POA: Diagnosis present

## 2023-05-01 DIAGNOSIS — Z7901 Long term (current) use of anticoagulants: Secondary | ICD-10-CM | POA: Insufficient documentation

## 2023-05-01 DIAGNOSIS — Z886 Allergy status to analgesic agent status: Secondary | ICD-10-CM | POA: Diagnosis not present

## 2023-05-01 DIAGNOSIS — F17210 Nicotine dependence, cigarettes, uncomplicated: Secondary | ICD-10-CM | POA: Insufficient documentation

## 2023-05-01 DIAGNOSIS — Z5112 Encounter for antineoplastic immunotherapy: Secondary | ICD-10-CM | POA: Diagnosis present

## 2023-05-01 DIAGNOSIS — E119 Type 2 diabetes mellitus without complications: Secondary | ICD-10-CM | POA: Diagnosis not present

## 2023-05-01 DIAGNOSIS — E785 Hyperlipidemia, unspecified: Secondary | ICD-10-CM | POA: Diagnosis not present

## 2023-05-01 DIAGNOSIS — Z515 Encounter for palliative care: Secondary | ICD-10-CM

## 2023-05-01 DIAGNOSIS — G893 Neoplasm related pain (acute) (chronic): Secondary | ICD-10-CM | POA: Diagnosis not present

## 2023-05-01 DIAGNOSIS — F1290 Cannabis use, unspecified, uncomplicated: Secondary | ICD-10-CM | POA: Insufficient documentation

## 2023-05-01 DIAGNOSIS — Z880 Allergy status to penicillin: Secondary | ICD-10-CM | POA: Insufficient documentation

## 2023-05-01 DIAGNOSIS — Z85118 Personal history of other malignant neoplasm of bronchus and lung: Secondary | ICD-10-CM | POA: Insufficient documentation

## 2023-05-01 DIAGNOSIS — Z86718 Personal history of other venous thrombosis and embolism: Secondary | ICD-10-CM | POA: Insufficient documentation

## 2023-05-01 DIAGNOSIS — C3411 Malignant neoplasm of upper lobe, right bronchus or lung: Secondary | ICD-10-CM | POA: Diagnosis present

## 2023-05-01 DIAGNOSIS — Z95828 Presence of other vascular implants and grafts: Secondary | ICD-10-CM

## 2023-05-01 DIAGNOSIS — Z888 Allergy status to other drugs, medicaments and biological substances status: Secondary | ICD-10-CM | POA: Diagnosis not present

## 2023-05-01 DIAGNOSIS — C7989 Secondary malignant neoplasm of other specified sites: Secondary | ICD-10-CM | POA: Insufficient documentation

## 2023-05-01 DIAGNOSIS — C7951 Secondary malignant neoplasm of bone: Secondary | ICD-10-CM | POA: Diagnosis not present

## 2023-05-01 LAB — CMP (CANCER CENTER ONLY)
ALT: 13 U/L (ref 0–44)
AST: 15 U/L (ref 15–41)
Albumin: 4 g/dL (ref 3.5–5.0)
Alkaline Phosphatase: 88 U/L (ref 38–126)
Anion gap: 3 — ABNORMAL LOW (ref 5–15)
BUN: 17 mg/dL (ref 6–20)
CO2: 33 mmol/L — ABNORMAL HIGH (ref 22–32)
Calcium: 8.8 mg/dL — ABNORMAL LOW (ref 8.9–10.3)
Chloride: 105 mmol/L (ref 98–111)
Creatinine: 0.79 mg/dL (ref 0.61–1.24)
GFR, Estimated: >60 mL/min (ref >60)
Glucose, Bld: 105 mg/dL — ABNORMAL HIGH (ref 70–99)
Potassium: 4.4 mmol/L (ref 3.5–5.1)
Sodium: 141 mmol/L (ref 135–145)
Total Bilirubin: 0.5 mg/dL (ref 0.3–1.2)
Total Protein: 6.8 g/dL (ref 6.5–8.1)

## 2023-05-01 LAB — CBC WITH DIFFERENTIAL (CANCER CENTER ONLY)
Abs Immature Granulocytes: 0.01 K/uL (ref 0.00–0.07)
Basophils Absolute: 0.1 K/uL (ref 0.0–0.1)
Basophils Relative: 1 %
Eosinophils Absolute: 0.4 K/uL (ref 0.0–0.5)
Eosinophils Relative: 6 %
HCT: 41.1 % (ref 39.0–52.0)
Hemoglobin: 13.1 g/dL (ref 13.0–17.0)
Immature Granulocytes: 0 %
Lymphocytes Relative: 18 %
Lymphs Abs: 1.2 K/uL (ref 0.7–4.0)
MCH: 32.1 pg (ref 26.0–34.0)
MCHC: 31.9 g/dL (ref 30.0–36.0)
MCV: 100.7 fL — ABNORMAL HIGH (ref 80.0–100.0)
Monocytes Absolute: 0.5 K/uL (ref 0.1–1.0)
Monocytes Relative: 8 %
Neutro Abs: 4.3 K/uL (ref 1.7–7.7)
Neutrophils Relative %: 67 %
Platelet Count: 216 K/uL (ref 150–400)
RBC: 4.08 MIL/uL — ABNORMAL LOW (ref 4.22–5.81)
RDW: 14.6 % (ref 11.5–15.5)
WBC Count: 6.5 K/uL (ref 4.0–10.5)
nRBC: 0 % (ref 0.0–0.2)

## 2023-05-01 LAB — TSH: TSH: 1.853 u[IU]/mL (ref 0.350–4.500)

## 2023-05-01 MED ORDER — SODIUM CHLORIDE 0.9 % IV SOLN
500.0000 mg/m2 | Freq: Once | INTRAVENOUS | Status: AC
  Administered 2023-05-01: 1000 mg via INTRAVENOUS
  Filled 2023-05-01: qty 40

## 2023-05-01 MED ORDER — PROCHLORPERAZINE MALEATE 10 MG PO TABS
10.0000 mg | ORAL_TABLET | Freq: Once | ORAL | Status: AC
  Administered 2023-05-01: 10 mg via ORAL
  Filled 2023-05-01: qty 1

## 2023-05-01 MED ORDER — HEPARIN SOD (PORK) LOCK FLUSH 100 UNIT/ML IV SOLN
500.0000 [IU] | Freq: Once | INTRAVENOUS | Status: AC | PRN
  Administered 2023-05-01: 500 [IU]

## 2023-05-01 MED ORDER — FOLIC ACID 1 MG PO TABS
1.0000 mg | ORAL_TABLET | Freq: Every day | ORAL | 1 refills | Status: DC
  Filled 2023-05-01: qty 90, 90d supply, fill #0
  Filled 2023-07-27: qty 90, 90d supply, fill #1

## 2023-05-01 MED ORDER — SODIUM CHLORIDE 0.9 % IV SOLN: Freq: Once | INTRAVENOUS | Status: AC

## 2023-05-01 MED ORDER — SODIUM CHLORIDE 0.9% FLUSH
10.0000 mL | Freq: Once | INTRAVENOUS | Status: AC
  Administered 2023-05-01: 10 mL

## 2023-05-01 MED ORDER — CYANOCOBALAMIN 1000 MCG/ML IJ SOLN
1000.0000 ug | Freq: Once | INTRAMUSCULAR | Status: AC
  Administered 2023-05-01: 1000 ug via INTRAMUSCULAR
  Filled 2023-05-01: qty 1

## 2023-05-01 MED ORDER — OXYCODONE-ACETAMINOPHEN 7.5-325 MG PO TABS
1.0000 | ORAL_TABLET | Freq: Four times a day (QID) | ORAL | 0 refills | Status: DC | PRN
Start: 2023-05-01 — End: 2023-05-22
  Filled 2023-05-01: qty 60, 15d supply, fill #0

## 2023-05-01 MED ORDER — SODIUM CHLORIDE 0.9 % IV SOLN
200.0000 mg | Freq: Once | INTRAVENOUS | Status: AC
  Administered 2023-05-01: 200 mg via INTRAVENOUS
  Filled 2023-05-01: qty 200

## 2023-05-01 MED ORDER — SODIUM CHLORIDE 0.9% FLUSH
10.0000 mL | INTRAVENOUS | Status: DC | PRN
  Administered 2023-05-01: 10 mL

## 2023-05-01 NOTE — Progress Notes (Signed)
[facility] Telephone:([phone]) [phone]   Fax:(336) [phone]  OFFICE PROGRESS NOTE  Tracey Harries, MD [address] [place] [address]  DIAGNOSIS:  1) Stage IV (T3, N2, M1 C) non-small cell lung cancer, adenocarcinoma presented with large right upper lobe lung mass in addition to right hilar and subcarinal lymphadenopathy in addition to multiple metastatic bone lesions involving the pelvis as well as the lower thoracic and lumbar spines in addition to metastatic disease to the pelvic muscles diagnosed in February 2024.  2) deep venous thrombosis of the right lower extremity diagnosed on December 18, 2022  Detected Alteration(s) / Biomarker(s) Associated FDA-approved therapies Clinical Trial Availability% cfDNA or Amplification  KRAS G12C approved by FDA Adagrasib, Sotorasib Yes 29.3%  CDK4 Amplification None Yes High (+++)Plasma Copy Number: 8.0  PD-L1 expression 98%  PRIOR THERAPY: Palliative radiotherapy to the painful bone metastasis under the care of Dr. Mitzi Hansen  CURRENT THERAPY:  1) Systemic chemotherapy with carboplatin for AUC of 5, Alimta 500 Mg/M2 and Keytruda 200 Mg IV every 3 weeks.  First dose November 14, 2022.  Status post 8 cycles.  Starting from cycle #5 the patient is on maintenance treatment with Alimta and Keytruda every 3 weeks. 2) Eliquis 10 mg p.o. twice daily for 7 days followed by 5 mg p.o. twice daily.  First dose 12/26/2022.  INTERVAL HISTORY: Maxwell Grant 57 y.o. male returns to the clinic today for follow-up visit accompanied by his sister.  The patient is feeling fine today with no concerning complaints except for the baseline shortness of breath as well as pain in the left hip area.  He denied having any chest pain, cough or hemoptysis.  He has no nausea, vomiting, diarrhea or constipation.  He has no headache or visual changes.  He has no recent weight loss or night sweats.  He continues to tolerate his treatment with  maintenance Alimta and Keytruda fairly well.  He is here for evaluation before starting cycle #9 of his treatment.  MEDICAL HISTORY: Past Medical History:  Diagnosis Date   CHF (congestive heart failure) (HCC)    COPD (chronic obstructive pulmonary disease) (HCC)    Diabetes mellitus without complication (HCC)    Dyspnea    Hypertension    Lung cancer (HCC) 10/2022   Right leg DVT (HCC) 12/21/2022    ALLERGIES:  is allergic to statins, aspirin, flexeril [cyclobenzaprine], and penicillins.  MEDICATIONS:  Current Outpatient Medications  Medication Sig Dispense Refill   apixaban (ELIQUIS) 5 MG TABS tablet Take 1 tablet (5 mg total) by mouth 2 (two) times daily. 60 tablet 2   Accu-Chek FastClix Lancets MISC Use to test blood sugar 4 times a day 102 each 0   albuterol (VENTOLIN HFA) 108 (90 Base) MCG/ACT inhaler Inhale 1-2 puffs into the lungs every 6 (six) hours as needed for wheezing or shortness of breath. 18 g 3   Fluticasone-Umeclidin-Vilant (TRELEGY ELLIPTA) 100-62.5-25 MCG/ACT AEPB Inhale 1 puff into the lungs daily in the afternoon. 60 each 5   folic acid (FOLVITE) 1 MG tablet TAKE 1 TABLET BY MOUTH EVERY DAY 90 tablet 1   furosemide (LASIX) 20 MG tablet Take 1 tablet (20 mg) by mouth daily as needed. 7 tablet 0   glimepiride (AMARYL) 2 MG tablet Take 2 mg by mouth in the morning.     lidocaine-prilocaine (EMLA) cream Apply to [facility] Prior to Access as needed (Patient not taking: Reported on 01/16/2023) 30 g 2  meloxicam (MOBIC) 7.5 MG tablet Take 1 tablet (7.5 mg total) by mouth daily. 30 tablet 2   ondansetron (ZOFRAN) 8 MG tablet Take 1 tablet (8 mg) by mouth every 8 hours as needed for nausea or vomiting. 30 tablet 2   OVER THE COUNTER MEDICATION Take 1 tablet by mouth daily as needed (constipation). " Stool softener     oxyCODONE (OXYCONTIN) 20 mg 12 hr tablet Take 1 tablet (20 mg total) by mouth every 12 (twelve) hours. 60 tablet 0   oxyCODONE-acetaminophen (PERCOCET) 7.5-325 MG  tablet Take 1 tablet by mouth every 6 hours as needed for severe pain. 60 tablet 0   prochlorperazine (COMPAZINE) 10 MG tablet Take 1 tablet (10 mg total) by mouth every 6 (six) hours as needed for nausea or vomiting. 30 tablet 0   No current facility-administered medications for this visit.    SURGICAL HISTORY:  Past Surgical History:  Procedure Laterality Date   BRONCHIAL NEEDLE ASPIRATION BIOPSY  10/30/2022   Procedure: BRONCHIAL NEEDLE ASPIRATION BIOPSIES;  Surgeon: Leslye Peer, MD;  Location: [facility] ENDOSCOPY;  Service: Cardiopulmonary;;   IR IMAGING GUIDED PORT INSERTION  11/17/2022   NO PAST SURGERIES     RADIOLOGY WITH ANESTHESIA  10/30/2022   Procedure: MRI WITH ANESTHESIA W/WO CONTRAST;  Surgeon: Leslye Peer, MD;  Location: [facility] ENDOSCOPY;  Service: Cardiopulmonary;;   VIDEO BRONCHOSCOPY WITH ENDOBRONCHIAL ULTRASOUND Right 10/30/2022   Procedure: VIDEO BRONCHOSCOPY WITH ENDOBRONCHIAL ULTRASOUND;  Surgeon: Leslye Peer, MD;  Location: [facility] ENDOSCOPY;  Service: Cardiopulmonary;  Laterality: Right;    REVIEW OF SYSTEMS:  A comprehensive review of systems was negative except for: Respiratory: positive for dyspnea on exertion Musculoskeletal: positive for arthralgias   PHYSICAL EXAMINATION: General appearance: alert, cooperative, and no distress Head: Normocephalic, without obvious abnormality, atraumatic Neck: no adenopathy, no JVD, supple, symmetrical, trachea midline, and thyroid not enlarged, symmetric, no tenderness/mass/nodules Lymph nodes: Cervical, supraclavicular, and axillary nodes normal. Resp: clear to auscultation bilaterally Back: symmetric, no curvature. ROM normal. No CVA tenderness. Cardio: regular rate and rhythm, S1, S2 normal, no murmur, click, rub or gallop GI: soft, non-tender; bowel sounds normal; no masses,  no organomegaly Extremities: extremities normal, atraumatic, no cyanosis or edema  ECOG PERFORMANCE STATUS: 1 - Symptomatic but completely  ambulatory  Blood pressure 108/74, pulse 69, temperature 98.5 F (36.9 C), temperature source Oral, resp. rate 16, height 5\' 6"  (1.676 m), weight 193 lb 9.6 oz (87.8 kg), SpO2 93%.  LABORATORY DATA: Lab Results  Component Value Date   WBC 6.5 05/01/2023   HGB 13.1 05/01/2023   HCT 41.1 05/01/2023   MCV 100.7 (H) 05/01/2023   PLT 216 05/01/2023      Chemistry      Component Value Date/Time   NA 141 05/01/2023 1101   K 4.4 05/01/2023 1101   CL 105 05/01/2023 1101   CO2 33 (H) 05/01/2023 1101   BUN 17 05/01/2023 1101   CREATININE 0.79 05/01/2023 1101      Component Value Date/Time   CALCIUM 8.8 (L) 05/01/2023 1101   ALKPHOS 88 05/01/2023 1101   AST 15 05/01/2023 1101   ALT 13 05/01/2023 1101   BILITOT 0.5 05/01/2023 1101       RADIOGRAPHIC STUDIES: No results found.  ASSESSMENT AND PLAN: This is a very pleasant 57 years old white male with stage IV (T3, N2, M1 C) non-small cell lung cancer, adenocarcinoma presented with large right upper lobe lung mass in addition to right hilar and subcarinal lymphadenopathy  in addition to multiple metastatic bone lesions involving the pelvis as well as the lower thoracic and lumbar spines in addition to metastatic disease to the pelvic muscles diagnosed in February 2024.  Molecular studies showed positive KRAS G12C mutation and PD-L1 expression of 98%. The patient is here today to start the first cycle of systemic chemotherapy with carboplatin for AUC of 5, Alimta 500 Mg/M2 and Keytruda 200 Mg IV every 3 weeks.  First dose November 14, 2022.  Status post 8 cycles.  Starting from cycle #5 the patient is on maintenance treatment with Alimta and Keytruda every 3 weeks. The patient has been tolerating his maintenance treatment fairly well with no concerning adverse effects. I recommended for him to proceed with cycle #9 today as planned. I will see the patient back for follow-up visit in 3 weeks for evaluation with repeat CT scan of the chest,  abdomen and pelvis for restaging of his disease. For the pain management, he is followed by the palliative care team. The patient was advised to call immediately if he has any other concerning symptoms in the interval. The patient voices understanding of current disease status and treatment options and is in agreement with the current care plan.  All questions were answered. The patient knows to call the clinic with any problems, questions or concerns. We can certainly see the patient much sooner if necessary.  The total time spent in the appointment was 20 minutes.  Disclaimer: This note was dictated with voice recognition software. Similar sounding words can inadvertently be transcribed and may not be corrected upon review.

## 2023-05-01 NOTE — Patient Instructions (Signed)
[facility]  Discharge Instructions: Thank you for choosing [facility] to provide your oncology and hematology care.   If you have a lab appointment with the [facility], please go directly to the [facility] and check in at the registration area.   Wear comfortable clothing and clothing appropriate for easy access to any Portacath or PICC line.   We strive to give you quality time with your provider. You may need to reschedule your appointment if you arrive late (15 or more minutes).  Arriving late affects you and other patients whose appointments are after yours.  Also, if you miss three or more appointments without notifying the office, you may be dismissed from the clinic at the provider's discretion.      For prescription refill requests, have your pharmacy contact our office and allow 72 hours for refills to be completed.    Today you received the following chemotherapy and/or immunotherapy agents: pembrolizumab and pemetrexed      To help prevent nausea and vomiting after your treatment, we encourage you to take your nausea medication as directed.  BELOW ARE SYMPTOMS THAT SHOULD BE REPORTED IMMEDIATELY: *FEVER GREATER THAN 100.4 F (38 C) OR HIGHER *CHILLS OR SWEATING *NAUSEA AND VOMITING THAT IS NOT CONTROLLED WITH YOUR NAUSEA MEDICATION *UNUSUAL SHORTNESS OF BREATH *UNUSUAL BRUISING OR BLEEDING *URINARY PROBLEMS (pain or burning when urinating, or frequent urination) *BOWEL PROBLEMS (unusual diarrhea, constipation, pain near the anus) TENDERNESS IN MOUTH AND THROAT WITH OR WITHOUT PRESENCE OF ULCERS (sore throat, sores in mouth, or a toothache) UNUSUAL RASH, SWELLING OR PAIN  UNUSUAL VAGINAL DISCHARGE OR ITCHING   Items with * indicate a potential emergency and should be followed up as soon as possible or go to the Emergency Department if any problems should occur.  Please show the CHEMOTHERAPY ALERT CARD or IMMUNOTHERAPY  ALERT CARD at check-in to the Emergency Department and triage nurse.  Should you have questions after your visit or need to cancel or reschedule your appointment, please contact [facility]  Dept: [phone]  and follow the prompts.  Office hours are 8:00 a.m. to 4:30 p.m. Monday - Friday. Please note that voicemails left after 4:00 p.m. may not be returned until the following business day.  We are closed weekends and major holidays. You have access to a nurse at all times for urgent questions. Please call the main number to the clinic Dept: [phone] and follow the prompts.   For any non-urgent questions, you may also contact your provider using MyChart. We now offer e-Visits for anyone 43 and older to request care online for non-urgent symptoms. For details visit [email or web address].   Also download the MyChart app! Go to the app store, search "MyChart", open the app, select [facility], and log in with your MyChart username and password.

## 2023-05-02 ENCOUNTER — Other Ambulatory Visit (HOSPITAL_COMMUNITY): Payer: Self-pay

## 2023-05-04 ENCOUNTER — Other Ambulatory Visit (HOSPITAL_COMMUNITY): Payer: Self-pay

## 2023-05-04 ENCOUNTER — Other Ambulatory Visit: Payer: Self-pay | Admitting: Nurse Practitioner

## 2023-05-04 ENCOUNTER — Other Ambulatory Visit: Payer: Self-pay | Admitting: Pulmonary Disease

## 2023-05-04 DIAGNOSIS — M25552 Pain in left hip: Secondary | ICD-10-CM

## 2023-05-04 DIAGNOSIS — Z515 Encounter for palliative care: Secondary | ICD-10-CM

## 2023-05-04 DIAGNOSIS — C3490 Malignant neoplasm of unspecified part of unspecified bronchus or lung: Secondary | ICD-10-CM

## 2023-05-04 MED ORDER — MELOXICAM 7.5 MG PO TABS
7.5000 mg | ORAL_TABLET | Freq: Every day | ORAL | 2 refills | Status: DC
  Filled 2023-05-04: qty 30, 30d supply, fill #0
  Filled 2023-05-30: qty 30, 30d supply, fill #1
  Filled 2023-06-29: qty 30, 30d supply, fill #2

## 2023-05-04 NOTE — Telephone Encounter (Signed)
LOV 01/01/23 Last refill 02/06/23, #60, 0 rf  Patient has not had this medication since June.  Ok to restart?  Thank you!

## 2023-05-05 ENCOUNTER — Other Ambulatory Visit (HOSPITAL_COMMUNITY): Payer: Self-pay

## 2023-05-10 ENCOUNTER — Other Ambulatory Visit (HOSPITAL_COMMUNITY): Payer: Self-pay

## 2023-05-10 ENCOUNTER — Other Ambulatory Visit: Payer: Self-pay

## 2023-05-10 MED ORDER — APIXABAN 5 MG PO TABS
5.0000 mg | ORAL_TABLET | Freq: Two times a day (BID) | ORAL | 2 refills | Status: DC
  Filled 2023-05-10: qty 60, 30d supply, fill #0
  Filled 2023-06-12 (×2): qty 60, 30d supply, fill #1
  Filled 2023-07-12: qty 60, 30d supply, fill #2

## 2023-05-15 ENCOUNTER — Other Ambulatory Visit: Payer: Self-pay | Admitting: Internal Medicine

## 2023-05-15 ENCOUNTER — Ambulatory Visit (HOSPITAL_COMMUNITY)
Admission: RE | Admit: 2023-05-15 | Discharge: 2023-05-15 | Disposition: A | Discharge status: Home / Self Care | Payer: Medicaid Other | Source: Ambulatory Visit | Attending: Internal Medicine | Admitting: Internal Medicine

## 2023-05-15 DIAGNOSIS — C3490 Malignant neoplasm of unspecified part of unspecified bronchus or lung: Secondary | ICD-10-CM | POA: Insufficient documentation

## 2023-05-15 MED ORDER — IOHEXOL 300 MG/ML  SOLN
100.0000 mL | Freq: Once | INTRAMUSCULAR | Status: AC | PRN
  Administered 2023-05-15: 100 mL via INTRAVENOUS

## 2023-05-15 MED ORDER — HEPARIN SOD (PORK) LOCK FLUSH 100 UNIT/ML IV SOLN
500.0000 [IU] | Freq: Once | INTRAVENOUS | Status: AC
  Administered 2023-05-15: 500 [IU] via INTRAVENOUS

## 2023-05-15 MED ORDER — HEPARIN SOD (PORK) LOCK FLUSH 100 UNIT/ML IV SOLN
INTRAVENOUS | Status: AC
  Filled 2023-05-15: qty 5

## 2023-05-18 ENCOUNTER — Other Ambulatory Visit: Payer: Self-pay | Admitting: Physician Assistant

## 2023-05-18 NOTE — Progress Notes (Unsigned)
[facility] OFFICE PROGRESS NOTE  Tracey Harries, MD [address] [place] [address]  DIAGNOSIS:  1) Stage IV (T3, N2, M1 C) non-small cell lung cancer, adenocarcinoma presented with large right upper lobe lung mass in addition to right hilar and subcarinal lymphadenopathy in addition to multiple metastatic bone lesions involving the pelvis as well as the lower thoracic and lumbar spines in addition to metastatic disease to the pelvic muscles diagnosed in February 2024.  2) deep venous thrombosis of the right lower extremity diagnosed on December 18, 2022   Detected Alteration(s) / Biomarker(s)         Associated FDA-approved therapies  Clinical Trial Availability% cfDNA or Amplification   KRAS G12C approved by FDA Adagrasib, Sotorasib Yes    29.3%   CDK4 Amplification None Yes            High (+++)Plasma Copy Number: 8.0   PD-L1 expression 98%  PRIOR THERAPY: Palliative radiotherapy to the painful bone metastasis under the care of Dr. Mitzi Hansen   CURRENT THERAPY:  1) Systemic chemotherapy with carboplatin for AUC of 5, Alimta 500 Mg/M2 and Keytruda 200 Mg IV every 3 weeks.  First dose November 14, 2022.  Status post 9 cycles.  Working from cycle #5 the patient has been on maintenance treatment with Alimta and Keytruda every 3 weeks. 2) Eliquis 10 mg p.o. twice daily for 7 days followed by 5 mg p.o. twice daily.  First dose 12/26/2022.  INTERVAL HISTORY: Maxwell Grant 57 y.o. male returns to the clinic for a follow up visit accompanied by his sister. He was last seen by Dr. Arbutus Ped 3 weeks ago. He is currently on maintenance Alimta and Keytruda.  He tolerates it fairly well any major adverse side effects except for he feels fatigued a few days that week after treatment. He is followed closely by the palliative care team.  He is scheduled to see palliative care today. He is currently on OxyContin 20 mg every 12 hours, oxycodone 7.5 mg 3x per day, Mobic, MiraLAX, and  Colace.  He is currently on Eliquis for history of DVT in the past.   He reports his fatigue is stable. He denies any fever or unexplained weight loss. He sometimes alternates between being hot and cold at night. His appetite is about the same. He reports some stable dyspnea on exertion. Denies any chest pain or hemoptysis.  He has an intermittent chronic cough which he also states is the same.  He continues to smoke cigarettes. He averages 1 ppd. He denies any significant nausea with treatment. Denies any vomiting, diarrhea, or constipation.  Denies any headache or visual changes.  Denies any rashes or skin changes except for dry skin. He recently had a restaging CT scan performed.  He is here today for evaluation and to review his scan results before starting cycle number 10  MEDICAL HISTORY: Past Medical History:  Diagnosis Date   CHF (congestive heart failure) (HCC)    COPD (chronic obstructive pulmonary disease) (HCC)    Diabetes mellitus without complication (HCC)    Dyspnea    Hypertension    Lung cancer (HCC) 10/2022   Right leg DVT (HCC) 12/21/2022    ALLERGIES:  is allergic to statins, aspirin, flexeril [cyclobenzaprine], and penicillins.  MEDICATIONS:  Current Outpatient Medications  Medication Sig Dispense Refill   Accu-Chek FastClix Lancets MISC Use to test blood sugar 4 times a day 102 each 0   albuterol (VENTOLIN HFA) 108 (90  Base) MCG/ACT inhaler Inhale 1-2 puffs into the lungs every 6 (six) hours as needed for wheezing or shortness of breath. 18 g 3   apixaban (ELIQUIS) 5 MG TABS tablet Take 1 tablet (5 mg total) by mouth 2 (two) times daily. 60 tablet 2   Fluticasone-Umeclidin-Vilant (TRELEGY ELLIPTA) 100-62.5-25 MCG/ACT AEPB Inhale 1 puff into the lungs daily in the afternoon. 60 each 5   folic acid (FOLVITE) 1 MG tablet Take 1 tablet (1 mg total) by mouth daily. 90 tablet 1   furosemide (LASIX) 20 MG tablet Take 1 tablet (20 mg) by mouth daily as needed. 7 tablet 0    glimepiride (AMARYL) 2 MG tablet Take 2 mg by mouth in the morning.     lidocaine-prilocaine (EMLA) cream Apply to [facility] Prior to Access as needed (Patient not taking: Reported on 01/16/2023) 30 g 2   meloxicam (MOBIC) 7.5 MG tablet Take 1 tablet (7.5 mg total) by mouth daily. 30 tablet 2   ondansetron (ZOFRAN) 8 MG tablet Take 1 tablet (8 mg) by mouth every 8 hours as needed for nausea or vomiting. 30 tablet 2   OVER THE COUNTER MEDICATION Take 1 tablet by mouth daily as needed (constipation). " Stool softener     oxyCODONE (OXYCONTIN) 20 mg 12 hr tablet Take 1 tablet (20 mg total) by mouth every 12 (twelve) hours. 60 tablet 0   oxyCODONE-acetaminophen (PERCOCET) 7.5-325 MG tablet Take 1 tablet by mouth every 6 hours as needed for severe pain. 60 tablet 0   prochlorperazine (COMPAZINE) 10 MG tablet Take 1 tablet (10 mg total) by mouth every 6 (six) hours as needed for nausea or vomiting. 30 tablet 0   No current facility-administered medications for this visit.    SURGICAL HISTORY:  Past Surgical History:  Procedure Laterality Date   BRONCHIAL NEEDLE ASPIRATION BIOPSY  10/30/2022   Procedure: BRONCHIAL NEEDLE ASPIRATION BIOPSIES;  Surgeon: Leslye Peer, MD;  Location: [facility] ENDOSCOPY;  Service: Cardiopulmonary;;   IR IMAGING GUIDED PORT INSERTION  11/17/2022   NO PAST SURGERIES     RADIOLOGY WITH ANESTHESIA  10/30/2022   Procedure: MRI WITH ANESTHESIA W/WO CONTRAST;  Surgeon: Leslye Peer, MD;  Location: [facility] ENDOSCOPY;  Service: Cardiopulmonary;;   VIDEO BRONCHOSCOPY WITH ENDOBRONCHIAL ULTRASOUND Right 10/30/2022   Procedure: VIDEO BRONCHOSCOPY WITH ENDOBRONCHIAL ULTRASOUND;  Surgeon: Leslye Peer, MD;  Location: [facility] ENDOSCOPY;  Service: Cardiopulmonary;  Laterality: Right;    REVIEW OF SYSTEMS:   Review of Systems  Constitutional: Positive for fatigue.  Negative for appetite change, chills, fever and unexpected weight change.  HENT: Negative for mouth sores, nosebleeds, sore throat and  trouble swallowing.   Eyes: Negative for eye problems and icterus.  Respiratory: Positive for baseline dyspnea on exertion and intermittent cough.  Negative for hemoptysis and wheezing.     Cardiovascular: Negative for chest pain and leg swelling.  Gastrointestinal: Negative for abdominal pain, constipation, diarrhea, nausea and vomiting.  Genitourinary: Negative for bladder incontinence, difficulty urinating, dysuria, frequency and hematuria.   Musculoskeletal: Positive for cancer related bone pain. Negative for gait problem, neck pain and neck stiffness.  Skin: Negative for itching and rash.  Neurological: Negative for dizziness, extremity weakness, gait problem, headaches, light-headedness and seizures.  Hematological: Negative for adenopathy. Does not bruise/bleed easily.  Psychiatric/Behavioral: Negative for confusion, depression and sleep disturbance. The patient is not nervous/anxious.     PHYSICAL EXAMINATION:  There were no vitals taken for this visit.  ECOG PERFORMANCE STATUS: 2  Physical Exam  Constitutional: Oriented to person, place, and time and well-developed, well-nourished, and in no distress.  HENT:  Head: Normocephalic and atraumatic.  Mouth/Throat: Oropharynx is clear and moist. No oropharyngeal exudate.  Eyes: Conjunctivae are normal. Right eye exhibits no discharge. Left eye exhibits no discharge. No scleral icterus.  Neck: Normal range of motion. Neck supple.  Cardiovascular: Normal rate, regular rhythm, normal heart sounds and intact distal pulses.   Pulmonary/Chest: Effort normal. Quiet breath sounds bilaterally. No respiratory distress. No wheezes. No rales.  Abdominal: Soft. Bowel sounds are normal. Exhibits no distension and no mass. There is no tenderness.  Musculoskeletal: Normal range of motion. Exhibits no significant lower extremity edema, just dry skin.  Lymphadenopathy:    No cervical adenopathy.  Neurological: Alert and oriented to person, place, and  time. Exhibits muscle wasting. Examined in the wheelchair.  Skin: Skin is warm and dry. No rash noted. Not diaphoretic. No erythema. No pallor.  Psychiatric: Mood, memory and judgment normal.  Vitals reviewed.  LABORATORY DATA: Lab Results  Component Value Date   WBC 6.5 05/01/2023   HGB 13.1 05/01/2023   HCT 41.1 05/01/2023   MCV 100.7 (H) 05/01/2023   PLT 216 05/01/2023      Chemistry      Component Value Date/Time   NA 141 05/01/2023 1101   K 4.4 05/01/2023 1101   CL 105 05/01/2023 1101   CO2 33 (H) 05/01/2023 1101   BUN 17 05/01/2023 1101   CREATININE 0.79 05/01/2023 1101      Component Value Date/Time   CALCIUM 8.8 (L) 05/01/2023 1101   ALKPHOS 88 05/01/2023 1101   AST 15 05/01/2023 1101   ALT 13 05/01/2023 1101   BILITOT 0.5 05/01/2023 1101       RADIOGRAPHIC STUDIES:  No results found.   ASSESSMENT/PLAN:  This is a very pleasant 57 years old Caucasian male with stage IV (T3, N2, M1 C) non-small cell lung cancer, adenocarcinoma presented with large right upper lobe lung mass in addition to right hilar and subcarinal lymphadenopathy in addition to multiple metastatic bone lesions involving the pelvis as well as the lower thoracic and lumbar spines in addition to metastatic disease to the pelvic muscles diagnosed in February 2024.  Molecular studies showed positive KRAS G12C mutation and PD-L1 expression of 98%.     The patient is here today to start the first cycle of systemic chemotherapy with carboplatin for AUC of 5, Alimta 500 Mg/M2 and Keytruda 200 Mg IV every 3 weeks.  First dose November 14, 2022.  Status post 9 cycles.  Starting from cycle #5 the patient is on maintenance treatment with Alimta and Keytruda every 3 weeks.    The patient was seen with Dr. Arbutus Ped today.  Dr. Arbutus Ped personally and independently reviewed the scan and discussed results with the patient today.  The scan showed no evidence of disease progression.  Dr. Arbutus Ped recommends he  continue on the same treatment at the same dose.   We will see him back for follow-up visit in 3 weeks for evaluation repeat blood work before undergoing cycle #11.  He will continue follow-up with palliative care for pain management. He is scheduled to see them today.   He will continue on his blood thinner for his history of DVT.   The patient was advised to call immediately if she has any concerning symptoms in the interval. The patient voices understanding of current disease status and treatment options and is in agreement with the current care plan.  All questions were answered. The patient knows to call the clinic with any problems, questions or concerns. We can certainly see the patient much sooner if necessary. No orders of the defined types were placed in this encounter.     Maxwell Kafer L Jericka Kadar, PA-C 05/18/23  ADDENDUM: Hematology/Oncology Attending: This is a very pleasant 57 years old white male with a stage IV non-small cell lung cancer, adenocarcinoma diagnosed in February 2024.  He also has deep venous thrombosis of the right lower extremity in April 2024.  Molecular studies showed positive KRAS G12C mutation and PD-L1 expression was 98%.  The patient underwent palliative radiotherapy to the painful bone metastasis initially.  He then started induction systemic chemotherapy with carboplatin, Alimta and Keytruda for 4 cycles followed by 5 more cycles of maintenance treatment with Alimta and Keytruda and he has been tolerating this treatment fairly well. He had repeat CT scan of the chest, abdomen and pelvis performed recently.  I personally and independently reviewed the scan and discussed the result with the patient and his sister. His scan showed no concerning findings for disease progression. I recommended for him to continue his current treatment with maintenance Alimta and Keytruda and he will proceed with cycle #10 today. For the chronic back pain and bone metastasis  he is followed by the palliative care team. The patient will come back for follow-up visit in 3 weeks for evaluation before the next cycle of his treatment. He was advised to call immediately if he has any concerning symptoms in the interval. The total time spent in the appointment was 30 minutes. Disclaimer: This note was dictated with voice recognition software. Similar sounding words can inadvertently be transcribed and may be missed upon review.  Lajuana Matte, MD

## 2023-05-21 ENCOUNTER — Other Ambulatory Visit: Payer: Self-pay | Admitting: Nurse Practitioner

## 2023-05-21 DIAGNOSIS — Z515 Encounter for palliative care: Secondary | ICD-10-CM

## 2023-05-21 DIAGNOSIS — G893 Neoplasm related pain (acute) (chronic): Secondary | ICD-10-CM

## 2023-05-21 DIAGNOSIS — C3490 Malignant neoplasm of unspecified part of unspecified bronchus or lung: Secondary | ICD-10-CM

## 2023-05-22 ENCOUNTER — Inpatient Hospital Stay: Payer: Medicaid Other | Attending: Internal Medicine

## 2023-05-22 ENCOUNTER — Inpatient Hospital Stay (HOSPITAL_BASED_OUTPATIENT_CLINIC_OR_DEPARTMENT_OTHER): Payer: Medicaid Other | Admitting: Nurse Practitioner

## 2023-05-22 ENCOUNTER — Other Ambulatory Visit: Payer: Self-pay | Admitting: Nurse Practitioner

## 2023-05-22 ENCOUNTER — Inpatient Hospital Stay: Payer: Medicaid Other

## 2023-05-22 ENCOUNTER — Encounter: Payer: Self-pay | Admitting: Internal Medicine

## 2023-05-22 ENCOUNTER — Other Ambulatory Visit (HOSPITAL_COMMUNITY): Payer: Self-pay

## 2023-05-22 ENCOUNTER — Inpatient Hospital Stay (HOSPITAL_BASED_OUTPATIENT_CLINIC_OR_DEPARTMENT_OTHER): Payer: Medicaid Other | Admitting: Physician Assistant

## 2023-05-22 VITALS — BP 147/83 | HR 68 | Temp 98.1°F | Resp 13 | Wt 195.1 lb

## 2023-05-22 VITALS — BP 130/86 | HR 73 | Resp 16

## 2023-05-22 DIAGNOSIS — I700 Atherosclerosis of aorta: Secondary | ICD-10-CM | POA: Diagnosis not present

## 2023-05-22 DIAGNOSIS — Z5111 Encounter for antineoplastic chemotherapy: Secondary | ICD-10-CM | POA: Insufficient documentation

## 2023-05-22 DIAGNOSIS — Z86718 Personal history of other venous thrombosis and embolism: Secondary | ICD-10-CM | POA: Diagnosis not present

## 2023-05-22 DIAGNOSIS — M549 Dorsalgia, unspecified: Secondary | ICD-10-CM | POA: Diagnosis not present

## 2023-05-22 DIAGNOSIS — C3411 Malignant neoplasm of upper lobe, right bronchus or lung: Secondary | ICD-10-CM | POA: Diagnosis present

## 2023-05-22 DIAGNOSIS — Z7901 Long term (current) use of anticoagulants: Secondary | ICD-10-CM | POA: Diagnosis not present

## 2023-05-22 DIAGNOSIS — Z5112 Encounter for antineoplastic immunotherapy: Secondary | ICD-10-CM | POA: Diagnosis present

## 2023-05-22 DIAGNOSIS — I2510 Atherosclerotic heart disease of native coronary artery without angina pectoris: Secondary | ICD-10-CM | POA: Diagnosis not present

## 2023-05-22 DIAGNOSIS — Z886 Allergy status to analgesic agent status: Secondary | ICD-10-CM | POA: Diagnosis not present

## 2023-05-22 DIAGNOSIS — Z79899 Other long term (current) drug therapy: Secondary | ICD-10-CM | POA: Diagnosis not present

## 2023-05-22 DIAGNOSIS — Z515 Encounter for palliative care: Secondary | ICD-10-CM

## 2023-05-22 DIAGNOSIS — C7951 Secondary malignant neoplasm of bone: Secondary | ICD-10-CM | POA: Diagnosis not present

## 2023-05-22 DIAGNOSIS — Z28310 Unvaccinated for covid-19: Secondary | ICD-10-CM | POA: Diagnosis not present

## 2023-05-22 DIAGNOSIS — Z79620 Long term (current) use of immunosuppressive biologic: Secondary | ICD-10-CM | POA: Diagnosis not present

## 2023-05-22 DIAGNOSIS — I10 Essential (primary) hypertension: Secondary | ICD-10-CM | POA: Diagnosis not present

## 2023-05-22 DIAGNOSIS — G893 Neoplasm related pain (acute) (chronic): Secondary | ICD-10-CM

## 2023-05-22 DIAGNOSIS — C3490 Malignant neoplasm of unspecified part of unspecified bronchus or lung: Secondary | ICD-10-CM | POA: Diagnosis not present

## 2023-05-22 DIAGNOSIS — F1290 Cannabis use, unspecified, uncomplicated: Secondary | ICD-10-CM | POA: Diagnosis not present

## 2023-05-22 DIAGNOSIS — M79606 Pain in leg, unspecified: Secondary | ICD-10-CM | POA: Diagnosis not present

## 2023-05-22 DIAGNOSIS — Z888 Allergy status to other drugs, medicaments and biological substances status: Secondary | ICD-10-CM | POA: Insufficient documentation

## 2023-05-22 DIAGNOSIS — M954 Acquired deformity of chest and rib: Secondary | ICD-10-CM | POA: Insufficient documentation

## 2023-05-22 DIAGNOSIS — J432 Centrilobular emphysema: Secondary | ICD-10-CM | POA: Diagnosis not present

## 2023-05-22 DIAGNOSIS — R530 Neoplastic (malignant) related fatigue: Secondary | ICD-10-CM

## 2023-05-22 DIAGNOSIS — J449 Chronic obstructive pulmonary disease, unspecified: Secondary | ICD-10-CM | POA: Insufficient documentation

## 2023-05-22 DIAGNOSIS — Z95828 Presence of other vascular implants and grafts: Secondary | ICD-10-CM

## 2023-05-22 DIAGNOSIS — Z79631 Long term (current) use of antimetabolite agent: Secondary | ICD-10-CM | POA: Diagnosis not present

## 2023-05-22 DIAGNOSIS — Z9981 Dependence on supplemental oxygen: Secondary | ICD-10-CM | POA: Insufficient documentation

## 2023-05-22 DIAGNOSIS — Z880 Allergy status to penicillin: Secondary | ICD-10-CM | POA: Insufficient documentation

## 2023-05-22 DIAGNOSIS — R630 Anorexia: Secondary | ICD-10-CM

## 2023-05-22 DIAGNOSIS — F17210 Nicotine dependence, cigarettes, uncomplicated: Secondary | ICD-10-CM | POA: Diagnosis not present

## 2023-05-22 LAB — CMP (CANCER CENTER ONLY)
ALT: 12 U/L (ref 0–44)
AST: 15 U/L (ref 15–41)
Albumin: 4.2 g/dL (ref 3.5–5.0)
Alkaline Phosphatase: 81 U/L (ref 38–126)
Anion gap: 4 — ABNORMAL LOW (ref 5–15)
BUN: 17 mg/dL (ref 6–20)
CO2: 34 mmol/L — ABNORMAL HIGH (ref 22–32)
Calcium: 9.4 mg/dL (ref 8.9–10.3)
Chloride: 102 mmol/L (ref 98–111)
Creatinine: 0.82 mg/dL (ref 0.61–1.24)
GFR, Estimated: >60 mL/min (ref >60)
Glucose, Bld: 84 mg/dL (ref 70–99)
Potassium: 4.4 mmol/L (ref 3.5–5.1)
Sodium: 140 mmol/L (ref 135–145)
Total Bilirubin: 0.7 mg/dL (ref 0.3–1.2)
Total Protein: 7.1 g/dL (ref 6.5–8.1)

## 2023-05-22 LAB — CBC WITH DIFFERENTIAL (CANCER CENTER ONLY)
Abs Immature Granulocytes: 0.01 10*3/uL (ref 0.00–0.07)
Basophils Absolute: 0.1 10*3/uL (ref 0.0–0.1)
Basophils Relative: 1 %
Eosinophils Absolute: 0.5 10*3/uL (ref 0.0–0.5)
Eosinophils Relative: 8 %
HCT: 42 % (ref 39.0–52.0)
Hemoglobin: 13.9 g/dL (ref 13.0–17.0)
Immature Granulocytes: 0 %
Lymphocytes Relative: 17 %
Lymphs Abs: 1.1 10*3/uL (ref 0.7–4.0)
MCH: 32.8 pg (ref 26.0–34.0)
MCHC: 33.1 g/dL (ref 30.0–36.0)
MCV: 99.1 fL (ref 80.0–100.0)
Monocytes Absolute: 0.6 10*3/uL (ref 0.1–1.0)
Monocytes Relative: 9 %
Neutro Abs: 4.3 10*3/uL (ref 1.7–7.7)
Neutrophils Relative %: 65 %
Platelet Count: 201 10*3/uL (ref 150–400)
RBC: 4.24 MIL/uL (ref 4.22–5.81)
RDW: 15 % (ref 11.5–15.5)
WBC Count: 6.6 10*3/uL (ref 4.0–10.5)
nRBC: 0 % (ref 0.0–0.2)

## 2023-05-22 MED ORDER — PROCHLORPERAZINE MALEATE 10 MG PO TABS: 10.0000 mg | ORAL_TABLET | Freq: Once | ORAL | Status: DC

## 2023-05-22 MED ORDER — SODIUM CHLORIDE 0.9 % IV SOLN
500.0000 mg/m2 | Freq: Once | INTRAVENOUS | Status: AC
  Administered 2023-05-22: 1000 mg via INTRAVENOUS
  Filled 2023-05-22: qty 40

## 2023-05-22 MED ORDER — OXYCODONE-ACETAMINOPHEN 7.5-325 MG PO TABS
1.0000 | ORAL_TABLET | Freq: Four times a day (QID) | ORAL | 0 refills | Status: DC | PRN
  Filled 2023-05-22: qty 60, 15d supply, fill #0

## 2023-05-22 MED ORDER — SODIUM CHLORIDE 0.9 % IV SOLN
200.0000 mg | Freq: Once | INTRAVENOUS | Status: AC
  Administered 2023-05-22: 200 mg via INTRAVENOUS
  Filled 2023-05-22: qty 200

## 2023-05-22 MED ORDER — OXYCODONE HCL ER 20 MG PO T12A
20.0000 mg | EXTENDED_RELEASE_TABLET | Freq: Two times a day (BID) | ORAL | 0 refills | Status: DC
  Filled 2023-05-22: qty 60, 30d supply, fill #0

## 2023-05-22 MED ORDER — SODIUM CHLORIDE 0.9% FLUSH
10.0000 mL | Freq: Once | INTRAVENOUS | Status: AC
  Administered 2023-05-22: 10 mL

## 2023-05-22 MED ORDER — HEPARIN SOD (PORK) LOCK FLUSH 100 UNIT/ML IV SOLN
500.0000 [IU] | Freq: Once | INTRAVENOUS | Status: AC | PRN
  Administered 2023-05-22: 500 [IU]

## 2023-05-22 MED ORDER — SODIUM CHLORIDE 0.9% FLUSH
10.0000 mL | INTRAVENOUS | Status: DC | PRN
  Administered 2023-05-22: 10 mL

## 2023-05-22 MED ORDER — SODIUM CHLORIDE 0.9 % IV SOLN: Freq: Once | INTRAVENOUS | Status: AC

## 2023-05-22 NOTE — Progress Notes (Signed)
Palliative Medicine [facility]  Telephone:([phone] Fax:([phone]) [phone]   Name: Maxwell Grant Date: 05/22/2023 MRN: [id number]  DOB: 12/06/1965  Patient Care Team: Tracey Harries, MD as PCP - General (Family Medicine)    INTERVAL HISTORY: Maxwell Grant is a 57 y.o. male with  oncologic medical history including new diagnosed non-small cell lung cancer (10/2022), as well as a history of COPD, diabetes mellitus, hypertension, dyslipidemia as well as history of osteomyelitis of the back.  Palliative ask to see for symptom and pain management and goals of care.     SOCIAL HISTORY:    Maxwell Grant reports that he has been smoking cigarettes. He has never used smokeless tobacco. He reports current alcohol use of about 1.0 standard drink of alcohol per week. He reports current drug use. Drug: Marijuana.  ADVANCE DIRECTIVES: none on file   CODE STATUS: Full Code  PAST MEDICAL HISTORY: Past Medical History:  Diagnosis Date   CHF (congestive heart failure) (HCC)    COPD (chronic obstructive pulmonary disease) (HCC)    Diabetes mellitus without complication (HCC)    Dyspnea    Hypertension    Lung cancer (HCC) 10/2022   Right leg DVT (HCC) 12/21/2022    ALLERGIES:  is allergic to statins, aspirin, flexeril [cyclobenzaprine], and penicillins.  MEDICATIONS:  Current Outpatient Medications  Medication Sig Dispense Refill   Accu-Chek FastClix Lancets MISC Use to test blood sugar 4 times a day 102 each 0   albuterol (VENTOLIN HFA) 108 (90 Base) MCG/ACT inhaler Inhale 1-2 puffs into the lungs every 6 (six) hours as needed for wheezing or shortness of breath. 18 g 3   apixaban (ELIQUIS) 5 MG TABS tablet Take 1 tablet (5 mg total) by mouth 2 (two) times daily. 60 tablet 2   Fluticasone-Umeclidin-Vilant (TRELEGY ELLIPTA) 100-62.5-25 MCG/ACT AEPB Inhale 1 puff into the lungs daily in the afternoon. 60 each 5   folic acid (FOLVITE) 1 MG tablet Take 1 tablet (1 mg total)  by mouth daily. 90 tablet 1   furosemide (LASIX) 20 MG tablet Take 1 tablet (20 mg) by mouth daily as needed. 7 tablet 0   glimepiride (AMARYL) 2 MG tablet Take 2 mg by mouth in the morning.     lidocaine-prilocaine (EMLA) cream Apply to [facility] [place] Prior to Access as needed (Patient not taking: Reported on 01/16/2023) 30 g 2   meloxicam (MOBIC) 7.5 MG tablet Take 1 tablet (7.5 mg total) by mouth daily. 30 tablet 2   ondansetron (ZOFRAN) 8 MG tablet Take 1 tablet (8 mg) by mouth every 8 hours as needed for nausea or vomiting. 30 tablet 2   OVER THE COUNTER MEDICATION Take 1 tablet by mouth daily as needed (constipation). " Stool softener     oxyCODONE (OXYCONTIN) 20 mg 12 hr tablet Take 1 tablet (20 mg total) by mouth every 12 (twelve) hours. 60 tablet 0   oxyCODONE-acetaminophen (PERCOCET) 7.5-325 MG tablet Take 1 tablet by mouth every 6 hours as needed for severe pain. 60 tablet 0   prochlorperazine (COMPAZINE) 10 MG tablet Take 1 tablet (10 mg total) by mouth every 6 (six) hours as needed for nausea or vomiting. 30 tablet 0   No current facility-administered medications for this visit.    VITAL SIGNS: There were no vitals taken for this visit. There were no vitals filed for this visit.  Estimated body mass index is 31.25 kg/m as calculated from the following:   Height as of 05/01/23: 5\' 6"  (  1.676 m).   Weight as of 05/01/23: 193 lb 9.6 oz (87.8 kg).   PERFORMANCE STATUS (ECOG) : 2 - Symptomatic, <50% confined to bed   Physical Exam General: NAD Cardiovascular: regular rate and rhythm Skin: no rashes Neurological: oriented x 4   IMPRESSION: Maxwell Grant presents to clinic for follow-up. No acute distress. Family present. Is doing well overall. Appetite is good. Remaining active as possible. Denies nausea, vomiting, constipation, or diarrhea.   Neoplasm related pain Maxwell Grant reports pain overall is well controlled. Some days are better than others.   We discussed current regimen.  He is taking OxyContin 20mg  very 12 hours and Percocet as needed for breakthrough pain. Not requiring around the clock. Mobic daily. Is taking medications as prescribed. This is evident by refill request.   We will continue to closely monitor and adjust medications as needed.     We discussed the importance of continued conversation with family and their medical providers regarding overall plan of care and treatment options, ensuring decisions are within the context of the patients values and GOCs.  PLAN: OxyContin 20 mg every 12 hours.  Oxycodone 7.5/325 -15mg  every 6 hours as needed for breakthrough pain. Not requiring around the clock.  Mobic 7.5 mg daily. MiraLAX twice daily. Colace daily. Ongoing symptom management support and goals of care discussions. Palliative will plan to see patient back in 3-4 weeks in collaboration to other oncology appointments.  Knows to contact office sooner if needed.   Patient expressed understanding and was in agreement with this plan. He also understands that He can call the clinic at any time with any questions, concerns, or complaints.    Any controlled substances utilized were prescribed in the context of palliative care. PDMP has been reviewed.   Visit consisted of counseling and education dealing with the complex and emotionally intense issues of symptom management and palliative care in the setting of serious and potentially life-threatening illness.Greater than 50%  of this time was spent counseling and coordinating care related to the above assessment and plan.  [name], AGPCNP-BC  Palliative Medicine Team/[facility]  *Please note that this is a verbal dictation therefore any spelling or grammatical errors are due to the "[employer]agon [employer] One" system interpretation.

## 2023-05-22 NOTE — Patient Instructions (Signed)
[facility]  Discharge Instructions: Thank you for choosing [facility] to provide your oncology and hematology care.   If you have a lab appointment with the Cancer Center, please go directly to the Cancer Center and check in at the registration area.   Wear comfortable clothing and clothing appropriate for easy access to any Portacath or PICC line.   We strive to give you quality time with your provider. You may need to reschedule your appointment if you arrive late (15 or more minutes).  Arriving late affects you and other patients whose appointments are after yours.  Also, if you miss three or more appointments without notifying the office, you may be dismissed from the clinic at the provider's discretion.      For prescription refill requests, have your pharmacy contact our office and allow 72 hours for refills to be completed.    Today you received the following chemotherapy and/or immunotherapy agents: Keytruda/Alimta      To help prevent nausea and vomiting after your treatment, we encourage you to take your nausea medication as directed.  BELOW ARE SYMPTOMS THAT SHOULD BE REPORTED IMMEDIATELY: *FEVER GREATER THAN 100.4 F (38 C) OR HIGHER *CHILLS OR SWEATING *NAUSEA AND VOMITING THAT IS NOT CONTROLLED WITH YOUR NAUSEA MEDICATION *UNUSUAL SHORTNESS OF BREATH *UNUSUAL BRUISING OR BLEEDING *URINARY PROBLEMS (pain or burning when urinating, or frequent urination) *BOWEL PROBLEMS (unusual diarrhea, constipation, pain near the anus) TENDERNESS IN MOUTH AND THROAT WITH OR WITHOUT PRESENCE OF ULCERS (sore throat, sores in mouth, or a toothache) UNUSUAL RASH, SWELLING OR PAIN  UNUSUAL VAGINAL DISCHARGE OR ITCHING   Items with * indicate a potential emergency and should be followed up as soon as possible or go to the Emergency Department if any problems should occur.  Please show the CHEMOTHERAPY ALERT CARD or IMMUNOTHERAPY ALERT CARD at  check-in to the Emergency Department and triage nurse.  Should you have questions after your visit or need to cancel or reschedule your appointment, please contact [facility]  Dept: [phone]  and follow the prompts.  Office hours are 8:00 a.m. to 4:30 p.m. Monday - Friday. Please note that voicemails left after 4:00 p.m. may not be returned until the following business day.  We are closed weekends and major holidays. You have access to a nurse at all times for urgent questions. Please call the main number to the clinic Dept: [phone] and follow the prompts.   For any non-urgent questions, you may also contact your provider using MyChart. We now offer e-Visits for anyone 18 and older to request care online for non-urgent symptoms. For details visit [email or web address].   Also download the MyChart app! Go to the app store, search "MyChart", open the app, select , and log in with your MyChart username and password.   

## 2023-05-23 ENCOUNTER — Encounter: Payer: Self-pay | Admitting: Nurse Practitioner

## 2023-05-23 ENCOUNTER — Encounter: Payer: Self-pay | Admitting: Internal Medicine

## 2023-05-30 ENCOUNTER — Encounter: Payer: Self-pay | Admitting: Internal Medicine

## 2023-05-31 ENCOUNTER — Encounter: Payer: Self-pay | Admitting: Internal Medicine

## 2023-05-31 ENCOUNTER — Other Ambulatory Visit (HOSPITAL_COMMUNITY): Payer: Self-pay

## 2023-06-05 ENCOUNTER — Other Ambulatory Visit: Payer: Self-pay | Admitting: Internal Medicine

## 2023-06-05 DIAGNOSIS — C3490 Malignant neoplasm of unspecified part of unspecified bronchus or lung: Secondary | ICD-10-CM

## 2023-06-12 ENCOUNTER — Other Ambulatory Visit (HOSPITAL_COMMUNITY): Payer: Self-pay

## 2023-06-12 ENCOUNTER — Inpatient Hospital Stay: Payer: Medicaid Other

## 2023-06-12 ENCOUNTER — Other Ambulatory Visit (HOSPITAL_BASED_OUTPATIENT_CLINIC_OR_DEPARTMENT_OTHER): Payer: Self-pay

## 2023-06-12 ENCOUNTER — Inpatient Hospital Stay (HOSPITAL_BASED_OUTPATIENT_CLINIC_OR_DEPARTMENT_OTHER): Payer: Medicaid Other | Admitting: Internal Medicine

## 2023-06-12 DIAGNOSIS — C3490 Malignant neoplasm of unspecified part of unspecified bronchus or lung: Secondary | ICD-10-CM

## 2023-06-12 DIAGNOSIS — Z5112 Encounter for antineoplastic immunotherapy: Secondary | ICD-10-CM | POA: Diagnosis not present

## 2023-06-12 LAB — CBC WITH DIFFERENTIAL/PLATELET
Abs Immature Granulocytes: 0.02 10*3/uL (ref 0.00–0.07)
Basophils Absolute: 0.1 10*3/uL (ref 0.0–0.1)
Basophils Relative: 1 %
Eosinophils Absolute: 0.5 10*3/uL (ref 0.0–0.5)
Eosinophils Relative: 8 %
HCT: 43 % (ref 39.0–52.0)
Hemoglobin: 13.7 g/dL (ref 13.0–17.0)
Immature Granulocytes: 0 %
Lymphocytes Relative: 18 %
Lymphs Abs: 1.1 10*3/uL (ref 0.7–4.0)
MCH: 31.7 pg (ref 26.0–34.0)
MCHC: 31.9 g/dL (ref 30.0–36.0)
MCV: 99.5 fL (ref 80.0–100.0)
Monocytes Absolute: 0.5 10*3/uL (ref 0.1–1.0)
Monocytes Relative: 9 %
Neutro Abs: 3.7 10*3/uL (ref 1.7–7.7)
Neutrophils Relative %: 64 %
Platelets: 179 10*3/uL (ref 150–400)
RBC: 4.32 MIL/uL (ref 4.22–5.81)
RDW: 15 % (ref 11.5–15.5)
WBC: 5.9 10*3/uL (ref 4.0–10.5)
nRBC: 0 % (ref 0.0–0.2)

## 2023-06-12 LAB — COMPREHENSIVE METABOLIC PANEL
ALT: 19 U/L (ref 0–44)
AST: 18 U/L (ref 15–41)
Albumin: 4.1 g/dL (ref 3.5–5.0)
Alkaline Phosphatase: 80 U/L (ref 38–126)
Anion gap: 4 — ABNORMAL LOW (ref 5–15)
BUN: 13 mg/dL (ref 6–20)
CO2: 34 mmol/L — ABNORMAL HIGH (ref 22–32)
Calcium: 9.4 mg/dL (ref 8.9–10.3)
Chloride: 103 mmol/L (ref 98–111)
Creatinine, Ser: 0.83 mg/dL (ref 0.61–1.24)
GFR, Estimated: >60 mL/min (ref >60)
Glucose, Bld: 87 mg/dL (ref 70–99)
Potassium: 4.2 mmol/L (ref 3.5–5.1)
Sodium: 141 mmol/L (ref 135–145)
Total Bilirubin: 0.8 mg/dL (ref 0.3–1.2)
Total Protein: 7 g/dL (ref 6.5–8.1)

## 2023-06-12 MED ORDER — SODIUM CHLORIDE 0.9 % IV SOLN
200.0000 mg | Freq: Once | INTRAVENOUS | Status: AC
  Administered 2023-06-12: 200 mg via INTRAVENOUS
  Filled 2023-06-12: qty 200

## 2023-06-12 MED ORDER — HEPARIN SOD (PORK) LOCK FLUSH 100 UNIT/ML IV SOLN
500.0000 [IU] | Freq: Once | INTRAVENOUS | Status: AC | PRN
  Administered 2023-06-12: 500 [IU]

## 2023-06-12 MED ORDER — PROCHLORPERAZINE MALEATE 10 MG PO TABS
10.0000 mg | ORAL_TABLET | Freq: Once | ORAL | Status: AC
  Administered 2023-06-12: 10 mg via ORAL
  Filled 2023-06-12: qty 1

## 2023-06-12 MED ORDER — SODIUM CHLORIDE 0.9% FLUSH
10.0000 mL | INTRAVENOUS | Status: DC | PRN
  Administered 2023-06-12: 10 mL

## 2023-06-12 MED ORDER — SODIUM CHLORIDE 0.9 % IV SOLN: Freq: Once | INTRAVENOUS | Status: AC

## 2023-06-12 MED ORDER — SODIUM CHLORIDE 0.9 % IV SOLN
500.0000 mg/m2 | Freq: Once | INTRAVENOUS | Status: AC
  Administered 2023-06-12: 1000 mg via INTRAVENOUS
  Filled 2023-06-12: qty 40

## 2023-06-12 NOTE — Progress Notes (Signed)
[facility] [phone]   [phone]  OFFICE PROGRESS NOTE  Tracey Harries, MD [address] [place] [address]  DIAGNOSIS:  1) Stage IV (T3, N2, M1 C) non-small cell lung cancer, adenocarcinoma presented with large right upper lobe lung mass in addition to right hilar and subcarinal lymphadenopathy in addition to multiple metastatic bone lesions involving the pelvis as well as the lower thoracic and lumbar spines in addition to metastatic disease to the pelvic muscles diagnosed in February 2024.  2) deep venous thrombosis of the right lower extremity diagnosed on December 18, 2022  Detected Alteration(s) / Biomarker(s) Associated FDA-approved therapies Clinical Trial Availability% cfDNA or Amplification  KRAS G12C approved by FDA Adagrasib, Sotorasib Yes 29.3%  CDK4 Amplification None Yes High (+++)Plasma Copy Number: 8.0  PD-L1 expression 98%  PRIOR THERAPY: Palliative radiotherapy to the painful bone metastasis under the care of Dr. Mitzi Hansen  CURRENT THERAPY:  1) Systemic chemotherapy with carboplatin for AUC of 5, Alimta 500 Mg/M2 and Keytruda 200 Mg IV every 3 weeks.  First dose November 14, 2022.  Status post 10 cycles.  Starting from cycle #5 the patient is on maintenance treatment with Alimta and Keytruda every 3 weeks. 2) Eliquis 10 mg p.o. twice daily for 7 days followed by 5 mg p.o. twice daily.  First dose 12/26/2022.  INTERVAL HISTORY: Dasani Speicher 57 y.o. male returns to the clinic today for follow-up visit. Discussed the use of AI scribe software for clinical note transcription with the patient, who gave verbal consent to proceed. History of Present Illness   The patient, a 57 year old individual with stage four lung cancer, is currently undergoing maintenance chemotherapy with Alimta and Keytruda. He reports feeling "about the same" since the last visit, with persistent shortness of breath and intermittent muscle  pain in the legs. The shortness of breath is not constant and is exacerbated by exertion. He uses oxygen at home during the night but not continuously throughout the day. Occasionally, he coughs up sputum, but denies hemoptysis.  There has been no recent weight loss; in fact, he reports weight gain. He denies any gastrointestinal symptoms such as nausea, vomiting, or diarrhea, and there have been no changes in vision or headaches.  The patient also reports chronic back pain, which seems to radiate to the legs, particularly upon movement. This pain is being managed by palliative care.  The patient's sister accompanied him to the consultation.       MEDICAL HISTORY: Past Medical History:  Diagnosis Date   CHF (congestive heart failure) (HCC)    COPD (chronic obstructive pulmonary disease) (HCC)    Diabetes mellitus without complication (HCC)    Dyspnea    Hypertension    Lung cancer (HCC) 10/2022   Right leg DVT (HCC) 12/21/2022    ALLERGIES:  is allergic to statins, aspirin, flexeril [cyclobenzaprine], and penicillins.  MEDICATIONS:  Current Outpatient Medications  Medication Sig Dispense Refill   Accu-Chek FastClix Lancets MISC Use to test blood sugar 4 times a day 102 each 0   albuterol (VENTOLIN HFA) 108 (90 Base) MCG/ACT inhaler Inhale 1-2 puffs into the lungs every 6 (six) hours as needed for wheezing or shortness of breath. 18 g 3   apixaban (ELIQUIS) 5 MG TABS tablet Take 1 tablet (5 mg total) by mouth 2 (two) times daily. 60 tablet 2   Fluticasone-Umeclidin-Vilant (TRELEGY ELLIPTA) 100-62.5-25 MCG/ACT AEPB Inhale 1 puff into the lungs daily in the afternoon.  60 each 5   folic acid (FOLVITE) 1 MG tablet Take 1 tablet (1 mg total) by mouth daily. 90 tablet 1   furosemide (LASIX) 20 MG tablet Take 1 tablet (20 mg) by mouth daily as needed. 7 tablet 0   glimepiride (AMARYL) 2 MG tablet Take 2 mg by mouth in the morning.     lidocaine-prilocaine (EMLA) cream Apply to [facility] Prior to  Access as needed (Patient not taking: Reported on 01/16/2023) 30 g 2   meloxicam (MOBIC) 7.5 MG tablet Take 1 tablet (7.5 mg total) by mouth daily. 30 tablet 2   ondansetron (ZOFRAN) 8 MG tablet Take 1 tablet (8 mg) by mouth every 8 hours as needed for nausea or vomiting. 30 tablet 2   OVER THE COUNTER MEDICATION Take 1 tablet by mouth daily as needed (constipation). " Stool softener     oxyCODONE (OXYCONTIN) 20 mg 12 hr tablet Take 1 tablet (20 mg total) by mouth every 12 (twelve) hours. 60 tablet 0   oxyCODONE-acetaminophen (PERCOCET) 7.5-325 MG tablet Take 1 tablet by mouth every 6 hours as needed for severe pain. 60 tablet 0   prochlorperazine (COMPAZINE) 10 MG tablet Take 1 tablet (10 mg total) by mouth every 6 (six) hours as needed for nausea or vomiting. 30 tablet 0   No current facility-administered medications for this visit.    SURGICAL HISTORY:  Past Surgical History:  Procedure Laterality Date   BRONCHIAL NEEDLE ASPIRATION BIOPSY  10/30/2022   Procedure: BRONCHIAL NEEDLE ASPIRATION BIOPSIES;  Surgeon: Leslye Peer, MD;  Location: [facility] ENDOSCOPY;  Service: Cardiopulmonary;;   IR IMAGING GUIDED PORT INSERTION  11/17/2022   NO PAST SURGERIES     RADIOLOGY WITH ANESTHESIA  10/30/2022   Procedure: MRI WITH ANESTHESIA W/WO CONTRAST;  Surgeon: Leslye Peer, MD;  Location[facility];  Service: Cardiopulmonary;;   VIDEO BRONCHOSCOPY WITH ENDOBRONCHIAL ULTRASOUND Right 10/30/2022   Procedure: VIDEO BRONCHOSCOPY WITH ENDOBRONCHIAL ULTRASOUND;  Surgeon: Leslye Peer, MD;  Location: [facility] ENDOSCOPY;  Service: Cardiopulmonary;  Laterality: Right;    REVIEW OF SYSTEMS:  A comprehensive review of systems was negative except for: Respiratory: positive for dyspnea on exertion Musculoskeletal: positive for arthralgias   PHYSICAL EXAMINATION: General appearance: alert, cooperative, and no distress Head: Normocephalic, without obvious abnormality, atraumatic Neck: no adenopathy, no JVD, supple,  symmetrical, trachea midline, and thyroid not enlarged, symmetric, no tenderness/mass/nodules Lymph nodes: Cervical, supraclavicular, and axillary nodes normal. Resp: clear to auscultation bilaterally Back: symmetric, no curvature. ROM normal. No CVA tenderness. Cardio: regular rate and rhythm, S1, S2 normal, no murmur, click, rub or gallop GI: soft, non-tender; bowel sounds normal; no masses,  no organomegaly Extremities: extremities normal, atraumatic, no cyanosis or edema  ECOG PERFORMANCE STATUS: 1 - Symptomatic but completely ambulatory  Blood pressure 131/86, pulse 66, temperature 98.2 F (36.8 C), temperature source Oral, resp. rate 17, height 5\' 6"  (1.676 m), weight 195 lb 4 oz (88.6 kg), SpO2 92%.  LABORATORY DATA: Lab Results  Component Value Date   WBC 5.9 06/12/2023   HGB 13.7 06/12/2023   HCT 43.0 06/12/2023   MCV 99.5 06/12/2023   PLT 179 06/12/2023      Chemistry      Component Value Date/Time   NA 140 05/22/2023 1404   K 4.4 05/22/2023 1404   CL 102 05/22/2023 1404   CO2 34 (H) 05/22/2023 1404   BUN 17 05/22/2023 1404   CREATININE 0.82 05/22/2023 1404      Component Value Date/Time  CALCIUM 9.4 05/22/2023 1404   ALKPHOS 81 05/22/2023 1404   AST 15 05/22/2023 1404   ALT 12 05/22/2023 1404   BILITOT 0.7 05/22/2023 1404       RADIOGRAPHIC STUDIES: CT CHEST ABDOMEN PELVIS W CONTRAST  Result Date: 05/18/2023 CLINICAL DATA:  Non-small cell lung cancer restaging * Tracking Code: BO * EXAM: CT CHEST, ABDOMEN, AND PELVIS WITH CONTRAST TECHNIQUE: Multidetector CT imaging of the chest, abdomen and pelvis was performed following the standard protocol during bolus administration of intravenous contrast. RADIATION DOSE REDUCTION: This exam was performed according to the departmental dose-optimization program which includes automated exposure control, adjustment of the mA and/or kV according to patient size and/or use of iterative reconstruction technique. CONTRAST:   OMNIPAQUE IOHEXOL 300 MG/ML  SOLN COMPARISON:  03/14/2023 FINDINGS: CT CHEST FINDINGS Cardiovascular: Right chest port catheter. Aortic atherosclerosis. Normal heart size. Left and right coronary artery calcifications. No pericardial effusion. Mediastinum/Nodes: No enlarged mediastinal, hilar, or axillary lymph nodes. Thyroid gland, trachea, and esophagus demonstrate no significant findings. Lungs/Pleura: Moderate centrilobular and paraseptal emphysema. Diffuse bilateral bronchial wall thickening. Unchanged spiculated mass of the central right upper lobe measuring 3.5 x 3.3 cm (series 4, image 51). No pleural effusion or pneumothorax. Musculoskeletal: No chest wall abnormality. No acute osseous findings. CT ABDOMEN PELVIS FINDINGS Hepatobiliary: No solid liver abnormality is seen. No gallstones, gallbladder wall thickening, or biliary dilatation. Pancreas: Unremarkable. No pancreatic ductal dilatation or surrounding inflammatory changes. Spleen: Normal in size without significant abnormality. Adrenals/Urinary Tract: Adrenal glands are unremarkable. Simple, benign bilateral renal cortical cysts, for which no further follow-up or characterization is required. Kidneys are otherwise normal, without renal calculi, solid lesion, or hydronephrosis. Bladder is unremarkable. Stomach/Bowel: Stomach is within normal limits. Appendix appears normal. No evidence of bowel wall thickening, distention, or inflammatory changes. Vascular/Lymphatic: Severe aortic atherosclerosis. No enlarged abdominal or pelvic lymph nodes. Reproductive: No mass or other abnormality. Other: No abdominal wall hernia or abnormality. No ascites. Musculoskeletal: No acute osseous findings. Unchanged mixed lytic and sclerotic osseous metastatic disease, including lesions of the manubrium, ribs, multiple vertebral bodies, and the bony pelvis, most notable for pathologic wedge and endplate deformities of T8, T9, T10, and T11, as well as a large,  sclerotic lesion of the left ilium with associated pathologic fracture deformity (series 2, image 101). IMPRESSION: 1. Unchanged spiculated mass of the central right upper lobe consistent with primary lung malignancy. 2. Unchanged mixed lytic and sclerotic osseous metastatic disease, including lesions of the manubrium, ribs, multiple vertebral bodies, and the bony pelvis, most notable for pathologic wedge and endplate deformities of T8, T9, T10, and T11, as well as a large, sclerotic lesion of the left ilium with associated pathologic fracture deformity. 3. No evidence of lymphadenopathy or soft tissue metastatic disease in the chest, abdomen, or pelvis. 4. Emphysema and diffuse bilateral bronchial wall thickening. 5. Coronary artery disease. Aortic Atherosclerosis (ICD10-I70.0) and Emphysema (ICD10-J43.9). Electronically Signed   By: Jearld Lesch M.D.   On: 05/18/2023 21:24    ASSESSMENT AND PLAN: This is a very pleasant 57 years old white male with stage IV (T3, N2, M1 C) non-small cell lung cancer, adenocarcinoma presented with large right upper lobe lung mass in addition to right hilar and subcarinal lymphadenopathy in addition to multiple metastatic bone lesions involving the pelvis as well as the lower thoracic and lumbar spines in addition to metastatic disease to the pelvic muscles diagnosed in February 2024.  Molecular studies showed positive KRAS G12C mutation and PD-L1 expression  of 98%. The patient is here today to start the first cycle of systemic chemotherapy with carboplatin for AUC of 5, Alimta 500 Mg/M2 and Keytruda 200 Mg IV every 3 weeks.  First dose November 14, 2022.  Status post 10 cycles.  Starting from cycle #5 the patient is on maintenance treatment with Alimta and Keytruda every 3 weeks. He has been tolerating this treatment fairly well with no concerning adverse effects. Assessment and Plan    Stage 4 Lung Cancer Stable on cycle 10 of Alimta and Keytruda. Reports shortness of  breath on exertion, but no chest pain. No coughing blood or recent weight loss. -Continue Alimta and Keytruda. -Return in 3 weeks for next cycle.  Musculoskeletal Pain Reports pain in back and legs. Pain management handled by palliative care. -Continue current pain management plan with palliative care.  General Health Maintenance Discussed timing and safety of flu and COVID vaccines in context of ongoing chemotherapy. -Administer flu vaccine when available, ensuring it does not coincide with COVID vaccine administration.   He was advised to call immediately if he has any other concerning symptoms in the interval. The patient voices understanding of current disease status and treatment options and is in agreement with the current care plan.  All questions were answered. The patient knows to call the clinic with any problems, questions or concerns. We can certainly see the patient much sooner if necessary.  The total time spent in the appointment was 20 minutes.  Disclaimer: This note was dictated with voice recognition software. Similar sounding words can inadvertently be transcribed and may not be corrected upon review.

## 2023-06-12 NOTE — Patient Instructions (Signed)
[facility]  Discharge Instructions: Thank you for choosing [facility] to provide your oncology and hematology care.   If you have a lab appointment with the [facility], please go directly to the [facility] and check in at the registration area.   Wear comfortable clothing and clothing appropriate for easy access to any Portacath or PICC line.   We strive to give you quality time with your provider. You may need to reschedule your appointment if you arrive late (15 or more minutes).  Arriving late affects you and other patients whose appointments are after yours.  Also, if you miss three or more appointments without notifying the office, you may be dismissed from the clinic at the provider's discretion.      For prescription refill requests, have your pharmacy contact our office and allow 72 hours for refills to be completed.    Today you received the following chemotherapy and/or immunotherapy agents: Keytruda, Alimta.       To help prevent nausea and vomiting after your treatment, we encourage you to take your nausea medication as directed.  BELOW ARE SYMPTOMS THAT SHOULD BE REPORTED IMMEDIATELY: *FEVER GREATER THAN 100.4 F (38 C) OR HIGHER *CHILLS OR SWEATING *NAUSEA AND VOMITING THAT IS NOT CONTROLLED WITH YOUR NAUSEA MEDICATION *UNUSUAL SHORTNESS OF BREATH *UNUSUAL BRUISING OR BLEEDING *URINARY PROBLEMS (pain or burning when urinating, or frequent urination) *BOWEL PROBLEMS (unusual diarrhea, constipation, pain near the anus) TENDERNESS IN MOUTH AND THROAT WITH OR WITHOUT PRESENCE OF ULCERS (sore throat, sores in mouth, or a toothache) UNUSUAL RASH, SWELLING OR PAIN  UNUSUAL VAGINAL DISCHARGE OR ITCHING   Items with * indicate a potential emergency and should be followed up as soon as possible or go to the Emergency Department if any problems should occur.  Please show the CHEMOTHERAPY ALERT CARD or IMMUNOTHERAPY ALERT CARD  at check-in to the Emergency Department and triage nurse.  Should you have questions after your visit or need to cancel or reschedule your appointment, please contact [facility]  Dept: [phone]  and follow the prompts.  Office hours are 8:00 a.m. to 4:30 p.m. Monday - Friday. Please note that voicemails left after 4:00 p.m. may not be returned until the following business day.  We are closed weekends and major holidays. You have access to a nurse at all times for urgent questions. Please call the main number to the clinic Dept: [phone] and follow the prompts.   For any non-urgent questions, you may also contact your provider using MyChart. We now offer e-Visits for anyone 34 and older to request care online for non-urgent symptoms. For details visit [email or web address].   Also download the MyChart app! Go to the app store, search "MyChart", open the app, select [facility], and log in with your MyChart username and password.

## 2023-06-18 ENCOUNTER — Encounter: Payer: Self-pay | Admitting: Internal Medicine

## 2023-06-18 ENCOUNTER — Other Ambulatory Visit (HOSPITAL_COMMUNITY): Payer: Self-pay

## 2023-06-18 ENCOUNTER — Other Ambulatory Visit: Payer: Self-pay | Admitting: Nurse Practitioner

## 2023-06-18 ENCOUNTER — Encounter (HOSPITAL_BASED_OUTPATIENT_CLINIC_OR_DEPARTMENT_OTHER): Payer: Self-pay | Admitting: Pulmonary Disease

## 2023-06-18 DIAGNOSIS — G893 Neoplasm related pain (acute) (chronic): Secondary | ICD-10-CM

## 2023-06-18 DIAGNOSIS — C3490 Malignant neoplasm of unspecified part of unspecified bronchus or lung: Secondary | ICD-10-CM

## 2023-06-18 DIAGNOSIS — Z515 Encounter for palliative care: Secondary | ICD-10-CM

## 2023-06-18 MED ORDER — OXYCODONE HCL ER 20 MG PO T12A
20.0000 mg | EXTENDED_RELEASE_TABLET | Freq: Two times a day (BID) | ORAL | 0 refills | Status: DC
Start: 2023-06-20 — End: 2023-07-21
  Filled 2023-06-20 – 2023-06-21 (×2): qty 60, 30d supply, fill #0

## 2023-06-18 MED ORDER — OXYCODONE-ACETAMINOPHEN 7.5-325 MG PO TABS
1.0000 | ORAL_TABLET | Freq: Four times a day (QID) | ORAL | 0 refills | Status: DC | PRN
Start: 2023-06-18 — End: 2023-07-17
  Filled 2023-06-18: qty 60, 15d supply, fill #0

## 2023-06-20 ENCOUNTER — Encounter: Payer: Self-pay | Admitting: Internal Medicine

## 2023-06-20 ENCOUNTER — Other Ambulatory Visit (HOSPITAL_COMMUNITY): Payer: Self-pay

## 2023-06-21 ENCOUNTER — Telehealth: Payer: Self-pay

## 2023-06-21 ENCOUNTER — Other Ambulatory Visit (HOSPITAL_COMMUNITY): Payer: Self-pay

## 2023-06-21 ENCOUNTER — Encounter: Payer: Self-pay | Admitting: Internal Medicine

## 2023-06-21 NOTE — Telephone Encounter (Signed)
Notified Patient of prior authorization approval for Oxycontin 20mg  Tablets. Medication is approved through 09/21/2023. No other needs or concerns voiced at this time.

## 2023-06-28 NOTE — Progress Notes (Signed)
[facility] OFFICE PROGRESS NOTE  Tracey Harries, MD 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  2) deep venous thrombosis of the right lower extremity diagnosed on December 18, 2022   Detected Alteration(s) / Biomarker(s)         Associated FDA-approved therapies  Clinical Trial Availability% cfDNA or Amplification   KRAS G12C approved by FDA Adagrasib, Sotorasib Yes    29.3%   CDK4 Amplification None Yes            High (+++)Plasma Copy Number: 8.0   PD-L1 expression 98%  PRIOR THERAPY: Palliative radiotherapy to the painful bone metastasis under the care of Dr. Mitzi Hansen   CURRENT THERAPY:  1) Systemic chemotherapy with carboplatin for AUC of 5, Alimta 500 Mg/M2 and Keytruda 200 Mg IV every 3 weeks.  First dose November 14, 2022.  Status post 11 cycles.  Working from cycle #5 the patient has been on maintenance treatment with Alimta and Keytruda every 3 weeks. 2) Eliquis 10 mg p.o. twice daily for 7 days followed by 5 mg p.o. twice daily.  First dose 12/26/2022.  INTERVAL HISTORY: Maxwell Grant 57 y.o. male returns to the clinic today for a follow-up visit accompanied by his sister.  The patient was last seen by Dr. Arbutus Ped 3 weeks ago.  The patient is currently on maintenance Alimta and Keytruda.  He tolerates this fairly well except for fatigue following treatment and some arthralgias in multiple joints.  He is followed closely by palliative care and is scheduled to see them next later today. He is currently on OxyContin 20 mg every 12 hours, oxycodone 7.5 mg 3x per day, Mobic, MiraLAX,  and Colace.  He is currently on Eliquis for history of DVT in the past.   He reports his fatigue is stable. He denies any fever or unexplained weight loss. His appetite is about the same and he reports it is "good". He reports some stable dyspnea on exertion which is unchanged. Denies any chest pain or hemoptysis.  He has an intermittent chronic cough which he also states is the same. It does not bother him except maybe at night. He does not take anything for cough but would be open to having medication for cough if needed. He sometimes takes mucinex. He continues to smoke cigarettes. He averages 1 ppd. He denies any significant nausea with treatment. Denies any vomiting, diarrhea, or constipation.  Denies any headache or visual changes.  Denies any rashes or skin changes except for dry skin.  He is here today for evaluation before starting cycle number 12     MEDICAL HISTORY: Past Medical History:  Diagnosis Date   CHF (congestive heart failure) (HCC)    COPD (chronic obstructive pulmonary disease) (HCC)    Diabetes mellitus without complication (HCC)    Dyspnea    Hypertension    Lung cancer (HCC) 10/2022   Right leg DVT (HCC) 12/21/2022    ALLERGIES:  is allergic to statins, aspirin, flexeril [cyclobenzaprine], and penicillins.  MEDICATIONS:  Current Outpatient Medications  Medication Sig Dispense Refill   Accu-Chek FastClix Lancets MISC Use to test blood  sugar 4 times a day 102 each 0   albuterol (VENTOLIN HFA) 108 (90 Base) MCG/ACT inhaler Inhale 1-2 puffs into the lungs every 6 (six) hours as needed for wheezing or shortness of breath. 18 g 3   apixaban (ELIQUIS) 5 MG TABS tablet Take 1 tablet (5 mg total) by mouth 2 (two) times daily. 60 tablet 2   Fluticasone-Umeclidin-Vilant (TRELEGY ELLIPTA) 100-62.5-25 MCG/ACT AEPB Inhale 1 puff into the lungs daily in the afternoon. 60 each 5   folic acid (FOLVITE) 1 MG tablet Take 1 tablet (1 mg total) by mouth daily. 90 tablet 1   furosemide  (LASIX) 20 MG tablet Take 1 tablet (20 mg) by mouth daily as needed. 7 tablet 0   glimepiride (AMARYL) 2 MG tablet Take 2 mg by mouth in the morning.     lidocaine-prilocaine (EMLA) cream Apply to [facility] Prior to Access as needed (Patient not taking: Reported on 01/16/2023) 30 g 2   meloxicam (MOBIC) 7.5 MG tablet Take 1 tablet (7.5 mg total) by mouth daily. 30 tablet 2   ondansetron (ZOFRAN) 8 MG tablet Take 1 tablet (8 mg) by mouth every 8 hours as needed for nausea or vomiting. 30 tablet 2   OVER THE COUNTER MEDICATION Take 1 tablet by mouth daily as needed (constipation). " Stool softener     oxyCODONE (OXYCONTIN) 20 mg 12 hr tablet Take 1 tablet (20 mg total) by mouth every 12 (twelve) hours. 60 tablet 0   oxyCODONE-acetaminophen (PERCOCET) 7.5-325 MG tablet Take 1 tablet by mouth every 6 hours as needed for severe pain. 60 tablet 0   prochlorperazine (COMPAZINE) 10 MG tablet Take 1 tablet (10 mg total) by mouth every 6 (six) hours as needed for nausea or vomiting. 30 tablet 0   No current facility-administered medications for this visit.    SURGICAL HISTORY:  Past Surgical History:  Procedure Laterality Date   BRONCHIAL NEEDLE ASPIRATION BIOPSY  10/30/2022   Procedure: BRONCHIAL NEEDLE ASPIRATION BIOPSIES;  Surgeon: Leslye Peer, MD;  Location: [facility] ENDOSCOPY;  Service: Cardiopulmonary;;   IR IMAGING GUIDED PORT INSERTION  11/17/2022   NO PAST SURGERIES     RADIOLOGY WITH ANESTHESIA  10/30/2022   Procedure: MRI WITH ANESTHESIA W/WO CONTRAST;  Surgeon: Leslye Peer, MD;  Location[facility] ENDOSCOPY;  Service: Cardiopulmonary;;   VIDEO BRONCHOSCOPY WITH ENDOBRONCHIAL ULTRASOUND Right 10/30/2022   Procedure: VIDEO BRONCHOSCOPY WITH ENDOBRONCHIAL ULTRASOUND;  Surgeon: Leslye Peer, MD;  Location: [facility] ENDOSCOPY;  Service: Cardiopulmonary;  Laterality: Right;    Constitutional: Positive for fatigue.  Negative for appetite change, chills, fever and unexpected weight change.  HENT: Negative for  mouth sores, nosebleeds, sore throat and trouble swallowing.   Eyes: Negative for eye problems and icterus.  Respiratory: Positive for baseline dyspnea on exertion and stable intermittent cough.  Negative for hemoptysis and wheezing.     Cardiovascular: Negative for chest pain and leg swelling.  Gastrointestinal: Negative for abdominal pain, constipation, diarrhea, nausea and vomiting.  Genitourinary: Negative for bladder incontinence, difficulty urinating, dysuria, frequency and hematuria.   Musculoskeletal: Positive for cancer related bone pain. Negative for gait problem, neck pain and neck stiffness.  Skin: Negative for itching and rash.  Neurological: Negative for dizziness, extremity weakness, gait problem, headaches, light-headedness and seizures.  Hematological: Negative for adenopathy. Does not bruise/bleed easily.  Psychiatric/Behavioral: Negative for confusion, depression and sleep disturbance. The patient is not nervous/anxious.     PHYSICAL EXAMINATION:  There were no vitals taken for this visit.  ECOG PERFORMANCE STATUS: 1-2  Physical Exam  Constitutional: Oriented to person, place, and time and well-developed, well-nourished, and in no distress.  HENT:  Head: Normocephalic and atraumatic.  Mouth/Throat: Oropharynx is clear and moist. No oropharyngeal exudate.  Eyes: Conjunctivae are normal. Right eye exhibits no discharge. Left eye exhibits no discharge. No scleral icterus.  Neck: Normal range of motion. Neck supple.  Cardiovascular: Normal rate, regular rhythm, normal heart sounds and intact distal pulses.   Pulmonary/Chest: Effort normal. Quiet breath sounds bilaterally. No respiratory distress. No wheezes. No rales.  Abdominal: Soft. Bowel sounds are normal. Exhibits no distension and no mass. There is no tenderness.  Musculoskeletal: Normal range of motion. Exhibits no significant lower extremity edema, just dry skin.  Lymphadenopathy:    No cervical adenopathy.   Neurological: Alert and oriented to person, place, and time. Exhibits muscle wasting. Examined in the wheelchair.  Skin: Skin is warm and dry. No rash noted. Not diaphoretic. No erythema. No pallor.  Psychiatric: Mood, memory and judgment normal.  Vitals reviewed.  LABORATORY DATA: Lab Results  Component Value Date   WBC 5.9 06/12/2023   HGB 13.7 06/12/2023   HCT 43.0 06/12/2023   MCV 99.5 06/12/2023   PLT 179 06/12/2023      Chemistry      Component Value Date/Time   NA 141 06/12/2023 0752   K 4.2 06/12/2023 0752   CL 103 06/12/2023 0752   CO2 34 (H) 06/12/2023 0752   BUN 13 06/12/2023 0752   CREATININE 0.83 06/12/2023 0752   CREATININE 0.82 05/22/2023 1404      Component Value Date/Time   CALCIUM 9.4 06/12/2023 0752   ALKPHOS 80 06/12/2023 0752   AST 18 06/12/2023 0752   AST 15 05/22/2023 1404   ALT 19 06/12/2023 0752   ALT 12 05/22/2023 1404   BILITOT 0.8 06/12/2023 0752   BILITOT 0.7 05/22/2023 1404       RADIOGRAPHIC STUDIES:  No results found.   ASSESSMENT/PLAN:  This is a very pleasant 57 years old Caucasian male with stage IV (T3, N2, M1 C) non-small cell lung cancer, adenocarcinoma presented with large right upper lobe lung mass in addition to right hilar and subcarinal lymphadenopathy in addition to multiple metastatic bone lesions involving the pelvis as well as the lower thoracic and lumbar spines in addition to metastatic disease to the pelvic muscles diagnosed in February 2024.  Molecular studies showed positive KRAS G12C mutation and PD-L1 expression of 98%.   The patient is here today to start the first cycle of systemic chemotherapy with carboplatin for AUC of 5, Alimta 500 Mg/M2 and Keytruda 200 Mg IV every 3 weeks.  First dose November 14, 2022.  Status post 11 cycles.  Starting from cycle #5 the patient is on maintenance treatment with Alimta and Keytruda every 3 weeks.   Labs were reviewed. Recommend that he proceed with cycle #12 today as  scheduled.    We will see him back for follow-up visit in 3 weeks for evaluation repeat blood work before undergoing cycle #13.  I will arrange for a restaging CT scan of the CAP prior to starting his next cycle of treatment.   He will continue follow-up with palliative care for pain management. He is scheduled to see them today. I let him know that sometimes immunotherapy can flare up joint pain as well. He may consider topical Voltaren cream occasionally if needed but not to exceed the package insert instructions.   I sent him a  prescription for tessalon if needed for cough at night.    He will continue on his blood thinner for his history of DVT.    The patient was advised to call immediately if he has any concerning symptoms in the interval. The patient voices understanding of current disease status and treatment options and is in agreement with the current care plan. All questions were answered. The patient knows to call the clinic with any problems, questions or concerns. We can certainly see the patient much sooner if necessary  No orders of the defined types were placed in this encounter.   The total time spent in the appointment was 20-29 minutes  Cambria Osten L Bret Vanessen, PA-C 06/28/23

## 2023-06-29 ENCOUNTER — Other Ambulatory Visit: Payer: Self-pay

## 2023-07-02 NOTE — Progress Notes (Unsigned)
Palliative Medicine [facility]  [phone] [phone]   Name: Maxwell Grant Date: 07/02/2023 MRN: [id number]  DOB: Dec 25, 1965  Patient Care Team: Tracey Harries, MD as PCP - General (Family Medicine)    INTERVAL HISTORY: Maxwell Grant is a 57 y.o. male with  oncologic medical history including new diagnosed non-small cell lung cancer (10/2022), as well as a history of COPD, diabetes mellitus, hypertension, dyslipidemia as well as history of osteomyelitis of the back.  Palliative ask to see for symptom and pain management and goals of care.     SOCIAL HISTORY:    Maxwell Grant reports that he has been smoking cigarettes. He has never used smokeless tobacco. He reports current alcohol use of about 1.0 standard drink of alcohol per week. He reports current drug use. Drug: Marijuana.  ADVANCE DIRECTIVES: none on file   CODE STATUS: Full Code  PAST MEDICAL HISTORY: Past Medical History:  Diagnosis Date   CHF (congestive heart failure) (HCC)    COPD (chronic obstructive pulmonary disease) (HCC)    Diabetes mellitus without complication (HCC)    Dyspnea    Hypertension    Lung cancer (HCC) 10/2022   Right leg DVT (HCC) 12/21/2022    ALLERGIES:  is allergic to statins, aspirin, flexeril [cyclobenzaprine], and penicillins.  MEDICATIONS:  Current Outpatient Medications  Medication Sig Dispense Refill   Accu-Chek FastClix Lancets MISC Use to test blood sugar 4 times a day 102 each 0   albuterol (VENTOLIN HFA) 108 (90 Base) MCG/ACT inhaler Inhale 1-2 puffs into the lungs every 6 (six) hours as needed for wheezing or shortness of breath. 18 g 3   apixaban (ELIQUIS) 5 MG TABS tablet Take 1 tablet (5 mg total) by mouth 2 (two) times daily. 60 tablet 2   Fluticasone-Umeclidin-Vilant (TRELEGY ELLIPTA) 100-62.5-25 MCG/ACT AEPB Inhale 1 puff into the lungs daily in the afternoon. 60 each 5   folic acid (FOLVITE) 1 MG tablet Take 1 tablet (1 mg  total) by mouth daily. 90 tablet 1   furosemide (LASIX) 20 MG tablet Take 1 tablet (20 mg) by mouth daily as needed. 7 tablet 0   glimepiride (AMARYL) 2 MG tablet Take 2 mg by mouth in the morning.     lidocaine-prilocaine (EMLA) cream Apply to [facility] Prior to Access as needed (Patient not taking: Reported on 01/16/2023) 30 g 2   meloxicam (MOBIC) 7.5 MG tablet Take 1 tablet (7.5 mg total) by mouth daily. 30 tablet 2   ondansetron (ZOFRAN) 8 MG tablet Take 1 tablet (8 mg) by mouth every 8 hours as needed for nausea or vomiting. 30 tablet 2   OVER THE COUNTER MEDICATION Take 1 tablet by mouth daily as needed (constipation). " Stool softener     oxyCODONE (OXYCONTIN) 20 mg 12 hr tablet Take 1 tablet (20 mg total) by mouth every 12 (twelve) hours. 60 tablet 0   oxyCODONE-acetaminophen (PERCOCET) 7.5-325 MG tablet Take 1 tablet by mouth every 6 hours as needed for severe pain. 60 tablet 0   prochlorperazine (COMPAZINE) 10 MG tablet Take 1 tablet (10 mg total) by mouth every 6 (six) hours as needed for nausea or vomiting. 30 tablet 0   No current facility-administered medications for this visit.    VITAL SIGNS: There were no vitals taken for this visit. There were no vitals filed for this visit.  Estimated body mass index is 31.51 kg/m as calculated from the following:   Height as of 06/12/23: 5\' 6"  (  1.676 m).   Weight as of 06/12/23: 195 lb 4 oz (88.6 kg).   PERFORMANCE STATUS (ECOG) : 2 - Symptomatic, <50% confined to bed   Physical Exam General: NAD Cardiovascular: regular rate and rhythm Skin: no rashes Neurological: oriented x 4   IMPRESSION: I saw Maxwell Grant during infusion. No acute distress. Tolerating infusion without difficulty. Is remaining as active as possible. Denies nausea, vomiting, constipation, or diarrhea. Appetite is fair. Some days are better than others.   Neoplasm related pain Maxwell Grant reports pain overall is well controlled. Some days are better than others.  He is complaining of increase arthritic pain in his joints. Taking his mobic as prescribed. We discussed increasing twice daily.   We discussed current regimen. He is taking OxyContin 20mg  very 12 hours and Percocet as needed for breakthrough pain. Not requiring around the clock. Is taking medications as prescribed. This is evident by refill request.   We will continue to closely monitor and adjust medications as needed.     We discussed the importance of continued conversation with family and their medical providers regarding overall plan of care and treatment options, ensuring decisions are within the context of the patients values and GOCs.  PLAN: OxyContin 20 mg every 12 hours.  Oxycodone 7.5/325 -15mg  every 6 hours as needed for breakthrough pain. Not requiring around the clock.  Mobic 7.5 mg twice daily. MiraLAX twice daily. Colace daily. Ongoing symptom management support and goals of care discussions. Palliative will plan to see patient back in 3-4 weeks in collaboration to other oncology appointments.  Knows to contact office sooner if needed.   Patient expressed understanding and was in agreement with this plan. He also understands that He can call the clinic at any time with any questions, concerns, or complaints.    Any controlled substances utilized were prescribed in the context of palliative care. PDMP has been reviewed.   Visit consisted of counseling and education dealing with the complex and emotionally intense issues of symptom management and palliative care in the setting of serious and potentially life-threatening illness.Greater than 50%  of this time was spent counseling and coordinating care related to the above assessment and plan.  [name], AGPCNP-BC  Palliative Medicine Team/[facility]  *Please note that this is a verbal dictation therefore any spelling or grammatical errors are due to the "[employer] One" system  interpretation.

## 2023-07-03 ENCOUNTER — Encounter: Payer: Self-pay | Admitting: Internal Medicine

## 2023-07-03 ENCOUNTER — Inpatient Hospital Stay: Payer: Medicaid Other | Attending: Internal Medicine | Admitting: Physician Assistant

## 2023-07-03 ENCOUNTER — Inpatient Hospital Stay (HOSPITAL_BASED_OUTPATIENT_CLINIC_OR_DEPARTMENT_OTHER): Payer: Medicaid Other | Admitting: Nurse Practitioner

## 2023-07-03 ENCOUNTER — Inpatient Hospital Stay: Payer: Medicaid Other | Attending: Internal Medicine

## 2023-07-03 ENCOUNTER — Encounter: Payer: Self-pay | Admitting: Nurse Practitioner

## 2023-07-03 ENCOUNTER — Other Ambulatory Visit (HOSPITAL_COMMUNITY): Payer: Self-pay

## 2023-07-03 ENCOUNTER — Inpatient Hospital Stay: Payer: Medicaid Other

## 2023-07-03 VITALS — BP 124/66 | HR 71 | Temp 98.0°F | Resp 17 | Ht 66.0 in | Wt 199.4 lb

## 2023-07-03 DIAGNOSIS — Z79631 Long term (current) use of antimetabolite agent: Secondary | ICD-10-CM | POA: Insufficient documentation

## 2023-07-03 DIAGNOSIS — Z7901 Long term (current) use of anticoagulants: Secondary | ICD-10-CM | POA: Diagnosis not present

## 2023-07-03 DIAGNOSIS — Z5112 Encounter for antineoplastic immunotherapy: Secondary | ICD-10-CM

## 2023-07-03 DIAGNOSIS — G893 Neoplasm related pain (acute) (chronic): Secondary | ICD-10-CM | POA: Diagnosis not present

## 2023-07-03 DIAGNOSIS — C7951 Secondary malignant neoplasm of bone: Secondary | ICD-10-CM | POA: Diagnosis not present

## 2023-07-03 DIAGNOSIS — Z79620 Long term (current) use of immunosuppressive biologic: Secondary | ICD-10-CM | POA: Diagnosis not present

## 2023-07-03 DIAGNOSIS — Z86718 Personal history of other venous thrombosis and embolism: Secondary | ICD-10-CM | POA: Insufficient documentation

## 2023-07-03 DIAGNOSIS — Z5111 Encounter for antineoplastic chemotherapy: Secondary | ICD-10-CM | POA: Diagnosis present

## 2023-07-03 DIAGNOSIS — C3490 Malignant neoplasm of unspecified part of unspecified bronchus or lung: Secondary | ICD-10-CM

## 2023-07-03 DIAGNOSIS — Z95828 Presence of other vascular implants and grafts: Secondary | ICD-10-CM

## 2023-07-03 DIAGNOSIS — Z886 Allergy status to analgesic agent status: Secondary | ICD-10-CM | POA: Insufficient documentation

## 2023-07-03 DIAGNOSIS — C3411 Malignant neoplasm of upper lobe, right bronchus or lung: Secondary | ICD-10-CM | POA: Diagnosis present

## 2023-07-03 DIAGNOSIS — Z515 Encounter for palliative care: Secondary | ICD-10-CM

## 2023-07-03 DIAGNOSIS — Z79899 Other long term (current) drug therapy: Secondary | ICD-10-CM | POA: Diagnosis not present

## 2023-07-03 DIAGNOSIS — F17210 Nicotine dependence, cigarettes, uncomplicated: Secondary | ICD-10-CM | POA: Diagnosis not present

## 2023-07-03 DIAGNOSIS — M2550 Pain in unspecified joint: Secondary | ICD-10-CM | POA: Diagnosis not present

## 2023-07-03 DIAGNOSIS — Z880 Allergy status to penicillin: Secondary | ICD-10-CM | POA: Diagnosis not present

## 2023-07-03 DIAGNOSIS — Z888 Allergy status to other drugs, medicaments and biological substances status: Secondary | ICD-10-CM | POA: Diagnosis not present

## 2023-07-03 LAB — CBC WITH DIFFERENTIAL (CANCER CENTER ONLY)
Abs Immature Granulocytes: 0.02 10*3/uL (ref 0.00–0.07)
Basophils Absolute: 0.1 10*3/uL (ref 0.0–0.1)
Basophils Relative: 1 %
Eosinophils Absolute: 0.4 10*3/uL (ref 0.0–0.5)
Eosinophils Relative: 8 %
HCT: 42.4 % (ref 39.0–52.0)
Hemoglobin: 13.8 g/dL (ref 13.0–17.0)
Immature Granulocytes: 0 %
Lymphocytes Relative: 15 %
Lymphs Abs: 0.8 10*3/uL (ref 0.7–4.0)
MCH: 32.1 pg (ref 26.0–34.0)
MCHC: 32.5 g/dL (ref 30.0–36.0)
MCV: 98.6 fL (ref 80.0–100.0)
Monocytes Absolute: 0.4 10*3/uL (ref 0.1–1.0)
Monocytes Relative: 8 %
Neutro Abs: 3.7 10*3/uL (ref 1.7–7.7)
Neutrophils Relative %: 68 %
Platelet Count: 194 10*3/uL (ref 150–400)
RBC: 4.3 MIL/uL (ref 4.22–5.81)
RDW: 15.7 % — ABNORMAL HIGH (ref 11.5–15.5)
WBC Count: 5.4 10*3/uL (ref 4.0–10.5)
nRBC: 0 % (ref 0.0–0.2)

## 2023-07-03 LAB — CMP (CANCER CENTER ONLY)
ALT: 12 U/L (ref 0–44)
AST: 14 U/L — ABNORMAL LOW (ref 15–41)
Albumin: 4.1 g/dL (ref 3.5–5.0)
Alkaline Phosphatase: 76 U/L (ref 38–126)
Anion gap: 4 — ABNORMAL LOW (ref 5–15)
BUN: 18 mg/dL (ref 6–20)
CO2: 34 mmol/L — ABNORMAL HIGH (ref 22–32)
Calcium: 9.7 mg/dL (ref 8.9–10.3)
Chloride: 103 mmol/L (ref 98–111)
Creatinine: 1 mg/dL (ref 0.61–1.24)
GFR, Estimated: >60 mL/min (ref >60)
Glucose, Bld: 93 mg/dL (ref 70–99)
Potassium: 4.1 mmol/L (ref 3.5–5.1)
Sodium: 141 mmol/L (ref 135–145)
Total Bilirubin: 0.7 mg/dL (ref 0.3–1.2)
Total Protein: 7 g/dL (ref 6.5–8.1)

## 2023-07-03 LAB — TSH: TSH: 2.277 u[IU]/mL (ref 0.350–4.500)

## 2023-07-03 MED ORDER — CYANOCOBALAMIN 1000 MCG/ML IJ SOLN
1000.0000 ug | Freq: Once | INTRAMUSCULAR | Status: AC
  Administered 2023-07-03: 1000 ug via INTRAMUSCULAR
  Filled 2023-07-03: qty 1

## 2023-07-03 MED ORDER — SODIUM CHLORIDE 0.9% FLUSH
10.0000 mL | Freq: Once | INTRAVENOUS | Status: AC
  Administered 2023-07-03: 10 mL

## 2023-07-03 MED ORDER — SODIUM CHLORIDE 0.9 % IV SOLN
500.0000 mg/m2 | Freq: Once | INTRAVENOUS | Status: AC
  Administered 2023-07-03: 1000 mg via INTRAVENOUS
  Filled 2023-07-03: qty 40

## 2023-07-03 MED ORDER — SODIUM CHLORIDE 0.9 % IV SOLN
200.0000 mg | Freq: Once | INTRAVENOUS | Status: AC
  Administered 2023-07-03: 200 mg via INTRAVENOUS
  Filled 2023-07-03: qty 200

## 2023-07-03 MED ORDER — BENZONATATE 100 MG PO CAPS
100.0000 mg | ORAL_CAPSULE | Freq: Three times a day (TID) | ORAL | 2 refills | Status: AC | PRN
Start: 2023-07-03 — End: ?
  Filled 2023-07-03: qty 30, 10d supply, fill #0

## 2023-07-03 MED ORDER — PROCHLORPERAZINE MALEATE 10 MG PO TABS
10.0000 mg | ORAL_TABLET | Freq: Once | ORAL | Status: AC
  Administered 2023-07-03: 10 mg via ORAL
  Filled 2023-07-03: qty 1

## 2023-07-03 MED ORDER — HEPARIN SOD (PORK) LOCK FLUSH 100 UNIT/ML IV SOLN
500.0000 [IU] | Freq: Once | INTRAVENOUS | Status: AC | PRN
  Administered 2023-07-03: 500 [IU]

## 2023-07-03 MED ORDER — SODIUM CHLORIDE 0.9 % IV SOLN: Freq: Once | INTRAVENOUS | Status: AC

## 2023-07-03 MED ORDER — ZOLEDRONIC ACID 4 MG/100ML IV SOLN
4.0000 mg | Freq: Once | INTRAVENOUS | Status: AC
  Administered 2023-07-03: 4 mg via INTRAVENOUS
  Filled 2023-07-03: qty 100

## 2023-07-03 MED ORDER — SODIUM CHLORIDE 0.9% FLUSH
10.0000 mL | INTRAVENOUS | Status: DC | PRN
  Administered 2023-07-03: 10 mL

## 2023-07-03 NOTE — Patient Instructions (Signed)
[facility]  Discharge Instructions: Thank you for choosing [facility] to provide your oncology and hematology care.   If you have a lab appointment with the Cancer Center, please go directly to the Cancer Center and check in at the registration area.   Wear comfortable clothing and clothing appropriate for easy access to any Portacath or PICC line.   We strive to give you quality time with your provider. You may need to reschedule your appointment if you arrive late (15 or more minutes).  Arriving late affects you and other patients whose appointments are after yours.  Also, if you miss three or more appointments without notifying the office, you may be dismissed from the clinic at the provider's discretion.      For prescription refill requests, have your pharmacy contact our office and allow 72 hours for refills to be completed.    Today you received the following chemotherapy and/or immunotherapy agents: Alimta, Keytruda      To help prevent nausea and vomiting after your treatment, we encourage you to take your nausea medication as directed.  BELOW ARE SYMPTOMS THAT SHOULD BE REPORTED IMMEDIATELY: *FEVER GREATER THAN 100.4 F (38 C) OR HIGHER *CHILLS OR SWEATING *NAUSEA AND VOMITING THAT IS NOT CONTROLLED WITH YOUR NAUSEA MEDICATION *UNUSUAL SHORTNESS OF BREATH *UNUSUAL BRUISING OR BLEEDING *URINARY PROBLEMS (pain or burning when urinating, or frequent urination) *BOWEL PROBLEMS (unusual diarrhea, constipation, pain near the anus) TENDERNESS IN MOUTH AND THROAT WITH OR WITHOUT PRESENCE OF ULCERS (sore throat, sores in mouth, or a toothache) UNUSUAL RASH, SWELLING OR PAIN  UNUSUAL VAGINAL DISCHARGE OR ITCHING   Items with * indicate a potential emergency and should be followed up as soon as possible or go to the Emergency Department if any problems should occur.  Please show the CHEMOTHERAPY ALERT CARD or IMMUNOTHERAPY ALERT CARD  at check-in to the Emergency Department and triage nurse.  Should you have questions after your visit or need to cancel or reschedule your appointment, please contact [facility]  Dept: [phone]  and follow the prompts.  Office hours are 8:00 a.m. to 4:30 p.m. Monday - Friday. Please note that voicemails left after 4:00 p.m. may not be returned until the following business day.  We are closed weekends and major holidays. You have access to a nurse at all times for urgent questions. Please call the main number to the clinic Dept: [phone] and follow the prompts.   For any non-urgent questions, you may also contact your provider using MyChart. We now offer e-Visits for anyone 45 and older to request care online for non-urgent symptoms. For details visit [email or web address].   Also download the MyChart app! Go to the app store, search "MyChart", open the app, select [facility], and log in with your MyChart username and password.  Zoledronic Acid Injection (Cancer) What is this medication? ZOLEDRONIC ACID (ZOE le dron ik AS id) treats high calcium levels in the blood caused by cancer. It may also be used with chemotherapy to treat weakened bones caused by cancer. It works by slowing down the release of calcium from bones. This lowers calcium levels in your blood. It also makes your bones stronger and less likely to break (fracture). It belongs to a group of medications called bisphosphonates. This medicine may be used for other purposes; ask your health care provider or pharmacist if you have questions. COMMON BRAND NAME(S): Zometa, Zometa Powder What  should I tell my care team before I take this medication? They need to know if you have any of these conditions: Dehydration Dental disease Kidney disease Liver disease Low levels of calcium in the blood Lung or breathing disease, such as asthma Receiving steroids, such as dexamethasone or  prednisone An unusual or allergic reaction to zoledronic acid, other medications, foods, dyes, or preservatives Pregnant or trying to get pregnant Breast-feeding How should I use this medication? This medication is injected into a vein. It is given by your care team in a hospital or clinic setting. Talk to your care team about the use of this medication in children. Special care may be needed. Overdosage: If you think you have taken too much of this medicine contact a poison control center or emergency room at once. NOTE: This medicine is only for you. Do not share this medicine with others. What if I miss a dose? Keep appointments for follow-up doses. It is important not to miss your dose. Call your care team if you are unable to keep an appointment. What may interact with this medication? Certain antibiotics given by injection Diuretics, such as bumetanide, furosemide NSAIDs, medications for pain and inflammation, such as ibuprofen or naproxen Teriparatide Thalidomide This list may not describe all possible interactions. Give your health care provider a list of all the medicines, herbs, non-prescription drugs, or dietary supplements you use. Also tell them if you smoke, drink alcohol, or use illegal drugs. Some items may interact with your medicine. What should I watch for while using this medication? Visit your care team for regular checks on your progress. It may be some time before you see the benefit from this medication. Some people who take this medication have severe bone, joint, or muscle pain. This medication may also increase your risk for jaw problems or a broken thigh bone. Tell your care team right away if you have severe pain in your jaw, bones, joints, or muscles. Tell you care team if you have any pain that does not go away or that gets worse. Tell your dentist and dental surgeon that you are taking this medication. You should not have major dental surgery while on this  medication. See your dentist to have a dental exam and fix any dental problems before starting this medication. Take good care of your teeth while on this medication. Make sure you see your dentist for regular follow-up appointments. You should make sure you get enough calcium and vitamin D while you are taking this medication. Discuss the foods you eat and the vitamins you take with your care team. Check with your care team if you have severe diarrhea, nausea, and vomiting, or if you sweat a lot. The loss of too much body fluid may make it dangerous for you to take this medication. You may need bloodwork while taking this medication. Talk to your care team if you wish to become pregnant or think you might be pregnant. This medication can cause serious birth defects. What side effects may I notice from receiving this medication? Side effects that you should report to your care team as soon as possible: Allergic reactions--skin rash, itching, hives, swelling of the face, lips, tongue, or throat Kidney injury--decrease in the amount of urine, swelling of the ankles, hands, or feet Low calcium level--muscle pain or cramps, confusion, tingling, or numbness in the hands or feet Osteonecrosis of the jaw--pain, swelling, or redness in the mouth, numbness of the jaw, poor healing after dental work, unusual  discharge from the mouth, visible bones in the mouth Severe bone, joint, or muscle pain Side effects that usually do not require medical attention (report to your care team if they continue or are bothersome): Constipation Fatigue Fever Loss of appetite Nausea Stomach pain This list may not describe all possible side effects. Call your doctor for medical advice about side effects. You may report side effects to FDA at 1-800-FDA-1088. Where should I keep my medication? This medication is given in a hospital or clinic. It will not be stored at home. NOTE: This sheet is a summary. It may not cover all  possible information. If you have questions about this medicine, talk to your doctor, pharmacist, or health care provider.  2024 [employer]/Gold Standard (2021-10-28 00:00:00)

## 2023-07-04 LAB — T4: T4, Total: 9.4 ug/dL (ref 4.5–12.0)

## 2023-07-06 ENCOUNTER — Other Ambulatory Visit: Payer: Self-pay | Admitting: Nurse Practitioner

## 2023-07-06 ENCOUNTER — Other Ambulatory Visit (HOSPITAL_COMMUNITY): Payer: Self-pay

## 2023-07-06 DIAGNOSIS — M25552 Pain in left hip: Secondary | ICD-10-CM

## 2023-07-06 DIAGNOSIS — Z515 Encounter for palliative care: Secondary | ICD-10-CM

## 2023-07-06 DIAGNOSIS — C3490 Malignant neoplasm of unspecified part of unspecified bronchus or lung: Secondary | ICD-10-CM

## 2023-07-06 MED ORDER — MELOXICAM 7.5 MG PO TABS
7.5000 mg | ORAL_TABLET | Freq: Two times a day (BID) | ORAL | 2 refills | Status: DC
  Filled 2023-07-06 – 2023-07-19 (×2): qty 60, 30d supply, fill #0
  Filled 2023-08-18: qty 60, 30d supply, fill #1
  Filled 2023-09-15: qty 60, 30d supply, fill #2

## 2023-07-13 ENCOUNTER — Other Ambulatory Visit (HOSPITAL_COMMUNITY): Payer: Self-pay

## 2023-07-17 ENCOUNTER — Other Ambulatory Visit: Payer: Self-pay

## 2023-07-17 ENCOUNTER — Other Ambulatory Visit (HOSPITAL_BASED_OUTPATIENT_CLINIC_OR_DEPARTMENT_OTHER): Payer: Self-pay | Admitting: Pulmonary Disease

## 2023-07-17 ENCOUNTER — Encounter: Payer: Self-pay | Admitting: Internal Medicine

## 2023-07-17 ENCOUNTER — Other Ambulatory Visit (HOSPITAL_COMMUNITY): Payer: Self-pay

## 2023-07-17 ENCOUNTER — Ambulatory Visit (HOSPITAL_COMMUNITY)
Admission: RE | Admit: 2023-07-17 | Discharge: 2023-07-17 | Disposition: A | Discharge status: Home / Self Care | Payer: Medicaid Other | Source: Ambulatory Visit | Attending: Physician Assistant | Admitting: Physician Assistant

## 2023-07-17 DIAGNOSIS — C3490 Malignant neoplasm of unspecified part of unspecified bronchus or lung: Secondary | ICD-10-CM

## 2023-07-17 DIAGNOSIS — Z515 Encounter for palliative care: Secondary | ICD-10-CM

## 2023-07-17 DIAGNOSIS — G893 Neoplasm related pain (acute) (chronic): Secondary | ICD-10-CM

## 2023-07-17 MED ORDER — IOHEXOL 300 MG/ML  SOLN
100.0000 mL | Freq: Once | INTRAMUSCULAR | Status: AC | PRN
Start: 2023-07-17 — End: 2023-07-17
  Administered 2023-07-17: 100 mL via INTRAVENOUS

## 2023-07-17 MED ORDER — HEPARIN SOD (PORK) LOCK FLUSH 100 UNIT/ML IV SOLN
INTRAVENOUS | Status: AC
  Filled 2023-07-17: qty 5

## 2023-07-17 MED ORDER — OXYCODONE-ACETAMINOPHEN 7.5-325 MG PO TABS
1.0000 | ORAL_TABLET | Freq: Four times a day (QID) | ORAL | 0 refills | Status: DC | PRN
  Filled 2023-07-17: qty 60, 15d supply, fill #0

## 2023-07-17 MED ORDER — HEPARIN SOD (PORK) LOCK FLUSH 100 UNIT/ML IV SOLN
500.0000 [IU] | Freq: Once | INTRAVENOUS | Status: AC
  Administered 2023-07-17: 500 [IU] via INTRAVENOUS

## 2023-07-18 NOTE — Progress Notes (Signed)
[facility] OFFICE PROGRESS NOTE  Tracey Harries, MD [address] [place] [address]  DIAGNOSIS:  1) Stage IV (T3, N2, M1 C) non-small cell lung cancer, adenocarcinoma presented with large right upper lobe lung mass in addition to right hilar and subcarinal lymphadenopathy in addition to multiple metastatic bone lesions involving the pelvis as well as the lower thoracic and lumbar spines in addition to metastatic disease to the pelvic muscles diagnosed in February 2024.  2) deep venous thrombosis of the right lower extremity diagnosed on December 18, 2022   Detected Alteration(s) / Biomarker(s)         Associated FDA-approved therapies  Clinical Trial Availability% cfDNA or Amplification   KRAS G12C approved by FDA Adagrasib, Sotorasib Yes    29.3%   CDK4 Amplification None Yes            High (+++)Plasma Copy Number: 8.0   PD-L1 expression 98%  PRIOR THERAPY: Palliative radiotherapy to the painful bone metastasis under the care of Dr. Mitzi Hansen   CURRENT THERAPY: 1) Systemic chemotherapy with carboplatin for AUC of 5, Alimta 500 Mg/M2 and Keytruda 200 Mg IV every 3 weeks.  First dose November 14, 2022.  Status post 12 cycles.  Working from cycle #5 the patient has been on maintenance treatment with Alimta and Keytruda every 3 weeks. 2) Eliquis 10 mg p.o. twice daily for 7 days followed by 5 mg p.o. twice daily.  First dose 12/26/2022  INTERVAL HISTORY: Maxwell Grant 57 y.o. male returns to the clinic today for a follow-up visit accompanied by his sister.  The patient was last seen by Dr. Arbutus Ped 3 weeks ago.  The patient is currently on maintenance Alimta and Keytruda.  He tolerates this fairly well except for fatigue which is stable. He is followed closely by palliative care.    He is currently on OxyContin 20 mg every 12 hours, oxycodone 7.5 mg 3x per day, Mobic, MiraLAX, and Colace.  He is currently on Eliquis for history of DVT in the past.   He reports  his fatigue is stable. He denies any fever or unexplained weight loss. His appetite is about the same. He reports some stable dyspnea on exertion which is unchanged. Denies any chest pain or hemoptysis.  He has an intermittent chronic cough which he also states is the same.  I gave him a prescription of Tessalon at his last appointment which was not effective for him.  Delsym seems to work okay.  They are wondering about Robitussin.  They are also wondering about COVID, pneumonia, RSV, and flu shot. He continues to smoke cigarettes. He averages 1 ppd. He denies any significant nausea with treatment. Denies any vomiting, diarrhea, or constipation.  Denies any headache or visual changes.  Denies any rashes or skin changes except for dry skin. He recently had a restaging CT scan. He is here today for evaluation and to review his scan before undergoing cycle number 13.     MEDICAL HISTORY: Past Medical History:  Diagnosis Date   CHF (congestive heart failure) (HCC)    COPD (chronic obstructive pulmonary disease) (HCC)    Diabetes mellitus without complication (HCC)    Dyspnea    Hypertension    Lung cancer (HCC) 10/2022   Right leg DVT (HCC) 12/21/2022    ALLERGIES:  is allergic to statins, aspirin, flexeril [cyclobenzaprine], and penicillins.  MEDICATIONS:  Current Outpatient Medications  Medication Sig Dispense Refill   Accu-Chek FastClix Lancets MISC Use  to test blood sugar 4 times a day 102 each 0   albuterol (VENTOLIN HFA) 108 (90 Base) MCG/ACT inhaler Inhale 1-2 puffs into the lungs every 6 (six) hours as needed for wheezing or shortness of breath. 18 g 1   apixaban (ELIQUIS) 5 MG TABS tablet Take 1 tablet (5 mg total) by mouth 2 (two) times daily. 60 tablet 2   benzonatate (TESSALON) 100 MG capsule Take 1 capsule (100 mg total) by mouth 3 (three) times daily as needed. 30 capsule 2   Fluticasone-Umeclidin-Vilant (TRELEGY ELLIPTA) 100-62.5-25 MCG/ACT AEPB Inhale 1 puff into the lungs daily  in the afternoon. 60 each 5   folic acid (FOLVITE) 1 MG tablet Take 1 tablet (1 mg total) by mouth daily. 90 tablet 1   furosemide (LASIX) 20 MG tablet Take 1 tablet (20 mg) by mouth daily as needed. 7 tablet 0   glimepiride (AMARYL) 2 MG tablet Take 2 mg by mouth in the morning.     lidocaine-prilocaine (EMLA) cream Apply to [employer] [facility] [employer] [facility] Prior to Access as needed 30 g 2   meloxicam (MOBIC) 7.5 MG tablet Take 1 tablet (7.5 mg total) by mouth 2 (two) times daily. 60 tablet 2   ondansetron (ZOFRAN) 8 MG tablet Take 1 tablet (8 mg) by mouth every 8 hours as needed for nausea or vomiting. 30 tablet 2   OVER THE COUNTER MEDICATION Take 1 tablet by mouth daily as needed (constipation). " Stool softener     oxyCODONE (OXYCONTIN) 20 mg 12 hr tablet Take 1 tablet (20 mg total) by mouth every 12 (twelve) hours. 60 tablet 0   oxyCODONE-acetaminophen (PERCOCET) 7.5-325 MG tablet Take 1 tablet by mouth every 6 hours as needed for severe pain. 60 tablet 0   prochlorperazine (COMPAZINE) 10 MG tablet Take 1 tablet (10 mg total) by mouth every 6 (six) hours as needed for nausea or vomiting. 30 tablet 0   No current facility-administered medications for this visit.   Facility-Administered Medications Ordered in Other Visits  Medication Dose Route Frequency Provider Last Rate Last Admin   0.9 %  sodium chloride infusion   Intravenous Once Si Gaul, MD       heparin lock flush 100 unit/mL  500 Units Intracatheter Once PRN Si Gaul, MD       pembrolizumab [facility]) 200 mg in sodium chloride 0.9 % 50 mL chemo infusion  200 mg Intravenous Once Si Gaul, MD       PEMEtrexed (ALIMTA) 1,000 mg in sodium chloride 0.9 % 100 mL chemo infusion  500 mg/m2 (Treatment Plan Recorded) Intravenous Once Si Gaul, MD       prochlorperazine (COMPAZINE) tablet 10 mg  10 mg Oral Once Si Gaul, MD       sodium chloride flush (NS) 0.9 % injection 10 mL  10 mL Intracatheter PRN Si Gaul, MD         SURGICAL HISTORY:  Past Surgical History:  Procedure Laterality Date   BRONCHIAL NEEDLE ASPIRATION BIOPSY  10/30/2022   Procedure: BRONCHIAL NEEDLE ASPIRATION BIOPSIES;  Surgeon: Leslye Peer, MD;  Location: [facility] ENDOSCOPY;  Service: Cardiopulmonary;;   IR IMAGING GUIDED PORT INSERTION  11/17/2022   NO PAST SURGERIES     RADIOLOGY WITH ANESTHESIA  10/30/2022   Procedure: MRI WITH ANESTHESIA W/WO CONTRAST;  Surgeon: Leslye Peer, MD;  Location[facility] ENDOSCOPY;  Service: Cardiopulmonary;;   VIDEO BRONCHOSCOPY WITH ENDOBRONCHIAL ULTRASOUND Right 10/30/2022   Procedure: VIDEO BRONCHOSCOPY WITH ENDOBRONCHIAL ULTRASOUND;  Surgeon: Leslye Peer,  MD;  Location: [facility] ENDOSCOPY;  Service: Cardiopulmonary;  Laterality: Right;    REVIEW OF SYSTEMS:   Constitutional: Positive for fatigue. Negative for appetite change, chills, fever and unexpected weight change.  HENT: Negative for mouth sores, nosebleeds, sore throat and trouble swallowing.   Eyes: Negative for eye problems and icterus.  Respiratory: Positive for baseline dyspnea on exertion and stable intermittent cough.  Negative for hemoptysis and wheezing.     Cardiovascular: Negative for chest pain and leg swelling.  Gastrointestinal: Negative for abdominal pain, constipation, diarrhea, nausea and vomiting.  Genitourinary: Negative for bladder incontinence, difficulty urinating, dysuria, frequency and hematuria.   Musculoskeletal: Positive for cancer related bone pain (stable). Negative for gait problem, neck pain and neck stiffness.  Skin: Negative for itching and rash.  Neurological: Negative for dizziness, extremity weakness, gait problem, headaches, light-headedness and seizures.  Hematological: Negative for adenopathy. Does not bruise/bleed easily.  Psychiatric/Behavioral: Negative for confusion, depression and sleep disturbance. The patient is not nervous/anxious.     PHYSICAL EXAMINATION:  Blood pressure (!) 151/85, pulse 77,  temperature 98.5 F (36.9 C), temperature source Oral, resp. rate 14, weight 199 lb 6.4 oz (90.4 kg), SpO2 93%.  ECOG PERFORMANCE STATUS: 1  Physical Exam  Constitutional: Oriented to person, place, and time and well-developed, well-nourished, and in no distress.  HENT:  Head: Normocephalic and atraumatic.  Mouth/Throat: Oropharynx is clear and moist. No oropharyngeal exudate.  Eyes: Conjunctivae are normal. Right eye exhibits no discharge. Left eye exhibits no discharge. No scleral icterus.  Neck: Normal range of motion. Neck supple.  Cardiovascular: Normal rate, regular rhythm, normal heart sounds and intact distal pulses.   Pulmonary/Chest: Effort normal. Quiet breath sounds bilaterally. No respiratory distress. No wheezes. No rales.  Abdominal: Soft. Bowel sounds are normal. Exhibits no distension and no mass. There is no tenderness.  Musculoskeletal: Normal range of motion. Exhibits no significant lower extremity edema, just dry skin.  Lymphadenopathy:    No cervical adenopathy.  Neurological: Alert and oriented to person, place, and time. Exhibits muscle wasting. Examined in the wheelchair.  Skin: Skin is warm and dry. No rash noted. Not diaphoretic. No erythema. No pallor.  Psychiatric: Mood, memory and judgment normal.  Vitals reviewed.  LABORATORY DATA: Lab Results  Component Value Date   WBC 6.4 07/24/2023   HGB 14.4 07/24/2023   HCT 45.5 07/24/2023   MCV 98.1 07/24/2023   PLT 210 07/24/2023      Chemistry      Component Value Date/Time   NA 140 07/24/2023 0829   K 4.2 07/24/2023 0829   CL 101 07/24/2023 0829   CO2 35 (H) 07/24/2023 0829   BUN 14 07/24/2023 0829   CREATININE 0.88 07/24/2023 0829      Component Value Date/Time   CALCIUM 9.7 07/24/2023 0829   ALKPHOS 75 07/24/2023 0829   AST 13 (L) 07/24/2023 0829   ALT 13 07/24/2023 0829   BILITOT 1.0 07/24/2023 0829       RADIOGRAPHIC STUDIES:  CT CHEST ABDOMEN PELVIS W CONTRAST  Result Date:  07/24/2023 CLINICAL DATA:  Non-small cell lung cancer restaging * Tracking Code: BO * EXAM: CT CHEST, ABDOMEN, AND PELVIS WITH CONTRAST TECHNIQUE: Multidetector CT imaging of the chest, abdomen and pelvis was performed following the standard protocol during bolus administration of intravenous contrast. RADIATION DOSE REDUCTION: This exam was performed according to the departmental dose-optimization program which includes automated exposure control, adjustment of the mA and/or kV according to patient size and/or use of iterative  reconstruction technique. CONTRAST:  OMNIPAQUE IOHEXOL 300 MG/ML  SOLN COMPARISON:  Multiple exams, including 05/15/2023 FINDINGS: CT CHEST FINDINGS Cardiovascular: Right Port-A-Cath tip: Cavoatrial junction. Mitral valve calcification. Mediastinum/Nodes: No pathologic adenopathy. Lungs/Pleura: Emphysema. The right upper lobe nodule currently measures 2.5 by 2.7 cm on image 47 series 4, previously 2.8 by 2.9 cm when measured in a similar fashion. Morphology otherwise unchanged. Bilateral airway thickening noted with airway plugging in both lower lobes. Musculoskeletal: Old rib deformities from prior fractures. 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 Inferior manubrial metastatic lesion with suspected pathologic fracture along the anterior cortex similar to prior. Scattered vertebral metastatic lesions in the lower cervical spine and thoracic spine with continued substantial anterior wedging at the T8 vertebral level and some subtle posterior endplate compression and mild posterior retropulsion at T10 similar to previous. No new or progressive osseous metastatic lesions identified in the chest. CT ABDOMEN PELVIS FINDINGS Hepatobiliary: No significant lesion identified. Gallbladder unremarkable. Pancreas: Unremarkable Spleen: Unremarkable Adrenals/Urinary Tract: Stable appearance, no findings of active malignancy. Stomach/Bowel: Unremarkable Vascular/Lymphatic:  Atherosclerosis is present, including aortoiliac atherosclerotic disease. Atheromatous plaque proximally in the SMA without occlusion. No pathologic adenopathy identified. Reproductive: Unremarkable Other: No supplemental non-categorized findings. Musculoskeletal: Stable osseous metastatic disease in the lumbar spine and bony pelvis especially involving the left iliac bone where there is a pathologic fracture extending vertically through the left iliac bone and involving the left SI joint and left upper acetabulum. Widening of the left SI joint as before. Other metastatic lesions including the right iliac bone and L5 vertebral body are similar to previous. Prior interbody fusion at L3-4. IMPRESSION: 1. Minimal reduction size of the right upper lobe nodule, with similar morphology to previous. 2. Stable scattered osseous metastatic disease. This includes stable appearance of pathologic fractures of the left iliac bone extending into the left SI joint and upper acetabulum, and also a small pathologic fracture of the anterior sternal manubrium. 3. Bilateral airway thickening with airway plugging in both lower lobes. 4. Emphysema. 5. 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 6. Aortic and systemic atherosclerosis. Aortic Atherosclerosis (ICD10-I70.0) and Emphysema (ICD10-J43.9). Electronically Signed   By: Gaylyn Rong M.D.   On: 07/24/2023 08:55     ASSESSMENT/PLAN:  This is a very pleasant 57 years old Caucasian male with stage IV (T3, N2, M1 C) non-small cell lung cancer, adenocarcinoma presented with large right upper lobe lung mass in addition to right hilar and subcarinal lymphadenopathy in addition to multiple metastatic bone lesions involving the pelvis as well as the lower thoracic and lumbar spines in addition to metastatic disease to the pelvic muscles diagnosed in February 2024.  Molecular studies showed positive KRAS G12C mutation and PD-L1 expression of 98%.  The  patient is here today to start the first cycle of systemic chemotherapy with carboplatin for AUC of 5, Alimta 500 Mg/M2 and Keytruda 200 Mg IV every 3 weeks.  First dose November 14, 2022.  Status post 12 cycles.  Starting from cycle #5 the patient is on maintenance treatment with Alimta and Keytruda every 3 weeks.   The patient was seen with Dr. Arbutus Ped today.  Dr. Arbutus Ped personally and independently reviewed the scan and discussed results with the patient today.  The scan showed no evidence of disease progression.  Dr. Arbutus Ped recommends he continue on the same treatment at the same dose.   Labs were reviewed. Recommend that he proceed with cycle #13 today as scheduled.    We will see him back  for follow-up visit in 3 weeks for evaluation repeat blood work before undergoing cycle #14.  He is can to take Delsym for his cough.  We will administer the flu shot while in the clinic today.  He was advised that he should take the RSV, COVID, and pneumonia vaccines per the recommended guidelines.  I recommend taking these on and off week of treatment.  Will continue on his blood thinner for his history of DVT.  He will continue to follow with palliative care for his cancer related pain.  His pain is controlled at this time.  He is scheduled to see them at his next appointment.  The patient was advised to call immediately if he has any concerning symptoms in the interval. The patient voices understanding of current disease status and treatment options and is in agreement with the current care plan. All questions were answered. The patient knows to call the clinic with any problems, questions or concerns. We can certainly see the patient much sooner if necessary     No orders of the defined types were placed in this encounter.     Fayth Trefry L Nicandro Perrault, PA-C 07/24/23  ADDENDUM: Hematology/Oncology Attending: Had a face-to-face encounter with the patient today.  I reviewed his record,  lab, scan and recommended his care plan.  This is a very pleasant 57 years old white male with stage IV non-small cell lung cancer, adenocarcinoma diagnosed in February 2024 with positive KRAS G12C mutation and PD-L1 expression of 98%.  The patient underwent palliative radiotherapy to the painful bone metastasis in the spine.  This was followed by systemic chemotherapy with carboplatin, Alimta and Keytruda for 4 cycles and he has been on maintenance treatment with Alimta and Keytruda for additional 8 cycles.  The patient has been tolerating this treatment fairly well with no concerning adverse effects. He had repeat CT scan of the chest, abdomen and pelvis performed recently.  I personally and independently reviewed the scan and discussed the result with the patient and his sister. His scan showed no concerning findings for disease progression and there was further improvement of his disease. I recommended for him to continue his current maintenance treatment with Alimta and Keytruda and he will proceed with cycle #13 today.  The patient will come back for follow-up visit in 3 weeks for evaluation before the next cycle of his treatment. He was advised to call immediately if he has any other concerning symptoms in the interval. The total time spent in the appointment was 30 minutes. Disclaimer: This note was dictated with voice recognition software. Similar sounding words can inadvertently be transcribed and may be missed upon review. Lajuana Matte, MD

## 2023-07-19 ENCOUNTER — Other Ambulatory Visit: Payer: Self-pay

## 2023-07-19 ENCOUNTER — Other Ambulatory Visit (HOSPITAL_COMMUNITY): Payer: Self-pay

## 2023-07-20 ENCOUNTER — Other Ambulatory Visit (HOSPITAL_COMMUNITY): Payer: Self-pay

## 2023-07-20 MED ORDER — ALBUTEROL SULFATE HFA 108 (90 BASE) MCG/ACT IN AERS
1.0000 | INHALATION_SPRAY | Freq: Four times a day (QID) | RESPIRATORY_TRACT | 1 refills | Status: AC | PRN
  Filled 2023-07-20: qty 18, 25d supply, fill #0
  Filled 2023-08-27: qty 18, 25d supply, fill #1

## 2023-07-21 ENCOUNTER — Other Ambulatory Visit: Payer: Self-pay | Admitting: Nurse Practitioner

## 2023-07-21 ENCOUNTER — Other Ambulatory Visit (HOSPITAL_COMMUNITY): Payer: Self-pay

## 2023-07-21 DIAGNOSIS — G893 Neoplasm related pain (acute) (chronic): Secondary | ICD-10-CM

## 2023-07-21 DIAGNOSIS — C3490 Malignant neoplasm of unspecified part of unspecified bronchus or lung: Secondary | ICD-10-CM

## 2023-07-21 DIAGNOSIS — Z515 Encounter for palliative care: Secondary | ICD-10-CM

## 2023-07-23 ENCOUNTER — Other Ambulatory Visit (HOSPITAL_COMMUNITY): Payer: Self-pay

## 2023-07-23 MED ORDER — OXYCODONE HCL ER 20 MG PO T12A
20.0000 mg | EXTENDED_RELEASE_TABLET | Freq: Two times a day (BID) | ORAL | 0 refills | Status: DC
  Filled 2023-07-23: qty 60, 30d supply, fill #0

## 2023-07-24 ENCOUNTER — Inpatient Hospital Stay (HOSPITAL_BASED_OUTPATIENT_CLINIC_OR_DEPARTMENT_OTHER): Payer: Medicaid Other

## 2023-07-24 ENCOUNTER — Inpatient Hospital Stay (HOSPITAL_BASED_OUTPATIENT_CLINIC_OR_DEPARTMENT_OTHER): Payer: Medicaid Other | Admitting: Physician Assistant

## 2023-07-24 ENCOUNTER — Inpatient Hospital Stay: Payer: Medicaid Other | Attending: Internal Medicine

## 2023-07-24 VITALS — BP 151/85 | HR 77 | Temp 98.5°F | Resp 14 | Wt 199.4 lb

## 2023-07-24 DIAGNOSIS — Z9981 Dependence on supplemental oxygen: Secondary | ICD-10-CM | POA: Insufficient documentation

## 2023-07-24 DIAGNOSIS — Z79631 Long term (current) use of antimetabolite agent: Secondary | ICD-10-CM | POA: Diagnosis not present

## 2023-07-24 DIAGNOSIS — C3411 Malignant neoplasm of upper lobe, right bronchus or lung: Secondary | ICD-10-CM | POA: Insufficient documentation

## 2023-07-24 DIAGNOSIS — C3490 Malignant neoplasm of unspecified part of unspecified bronchus or lung: Secondary | ICD-10-CM

## 2023-07-24 DIAGNOSIS — Z5111 Encounter for antineoplastic chemotherapy: Secondary | ICD-10-CM | POA: Diagnosis not present

## 2023-07-24 DIAGNOSIS — I110 Hypertensive heart disease with heart failure: Secondary | ICD-10-CM | POA: Insufficient documentation

## 2023-07-24 DIAGNOSIS — I700 Atherosclerosis of aorta: Secondary | ICD-10-CM | POA: Insufficient documentation

## 2023-07-24 DIAGNOSIS — C7951 Secondary malignant neoplasm of bone: Secondary | ICD-10-CM | POA: Insufficient documentation

## 2023-07-24 DIAGNOSIS — Z86718 Personal history of other venous thrombosis and embolism: Secondary | ICD-10-CM | POA: Diagnosis not present

## 2023-07-24 DIAGNOSIS — Z888 Allergy status to other drugs, medicaments and biological substances status: Secondary | ICD-10-CM | POA: Insufficient documentation

## 2023-07-24 DIAGNOSIS — Z23 Encounter for immunization: Secondary | ICD-10-CM | POA: Diagnosis not present

## 2023-07-24 DIAGNOSIS — I509 Heart failure, unspecified: Secondary | ICD-10-CM | POA: Diagnosis not present

## 2023-07-24 DIAGNOSIS — Z79899 Other long term (current) drug therapy: Secondary | ICD-10-CM | POA: Insufficient documentation

## 2023-07-24 DIAGNOSIS — M84454A Pathological fracture, pelvis, initial encounter for fracture: Secondary | ICD-10-CM | POA: Diagnosis not present

## 2023-07-24 DIAGNOSIS — Z5112 Encounter for antineoplastic immunotherapy: Secondary | ICD-10-CM | POA: Diagnosis present

## 2023-07-24 DIAGNOSIS — C7931 Secondary malignant neoplasm of brain: Secondary | ICD-10-CM | POA: Insufficient documentation

## 2023-07-24 DIAGNOSIS — F17210 Nicotine dependence, cigarettes, uncomplicated: Secondary | ICD-10-CM | POA: Insufficient documentation

## 2023-07-24 DIAGNOSIS — C7800 Secondary malignant neoplasm of unspecified lung: Secondary | ICD-10-CM | POA: Insufficient documentation

## 2023-07-24 DIAGNOSIS — G893 Neoplasm related pain (acute) (chronic): Secondary | ICD-10-CM | POA: Diagnosis not present

## 2023-07-24 DIAGNOSIS — Z7901 Long term (current) use of anticoagulants: Secondary | ICD-10-CM | POA: Insufficient documentation

## 2023-07-24 DIAGNOSIS — Z79620 Long term (current) use of immunosuppressive biologic: Secondary | ICD-10-CM | POA: Diagnosis not present

## 2023-07-24 DIAGNOSIS — Z880 Allergy status to penicillin: Secondary | ICD-10-CM | POA: Diagnosis not present

## 2023-07-24 DIAGNOSIS — J439 Emphysema, unspecified: Secondary | ICD-10-CM | POA: Insufficient documentation

## 2023-07-24 DIAGNOSIS — M954 Acquired deformity of chest and rib: Secondary | ICD-10-CM | POA: Insufficient documentation

## 2023-07-24 DIAGNOSIS — Z886 Allergy status to analgesic agent status: Secondary | ICD-10-CM | POA: Diagnosis not present

## 2023-07-24 DIAGNOSIS — R5383 Other fatigue: Secondary | ICD-10-CM | POA: Insufficient documentation

## 2023-07-24 LAB — CBC WITH DIFFERENTIAL (CANCER CENTER ONLY)
Abs Immature Granulocytes: 0.01 10*3/uL (ref 0.00–0.07)
Basophils Absolute: 0.1 10*3/uL (ref 0.0–0.1)
Basophils Relative: 1 %
Eosinophils Absolute: 0.4 10*3/uL (ref 0.0–0.5)
Eosinophils Relative: 6 %
HCT: 45.5 % (ref 39.0–52.0)
Hemoglobin: 14.4 g/dL (ref 13.0–17.0)
Immature Granulocytes: 0 %
Lymphocytes Relative: 17 %
Lymphs Abs: 1.1 10*3/uL (ref 0.7–4.0)
MCH: 31 pg (ref 26.0–34.0)
MCHC: 31.6 g/dL (ref 30.0–36.0)
MCV: 98.1 fL (ref 80.0–100.0)
Monocytes Absolute: 0.4 10*3/uL (ref 0.1–1.0)
Monocytes Relative: 6 %
Neutro Abs: 4.5 10*3/uL (ref 1.7–7.7)
Neutrophils Relative %: 70 %
Platelet Count: 210 10*3/uL (ref 150–400)
RBC: 4.64 MIL/uL (ref 4.22–5.81)
RDW: 15.1 % (ref 11.5–15.5)
WBC Count: 6.4 10*3/uL (ref 4.0–10.5)
nRBC: 0 % (ref 0.0–0.2)

## 2023-07-24 LAB — CMP (CANCER CENTER ONLY)
ALT: 13 U/L (ref 0–44)
AST: 13 U/L — ABNORMAL LOW (ref 15–41)
Albumin: 4.3 g/dL (ref 3.5–5.0)
Alkaline Phosphatase: 75 U/L (ref 38–126)
Anion gap: 4 — ABNORMAL LOW (ref 5–15)
BUN: 14 mg/dL (ref 6–20)
CO2: 35 mmol/L — ABNORMAL HIGH (ref 22–32)
Calcium: 9.7 mg/dL (ref 8.9–10.3)
Chloride: 101 mmol/L (ref 98–111)
Creatinine: 0.88 mg/dL (ref 0.61–1.24)
GFR, Estimated: >60 mL/min (ref >60)
Glucose, Bld: 124 mg/dL — ABNORMAL HIGH (ref 70–99)
Potassium: 4.2 mmol/L (ref 3.5–5.1)
Sodium: 140 mmol/L (ref 135–145)
Total Bilirubin: 1 mg/dL (ref <1.2)
Total Protein: 7.3 g/dL (ref 6.5–8.1)

## 2023-07-24 MED ORDER — INFLUENZA VIRUS VACC SPLIT PF (FLUZONE) 0.5 ML IM SUSY
0.5000 mL | PREFILLED_SYRINGE | Freq: Once | INTRAMUSCULAR | Status: AC
  Administered 2023-07-24: 0.5 mL via INTRAMUSCULAR
  Filled 2023-07-24: qty 0.5

## 2023-07-24 MED ORDER — SODIUM CHLORIDE 0.9 % IV SOLN: Freq: Once | INTRAVENOUS | Status: AC

## 2023-07-24 MED ORDER — SODIUM CHLORIDE 0.9% FLUSH
10.0000 mL | INTRAVENOUS | Status: DC | PRN
  Administered 2023-07-24: 10 mL

## 2023-07-24 MED ORDER — SODIUM CHLORIDE 0.9 % IV SOLN
200.0000 mg | Freq: Once | INTRAVENOUS | Status: AC
  Administered 2023-07-24: 200 mg via INTRAVENOUS
  Filled 2023-07-24: qty 200

## 2023-07-24 MED ORDER — PROCHLORPERAZINE MALEATE 10 MG PO TABS
10.0000 mg | ORAL_TABLET | Freq: Once | ORAL | Status: AC
  Administered 2023-07-24: 10 mg via ORAL
  Filled 2023-07-24: qty 1

## 2023-07-24 MED ORDER — SODIUM CHLORIDE 0.9 % IV SOLN
500.0000 mg/m2 | Freq: Once | INTRAVENOUS | Status: AC
  Administered 2023-07-24: 1000 mg via INTRAVENOUS
  Filled 2023-07-24: qty 40

## 2023-07-24 MED ORDER — HEPARIN SOD (PORK) LOCK FLUSH 100 UNIT/ML IV SOLN
500.0000 [IU] | Freq: Once | INTRAVENOUS | Status: AC | PRN
  Administered 2023-07-24: 500 [IU]

## 2023-07-24 MED ORDER — INFLUENZA VIRUS VACC SPLIT PF (FLUZONE) 0.5 ML IM SUSY: 0.5000 mL | PREFILLED_SYRINGE | Freq: Once | INTRAMUSCULAR | Status: DC

## 2023-07-24 NOTE — Patient Instructions (Signed)
[facility] - A DEPT OF [facility]  Discharge Instructions: Thank you for choosing [facility] to provide your oncology and hematology care.   If you have a lab appointment with the Cancer Center, please go directly to the Cancer Center and check in at the registration area.   Wear comfortable clothing and clothing appropriate for easy access to any Portacath or PICC line.   We strive to give you quality time with your provider. You may need to reschedule your appointment if you arrive late (15 or more minutes).  Arriving late affects you and other patients whose appointments are after yours.  Also, if you miss three or more appointments without notifying the office, you may be dismissed from the clinic at the provider's discretion.      For prescription refill requests, have your pharmacy contact our office and allow 72 hours for refills to be completed.    Today you received the following chemotherapy and/or immunotherapy agents: Keytruda, Alimta.       To help prevent nausea and vomiting after your treatment, we encourage you to take your nausea medication as directed.  BELOW ARE SYMPTOMS THAT SHOULD BE REPORTED IMMEDIATELY: *FEVER GREATER THAN 100.4 F (38 C) OR HIGHER *CHILLS OR SWEATING *NAUSEA AND VOMITING THAT IS NOT CONTROLLED WITH YOUR NAUSEA MEDICATION *UNUSUAL SHORTNESS OF BREATH *UNUSUAL BRUISING OR BLEEDING *URINARY PROBLEMS (pain or burning when urinating, or frequent urination) *BOWEL PROBLEMS (unusual diarrhea, constipation, pain near the anus) TENDERNESS IN MOUTH AND THROAT WITH OR WITHOUT PRESENCE OF ULCERS (sore throat, sores in mouth, or a toothache) UNUSUAL RASH, SWELLING OR PAIN  UNUSUAL VAGINAL DISCHARGE OR ITCHING   Items with * indicate a potential emergency and should be followed up as soon as possible or go to the Emergency Department if any problems should occur.  Please show the CHEMOTHERAPY ALERT CARD or  IMMUNOTHERAPY ALERT CARD at check-in to the Emergency Department and triage nurse.  Should you have questions after your visit or need to cancel or reschedule your appointment, please contact [facility] - A DEPT OF [facility]  Dept: [phone]  and follow the prompts.  Office hours are 8:00 a.m. to 4:30 p.m. Monday - Friday. Please note that voicemails left after 4:00 p.m. may not be returned until the following business day.  We are closed weekends and major holidays. You have access to a nurse at all times for urgent questions. Please call the main number to the clinic Dept: [phone] and follow the prompts.   For any non-urgent questions, you may also contact your provider using MyChart. We now offer e-Visits for anyone 80 and older to request care online for non-urgent symptoms. For details visit [email or web address].   Also download the MyChart app! Go to the app store, search "MyChart", open the app, select [facility], and log in with your MyChart username and password.

## 2023-07-30 ENCOUNTER — Other Ambulatory Visit (HOSPITAL_COMMUNITY): Payer: Self-pay

## 2023-08-01 ENCOUNTER — Encounter: Payer: Self-pay | Admitting: Internal Medicine

## 2023-08-05 ENCOUNTER — Other Ambulatory Visit: Payer: Self-pay

## 2023-08-09 ENCOUNTER — Other Ambulatory Visit (HOSPITAL_COMMUNITY): Payer: Self-pay

## 2023-08-09 ENCOUNTER — Telehealth: Payer: Self-pay

## 2023-08-09 ENCOUNTER — Other Ambulatory Visit: Payer: Self-pay | Admitting: Pulmonary Disease

## 2023-08-09 ENCOUNTER — Other Ambulatory Visit: Payer: Self-pay

## 2023-08-09 ENCOUNTER — Encounter (HOSPITAL_COMMUNITY): Payer: Self-pay

## 2023-08-09 DIAGNOSIS — Z515 Encounter for palliative care: Secondary | ICD-10-CM

## 2023-08-09 DIAGNOSIS — G893 Neoplasm related pain (acute) (chronic): Secondary | ICD-10-CM

## 2023-08-09 DIAGNOSIS — C3490 Malignant neoplasm of unspecified part of unspecified bronchus or lung: Secondary | ICD-10-CM

## 2023-08-09 MED ORDER — OXYCODONE-ACETAMINOPHEN 7.5-325 MG PO TABS
1.0000 | ORAL_TABLET | Freq: Four times a day (QID) | ORAL | 0 refills | Status: DC | PRN
  Filled 2023-08-11: qty 60, 15d supply, fill #0

## 2023-08-09 NOTE — Telephone Encounter (Signed)
Former patient of Dr.Sood. Needs refill of Eliquis. Last Seen April of 2024  Pharmacy [facility]

## 2023-08-09 NOTE — Progress Notes (Signed)
[facility] OFFICE PROGRESS NOTE  Tracey Harries, MD [address] [place] [address]  DIAGNOSIS:  1) Stage IV (T3, N2, M1 C) non-small cell lung cancer, adenocarcinoma presented with large right upper lobe lung mass in addition to right hilar and subcarinal lymphadenopathy in addition to multiple metastatic bone lesions involving the pelvis as well as the lower thoracic and lumbar spines in addition to metastatic disease to the pelvic muscles diagnosed in February 2024.  2) deep venous thrombosis of the right lower extremity diagnosed on December 18, 2022   Detected Alteration(s) / Biomarker(s)         Associated FDA-approved therapies  Clinical Trial Availability% cfDNA or Amplification   KRAS G12C approved by FDA Adagrasib, Sotorasib Yes    29.3%   CDK4 Amplification None Yes            High (+++)Plasma Copy Number: 8.0   PD-L1 expression 98%  PRIOR THERAPY: Palliative radiotherapy to the painful bone metastasis under the care of Dr. Mitzi Hansen   CURRENT THERAPY: 1) Systemic chemotherapy with carboplatin for AUC of 5, Alimta 500 Mg/M2 and Keytruda 200 Mg IV every 3 weeks.  First dose November 14, 2022.  Status post 13 cycles.  Working from cycle #5 the patient has been on maintenance treatment with Alimta and Keytruda every 3 weeks. 2) Eliquis 10 mg p.o. twice daily for 7 days followed by 5 mg p.o. twice daily.  First dose 12/26/2022  INTERVAL HISTORY: Maxwell Grant 57 y.o. male returns to the clinic today for a follow-up visit accompanied by his sister.  The patient was last seen by Dr. Arbutus Ped 3 weeks ago.  The patient is currently on maintenance Alimta and Keytruda.  He tolerates this fairly well except for fatigue which is stable. He is followed closely by palliative care. He denies any changes in his health. He has supplemental oxygen at home to wear if his oxygen is less than 88, he only wears it at night. His oxygen is 88-90 in the office today. He refused  to wear his oxygen.   He is currently on OxyContin 20 mg every 12 hours, oxycodone 7.5 mg 3x per day, Mobic, MiraLAX, and Colace. He is currently on Eliquis for history of DVT in the past.   He reports his fatigue is stable. He denies any fever or unexplained weight loss. His appetite is about the same. He reports some stable dyspnea on exertion which is unchanged. Denies any chest pain or hemoptysis.  He has an intermittent chronic cough which he also states is the same. He continues to smoke cigarettes. He averages 1 ppd. He denies any significant nausea with treatment. Denies any vomiting, diarrhea, or constipation. Denies any headache or visual changes. Denies any rashes or skin changes except for dry skin.  He is here today for evaluation repeat blood work before undergoing cycle #14.  MEDICAL HISTORY: Past Medical History:  Diagnosis Date   CHF (congestive heart failure) (HCC)    COPD (chronic obstructive pulmonary disease) (HCC)    Diabetes mellitus without complication (HCC)    Dyspnea    Hypertension    Lung cancer (HCC) 10/2022   Right leg DVT (HCC) 12/21/2022    ALLERGIES:  is allergic to statins, aspirin, flexeril [cyclobenzaprine], and penicillins.  MEDICATIONS:  Current Outpatient Medications  Medication Sig Dispense Refill   Accu-Chek FastClix Lancets MISC Use to test blood sugar 4 times a day 102 each 0   albuterol (VENTOLIN HFA)  108 (90 Base) MCG/ACT inhaler Inhale 1-2 puffs into the lungs every 6 (six) hours as needed for wheezing or shortness of breath. 18 g 1   apixaban (ELIQUIS) 5 MG TABS tablet Take 1 tablet (5 mg total) by mouth 2 (two) times daily. 60 tablet 4   benzonatate (TESSALON) 100 MG capsule Take 1 capsule (100 mg total) by mouth 3 (three) times daily as needed. 30 capsule 2   Fluticasone-Umeclidin-Vilant (TRELEGY ELLIPTA) 100-62.5-25 MCG/ACT AEPB Inhale 1 puff into the lungs daily in the afternoon. 60 each 5   folic acid (FOLVITE) 1 MG tablet Take 1 tablet  (1 mg total) by mouth daily. 90 tablet 1   furosemide (LASIX) 20 MG tablet Take 1 tablet (20 mg) by mouth daily as needed. 7 tablet 0   glimepiride (AMARYL) 2 MG tablet Take 2 mg by mouth in the morning.     lidocaine-prilocaine (EMLA) cream Apply to [facility] Prior to Access as needed 30 g 2   meloxicam (MOBIC) 7.5 MG tablet Take 1 tablet (7.5 mg total) by mouth 2 (two) times daily. 60 tablet 2   ondansetron (ZOFRAN) 8 MG tablet Take 1 tablet (8 mg) by mouth every 8 hours as needed for nausea or vomiting. 30 tablet 2   OVER THE COUNTER MEDICATION Take 1 tablet by mouth daily as needed (constipation). " Stool softener     oxyCODONE (OXYCONTIN) 20 mg 12 hr tablet Take 1 tablet (20 mg total) by mouth every 12 (twelve) hours. 60 tablet 0   oxyCODONE-acetaminophen (PERCOCET) 7.5-325 MG tablet Take 1 tablet by mouth every 6 (six) hours as needed for severe pain (pain score 7-10). 60 tablet 0   prochlorperazine (COMPAZINE) 10 MG tablet Take 1 tablet (10 mg total) by mouth every 6 (six) hours as needed for nausea or vomiting. 30 tablet 0   No current facility-administered medications for this visit.   Facility-Administered Medications Ordered in Other Visits  Medication Dose Route Frequency Provider Last Rate Last Admin   0.9 %  sodium chloride infusion   Intravenous Once Si Gaul, MD       heparin lock flush 100 unit/mL  500 Units Intracatheter Once PRN Si Gaul, MD       pembrolizumab [facility]) 200 mg in sodium chloride 0.9 % 50 mL chemo infusion  200 mg Intravenous Once Si Gaul, MD       PEMEtrexed (ALIMTA) 1,000 mg in sodium chloride 0.9 % 100 mL chemo infusion  500 mg/m2 (Treatment Plan Recorded) Intravenous Once Si Gaul, MD       prochlorperazine (COMPAZINE) tablet 10 mg  10 mg Oral Once Si Gaul, MD       sodium chloride flush (NS) 0.9 % injection 10 mL  10 mL Intracatheter PRN Si Gaul, MD        SURGICAL HISTORY:  Past Surgical History:   Procedure Laterality Date   BRONCHIAL NEEDLE ASPIRATION BIOPSY  10/30/2022   Procedure: BRONCHIAL NEEDLE ASPIRATION BIOPSIES;  Surgeon: Leslye Peer, MD;  Location: [facility] ENDOSCOPY;  Service: Cardiopulmonary;;   IR IMAGING GUIDED PORT INSERTION  11/17/2022   NO PAST SURGERIES     RADIOLOGY WITH ANESTHESIA  10/30/2022   Procedure: MRI WITH ANESTHESIA W/WO CONTRAST;  Surgeon: Leslye Peer, MD;  Location: [facility] ENDOSCOPY;  Service: Cardiopulmonary;;   VIDEO BRONCHOSCOPY WITH ENDOBRONCHIAL ULTRASOUND Right 10/30/2022   Procedure: VIDEO BRONCHOSCOPY WITH ENDOBRONCHIAL ULTRASOUND;  Surgeon: Leslye Peer, MD;  Location: [facility] ENDOS[facility];  Service: Cardiopulmonary;  Laterality: Right;  REVIEW OF SYSTEMS:   Constitutional: Positive for fatigue. Negative for appetite change, chills, fever and unexpected weight change.  HENT: Negative for mouth sores, nosebleeds, sore throat and trouble swallowing.   Eyes: Negative for eye problems and icterus.  Respiratory: Positive for baseline dyspnea on exertion and stable intermittent cough.  Negative for hemoptysis and wheezing.     Cardiovascular: Negative for chest pain and leg swelling.  Gastrointestinal: Negative for abdominal pain, constipation, diarrhea, nausea and vomiting.  Genitourinary: Negative for bladder incontinence, difficulty urinating, dysuria, frequency and hematuria.   Musculoskeletal: Positive for cancer related bone pain (stable). Negative for gait problem, neck pain and neck stiffness.  Skin: Negative for itching and rash.  Neurological: Negative for dizziness, extremity weakness, gait problem, headaches, light-headedness and seizures.  Hematological: Negative for adenopathy. Does not bruise/bleed easily.  Psychiatric/Behavioral: Negative for confusion, depression and sleep disturbance. The patient is not nervous/anxious.     PHYSICAL EXAMINATION:  Blood pressure 132/74, pulse 74, temperature 99.8 F (37.7 C), temperature source Temporal,  resp. rate 18, weight 196 lb 12.8 oz (89.3 kg), SpO2 (!) 88%.  ECOG PERFORMANCE STATUS: 1  Physical Exam  Constitutional: Oriented to person, place, and time and well-developed, well-nourished, and in no distress.  HENT:  Head: Normocephalic and atraumatic.  Mouth/Throat: Oropharynx is clear and moist. No oropharyngeal exudate.  Eyes: Conjunctivae are normal. Right eye exhibits no discharge. Left eye exhibits no discharge. No scleral icterus.  Neck: Normal range of motion. Neck supple.  Cardiovascular: Normal rate, regular rhythm, normal heart sounds and intact distal pulses.   Pulmonary/Chest: Effort normal. Quiet breath sounds bilaterally. No respiratory distress. No wheezes. No rales.  Abdominal: Soft. Bowel sounds are normal. Exhibits no distension and no mass. There is no tenderness.  Musculoskeletal: Normal range of motion. Exhibits no significant lower extremity edema, just dry skin.  Lymphadenopathy:    No cervical adenopathy.  Neurological: Alert and oriented to person, place, and time. Exhibits muscle wasting. Examined in the wheelchair.  Skin: Skin is warm and dry. No rash noted. Not diaphoretic. No erythema. No pallor.  Psychiatric: Mood, memory and judgment normal.  Vitals reviewed.  LABORATORY DATA: Lab Results  Component Value Date   WBC 5.6 08/14/2023   HGB 13.6 08/14/2023   HCT 42.5 08/14/2023   MCV 97.9 08/14/2023   PLT 205 08/14/2023      Chemistry      Component Value Date/Time   NA 141 08/14/2023 1047   K 4.0 08/14/2023 1047   CL 101 08/14/2023 1047   CO2 37 (H) 08/14/2023 1047   BUN 13 08/14/2023 1047   CREATININE 0.92 08/14/2023 1047      Component Value Date/Time   CALCIUM 9.5 08/14/2023 1047   ALKPHOS 72 08/14/2023 1047   AST 16 08/14/2023 1047   ALT 10 08/14/2023 1047   BILITOT 0.8 08/14/2023 1047       RADIOGRAPHIC STUDIES:  CT CHEST ABDOMEN PELVIS W CONTRAST  Result Date: 07/24/2023 CLINICAL DATA:  Non-small cell lung cancer  restaging * Tracking Code: BO * EXAM: CT CHEST, ABDOMEN, AND PELVIS WITH CONTRAST TECHNIQUE: Multidetector CT imaging of the chest, abdomen and pelvis was performed following the standard protocol during bolus administration of intravenous contrast. RADIATION DOSE REDUCTION: This exam was performed according to the departmental dose-optimization program which includes automated exposure control, adjustment of the mA and/or kV according to patient size and/or use of iterative reconstruction technique. CONTRAST:  OMNIPAQUE IOHEXOL 300 MG/ML  SOLN COMPARISON:  Multiple exams,  including 05/15/2023 FINDINGS: CT CHEST FINDINGS Cardiovascular: Right Port-A-Cath tip: Cavoatrial junction. Mitral valve calcification. Mediastinum/Nodes: No pathologic adenopathy. Lungs/Pleura: Emphysema. The right upper lobe nodule currently measures 2.5 by 2.7 cm on image 47 series 4, previously 2.8 by 2.9 cm when measured in a similar fashion. Morphology otherwise unchanged. Bilateral airway thickening noted with airway plugging in both lower lobes. Musculoskeletal: Old rib deformities from prior fractures. 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 Inferior manubrial metastatic lesion with suspected pathologic fracture along the anterior cortex similar to prior. Scattered vertebral metastatic lesions in the lower cervical spine and thoracic spine with continued substantial anterior wedging at the T8 vertebral level and some subtle posterior endplate compression and mild posterior retropulsion at T10 similar to previous. No new or progressive osseous metastatic lesions identified in the chest. CT ABDOMEN PELVIS FINDINGS Hepatobiliary: No significant lesion identified. Gallbladder unremarkable. Pancreas: Unremarkable Spleen: Unremarkable Adrenals/Urinary Tract: Stable appearance, no findings of active malignancy. Stomach/Bowel: Unremarkable Vascular/Lymphatic: Atherosclerosis is present, including aortoiliac  atherosclerotic disease. Atheromatous plaque proximally in the SMA without occlusion. No pathologic adenopathy identified. Reproductive: Unremarkable Other: No supplemental non-categorized findings. Musculoskeletal: Stable osseous metastatic disease in the lumbar spine and bony pelvis especially involving the left iliac bone where there is a pathologic fracture extending vertically through the left iliac bone and involving the left SI joint and left upper acetabulum. Widening of the left SI joint as before. Other metastatic lesions including the right iliac bone and L5 vertebral body are similar to previous. Prior interbody fusion at L3-4. IMPRESSION: 1. Minimal reduction size of the right upper lobe nodule, with similar morphology to previous. 2. Stable scattered osseous metastatic disease. This includes stable appearance of pathologic fractures of the left iliac bone extending into the left SI joint and upper acetabulum, and also a small pathologic fracture of the anterior sternal manubrium. 3. Bilateral airway thickening with airway plugging in both lower lobes. 4. Emphysema. 5. 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 6. Aortic and systemic atherosclerosis. Aortic Atherosclerosis (ICD10-I70.0) and Emphysema (ICD10-J43.9). Electronically Signed   By: Gaylyn Rong M.D.   On: 07/24/2023 08:55     ASSESSMENT/PLAN:  This is a very pleasant 57 years old Caucasian male with stage IV (T3, N2, M1 C) non-small cell lung cancer, adenocarcinoma presented with large right upper lobe lung mass in addition to right hilar and subcarinal lymphadenopathy in addition to multiple metastatic bone lesions involving the pelvis as well as the lower thoracic and lumbar spines in addition to metastatic disease to the pelvic muscles diagnosed in February 2024.  Molecular studies showed positive KRAS G12C mutation and PD-L1 expression of 98%.   The patient is here today to start the first cycle of  systemic chemotherapy with carboplatin for AUC of 5, Alimta 500 Mg/M2 and Keytruda 200 Mg IV every 3 weeks.  First dose November 14, 2022.  Status post 13 cycles.  Starting from cycle #5 the patient is on maintenance treatment with Alimta and Keytruda every 3 weeks.   Labs were reviewed. Recommend that he proceed with cycle #14 today as scheduled.    We will see him back for follow-up visit in 3 weeks for evaluation repeat blood work before undergoing cycle #15.   We discussed the importance of wearing his supplemental oxygen and the risks with hypoxia. He was getting frustrated saying he has this discussion at every appointment. He refused his oxygen today but has supplemental oxygen at home if needed which he only wears at night. I  let him know our strong recommendation is to wear his oxygen PRN to keep his oxygen >88%  Will continue on his blood thinner for his history of DVT.   He will continue to follow with palliative care for his cancer related pain.  His pain is controlled at this time.  He is scheduled to see them at his next appointment.  The patient was advised to call immediately if he has any concerning symptoms in the interval. The patient voices understanding of current disease status and treatment options and is in agreement with the current care plan. All questions were answered. The patient knows to call the clinic with any problems, questions or concerns. We can certainly see the patient much sooner if necessary    No orders of the defined types were placed in this encounter.    The total time spent in the appointment was 20-29 minutes  Alain Deschene L Marshella Tello, PA-C 08/14/23

## 2023-08-10 ENCOUNTER — Other Ambulatory Visit (HOSPITAL_COMMUNITY): Payer: Self-pay

## 2023-08-10 ENCOUNTER — Encounter (HOSPITAL_COMMUNITY): Payer: Self-pay

## 2023-08-10 ENCOUNTER — Encounter (HOSPITAL_COMMUNITY): Payer: Self-pay | Admitting: Pharmacist

## 2023-08-10 MED ORDER — APIXABAN 5 MG PO TABS
5.0000 mg | ORAL_TABLET | Freq: Two times a day (BID) | ORAL | 4 refills | Status: DC
  Filled 2023-08-10: qty 60, 30d supply, fill #0
  Filled 2023-09-05: qty 60, 30d supply, fill #1
  Filled 2023-10-06: qty 60, 30d supply, fill #2
  Filled 2023-11-07: qty 60, 30d supply, fill #3
  Filled 2023-12-05: qty 60, 30d supply, fill #4

## 2023-08-10 NOTE — Telephone Encounter (Signed)
Eliquis prescription refilled.  Called and spoke with Vladimir Faster and made her aware.  She verbalized understanding.  Nothing further needed.

## 2023-08-10 NOTE — Telephone Encounter (Signed)
[facility] is calling. Patient still needs a refill of Eliquis. Patient is completely out of medication.

## 2023-08-11 ENCOUNTER — Encounter (HOSPITAL_COMMUNITY): Payer: Self-pay

## 2023-08-11 ENCOUNTER — Other Ambulatory Visit (HOSPITAL_COMMUNITY): Payer: Self-pay

## 2023-08-13 ENCOUNTER — Other Ambulatory Visit (HOSPITAL_COMMUNITY): Payer: Self-pay

## 2023-08-13 ENCOUNTER — Encounter: Payer: Self-pay | Admitting: Internal Medicine

## 2023-08-13 NOTE — Progress Notes (Unsigned)
Palliative Medicine [facility]  [phone] [phone]   Name: Maxwell Grant Date: 08/13/2023 MRN: [id number]  DOB: June 02, 1966  Patient Care Team: Tracey Harries, MD as PCP - General (Family Medicine)    INTERVAL HISTORY: Maxwell Grant is a 57 y.o. male with  oncologic medical history including new diagnosed non-small cell lung cancer (10/2022), as well as a history of COPD, diabetes mellitus, hypertension, dyslipidemia as well as history of osteomyelitis of the back.  Palliative ask to see for symptom and pain management and goals of care.     SOCIAL HISTORY:    Maxwell Grant reports that he has been smoking cigarettes. He has never used smokeless tobacco. He reports current alcohol use of about 1.0 standard drink of alcohol per week. He reports current drug use. Drug: Marijuana.  ADVANCE DIRECTIVES: none on file   CODE STATUS: Full Code  PAST MEDICAL HISTORY: Past Medical History:  Diagnosis Date  . CHF (congestive heart failure) (HCC)   . COPD (chronic obstructive pulmonary disease) (HCC)   . Diabetes mellitus without complication (HCC)   . Dyspnea   . Hypertension   . Lung cancer (HCC) 10/2022  . Right leg DVT (HCC) 12/21/2022    ALLERGIES:  is allergic to statins, aspirin, flexeril [cyclobenzaprine], and penicillins.  MEDICATIONS:  Current Outpatient Medications  Medication Sig Dispense Refill  . Accu-Chek FastClix Lancets MISC Use to test blood sugar 4 times a day 102 each 0  . albuterol (VENTOLIN HFA) 108 (90 Base) MCG/ACT inhaler Inhale 1-2 puffs into the lungs every 6 (six) hours as needed for wheezing or shortness of breath. 18 g 1  . apixaban (ELIQUIS) 5 MG TABS tablet Take 1 tablet (5 mg total) by mouth 2 (two) times daily. 60 tablet 4  . benzonatate (TESSALON) 100 MG capsule Take 1 capsule (100 mg total) by mouth 3 (three) times daily as needed. 30 capsule 2  . Fluticasone-Umeclidin-Vilant (TRELEGY ELLIPTA)  100-62.5-25 MCG/ACT AEPB Inhale 1 puff into the lungs daily in the afternoon. 60 each 5  . folic acid (FOLVITE) 1 MG tablet Take 1 tablet (1 mg total) by mouth daily. 90 tablet 1  . furosemide (LASIX) 20 MG tablet Take 1 tablet (20 mg) by mouth daily as needed. 7 tablet 0  . glimepiride (AMARYL) 2 MG tablet Take 2 mg by mouth in the morning.    . lidocaine-prilocaine (EMLA) cream Apply to [facility] Prior to Access as needed 30 g 2  . meloxicam (MOBIC) 7.5 MG tablet Take 1 tablet (7.5 mg total) by mouth 2 (two) times daily. 60 tablet 2  . ondansetron (ZOFRAN) 8 MG tablet Take 1 tablet (8 mg) by mouth every 8 hours as needed for nausea or vomiting. 30 tablet 2  . OVER THE COUNTER MEDICATION Take 1 tablet by mouth daily as needed (constipation). " Stool softener    . oxyCODONE (OXYCONTIN) 20 mg 12 hr tablet Take 1 tablet (20 mg total) by mouth every 12 (twelve) hours. 60 tablet 0  . oxyCODONE-acetaminophen (PERCOCET) 7.5-325 MG tablet Take 1 tablet by mouth every 6 (six) hours as needed for severe pain (pain score 7-10). 60 tablet 0  . prochlorperazine (COMPAZINE) 10 MG tablet Take 1 tablet (10 mg total) by mouth every 6 (six) hours as needed for nausea or vomiting. 30 tablet 0   No current facility-administered medications for this visit.    VITAL SIGNS: There were no vitals taken for this visit. There were no vitals  filed for this visit.  Estimated body mass index is 32.18 kg/m as calculated from the following:   Height as of 07/03/23: 5\' 6"  (1.676 m).   Weight as of 07/24/23: 199 lb 6.4 oz (90.4 kg).   PERFORMANCE STATUS (ECOG) : 2 - Symptomatic, <50% confined to bed   Physical Exam General: NAD Cardiovascular: regular rate and rhythm Skin: no rashes Neurological: oriented x 4   IMPRESSION: I saw Maxwell Grant during infusion. No acute distress. Tolerating infusion without difficulty. Is remaining as active as possible. Denies nausea, vomiting, constipation, or diarrhea. Appetite is fair.  Some days are better than others.   Neoplasm related pain Maxwell Grant reports pain overall is well controlled. Some days are better than others. He is complaining of increase arthritic pain in his joints. Taking his mobic as prescribed. We discussed increasing twice daily.   We discussed current regimen. He is taking OxyContin 20mg  very 12 hours and Percocet as needed for breakthrough pain. Not requiring around the clock. Is taking medications as prescribed. This is evident by refill request.   We will continue to closely monitor and adjust medications as needed.     We discussed the importance of continued conversation with family and their medical providers regarding overall plan of care and treatment options, ensuring decisions are within the context of the patients values and GOCs.  PLAN: OxyContin 20 mg every 12 hours.  Oxycodone 7.5/325 -15mg  every 6 hours as needed for breakthrough pain. Not requiring around the clock.  Mobic 7.5 mg twice daily. MiraLAX twice daily. Colace daily. Ongoing symptom management support and goals of care discussions. Palliative will plan to see patient back in 3-4 weeks in collaboration to other oncology appointments.  Knows to contact office sooner if needed.   Patient expressed understanding and was in agreement with this plan. He also understands that He can call the clinic at any time with any questions, concerns, or complaints.    Any controlled substances utilized were prescribed in the context of palliative care. PDMP has been reviewed.   Visit consisted of counseling and education dealing with the complex and emotionally intense issues of symptom management and palliative care in the setting of serious and potentially life-threatening illness.Greater than 50%  of this time was spent counseling and coordinating care related to the above assessment and plan.  [name], AGPCNP-BC  Palliative Medicine Team/[facility]  *Please note that this is a verbal dictation therefore any spelling or grammatical errors are due to the "[employer] One" system interpretation.

## 2023-08-14 ENCOUNTER — Inpatient Hospital Stay: Payer: Medicaid Other

## 2023-08-14 ENCOUNTER — Inpatient Hospital Stay (HOSPITAL_BASED_OUTPATIENT_CLINIC_OR_DEPARTMENT_OTHER): Payer: Medicaid Other | Admitting: Physician Assistant

## 2023-08-14 ENCOUNTER — Encounter: Payer: Self-pay | Admitting: Nurse Practitioner

## 2023-08-14 ENCOUNTER — Inpatient Hospital Stay (HOSPITAL_BASED_OUTPATIENT_CLINIC_OR_DEPARTMENT_OTHER): Payer: Medicaid Other | Admitting: Nurse Practitioner

## 2023-08-14 VITALS — BP 132/74 | HR 74 | Temp 99.8°F | Resp 18 | Wt 196.8 lb

## 2023-08-14 DIAGNOSIS — Z5112 Encounter for antineoplastic immunotherapy: Secondary | ICD-10-CM | POA: Diagnosis not present

## 2023-08-14 DIAGNOSIS — G893 Neoplasm related pain (acute) (chronic): Secondary | ICD-10-CM

## 2023-08-14 DIAGNOSIS — C3490 Malignant neoplasm of unspecified part of unspecified bronchus or lung: Secondary | ICD-10-CM

## 2023-08-14 DIAGNOSIS — Z5111 Encounter for antineoplastic chemotherapy: Secondary | ICD-10-CM | POA: Diagnosis not present

## 2023-08-14 DIAGNOSIS — Z95828 Presence of other vascular implants and grafts: Secondary | ICD-10-CM

## 2023-08-14 DIAGNOSIS — Z515 Encounter for palliative care: Secondary | ICD-10-CM

## 2023-08-14 DIAGNOSIS — K5903 Drug induced constipation: Secondary | ICD-10-CM | POA: Diagnosis not present

## 2023-08-14 LAB — CBC WITH DIFFERENTIAL (CANCER CENTER ONLY)
Abs Immature Granulocytes: 0.01 10*3/uL (ref 0.00–0.07)
Basophils Absolute: 0 10*3/uL (ref 0.0–0.1)
Basophils Relative: 1 %
Eosinophils Absolute: 0.4 10*3/uL (ref 0.0–0.5)
Eosinophils Relative: 7 %
HCT: 42.5 % (ref 39.0–52.0)
Hemoglobin: 13.6 g/dL (ref 13.0–17.0)
Immature Granulocytes: 0 %
Lymphocytes Relative: 17 %
Lymphs Abs: 0.9 10*3/uL (ref 0.7–4.0)
MCH: 31.3 pg (ref 26.0–34.0)
MCHC: 32 g/dL (ref 30.0–36.0)
MCV: 97.9 fL (ref 80.0–100.0)
Monocytes Absolute: 0.4 10*3/uL (ref 0.1–1.0)
Monocytes Relative: 8 %
Neutro Abs: 3.8 10*3/uL (ref 1.7–7.7)
Neutrophils Relative %: 67 %
Platelet Count: 205 10*3/uL (ref 150–400)
RBC: 4.34 MIL/uL (ref 4.22–5.81)
RDW: 14.9 % (ref 11.5–15.5)
WBC Count: 5.6 10*3/uL (ref 4.0–10.5)
nRBC: 0 % (ref 0.0–0.2)

## 2023-08-14 LAB — CMP (CANCER CENTER ONLY)
ALT: 10 U/L (ref 0–44)
AST: 16 U/L (ref 15–41)
Albumin: 4 g/dL (ref 3.5–5.0)
Alkaline Phosphatase: 72 U/L (ref 38–126)
Anion gap: 3 — ABNORMAL LOW (ref 5–15)
BUN: 13 mg/dL (ref 6–20)
CO2: 37 mmol/L — ABNORMAL HIGH (ref 22–32)
Calcium: 9.5 mg/dL (ref 8.9–10.3)
Chloride: 101 mmol/L (ref 98–111)
Creatinine: 0.92 mg/dL (ref 0.61–1.24)
GFR, Estimated: >60 mL/min (ref >60)
Glucose, Bld: 101 mg/dL — ABNORMAL HIGH (ref 70–99)
Potassium: 4 mmol/L (ref 3.5–5.1)
Sodium: 141 mmol/L (ref 135–145)
Total Bilirubin: 0.8 mg/dL (ref <1.2)
Total Protein: 7.1 g/dL (ref 6.5–8.1)

## 2023-08-14 MED ORDER — HEPARIN SOD (PORK) LOCK FLUSH 100 UNIT/ML IV SOLN
500.0000 [IU] | Freq: Once | INTRAVENOUS | Status: AC | PRN
Start: 2023-08-14 — End: 2023-08-14
  Administered 2023-08-14: 500 [IU]

## 2023-08-14 MED ORDER — SODIUM CHLORIDE 0.9% FLUSH
10.0000 mL | INTRAVENOUS | Status: DC | PRN
  Administered 2023-08-14: 10 mL

## 2023-08-14 MED ORDER — SODIUM CHLORIDE 0.9 % IV SOLN
200.0000 mg | Freq: Once | INTRAVENOUS | Status: AC
  Administered 2023-08-14: 200 mg via INTRAVENOUS
  Filled 2023-08-14: qty 200

## 2023-08-14 MED ORDER — SODIUM CHLORIDE 0.9 % IV SOLN: Freq: Once | INTRAVENOUS | Status: AC

## 2023-08-14 MED ORDER — PROCHLORPERAZINE MALEATE 10 MG PO TABS
10.0000 mg | ORAL_TABLET | Freq: Once | ORAL | Status: AC
  Administered 2023-08-14: 10 mg via ORAL
  Filled 2023-08-14: qty 1

## 2023-08-14 MED ORDER — SODIUM CHLORIDE 0.9 % IV SOLN
500.0000 mg/m2 | Freq: Once | INTRAVENOUS | Status: AC
  Administered 2023-08-14: 1000 mg via INTRAVENOUS
  Filled 2023-08-14: qty 40

## 2023-08-14 MED ORDER — SODIUM CHLORIDE 0.9% FLUSH
10.0000 mL | Freq: Once | INTRAVENOUS | Status: AC
Start: 2023-08-14 — End: 2023-08-14
  Administered 2023-08-14: 10 mL

## 2023-08-14 NOTE — Patient Instructions (Signed)
[facility] - A DEPT OF [facility]  Discharge Instructions: Thank you for choosing [facility] to provide your oncology and hematology care.   If you have a lab appointment with the Cancer Center, please go directly to the Cancer Center and check in at the registration area.   Wear comfortable clothing and clothing appropriate for easy access to any Portacath or PICC line.   We strive to give you quality time with your provider. You may need to reschedule your appointment if you arrive late (15 or more minutes).  Arriving late affects you and other patients whose appointments are after yours.  Also, if you miss three or more appointments without notifying the office, you may be dismissed from the clinic at the provider's discretion.      For prescription refill requests, have your pharmacy contact our office and allow 72 hours for refills to be completed.    Today you received the following chemotherapy and/or immunotherapy agents: Keytruda, Alimta      To help prevent nausea and vomiting after your treatment, we encourage you to take your nausea medication as directed.  BELOW ARE SYMPTOMS THAT SHOULD BE REPORTED IMMEDIATELY: *FEVER GREATER THAN 100.4 F (38 C) OR HIGHER *CHILLS OR SWEATING *NAUSEA AND VOMITING THAT IS NOT CONTROLLED WITH YOUR NAUSEA MEDICATION *UNUSUAL SHORTNESS OF BREATH *UNUSUAL BRUISING OR BLEEDING *URINARY PROBLEMS (pain or burning when urinating, or frequent urination) *BOWEL PROBLEMS (unusual diarrhea, constipation, pain near the anus) TENDERNESS IN MOUTH AND THROAT WITH OR WITHOUT PRESENCE OF ULCERS (sore throat, sores in mouth, or a toothache) UNUSUAL RASH, SWELLING OR PAIN  UNUSUAL VAGINAL DISCHARGE OR ITCHING   Items with * indicate a potential emergency and should be followed up as soon as possible or go to the Emergency Department if any problems should occur.  Please show the CHEMOTHERAPY ALERT CARD or  IMMUNOTHERAPY ALERT CARD at check-in to the Emergency Department and triage nurse.  Should you have questions after your visit or need to cancel or reschedule your appointment, please contact [facility] - A DEPT OF [facility]  Dept: [phone]  and follow the prompts.  Office hours are 8:00 a.m. to 4:30 p.m. Monday - Friday. Please note that voicemails left after 4:00 p.m. may not be returned until the following business day.  We are closed weekends and major holidays. You have access to a nurse at all times for urgent questions. Please call the main number to the clinic Dept: [phone] and follow the prompts.   For any non-urgent questions, you may also contact your provider using MyChart. We now offer e-Visits for anyone 26 and older to request care online for non-urgent symptoms. For details visit [email or web address].   Also download the MyChart app! Go to the app store, search "MyChart", open the app, select [facility] [place], and log in with your MyChart username and password.

## 2023-08-20 ENCOUNTER — Other Ambulatory Visit (HOSPITAL_COMMUNITY): Payer: Self-pay

## 2023-08-22 ENCOUNTER — Other Ambulatory Visit (HOSPITAL_COMMUNITY): Payer: Self-pay

## 2023-08-22 ENCOUNTER — Other Ambulatory Visit: Payer: Self-pay | Admitting: Nurse Practitioner

## 2023-08-22 DIAGNOSIS — Z515 Encounter for palliative care: Secondary | ICD-10-CM

## 2023-08-22 DIAGNOSIS — C3490 Malignant neoplasm of unspecified part of unspecified bronchus or lung: Secondary | ICD-10-CM

## 2023-08-22 DIAGNOSIS — G893 Neoplasm related pain (acute) (chronic): Secondary | ICD-10-CM

## 2023-08-22 MED ORDER — OXYCODONE HCL ER 20 MG PO T12A
20.0000 mg | EXTENDED_RELEASE_TABLET | Freq: Two times a day (BID) | ORAL | 0 refills | Status: DC
  Filled 2023-08-22: qty 60, 30d supply, fill #0

## 2023-08-22 MED ORDER — OXYCODONE-ACETAMINOPHEN 7.5-325 MG PO TABS
1.0000 | ORAL_TABLET | Freq: Four times a day (QID) | ORAL | 0 refills | Status: DC | PRN
  Filled 2023-08-25 – 2023-08-27 (×2): qty 60, 15d supply, fill #0

## 2023-08-25 ENCOUNTER — Other Ambulatory Visit (HOSPITAL_COMMUNITY): Payer: Self-pay

## 2023-08-27 ENCOUNTER — Encounter: Payer: Self-pay | Admitting: Internal Medicine

## 2023-08-27 ENCOUNTER — Other Ambulatory Visit (HOSPITAL_COMMUNITY): Payer: Self-pay

## 2023-08-27 ENCOUNTER — Other Ambulatory Visit: Payer: Self-pay

## 2023-08-28 ENCOUNTER — Other Ambulatory Visit (HOSPITAL_COMMUNITY): Payer: Self-pay

## 2023-08-30 NOTE — Progress Notes (Signed)
[facility] OFFICE PROGRESS NOTE  Tracey Harries, MD [address] [place] [address]  DIAGNOSIS: 1) Stage IV (T3, N2, M1 C) non-small cell lung cancer, adenocarcinoma presented with large right upper lobe lung mass in addition to right hilar and subcarinal lymphadenopathy in addition to multiple metastatic bone lesions involving the pelvis as well as the lower thoracic and lumbar spines in addition to metastatic disease to the pelvic muscles diagnosed in February 2024.  2) deep venous thrombosis of the right lower extremity diagnosed on December 18, 2022   Detected Alteration(s) / Biomarker(s)         Associated FDA-approved therapies  Clinical Trial Availability% cfDNA or Amplification   KRAS G12C approved by FDA Adagrasib, Sotorasib Yes    29.3%   CDK4 Amplification None Yes            High (+++)Plasma Copy Number: 8.0   PD-L1 expression 98%  PRIOR THERAPY: Palliative radiotherapy to the painful bone metastasis under the care of Dr. Mitzi Hansen   CURRENT THERAPY: 1) Systemic chemotherapy with carboplatin for AUC of 5, Alimta 500 Mg/M2 and Keytruda 200 Mg IV every 3 weeks.  First dose November 14, 2022.  Status post 14 cycles.  Working from cycle #5 the patient has been on maintenance treatment with Alimta and Keytruda every 3 weeks. 2) Eliquis 10 mg p.o. twice daily for 7 days followed by 5 mg p.o. twice daily.  First dose 12/26/2022  INTERVAL HISTORY: Jaxin Granillo 57 y.o. male returns to the clinic today for a follow-up visit accompanied by his sister. The patient was last seen by myself 3 weeks ago.    He is currently on OxyContin 20 mg every 12 hours, oxycodone 7.5 mg 3x per day, Mobic, MiraLAX, and Colace. He is currently on Eliquis for history of DVT in the past. He sees palliative care. He is scheduled to see them today.    He states "everything is the same". He reports his fatigue is stable. He denies any fever or unexplained weight loss. His appetite is  about the same. He reports some stable dyspnea on exertion which is unchanged. Denies any chest pain or hemoptysis.  He has an intermittent chronic cough which he also states is the same. He continues to smoke cigarettes. He averages 1 ppd. He denies any significant nausea with treatment. Denies any vomiting, diarrhea, or constipation. Denies any headache or visual changes. Denies any rashes or skin changes except for dry skin.  He is here today for evaluation repeat blood work before undergoing cycle #15.   MEDICAL HISTORY: Past Medical History:  Diagnosis Date   CHF (congestive heart failure) (HCC)    COPD (chronic obstructive pulmonary disease) (HCC)    Diabetes mellitus without complication (HCC)    Dyspnea    Hypertension    Lung cancer (HCC) 10/2022   Right leg DVT (HCC) 12/21/2022    ALLERGIES:  is allergic to statins, aspirin, flexeril [cyclobenzaprine], and penicillins.  MEDICATIONS:  Current Outpatient Medications  Medication Sig Dispense Refill   Accu-Chek FastClix Lancets MISC Use to test blood sugar 4 times a day 102 each 0   albuterol (VENTOLIN HFA) 108 (90 Base) MCG/ACT inhaler Inhale 1-2 puffs into the lungs every 6 (six) hours as needed for wheezing or shortness of breath. 18 g 1   apixaban (ELIQUIS) 5 MG TABS tablet Take 1 tablet (5 mg total) by mouth 2 (two) times daily. 60 tablet 4   benzonatate (TESSALON) 100  MG capsule Take 1 capsule (100 mg total) by mouth 3 (three) times daily as needed. 30 capsule 2   Fluticasone-Umeclidin-Vilant (TRELEGY ELLIPTA) 100-62.5-25 MCG/ACT AEPB Inhale 1 puff into the lungs daily in the afternoon. 60 each 5   folic acid (FOLVITE) 1 MG tablet Take 1 tablet (1 mg total) by mouth daily. 90 tablet 1   furosemide (LASIX) 20 MG tablet Take 1 tablet (20 mg) by mouth daily as needed. 7 tablet 0   glimepiride (AMARYL) 2 MG tablet Take 2 mg by mouth in the morning.     lidocaine-prilocaine (EMLA) cream Apply to [facility] Prior to Access as needed 30 g  2   meloxicam (MOBIC) 7.5 MG tablet Take 1 tablet (7.5 mg total) by mouth 2 (two) times daily. 60 tablet 2   ondansetron (ZOFRAN) 8 MG tablet Take 1 tablet (8 mg) by mouth every 8 hours as needed for nausea or vomiting. 30 tablet 2   OVER THE COUNTER MEDICATION Take 1 tablet by mouth daily as needed (constipation). " Stool softener     oxyCODONE (OXYCONTIN) 20 mg 12 hr tablet Take 1 tablet (20 mg total) by mouth every 12 (twelve) hours. 60 tablet 0   oxyCODONE-acetaminophen (PERCOCET) 7.5-325 MG tablet Take 1 tablet by mouth every 6 (six) hours as needed for severe pain (pain score 7-10). 60 tablet 0   prochlorperazine (COMPAZINE) 10 MG tablet Take 1 tablet (10 mg total) by mouth every 6 (six) hours as needed for nausea or vomiting. 30 tablet 0   No current facility-administered medications for this visit.    SURGICAL HISTORY:  Past Surgical History:  Procedure Laterality Date   BRONCHIAL NEEDLE ASPIRATION BIOPSY  10/30/2022   Procedure: BRONCHIAL NEEDLE ASPIRATION BIOPSIES;  Surgeon: Leslye Peer, MD;  Location: [facility] ENDOSCOPY;  Service: Cardiopulmonary;;   IR IMAGING GUIDED PORT INSERTION  11/17/2022   NO PAST SURGERIES     RADIOLOGY WITH ANESTHESIA  10/30/2022   Procedure: MRI WITH ANESTHESIA W/WO CONTRAST;  Surgeon: Leslye Peer, MD;  Location: [facility] ENDOSCOPY;  Service: Cardiopulmonary;;   VIDEO BRONCHOSCOPY WITH ENDOBRONCHIAL ULTRASOUND Right 10/30/2022   Procedure: VIDEO BRONCHOSCOPY WITH ENDOBRONCHIAL ULTRASOUND;  Surgeon: Leslye Peer, MD;  Location: [facility] ENDOSCOPY;  Service: Cardiopulmonary;  Laterality: Right;    REVIEW OF SYSTEMS:   Constitutional: Positive for stable fatigue. Negative for appetite change, chills, fever and unexpected weight change.  HENT: Negative for mouth sores, nosebleeds, sore throat and trouble swallowing.   Eyes: Negative for eye problems and icterus.  Respiratory: Positive for baseline dyspnea on exertion and stable intermittent cough.  Negative for  hemoptysis and wheezing.     Cardiovascular: Negative for chest pain and leg swelling.  Gastrointestinal: Negative for abdominal pain, constipation, diarrhea, nausea and vomiting.  Genitourinary: Negative for bladder incontinence, difficulty urinating, dysuria, frequency and hematuria.   Musculoskeletal: Positive for cancer related bone pain (stable). Negative for gait problem, neck pain and neck stiffness.  Skin: Negative for itching and rash.  Neurological: Negative for dizziness, extremity weakness, gait problem, headaches, light-headedness and seizures.  Hematological: Negative for adenopathy. Does not bruise/bleed easily.  Psychiatric/Behavioral: Negative for confusion, depression and sleep disturbance. The patient is not nervous/anxious.   PHYSICAL EXAMINATION:  Blood pressure 124/69, pulse 68, temperature 98.2 F (36.8 C), temperature source Temporal, resp. rate 18, weight 200 lb 3.2 oz (90.8 kg), SpO2 90%.  ECOG PERFORMANCE STATUS: 1  Physical Exam  Constitutional: Oriented to person, place, and time and well-developed, well-nourished, and in no  distress.  HENT:  Head: Normocephalic and atraumatic.  Mouth/Throat: Oropharynx is clear and moist. No oropharyngeal exudate.  Eyes: Conjunctivae are normal. Right eye exhibits no discharge. Left eye exhibits no discharge. No scleral icterus.  Neck: Normal range of motion. Neck supple.  Cardiovascular: Normal rate, regular rhythm, normal heart sounds and intact distal pulses.   Pulmonary/Chest: Effort normal. Quiet breath sounds bilaterally. No respiratory distress. No wheezes. No rales.  Abdominal: Soft. Bowel sounds are normal. Exhibits no distension and no mass. There is no tenderness.  Musculoskeletal: Normal range of motion. Exhibits no significant lower extremity edema, just dry skin.  Lymphadenopathy:    No cervical adenopathy.  Neurological: Alert and oriented to person, place, and time. Exhibits muscle wasting. Examined in the  wheelchair.  Skin: Skin is warm and dry. No rash noted. Not diaphoretic. No erythema. No pallor.  Psychiatric: Mood, memory and judgment normal.  Vitals reviewed.  LABORATORY DATA: Lab Results  Component Value Date   WBC 5.7 09/04/2023   HGB 13.3 09/04/2023   HCT 42.0 09/04/2023   MCV 97.0 09/04/2023   PLT 175 09/04/2023      Chemistry      Component Value Date/Time   NA 141 08/14/2023 1047   K 4.0 08/14/2023 1047   CL 101 08/14/2023 1047   CO2 37 (H) 08/14/2023 1047   BUN 13 08/14/2023 1047   CREATININE 0.92 08/14/2023 1047      Component Value Date/Time   CALCIUM 9.5 08/14/2023 1047   ALKPHOS 72 08/14/2023 1047   AST 16 08/14/2023 1047   ALT 10 08/14/2023 1047   BILITOT 0.8 08/14/2023 1047       RADIOGRAPHIC STUDIES:  No results found.   ASSESSMENT/PLAN:  This is a very pleasant 57 years old Caucasian male with stage IV (T3, N2, M1 C) non-small cell lung cancer, adenocarcinoma presented with large right upper lobe lung mass in addition to right hilar and subcarinal lymphadenopathy in addition to multiple metastatic bone lesions involving the pelvis as well as the lower thoracic and lumbar spines in addition to metastatic disease to the pelvic muscles diagnosed in February 2024.  Molecular studies showed positive KRAS G12C mutation and PD-L1 expression of 98%.   The patient is here today to start the first cycle of systemic chemotherapy with carboplatin for AUC of 5, Alimta 500 Mg/M2 and Keytruda 200 Mg IV every 3 weeks.  First dose November 14, 2022.  Status post 14 cycles.  Starting from cycle #5 the patient is on maintenance treatment with Alimta and Keytruda every 3 weeks.   Labs were reviewed. Recommend that he proceed with cycle #15 today as scheduled.    We will see him back for follow-up visit in 3 weeks for evaluation repeat blood work before undergoing cycle #16.    Will continue on his blood thinner for his history of DVT.   He will continue to follow  with palliative care for his cancer related pain.  His pain is controlled at this time.  He is scheduled to see them today.   I will arrange for restaging CT scan of the chest, abdomen, pelvis prior to his next cycle of treatment.  The patient wear supplemental oxygen as needed but typically refuses to wear his oxygen while in the clinic.  He did get some paper in the mail regarding his oxygen tank.  They are wondering who to reach out to for this.  We would recommend that he reach out to his prescribing provider  which is his pulmonologist.  The patient was advised to call immediately if he has any concerning symptoms in the interval. The patient voices understanding of current disease status and treatment options and is in agreement with the current care plan. All questions were answered. The patient knows to call the clinic with any problems, questions or concerns. We can certainly see the patient much sooner if necessary      Orders Placed This Encounter  Procedures   CT CHEST ABDOMEN PELVIS W CONTRAST    Standing Status:   Future    Expected Date:   09/18/2023    Expiration Date:   09/03/2024    If indicated for the ordered procedure, I authorize the administration of contrast media per Radiology protocol:   Yes    Does the patient have a contrast media/X-ray dye allergy?:   No    Preferred imaging location?:   [facility]    If indicated for the ordered procedure, I authorize the administration of oral contrast media per Radiology protocol:   Yes    The total time spent in the appointment was 20-29 minutes  Dondra Rhett L Saniya Tranchina, PA-C 09/04/23

## 2023-08-31 NOTE — Progress Notes (Unsigned)
Palliative Medicine [facility]  Telephone:(336) [phone] Fax:(336) [phone]   Name: Maxwell Grant Date: 08/31/2023 MRN: [id number]  DOB: 12-03-1965  Patient Care Team: Tracey Harries, MD as PCP - General (Family Medicine)    INTERVAL HISTORY: Maxwell Grant is a 57 y.o. male with  oncologic medical history including new diagnosed non-small cell lung cancer (10/2022), as well as a history of COPD, diabetes mellitus, hypertension, dyslipidemia as well as history of osteomyelitis of the back.  Palliative ask to see for symptom and pain management and goals of care.     SOCIAL HISTORY:    Maxwell Grant reports that he has been smoking cigarettes. He has never used smokeless tobacco. He reports current alcohol use of about 1.0 standard drink of alcohol per week. He reports current drug use. Drug: Marijuana.  ADVANCE DIRECTIVES: none on file   CODE STATUS: Full Code  PAST MEDICAL HISTORY: Past Medical History:  Diagnosis Date   CHF (congestive heart failure) (HCC)    COPD (chronic obstructive pulmonary disease) (HCC)    Diabetes mellitus without complication (HCC)    Dyspnea    Hypertension    Lung cancer (HCC) 10/2022   Right leg DVT (HCC) 12/21/2022    ALLERGIES:  is allergic to statins, aspirin, flexeril [cyclobenzaprine], and penicillins.  MEDICATIONS:  Current Outpatient Medications  Medication Sig Dispense Refill   Accu-Chek FastClix Lancets MISC Use to test blood sugar 4 times a day 102 each 0   albuterol (VENTOLIN HFA) 108 (90 Base) MCG/ACT inhaler Inhale 1-2 puffs into the lungs every 6 (six) hours as needed for wheezing or shortness of breath. 18 g 1   apixaban (ELIQUIS) 5 MG TABS tablet Take 1 tablet (5 mg total) by mouth 2 (two) times daily. 60 tablet 4   benzonatate (TESSALON) 100 MG capsule Take 1 capsule (100 mg total) by mouth 3 (three) times daily as needed. 30 capsule 2   Fluticasone-Umeclidin-Vilant (TRELEGY ELLIPTA) 100-62.5-25 MCG/ACT  AEPB Inhale 1 puff into the lungs daily in the afternoon. 60 each 5   folic acid (FOLVITE) 1 MG tablet Take 1 tablet (1 mg total) by mouth daily. 90 tablet 1   furosemide (LASIX) 20 MG tablet Take 1 tablet (20 mg) by mouth daily as needed. 7 tablet 0   glimepiride (AMARYL) 2 MG tablet Take 2 mg by mouth in the morning.     lidocaine-prilocaine (EMLA) cream Apply to [facility] Prior to Access as needed 30 g 2   meloxicam (MOBIC) 7.5 MG tablet Take 1 tablet (7.5 mg total) by mouth 2 (two) times daily. 60 tablet 2   ondansetron (ZOFRAN) 8 MG tablet Take 1 tablet (8 mg) by mouth every 8 hours as needed for nausea or vomiting. 30 tablet 2   OVER THE COUNTER MEDICATION Take 1 tablet by mouth daily as needed (constipation). " Stool softener     oxyCODONE (OXYCONTIN) 20 mg 12 hr tablet Take 1 tablet (20 mg total) by mouth every 12 (twelve) hours. 60 tablet 0   oxyCODONE-acetaminophen (PERCOCET) 7.5-325 MG tablet Take 1 tablet by mouth every 6 (six) hours as needed for severe pain (pain score 7-10). 60 tablet 0   prochlorperazine (COMPAZINE) 10 MG tablet Take 1 tablet (10 mg total) by mouth every 6 (six) hours as needed for nausea or vomiting. 30 tablet 0   No current facility-administered medications for this visit.    VITAL SIGNS: There were no vitals taken for this visit. There were no vitals  filed for this visit.  Estimated body mass index is 31.76 kg/m as calculated from the following:   Height as of 07/03/23: 5\' 6"  (1.676 m).   Weight as of 08/14/23: 196 lb 12.8 oz (89.3 kg).   PERFORMANCE STATUS (ECOG) : 2 - Symptomatic, <50% confined to bed   Physical Exam General: NAD Cardiovascular: regular rate and rhythm Skin: no rashes Neurological: oriented x 4   Discussed the use of AI scribe software for clinical note transcription with the patient, who gave verbal consent to proceed.   IMPRESSION: I saw Maxwell Grant during infusion. Tolerating infusion without difficulty. He reports overall  well-being with no new health concerns since the last visit. He denies any issues with appetite, constipation, diarrhea, nausea, or vomiting. The patient continues to experience chronic pain, which he describes as "about the same." He manages this pain with medication, although he notes that he often runs out before his next refill is due. The patient acknowledges the need to give a few days' notice before his medication runs out to ensure a timely refill. Pain worsens with increased activity. He tries to listen to his body and take breaks to allow for recovery and over exhaustion.   Patient's pain medications consist of Oxycontin 20mg  every 12 hours and Percocet as needed for breakthrough. Meloxicam daily. He is taking medications as prescribed. Refills are appropriate. Maxwell Grant states pain is well managed overall. No adjustments made to regimen during his visit.   PLAN:  Chronic Cancer Related Pain Stable with current medication regimen. No adjustments to medications at this time.  -Continue current pain management regimen. -OxyContin 20 mg every 12 hours -Oxycodone 7.5/325 -15mg  every 6 hours as needed for breakthrough pain. Not requiring around the clock.  -Mobic 15 mg daily. -Miralax twice daily for bowel regimen  Follow-Up -Advise patient to call 2-3 days before running out of medication for refill. -Patient to return to clinic on December 17th for follow-up.  Patient expressed understanding and was in agreement with this plan. He also understands that He can call the clinic at any time with any questions, concerns, or complaints.    Any controlled substances utilized were prescribed in the context of palliative care. PDMP has been reviewed.   Visit consisted of counseling and education dealing with the complex and emotionally intense issues of symptom management and palliative care in the setting of serious and potentially life-threatening illness.  [name],  AGPCNP-BC  Palliative Medicine Team/[facility]  *Please note that this is a verbal dictation therefore any spelling or grammatical errors are due to the "Dragon Medical One" system interpretation.

## 2023-09-01 ENCOUNTER — Other Ambulatory Visit: Payer: Self-pay | Admitting: Nurse Practitioner

## 2023-09-01 DIAGNOSIS — C3490 Malignant neoplasm of unspecified part of unspecified bronchus or lung: Secondary | ICD-10-CM

## 2023-09-02 ENCOUNTER — Encounter: Payer: Self-pay | Admitting: Internal Medicine

## 2023-09-02 MED ORDER — FOLIC ACID 1 MG PO TABS
1.0000 mg | ORAL_TABLET | Freq: Every day | ORAL | 1 refills | Status: DC
  Filled 2023-09-02 – 2023-10-11 (×5): qty 90, 90d supply, fill #0
  Filled 2024-01-10: qty 90, 90d supply, fill #1

## 2023-09-03 ENCOUNTER — Other Ambulatory Visit (HOSPITAL_COMMUNITY): Payer: Self-pay

## 2023-09-04 ENCOUNTER — Inpatient Hospital Stay: Payer: Medicaid Other | Attending: Internal Medicine

## 2023-09-04 ENCOUNTER — Encounter: Payer: Self-pay | Admitting: Nurse Practitioner

## 2023-09-04 ENCOUNTER — Other Ambulatory Visit (HOSPITAL_COMMUNITY): Payer: Self-pay

## 2023-09-04 ENCOUNTER — Inpatient Hospital Stay (HOSPITAL_BASED_OUTPATIENT_CLINIC_OR_DEPARTMENT_OTHER): Payer: Medicaid Other | Admitting: Nurse Practitioner

## 2023-09-04 ENCOUNTER — Inpatient Hospital Stay (HOSPITAL_BASED_OUTPATIENT_CLINIC_OR_DEPARTMENT_OTHER): Payer: Medicaid Other | Admitting: Physician Assistant

## 2023-09-04 ENCOUNTER — Inpatient Hospital Stay: Payer: Medicaid Other

## 2023-09-04 VITALS — BP 124/69 | HR 68 | Temp 98.2°F | Resp 18 | Wt 200.2 lb

## 2023-09-04 DIAGNOSIS — Z888 Allergy status to other drugs, medicaments and biological substances status: Secondary | ICD-10-CM | POA: Insufficient documentation

## 2023-09-04 DIAGNOSIS — C778 Secondary and unspecified malignant neoplasm of lymph nodes of multiple regions: Secondary | ICD-10-CM | POA: Diagnosis not present

## 2023-09-04 DIAGNOSIS — G2581 Restless legs syndrome: Secondary | ICD-10-CM | POA: Diagnosis not present

## 2023-09-04 DIAGNOSIS — M25571 Pain in right ankle and joints of right foot: Secondary | ICD-10-CM | POA: Insufficient documentation

## 2023-09-04 DIAGNOSIS — Z5112 Encounter for antineoplastic immunotherapy: Secondary | ICD-10-CM

## 2023-09-04 DIAGNOSIS — M79671 Pain in right foot: Secondary | ICD-10-CM | POA: Diagnosis not present

## 2023-09-04 DIAGNOSIS — Z79899 Other long term (current) drug therapy: Secondary | ICD-10-CM | POA: Diagnosis not present

## 2023-09-04 DIAGNOSIS — Z5111 Encounter for antineoplastic chemotherapy: Secondary | ICD-10-CM | POA: Diagnosis present

## 2023-09-04 DIAGNOSIS — C3490 Malignant neoplasm of unspecified part of unspecified bronchus or lung: Secondary | ICD-10-CM | POA: Diagnosis not present

## 2023-09-04 DIAGNOSIS — Z880 Allergy status to penicillin: Secondary | ICD-10-CM | POA: Diagnosis not present

## 2023-09-04 DIAGNOSIS — Z7901 Long term (current) use of anticoagulants: Secondary | ICD-10-CM | POA: Diagnosis not present

## 2023-09-04 DIAGNOSIS — Z86718 Personal history of other venous thrombosis and embolism: Secondary | ICD-10-CM | POA: Insufficient documentation

## 2023-09-04 DIAGNOSIS — Z515 Encounter for palliative care: Secondary | ICD-10-CM

## 2023-09-04 DIAGNOSIS — K5903 Drug induced constipation: Secondary | ICD-10-CM

## 2023-09-04 DIAGNOSIS — M25572 Pain in left ankle and joints of left foot: Secondary | ICD-10-CM | POA: Diagnosis not present

## 2023-09-04 DIAGNOSIS — F17210 Nicotine dependence, cigarettes, uncomplicated: Secondary | ICD-10-CM | POA: Diagnosis not present

## 2023-09-04 DIAGNOSIS — M79672 Pain in left foot: Secondary | ICD-10-CM | POA: Insufficient documentation

## 2023-09-04 DIAGNOSIS — C3411 Malignant neoplasm of upper lobe, right bronchus or lung: Secondary | ICD-10-CM | POA: Insufficient documentation

## 2023-09-04 DIAGNOSIS — M792 Neuralgia and neuritis, unspecified: Secondary | ICD-10-CM | POA: Diagnosis not present

## 2023-09-04 DIAGNOSIS — C7951 Secondary malignant neoplasm of bone: Secondary | ICD-10-CM | POA: Diagnosis not present

## 2023-09-04 DIAGNOSIS — G893 Neoplasm related pain (acute) (chronic): Secondary | ICD-10-CM | POA: Diagnosis not present

## 2023-09-04 DIAGNOSIS — Z79631 Long term (current) use of antimetabolite agent: Secondary | ICD-10-CM | POA: Diagnosis not present

## 2023-09-04 DIAGNOSIS — Z95828 Presence of other vascular implants and grafts: Secondary | ICD-10-CM

## 2023-09-04 DIAGNOSIS — Z79620 Long term (current) use of immunosuppressive biologic: Secondary | ICD-10-CM | POA: Diagnosis not present

## 2023-09-04 DIAGNOSIS — J309 Allergic rhinitis, unspecified: Secondary | ICD-10-CM

## 2023-09-04 DIAGNOSIS — Z886 Allergy status to analgesic agent status: Secondary | ICD-10-CM | POA: Diagnosis not present

## 2023-09-04 DIAGNOSIS — E1169 Type 2 diabetes mellitus with other specified complication: Secondary | ICD-10-CM | POA: Insufficient documentation

## 2023-09-04 DIAGNOSIS — F1290 Cannabis use, unspecified, uncomplicated: Secondary | ICD-10-CM | POA: Diagnosis not present

## 2023-09-04 LAB — CMP (CANCER CENTER ONLY)
ALT: 10 U/L (ref 0–44)
AST: 13 U/L — ABNORMAL LOW (ref 15–41)
Albumin: 4 g/dL (ref 3.5–5.0)
Alkaline Phosphatase: 64 U/L (ref 38–126)
Anion gap: 4 — ABNORMAL LOW (ref 5–15)
BUN: 17 mg/dL (ref 6–20)
CO2: 38 mmol/L — ABNORMAL HIGH (ref 22–32)
Calcium: 9.5 mg/dL (ref 8.9–10.3)
Chloride: 99 mmol/L (ref 98–111)
Creatinine: 0.86 mg/dL (ref 0.61–1.24)
GFR, Estimated: >60 mL/min (ref >60)
Glucose, Bld: 93 mg/dL (ref 70–99)
Potassium: 4.3 mmol/L (ref 3.5–5.1)
Sodium: 141 mmol/L (ref 135–145)
Total Bilirubin: 0.7 mg/dL (ref <1.2)
Total Protein: 6.7 g/dL (ref 6.5–8.1)

## 2023-09-04 LAB — CBC WITH DIFFERENTIAL (CANCER CENTER ONLY)
Abs Immature Granulocytes: 0.01 10*3/uL (ref 0.00–0.07)
Basophils Absolute: 0.1 10*3/uL (ref 0.0–0.1)
Basophils Relative: 1 %
Eosinophils Absolute: 0.5 10*3/uL (ref 0.0–0.5)
Eosinophils Relative: 9 %
HCT: 42 % (ref 39.0–52.0)
Hemoglobin: 13.3 g/dL (ref 13.0–17.0)
Immature Granulocytes: 0 %
Lymphocytes Relative: 16 %
Lymphs Abs: 0.9 10*3/uL (ref 0.7–4.0)
MCH: 30.7 pg (ref 26.0–34.0)
MCHC: 31.7 g/dL (ref 30.0–36.0)
MCV: 97 fL (ref 80.0–100.0)
Monocytes Absolute: 0.5 10*3/uL (ref 0.1–1.0)
Monocytes Relative: 8 %
Neutro Abs: 3.8 10*3/uL (ref 1.7–7.7)
Neutrophils Relative %: 66 %
Platelet Count: 175 10*3/uL (ref 150–400)
RBC: 4.33 MIL/uL (ref 4.22–5.81)
RDW: 14.8 % (ref 11.5–15.5)
WBC Count: 5.7 10*3/uL (ref 4.0–10.5)
nRBC: 0 % (ref 0.0–0.2)

## 2023-09-04 LAB — TSH: TSH: 1.488 u[IU]/mL (ref 0.350–4.500)

## 2023-09-04 MED ORDER — SODIUM CHLORIDE 0.9 % IV SOLN
200.0000 mg | Freq: Once | INTRAVENOUS | Status: AC
  Administered 2023-09-04: 200 mg via INTRAVENOUS
  Filled 2023-09-04: qty 200

## 2023-09-04 MED ORDER — CYANOCOBALAMIN 1000 MCG/ML IJ SOLN
1000.0000 ug | Freq: Once | INTRAMUSCULAR | Status: AC
  Administered 2023-09-04: 1000 ug via INTRAMUSCULAR
  Filled 2023-09-04: qty 1

## 2023-09-04 MED ORDER — SODIUM CHLORIDE 0.9 % IV SOLN
Freq: Once | INTRAVENOUS | Status: AC
Start: 2023-09-04 — End: 2023-09-04

## 2023-09-04 MED ORDER — GABAPENTIN 300 MG PO CAPS
300.0000 mg | ORAL_CAPSULE | Freq: Every day | ORAL | 0 refills | Status: DC
  Filled 2023-09-04: qty 30, 30d supply, fill #0

## 2023-09-04 MED ORDER — SODIUM CHLORIDE 0.9% FLUSH
10.0000 mL | Freq: Once | INTRAVENOUS | Status: AC
  Administered 2023-09-04: 10 mL

## 2023-09-04 MED ORDER — FLUTICASONE PROPIONATE 50 MCG/ACT NA SUSP
1.0000 | Freq: Every day | NASAL | 2 refills | Status: DC
  Filled 2023-09-04: qty 16, 30d supply, fill #0
  Filled 2023-10-06: qty 16, 30d supply, fill #1
  Filled 2023-11-05: qty 16, 30d supply, fill #2

## 2023-09-04 MED ORDER — SODIUM CHLORIDE 0.9 % IV SOLN
500.0000 mg/m2 | Freq: Once | INTRAVENOUS | Status: AC
  Administered 2023-09-04: 1000 mg via INTRAVENOUS
  Filled 2023-09-04: qty 40

## 2023-09-04 MED ORDER — PROCHLORPERAZINE MALEATE 10 MG PO TABS
10.0000 mg | ORAL_TABLET | Freq: Once | ORAL | Status: AC
Start: 2023-09-04 — End: 2023-09-04
  Administered 2023-09-04: 10 mg via ORAL
  Filled 2023-09-04: qty 1

## 2023-09-05 ENCOUNTER — Encounter: Payer: Self-pay | Admitting: Internal Medicine

## 2023-09-05 ENCOUNTER — Other Ambulatory Visit (HOSPITAL_COMMUNITY): Payer: Self-pay

## 2023-09-05 LAB — T4: T4, Total: 7.8 ug/dL (ref 4.5–12.0)

## 2023-09-06 ENCOUNTER — Other Ambulatory Visit: Payer: Self-pay

## 2023-09-07 ENCOUNTER — Other Ambulatory Visit (HOSPITAL_COMMUNITY): Payer: Self-pay

## 2023-09-14 ENCOUNTER — Other Ambulatory Visit (HOSPITAL_COMMUNITY): Payer: Self-pay

## 2023-09-14 ENCOUNTER — Other Ambulatory Visit: Payer: Self-pay | Admitting: Nurse Practitioner

## 2023-09-14 DIAGNOSIS — M792 Neuralgia and neuritis, unspecified: Secondary | ICD-10-CM

## 2023-09-14 MED ORDER — GABAPENTIN 300 MG PO CAPS
300.0000 mg | ORAL_CAPSULE | Freq: Two times a day (BID) | ORAL | 2 refills | Status: DC
  Filled 2023-09-14 – 2023-09-15 (×2): qty 60, 30d supply, fill #0
  Filled 2023-10-11: qty 60, 30d supply, fill #1

## 2023-09-15 ENCOUNTER — Other Ambulatory Visit (HOSPITAL_BASED_OUTPATIENT_CLINIC_OR_DEPARTMENT_OTHER): Payer: Self-pay

## 2023-09-16 ENCOUNTER — Other Ambulatory Visit (HOSPITAL_COMMUNITY): Payer: Self-pay

## 2023-09-17 ENCOUNTER — Other Ambulatory Visit: Payer: Self-pay

## 2023-09-18 ENCOUNTER — Ambulatory Visit (HOSPITAL_COMMUNITY)
Admission: RE | Admit: 2023-09-18 | Discharge: 2023-09-18 | Disposition: A | Discharge status: Home / Self Care | Payer: Medicaid Other | Source: Ambulatory Visit | Attending: Physician Assistant | Admitting: Physician Assistant

## 2023-09-18 DIAGNOSIS — C3490 Malignant neoplasm of unspecified part of unspecified bronchus or lung: Secondary | ICD-10-CM | POA: Insufficient documentation

## 2023-09-18 MED ORDER — IOHEXOL 300 MG/ML  SOLN
100.0000 mL | Freq: Once | INTRAMUSCULAR | Status: AC | PRN
  Administered 2023-09-18: 100 mL via INTRAVENOUS

## 2023-09-18 MED ORDER — HEPARIN SOD (PORK) LOCK FLUSH 100 UNIT/ML IV SOLN
500.0000 [IU] | Freq: Once | INTRAVENOUS | Status: AC
  Administered 2023-09-18: 500 [IU] via INTRAVENOUS

## 2023-09-18 MED ORDER — HEPARIN SOD (PORK) LOCK FLUSH 100 UNIT/ML IV SOLN
INTRAVENOUS | Status: AC
  Filled 2023-09-18: qty 5

## 2023-09-20 ENCOUNTER — Other Ambulatory Visit: Payer: Self-pay | Admitting: Nurse Practitioner

## 2023-09-20 ENCOUNTER — Encounter (HOSPITAL_COMMUNITY): Payer: Self-pay

## 2023-09-20 ENCOUNTER — Other Ambulatory Visit (HOSPITAL_COMMUNITY): Payer: Self-pay

## 2023-09-20 DIAGNOSIS — C3490 Malignant neoplasm of unspecified part of unspecified bronchus or lung: Secondary | ICD-10-CM

## 2023-09-20 DIAGNOSIS — Z515 Encounter for palliative care: Secondary | ICD-10-CM

## 2023-09-20 DIAGNOSIS — G893 Neoplasm related pain (acute) (chronic): Secondary | ICD-10-CM

## 2023-09-20 MED ORDER — ALBUTEROL SULFATE HFA 108 (90 BASE) MCG/ACT IN AERS
2.0000 | INHALATION_SPRAY | Freq: Four times a day (QID) | RESPIRATORY_TRACT | 3 refills | Status: DC
  Filled 2023-09-20 (×2): qty 18, 30d supply, fill #0
  Filled 2023-10-15: qty 18, 30d supply, fill #1
  Filled 2023-11-18: qty 18, 30d supply, fill #2
  Filled 2023-11-29 – 2024-01-10 (×2): qty 18, 30d supply, fill #3
  Filled 2024-02-03: qty 18, 30d supply, fill #4
  Filled 2024-03-06: qty 18, 30d supply, fill #5
  Filled 2024-04-08: qty 18, 30d supply, fill #6
  Filled 2024-05-12: qty 18, 30d supply, fill #7
  Filled 2024-06-04: qty 18, 30d supply, fill #8
  Filled 2024-07-04: qty 18, 30d supply, fill #9
  Filled 2024-08-11 (×2): qty 18, 30d supply, fill #10
  Filled 2024-09-12: qty 6.7, 25d supply, fill #11

## 2023-09-20 MED ORDER — OXYCODONE HCL ER 20 MG PO T12A
20.0000 mg | EXTENDED_RELEASE_TABLET | Freq: Two times a day (BID) | ORAL | 0 refills | Status: DC
  Filled 2023-09-20: qty 60, 30d supply, fill #0

## 2023-09-20 MED ORDER — ALBUTEROL SULFATE (2.5 MG/3ML) 0.083% IN NEBU
3.0000 mL | INHALATION_SOLUTION | Freq: Four times a day (QID) | RESPIRATORY_TRACT | 3 refills | Status: DC
  Filled 2023-09-20: qty 360, 30d supply, fill #0
  Filled 2024-02-08: qty 360, 30d supply, fill #1
  Filled 2024-04-08: qty 360, 30d supply, fill #2
  Filled 2024-05-17: qty 360, 30d supply, fill #3

## 2023-09-20 MED ORDER — PREDNISONE 10 MG PO TABS
10.0000 mg | ORAL_TABLET | ORAL | 0 refills | Status: DC
  Filled 2023-09-20: qty 12, 6d supply, fill #0

## 2023-09-20 MED ORDER — TRELEGY ELLIPTA 100-62.5-25 MCG/ACT IN AEPB
1.0000 | INHALATION_SPRAY | Freq: Every day | RESPIRATORY_TRACT | 3 refills | Status: DC
  Filled 2023-09-20: qty 120, 60d supply, fill #0
  Filled 2023-09-20: qty 180, 90d supply, fill #0
  Filled 2023-10-20 – 2023-11-26 (×4): qty 180, 90d supply, fill #1
  Filled 2023-11-30: qty 120, 60d supply, fill #1
  Filled 2023-11-30: qty 60, 30d supply, fill #1
  Filled 2024-03-17: qty 60, 30d supply, fill #2
  Filled 2024-05-10: qty 60, 30d supply, fill #3
  Filled 2024-06-04: qty 60, 30d supply, fill #4
  Filled 2024-07-04: qty 60, 30d supply, fill #5
  Filled 2024-08-11 (×2): qty 60, 30d supply, fill #6
  Filled 2024-09-12: qty 60, 30d supply, fill #7

## 2023-09-25 ENCOUNTER — Other Ambulatory Visit: Payer: Self-pay | Admitting: Nurse Practitioner

## 2023-09-25 ENCOUNTER — Other Ambulatory Visit (HOSPITAL_COMMUNITY): Payer: Self-pay

## 2023-09-25 ENCOUNTER — Encounter: Payer: Self-pay | Admitting: Internal Medicine

## 2023-09-25 DIAGNOSIS — G893 Neoplasm related pain (acute) (chronic): Secondary | ICD-10-CM

## 2023-09-25 DIAGNOSIS — C3490 Malignant neoplasm of unspecified part of unspecified bronchus or lung: Secondary | ICD-10-CM

## 2023-09-25 DIAGNOSIS — Z515 Encounter for palliative care: Secondary | ICD-10-CM

## 2023-09-25 MED ORDER — OXYCODONE-ACETAMINOPHEN 7.5-325 MG PO TABS
1.0000 | ORAL_TABLET | Freq: Four times a day (QID) | ORAL | 0 refills | Status: DC | PRN
  Filled 2023-09-25: qty 60, 15d supply, fill #0

## 2023-09-25 NOTE — Progress Notes (Signed)
 Palliative Medicine [facility]  Telephone:(336) [phone] Fax:([phone]   Name: Maxwell Grant Date: 09/25/2023 MRN: [id number]  DOB: 03/10/1966  Patient Care Team: Pura Lenis, MD as PCP - General (Family Medicine)    INTERVAL HISTORY: Maxwell Grant is a 58 y.o. male with  oncologic medical history including new diagnosed non-small cell lung cancer (10/2022), as well as a history of COPD, diabetes mellitus, hypertension, dyslipidemia as well as history of osteomyelitis of the back.  Palliative ask to see for symptom and pain management and goals of care.     SOCIAL HISTORY:    Maxwell Grant reports that he has been smoking cigarettes. He has never used smokeless tobacco. He reports current alcohol use of about 1.0 standard drink of alcohol per week. He reports current drug use. Drug: Marijuana.  ADVANCE DIRECTIVES: none on file   CODE STATUS: Full Code  PAST MEDICAL HISTORY: Past Medical History:  Diagnosis Date   CHF (congestive heart failure) (HCC)    COPD (chronic obstructive pulmonary disease) (HCC)    Diabetes mellitus without complication (HCC)    Dyspnea    Hypertension    Lung cancer (HCC) 10/2022   Right leg DVT (HCC) 12/21/2022    ALLERGIES:  is allergic to statins, aspirin, flexeril [cyclobenzaprine], and penicillins.  MEDICATIONS:  Current Outpatient Medications  Medication Sig Dispense Refill   Accu-Chek FastClix Lancets MISC Use to test blood sugar 4 times a day 102 each 0   albuterol  (PROVENTIL ) (2.5 MG/3ML) 0.083% nebulizer solution Inhale 3 mL (2.5 mg total) by nebulization every 6 (six) hours as needed for wheezing. 360 mL 3   albuterol  (VENTOLIN  HFA) 108 (90 Base) MCG/ACT inhaler Inhale 1-2 puffs into the lungs every 6 (six) hours as needed for wheezing or shortness of breath. 18 g 1   albuterol  (VENTOLIN  HFA) 108 (90 Base) MCG/ACT inhaler Inhale 2 puffs every 6 (six) hours as needed for wheezing. 54 g 3   apixaban  (ELIQUIS ) 5  MG TABS tablet Take 1 tablet (5 mg total) by mouth 2 (two) times daily. 60 tablet 4   benzonatate  (TESSALON ) 100 MG capsule Take 1 capsule (100 mg total) by mouth 3 (three) times daily as needed. 30 capsule 2   fluticasone  (FLONASE ) 50 MCG/ACT nasal spray Place 1 spray into both nostrils daily. 16 g 2   Fluticasone -Umeclidin-Vilant (TRELEGY ELLIPTA ) 100-62.5-25 MCG/ACT AEPB Inhale 1 puff into the lungs daily in the afternoon. 60 each 5   folic acid  (FOLVITE ) 1 MG tablet Take 1 tablet (1 mg total) by mouth daily. 90 tablet 1   furosemide  (LASIX ) 20 MG tablet Take 1 tablet (20 mg) by mouth daily as needed. 7 tablet 0   gabapentin  (NEURONTIN ) 300 MG capsule Take 1 capsule (300 mg total) by mouth 2 (two) times daily. 60 capsule 2   glimepiride  (AMARYL ) 2 MG tablet Take 2 mg by mouth in the morning.     lidocaine -prilocaine  (EMLA ) cream Apply to [facility] Prior to Access as needed 30 g 2   meloxicam  (MOBIC ) 7.5 MG tablet Take 1 tablet (7.5 mg total) by mouth 2 (two) times daily. 60 tablet 2   ondansetron  (ZOFRAN ) 8 MG tablet Take 1 tablet (8 mg) by mouth every 8 hours as needed for nausea or vomiting. 30 tablet 2   OVER THE COUNTER MEDICATION Take 1 tablet by mouth daily as needed (constipation).  Stool softener     oxyCODONE  (OXYCONTIN ) 20 mg 12 hr tablet Take 1 tablet (20  mg total) by mouth every 12 (twelve) hours. 60 tablet 0   oxyCODONE -acetaminophen  (PERCOCET) 7.5-325 MG tablet Take 1 tablet by mouth every 6 (six) hours as needed for severe pain (pain score 7-10). 60 tablet 0   predniSONE  (DELTASONE ) 10 MG tablet Take 3 tablets by mouth daily for 2 days, THEN take 2 tablets daily for 2 days, THEN take 1 tablet daily for 2 days. 12 tablet 0   prochlorperazine  (COMPAZINE ) 10 MG tablet Take 1 tablet (10 mg total) by mouth every 6 (six) hours as needed for nausea or vomiting. 30 tablet 0   TRELEGY ELLIPTA  100-62.5-25 MCG/ACT AEPB Inhale 1 puff once daily. 180 each 3   No current facility-administered  medications for this visit.    VITAL SIGNS: There were no vitals taken for this visit. There were no vitals filed for this visit.  Estimated body mass index is 32.31 kg/m as calculated from the following:   Height as of 07/03/23: 5' 6 (1.676 m).   Weight as of 09/04/23: 200 lb 3.2 oz (90.8 kg).   PERFORMANCE STATUS (ECOG) : 2 - Symptomatic, <50% confined to bed   Physical Exam General: NAD Cardiovascular: regular rate and rhythm Skin: no rashes Neurological: oriented x 4   Discussed the use of AI scribe software for clinical note transcription with the patient, who gave verbal consent to proceed.   IMPRESSION:  Maxwell Grant presents to clinic for follow-up. No acute distress. Family present. He is doing well overall. Remaining active as possible. Denies nausea, vomiting, cosntipation, or diarrhea. Appetite is good.   The patient, on a regimen of gabapentin , reports a generally good health status with no issues concerning his medications. Feels this has improved his feelings of neuropathy. Symptoms have decreased. Pain is well controlled with oxycodone .  Recent visited with his Pulmonologist who prescribed a five-day course of prednisone . Following this treatment, the patient experienced a significant increase in energy levels, engaging in physically demanding activities such as woodcutting. However, he reports that these activities have led to some back discomfort. Despite this, the patient describes the overall week as good. Discussed not overdoing his activity and listening to his body.   No adjustments to regimen. PLAN:  Cancer Related Pain Stable on Gabapentin  and oxycodone , no changes needed. -Continue Gabapentin  300mg  twice daily as prescribed. -Continue Oxycontin  20mg  every 12 with Oxy IR 5mg  every 6-8 hours as needed for breakthrough pain.   Pulmonary Condition Recent visit to pulmonologist, prescribed a 5-day course of Prednisone . Noted increased energy and  activity. -No changes to current treatment plan.  Back Pain Noted after increased activity. -Advise to moderate activity to avoid exacerbating back pain.  General Health Maintenance Infusion scheduled. -Proceed with planned infusion per Oncology -I will plan to see patient back in 4-6 weeks.  Patient expressed understanding and was in agreement with this plan. He also understands that He can call the clinic at any time with any questions, concerns, or complaints.    Any controlled substances utilized were prescribed in the context of palliative care. PDMP has been reviewed.   Visit consisted of counseling and education dealing with the complex and emotionally intense issues of symptom management and palliative care in the setting of serious and potentially life-threatening illness.  Levon Borer, AGPCNP-BC  Palliative Medicine Team/[facility]

## 2023-09-26 ENCOUNTER — Other Ambulatory Visit (HOSPITAL_COMMUNITY): Payer: Self-pay

## 2023-09-27 ENCOUNTER — Inpatient Hospital Stay: Payer: Medicaid Other | Attending: Internal Medicine

## 2023-09-27 ENCOUNTER — Other Ambulatory Visit (HOSPITAL_COMMUNITY): Payer: Self-pay

## 2023-09-27 ENCOUNTER — Other Ambulatory Visit: Payer: Self-pay | Admitting: Internal Medicine

## 2023-09-27 ENCOUNTER — Inpatient Hospital Stay: Payer: Medicaid Other

## 2023-09-27 ENCOUNTER — Encounter: Payer: Self-pay | Admitting: Internal Medicine

## 2023-09-27 ENCOUNTER — Inpatient Hospital Stay (HOSPITAL_BASED_OUTPATIENT_CLINIC_OR_DEPARTMENT_OTHER): Payer: Medicaid Other | Admitting: Internal Medicine

## 2023-09-27 ENCOUNTER — Inpatient Hospital Stay (HOSPITAL_BASED_OUTPATIENT_CLINIC_OR_DEPARTMENT_OTHER): Payer: Medicaid Other | Admitting: Nurse Practitioner

## 2023-09-27 ENCOUNTER — Other Ambulatory Visit (HOSPITAL_BASED_OUTPATIENT_CLINIC_OR_DEPARTMENT_OTHER): Payer: Self-pay

## 2023-09-27 VITALS — BP 136/79 | HR 62 | Temp 98.3°F | Resp 17 | Ht 66.0 in | Wt 204.0 lb

## 2023-09-27 DIAGNOSIS — Z5111 Encounter for antineoplastic chemotherapy: Secondary | ICD-10-CM | POA: Diagnosis present

## 2023-09-27 DIAGNOSIS — J432 Centrilobular emphysema: Secondary | ICD-10-CM | POA: Diagnosis not present

## 2023-09-27 DIAGNOSIS — Z79899 Other long term (current) drug therapy: Secondary | ICD-10-CM | POA: Insufficient documentation

## 2023-09-27 DIAGNOSIS — R0609 Other forms of dyspnea: Secondary | ICD-10-CM | POA: Diagnosis not present

## 2023-09-27 DIAGNOSIS — Z515 Encounter for palliative care: Secondary | ICD-10-CM

## 2023-09-27 DIAGNOSIS — K5903 Drug induced constipation: Secondary | ICD-10-CM | POA: Diagnosis not present

## 2023-09-27 DIAGNOSIS — G893 Neoplasm related pain (acute) (chronic): Secondary | ICD-10-CM | POA: Insufficient documentation

## 2023-09-27 DIAGNOSIS — C3490 Malignant neoplasm of unspecified part of unspecified bronchus or lung: Secondary | ICD-10-CM

## 2023-09-27 DIAGNOSIS — N62 Hypertrophy of breast: Secondary | ICD-10-CM | POA: Insufficient documentation

## 2023-09-27 DIAGNOSIS — F17210 Nicotine dependence, cigarettes, uncomplicated: Secondary | ICD-10-CM | POA: Diagnosis not present

## 2023-09-27 DIAGNOSIS — E119 Type 2 diabetes mellitus without complications: Secondary | ICD-10-CM | POA: Insufficient documentation

## 2023-09-27 DIAGNOSIS — C3411 Malignant neoplasm of upper lobe, right bronchus or lung: Secondary | ICD-10-CM | POA: Diagnosis present

## 2023-09-27 DIAGNOSIS — Z7901 Long term (current) use of anticoagulants: Secondary | ICD-10-CM | POA: Insufficient documentation

## 2023-09-27 DIAGNOSIS — Z5112 Encounter for antineoplastic immunotherapy: Secondary | ICD-10-CM | POA: Diagnosis present

## 2023-09-27 DIAGNOSIS — C7951 Secondary malignant neoplasm of bone: Secondary | ICD-10-CM | POA: Insufficient documentation

## 2023-09-27 DIAGNOSIS — M2550 Pain in unspecified joint: Secondary | ICD-10-CM | POA: Diagnosis not present

## 2023-09-27 DIAGNOSIS — Z888 Allergy status to other drugs, medicaments and biological substances status: Secondary | ICD-10-CM | POA: Insufficient documentation

## 2023-09-27 DIAGNOSIS — R058 Other specified cough: Secondary | ICD-10-CM | POA: Diagnosis not present

## 2023-09-27 DIAGNOSIS — D649 Anemia, unspecified: Secondary | ICD-10-CM | POA: Diagnosis not present

## 2023-09-27 DIAGNOSIS — E785 Hyperlipidemia, unspecified: Secondary | ICD-10-CM | POA: Diagnosis not present

## 2023-09-27 DIAGNOSIS — Z86718 Personal history of other venous thrombosis and embolism: Secondary | ICD-10-CM | POA: Diagnosis not present

## 2023-09-27 DIAGNOSIS — Z95828 Presence of other vascular implants and grafts: Secondary | ICD-10-CM

## 2023-09-27 DIAGNOSIS — I110 Hypertensive heart disease with heart failure: Secondary | ICD-10-CM | POA: Diagnosis not present

## 2023-09-27 DIAGNOSIS — Z880 Allergy status to penicillin: Secondary | ICD-10-CM | POA: Insufficient documentation

## 2023-09-27 DIAGNOSIS — M792 Neuralgia and neuritis, unspecified: Secondary | ICD-10-CM

## 2023-09-27 DIAGNOSIS — R0602 Shortness of breath: Secondary | ICD-10-CM | POA: Diagnosis not present

## 2023-09-27 DIAGNOSIS — Z9226 Personal history of immune checkpoint inhibitor therapy: Secondary | ICD-10-CM | POA: Insufficient documentation

## 2023-09-27 DIAGNOSIS — I700 Atherosclerosis of aorta: Secondary | ICD-10-CM | POA: Insufficient documentation

## 2023-09-27 DIAGNOSIS — I509 Heart failure, unspecified: Secondary | ICD-10-CM | POA: Insufficient documentation

## 2023-09-27 DIAGNOSIS — I2510 Atherosclerotic heart disease of native coronary artery without angina pectoris: Secondary | ICD-10-CM | POA: Diagnosis not present

## 2023-09-27 DIAGNOSIS — F1290 Cannabis use, unspecified, uncomplicated: Secondary | ICD-10-CM | POA: Insufficient documentation

## 2023-09-27 DIAGNOSIS — Z886 Allergy status to analgesic agent status: Secondary | ICD-10-CM | POA: Insufficient documentation

## 2023-09-27 LAB — CBC WITH DIFFERENTIAL (CANCER CENTER ONLY)
Abs Immature Granulocytes: 0.01 10*3/uL (ref 0.00–0.07)
Basophils Absolute: 0.1 10*3/uL (ref 0.0–0.1)
Basophils Relative: 1 %
Eosinophils Absolute: 0.3 10*3/uL (ref 0.0–0.5)
Eosinophils Relative: 6 %
HCT: 42.6 % (ref 39.0–52.0)
Hemoglobin: 13.2 g/dL (ref 13.0–17.0)
Immature Granulocytes: 0 %
Lymphocytes Relative: 21 %
Lymphs Abs: 1.3 10*3/uL (ref 0.7–4.0)
MCH: 30.6 pg (ref 26.0–34.0)
MCHC: 31 g/dL (ref 30.0–36.0)
MCV: 98.8 fL (ref 80.0–100.0)
Monocytes Absolute: 0.5 10*3/uL (ref 0.1–1.0)
Monocytes Relative: 8 %
Neutro Abs: 3.8 10*3/uL (ref 1.7–7.7)
Neutrophils Relative %: 64 %
Platelet Count: 178 10*3/uL (ref 150–400)
RBC: 4.31 MIL/uL (ref 4.22–5.81)
RDW: 15.1 % (ref 11.5–15.5)
WBC Count: 5.9 10*3/uL (ref 4.0–10.5)
nRBC: 0 % (ref 0.0–0.2)

## 2023-09-27 LAB — CMP (CANCER CENTER ONLY)
ALT: 10 U/L (ref 0–44)
AST: 12 U/L — ABNORMAL LOW (ref 15–41)
Albumin: 4 g/dL (ref 3.5–5.0)
Alkaline Phosphatase: 59 U/L (ref 38–126)
Anion gap: 4 — ABNORMAL LOW (ref 5–15)
BUN: 22 mg/dL — ABNORMAL HIGH (ref 6–20)
CO2: 38 mmol/L — ABNORMAL HIGH (ref 22–32)
Calcium: 9.1 mg/dL (ref 8.9–10.3)
Chloride: 102 mmol/L (ref 98–111)
Creatinine: 1.06 mg/dL (ref 0.61–1.24)
GFR, Estimated: >60 mL/min (ref >60)
Glucose, Bld: 99 mg/dL (ref 70–99)
Potassium: 3.7 mmol/L (ref 3.5–5.1)
Sodium: 144 mmol/L (ref 135–145)
Total Bilirubin: 0.7 mg/dL (ref 0.0–1.2)
Total Protein: 6.6 g/dL (ref 6.5–8.1)

## 2023-09-27 MED ORDER — SODIUM CHLORIDE 0.9 % IV SOLN: Freq: Once | INTRAVENOUS | Status: AC

## 2023-09-27 MED ORDER — SODIUM CHLORIDE 0.9 % IV SOLN
500.0000 mg/m2 | Freq: Once | INTRAVENOUS | Status: AC
  Administered 2023-09-27: 1000 mg via INTRAVENOUS
  Filled 2023-09-27: qty 40

## 2023-09-27 MED ORDER — ZOLEDRONIC ACID 4 MG/100ML IV SOLN
4.0000 mg | Freq: Once | INTRAVENOUS | Status: AC
  Administered 2023-09-27: 4 mg via INTRAVENOUS
  Filled 2023-09-27: qty 100

## 2023-09-27 MED ORDER — PROCHLORPERAZINE MALEATE 10 MG PO TABS
10.0000 mg | ORAL_TABLET | Freq: Once | ORAL | Status: AC
  Administered 2023-09-27: 10 mg via ORAL
  Filled 2023-09-27: qty 1

## 2023-09-27 MED ORDER — SODIUM CHLORIDE 0.9 % IV SOLN
200.0000 mg | Freq: Once | INTRAVENOUS | Status: AC
  Administered 2023-09-27: 200 mg via INTRAVENOUS
  Filled 2023-09-27: qty 200

## 2023-09-27 MED ORDER — HEPARIN SOD (PORK) LOCK FLUSH 100 UNIT/ML IV SOLN
500.0000 [IU] | Freq: Once | INTRAVENOUS | Status: AC | PRN
Start: 2023-09-27 — End: 2023-09-27
  Administered 2023-09-27: 500 [IU]

## 2023-09-27 MED ORDER — SODIUM CHLORIDE 0.9% FLUSH
10.0000 mL | INTRAVENOUS | Status: DC | PRN
Start: 2023-09-27 — End: 2023-09-27
  Administered 2023-09-27: 10 mL

## 2023-09-27 NOTE — Progress Notes (Signed)
 [facility] Telephone:(336) [phone]   Fax:([phone]  OFFICE PROGRESS NOTE  Pura Lenis, MD [address] [place] [address]  DIAGNOSIS:  1) Stage IV (T3, N2, M1 C) non-small cell lung cancer, adenocarcinoma presented with large right upper lobe lung mass in addition to right hilar and subcarinal lymphadenopathy in addition to multiple metastatic bone lesions involving the pelvis as well as the lower thoracic and lumbar spines in addition to metastatic disease to the pelvic muscles diagnosed in February 2024.  2) deep venous thrombosis of the right lower extremity diagnosed on December 18, 2022  Detected Alteration(s) / Biomarker(s) Associated FDA-approved therapies Clinical Trial Availability% cfDNA or Amplification  KRAS G12C approved by FDA Adagrasib, Sotorasib Yes 29.3%  CDK4 Amplification None Yes High (+++)Plasma Copy Number: 8.0  PD-L1 expression 98%  PRIOR THERAPY: Palliative radiotherapy to the painful bone metastasis under the care of Dr. Dewey  CURRENT THERAPY:  1) Systemic chemotherapy with carboplatin  for AUC of 5, Alimta  500 Mg/M2 and Keytruda  200 Mg IV every 3 weeks.  First dose November 14, 2022.  Status post 15 cycles.  Starting from cycle #5 the patient is on maintenance treatment with Alimta  and Keytruda  every 3 weeks. 2) Eliquis  10 mg p.o. twice daily for 7 days followed by 5 mg p.o. twice daily.  First dose 12/26/2022.  INTERVAL HISTORY: Maxwell Grant 58 y.o. male returns to the clinic today for follow-up visit.Discussed the use of AI scribe software for clinical note transcription with the patient, who gave verbal consent to proceed.  History of Present Illness   The patient, a 58 year old with a history of adenocarcinoma diagnosed in February 2024, has been undergoing chemotherapy. The tumor was noted to contain a K-RAS G12C mutation. The patient has completed four cycles of carboplatin , Alimta , and  Keytruda .  Recently, the patient has been experiencing shortness of breath and has been coughing up an unspecified substance. To manage these symptoms, the patient was prescribed a five-day course of prednisone , which has reportedly helped significantly.  The patient has been under the care of a palliative care team and has been on Lomedia for an unspecified duration. The patient's recent scans of the chest, abdomen, and pelvis have shown stable disease with no new growth.          MEDICAL HISTORY: Past Medical History:  Diagnosis Date   CHF (congestive heart failure) (HCC)    COPD (chronic obstructive pulmonary disease) (HCC)    Diabetes mellitus without complication (HCC)    Dyspnea    Hypertension    Lung cancer (HCC) 10/2022   Right leg DVT (HCC) 12/21/2022    ALLERGIES:  is allergic to statins, aspirin, flexeril [cyclobenzaprine], and penicillins.  MEDICATIONS:  Current Outpatient Medications  Medication Sig Dispense Refill   Accu-Chek FastClix Lancets MISC Use to test blood sugar 4 times a day 102 each 0   albuterol  (PROVENTIL ) (2.5 MG/3ML) 0.083% nebulizer solution Inhale 3 mL (2.5 mg total) by nebulization every 6 (six) hours as needed for wheezing. 360 mL 3   albuterol  (VENTOLIN  HFA) 108 (90 Base) MCG/ACT inhaler Inhale 1-2 puffs into the lungs every 6 (six) hours as needed for wheezing or shortness of breath. 18 g 1   albuterol  (VENTOLIN  HFA) 108 (90 Base) MCG/ACT inhaler Inhale 2 puffs every 6 (six) hours as needed for wheezing. 54 g 3   apixaban  (ELIQUIS ) 5 MG TABS tablet Take 1 tablet (5 mg total) by mouth 2 (  two) times daily. 60 tablet 4   benzonatate  (TESSALON ) 100 MG capsule Take 1 capsule (100 mg total) by mouth 3 (three) times daily as needed. 30 capsule 2   fluticasone  (FLONASE ) 50 MCG/ACT nasal spray Place 1 spray into both nostrils daily. 16 g 2   Fluticasone -Umeclidin-Vilant (TRELEGY ELLIPTA ) 100-62.5-25 MCG/ACT AEPB Inhale 1 puff into the lungs daily in the  afternoon. 60 each 5   folic acid  (FOLVITE ) 1 MG tablet Take 1 tablet (1 mg total) by mouth daily. 90 tablet 1   furosemide  (LASIX ) 20 MG tablet Take 1 tablet (20 mg) by mouth daily as needed. 7 tablet 0   gabapentin  (NEURONTIN ) 300 MG capsule Take 1 capsule (300 mg total) by mouth 2 (two) times daily. 60 capsule 2   glimepiride  (AMARYL ) 2 MG tablet Take 2 mg by mouth in the morning.     lidocaine -prilocaine  (EMLA ) cream Apply to [facility] Prior to Access as needed 30 g 2   meloxicam  (MOBIC ) 7.5 MG tablet Take 1 tablet (7.5 mg total) by mouth 2 (two) times daily. 60 tablet 2   ondansetron  (ZOFRAN ) 8 MG tablet Take 1 tablet (8 mg) by mouth every 8 hours as needed for nausea or vomiting. 30 tablet 2   OVER THE COUNTER MEDICATION Take 1 tablet by mouth daily as needed (constipation).  Stool softener     oxyCODONE  (OXYCONTIN ) 20 mg 12 hr tablet Take 1 tablet (20 mg total) by mouth every 12 (twelve) hours. 60 tablet 0   oxyCODONE -acetaminophen  (PERCOCET) 7.5-325 MG tablet Take 1 tablet by mouth every 6 (six) hours as needed for severe pain (pain score 7-10). 60 tablet 0   predniSONE  (DELTASONE ) 10 MG tablet Take 3 tablets by mouth daily for 2 days, THEN take 2 tablets daily for 2 days, THEN take 1 tablet daily for 2 days. 12 tablet 0   prochlorperazine  (COMPAZINE ) 10 MG tablet Take 1 tablet (10 mg total) by mouth every 6 (six) hours as needed for nausea or vomiting. 30 tablet 0   TRELEGY ELLIPTA  100-62.5-25 MCG/ACT AEPB Inhale 1 puff once daily. 180 each 3   No current facility-administered medications for this visit.    SURGICAL HISTORY:  Past Surgical History:  Procedure Laterality Date   BRONCHIAL NEEDLE ASPIRATION BIOPSY  10/30/2022   Procedure: BRONCHIAL NEEDLE ASPIRATION BIOPSIES;  Surgeon: Shelah Lamar RAMAN, MD;  Location: [facility] ENDOSCOPY;  Service: Cardiopulmonary;;   IR IMAGING GUIDED PORT INSERTION  11/17/2022   NO PAST SURGERIES     RADIOLOGY WITH ANESTHESIA  10/30/2022   Procedure: MRI WITH  ANESTHESIA W/WO CONTRAST;  Surgeon: Shelah Lamar RAMAN, MD;  Location: [facility] ENDOSCOPY;  Service: Cardiopulmonary;;   VIDEO BRONCHOSCOPY WITH ENDOBRONCHIAL ULTRASOUND Right 10/30/2022   Procedure: VIDEO BRONCHOSCOPY WITH ENDOBRONCHIAL ULTRASOUND;  Surgeon: Shelah Lamar RAMAN, MD;  Location: [facility] ENDOSCOPY;  Service: Cardiopulmonary;  Laterality: Right;    REVIEW OF SYSTEMS:  Constitutional: positive for fatigue Eyes: negative Ears, nose, mouth, throat, and face: negative Respiratory: positive for cough and dyspnea on exertion Cardiovascular: negative Gastrointestinal: negative Genitourinary:negative Integument/breast: negative Hematologic/lymphatic: negative Musculoskeletal:positive for back pain Neurological: negative Behavioral/Psych: negative Endocrine: negative Allergic/Immunologic: negative   PHYSICAL EXAMINATION: General appearance: alert, cooperative, and no distress Head: Normocephalic, without obvious abnormality, atraumatic Neck: no adenopathy, no JVD, supple, symmetrical, trachea midline, and thyroid  not enlarged, symmetric, no tenderness/mass/nodules Lymph nodes: Cervical, supraclavicular, and axillary nodes normal. Resp: clear to auscultation bilaterally Back: symmetric, no curvature. ROM normal. No CVA tenderness. Cardio: regular rate and rhythm, S1,  S2 normal, no murmur, click, rub or gallop GI: soft, non-tender; bowel sounds normal; no masses,  no organomegaly Extremities: extremities normal, atraumatic, no cyanosis or edema Neurologic: Alert and oriented X 3, normal strength and tone. Normal symmetric reflexes. Normal coordination and gait  ECOG PERFORMANCE STATUS: 1 - Symptomatic but completely ambulatory  Blood pressure 136/79, pulse 62, temperature 98.3 F (36.8 C), temperature source Temporal, resp. rate 17, height 5' 6 (1.676 m), weight 204 lb (92.5 kg), SpO2 92%.  LABORATORY DATA: Lab Results  Component Value Date   WBC 5.9 09/27/2023   HGB 13.2 09/27/2023   HCT  42.6 09/27/2023   MCV 98.8 09/27/2023   PLT 178 09/27/2023      Chemistry      Component Value Date/Time   NA 141 09/04/2023 0915   K 4.3 09/04/2023 0915   CL 99 09/04/2023 0915   CO2 38 (H) 09/04/2023 0915   BUN 17 09/04/2023 0915   CREATININE 0.86 09/04/2023 0915      Component Value Date/Time   CALCIUM  9.5 09/04/2023 0915   ALKPHOS 64 09/04/2023 0915   AST 13 (L) 09/04/2023 0915   ALT 10 09/04/2023 0915   BILITOT 0.7 09/04/2023 0915       RADIOGRAPHIC STUDIES: CT CHEST ABDOMEN PELVIS W CONTRAST Result Date: 09/21/2023 CLINICAL DATA:  Metastatic lung cancer restaging * Tracking Code: BO * EXAM: CT CHEST, ABDOMEN, AND PELVIS WITH CONTRAST TECHNIQUE: Multidetector CT imaging of the chest, abdomen and pelvis was performed following the standard protocol during bolus administration of intravenous contrast. RADIATION DOSE REDUCTION: This exam was performed according to the departmental dose-optimization program which includes automated exposure control, adjustment of the mA and/or kV according to patient size and/or use of iterative reconstruction technique. CONTRAST:  OMNIPAQUE  IOHEXOL  300 MG/ML  SOLN COMPARISON:  07/17/2023 FINDINGS: CT CHEST FINDINGS Cardiovascular: Right chest port catheter. Aortic atherosclerosis. Normal heart size. Three-vessel coronary artery calcifications. No pericardial effusion. Mediastinum/Nodes: No enlarged mediastinal, hilar, or axillary lymph nodes. Thyroid  gland, trachea, and esophagus demonstrate no significant findings. Lungs/Pleura: Moderate centrilobular and paraseptal emphysema. Diffuse bilateral bronchial wall thickening. Bronchiolar plugging throughout the lung bases. Unchanged mass of the right upper lobe measuring 2.6 x 2.6 cm (series 6, image 44). No pleural effusion or pneumothorax. Musculoskeletal: Bilateral gynecomastia.  No acute osseous findings. CT ABDOMEN PELVIS FINDINGS Hepatobiliary: No solid liver abnormality is seen. No gallstones,  gallbladder wall thickening, or biliary dilatation. Pancreas: Unremarkable. No pancreatic ductal dilatation or surrounding inflammatory changes. Spleen: Normal in size without significant abnormality. Adrenals/Urinary Tract: Adrenal glands are unremarkable. Benign bilateral renal cortical cysts, for which no further follow-up or characterization is required. Kidneys are otherwise normal, without renal calculi, solid lesion, or hydronephrosis. Bladder is unremarkable. Stomach/Bowel: Stomach is within normal limits. Appendix appears normal. No evidence of bowel wall thickening, distention, or inflammatory changes. Vascular/Lymphatic: Severe aortic atherosclerosis. No enlarged abdominal or pelvic lymph nodes. Reproductive: No mass or other abnormality. Other: No abdominal wall hernia or abnormality. No ascites. Musculoskeletal: No acute osseous findings. Unchanged mixed lytic and sclerotic osseous metastatic disease involving the manubrium, multiple vertebral bodies, and the left ilium. Several unchanged nonacute pathologic fractures, including T8 vertebral body, the manubrium, and the left ilium (series 5, image 118, 159). IMPRESSION: 1. Unchanged treated mass of the right upper lobe. 2. Unchanged mixed lytic and sclerotic osseous metastatic disease involving the manubrium, multiple vertebral bodies, and the left ilium. Several unchanged nonacute pathologic fractures, including T8 vertebral body, the manubrium, and the left  ilium. 3. No evidence of lymphadenopathy or soft tissue metastatic disease in the chest, abdomen, or pelvis. 4. Emphysema and diffuse bilateral bronchial wall thickening. Bronchiolar plugging throughout the lung bases. 5. Coronary artery disease. Aortic Atherosclerosis (ICD10-I70.0) and Emphysema (ICD10-J43.9). Electronically Signed   By: Marolyn JONETTA Jaksch M.D.   On: 09/21/2023 22:24    ASSESSMENT AND PLAN: This is a very pleasant 58 years old white male with stage IV (T3, N2, M1 C) non-small cell  lung cancer, adenocarcinoma presented with large right upper lobe lung mass in addition to right hilar and subcarinal lymphadenopathy in addition to multiple metastatic bone lesions involving the pelvis as well as the lower thoracic and lumbar spines in addition to metastatic disease to the pelvic muscles diagnosed in February 2024.  Molecular studies showed positive KRAS G12C mutation and PD-L1 expression of 98%. The patient is here today to start the first cycle of systemic chemotherapy with carboplatin  for AUC of 5, Alimta  500 Mg/M2 and Keytruda  200 Mg IV every 3 weeks.  First dose November 14, 2022.  Status post 15 cycles.  Starting from cycle #5 the patient is on maintenance treatment with Alimta  and Keytruda  every 3 weeks. The patient has been tolerating this treatment well with no concerning adverse effect except for mild fatigue.    Adenocarcinoma of the Lung Adenocarcinoma of the lung with KRAS G12C mutation diagnosed in February 2024. Completed four cycles of carboplatin , pemetrexed  and Ketruda. Currently on Keytruda  and Alimta . Recent chest, abdomen, and pelvis scan shows no new growths. Shortness of breath and productive cough improved significantly with a five-day course of prednisone . Keytruda  can be continued for up to two years and Alimta  indefinitely as long as effective. - Continue Keytruda  and Alimta  - Provide a copy of the recent scan - Schedule follow-up in three weeks  Shortness of Breath Shortness of breath likely related to underlying lung cancer. Symptoms improved with a five-day course of prednisone . - Monitor respiratory symptoms   General Health Maintenance No specific issues discussed. - Encourage adherence to general health maintenance guidelines  Follow-up - Schedule follow-up in three weeks.   The patient was advised to call immediately if he has any other concerning symptoms in the interval. The patient voices understanding of current disease status and  treatment options and is in agreement with the current care plan.  All questions were answered. The patient knows to call the clinic with any problems, questions or concerns. We can certainly see the patient much sooner if necessary.  The total time spent in the appointment was 30 minutes.  Disclaimer: This note was dictated with voice recognition software. Similar sounding words can inadvertently be transcribed and may not be corrected upon review.

## 2023-09-27 NOTE — Patient Instructions (Signed)
 [facility]  Discharge Instructions: Thank you for choosing [facility] to provide your oncology and hematology care.   If you have a lab appointment with the Cancer Center, please go directly to the Cancer Center and check in at the registration area.   Wear comfortable clothing and clothing appropriate for easy access to any Portacath or PICC line.   We strive to give you quality time with your provider. You may need to reschedule your appointment if you arrive late (15 or more minutes).  Arriving late affects you and other patients whose appointments are after yours.  Also, if you miss three or more appointments without notifying the office, you may be dismissed from the clinic at the provider's discretion.      For prescription refill requests, have your pharmacy contact our office and allow 72 hours for refills to be completed.    Today you received the following chemotherapy and/or immunotherapy agents:  Keytruda, Alimta      To help prevent nausea and vomiting after your treatment, we encourage you to take your nausea medication as directed.  BELOW ARE SYMPTOMS THAT SHOULD BE REPORTED IMMEDIATELY: *FEVER GREATER THAN 100.4 F (38 C) OR HIGHER *CHILLS OR SWEATING *NAUSEA AND VOMITING THAT IS NOT CONTROLLED WITH YOUR NAUSEA MEDICATION *UNUSUAL SHORTNESS OF BREATH *UNUSUAL BRUISING OR BLEEDING *URINARY PROBLEMS (pain or burning when urinating, or frequent urination) *BOWEL PROBLEMS (unusual diarrhea, constipation, pain near the anus) TENDERNESS IN MOUTH AND THROAT WITH OR WITHOUT PRESENCE OF ULCERS (sore throat, sores in mouth, or a toothache) UNUSUAL RASH, SWELLING OR PAIN  UNUSUAL VAGINAL DISCHARGE OR ITCHING   Items with * indicate a potential emergency and should be followed up as soon as possible or go to the Emergency Department if any problems should occur.  Please show the CHEMOTHERAPY ALERT CARD or  IMMUNOTHERAPY ALERT CARD at check-in to the Emergency Department and triage nurse.  Should you have questions after your visit or need to cancel or reschedule your appointment, please contact [facility]  Dept: [phone]  and follow the prompts.  Office hours are 8:00 a.m. to 4:30 p.m. Monday - Friday. Please note that voicemails left after 4:00 p.m. may not be returned until the following business day.  We are closed weekends and major holidays. You have access to a nurse at all times for urgent questions. Please call the main number to the clinic Dept: [phone] and follow the prompts.   For any non-urgent questions, you may also contact your provider using MyChart. We now offer e-Visits for anyone 13 and older to request care online for non-urgent symptoms. For details visit [email or web address].   Also download the MyChart app! Go to the app store, search "MyChart", open the app, select [place], and log in with your MyChart username and password.  Zoledronic Acid Injection (Cancer) What is this medication? ZOLEDRONIC ACID (ZOE le dron ik AS id) treats high calcium levels in the blood caused by cancer. It may also be used with chemotherapy to treat weakened bones caused by cancer. It works by slowing down the release of calcium from bones. This lowers calcium levels in your blood. It also makes your bones stronger and less likely to break (fracture). It belongs to a group of medications called bisphosphonates. This medicine may be used for other purposes; ask your health  care provider or pharmacist if you have questions. COMMON BRAND NAME(S): Zometa, Zometa Powder What should I tell my care team before I take this medication? They need to know if you have any of these conditions: Dehydration Dental disease Kidney disease Liver disease Low levels of calcium in the blood Lung or breathing disease, such as asthma Receiving  steroids, such as dexamethasone or prednisone An unusual or allergic reaction to zoledronic acid, other medications, foods, dyes, or preservatives Pregnant or trying to get pregnant Breast-feeding How should I use this medication? This medication is injected into a vein. It is given by your care team in a hospital or clinic setting. Talk to your care team about the use of this medication in children. Special care may be needed. Overdosage: If you think you have taken too much of this medicine contact a poison control center or emergency room at once. NOTE: This medicine is only for you. Do not share this medicine with others. What if I miss a dose? Keep appointments for follow-up doses. It is important not to miss your dose. Call your care team if you are unable to keep an appointment. What may interact with this medication? Certain antibiotics given by injection Diuretics, such as bumetanide, furosemide NSAIDs, medications for pain and inflammation, such as ibuprofen or naproxen Teriparatide Thalidomide This list may not describe all possible interactions. Give your health care provider a list of all the medicines, herbs, non-prescription drugs, or dietary supplements you use. Also tell them if you smoke, drink alcohol, or use illegal drugs. Some items may interact with your medicine. What should I watch for while using this medication? Visit your care team for regular checks on your progress. It may be some time before you see the benefit from this medication. Some people who take this medication have severe bone, joint, or muscle pain. This medication may also increase your risk for jaw problems or a broken thigh bone. Tell your care team right away if you have severe pain in your jaw, bones, joints, or muscles. Tell you care team if you have any pain that does not go away or that gets worse. Tell your dentist and dental surgeon that you are taking this medication. You should not have major  dental surgery while on this medication. See your dentist to have a dental exam and fix any dental problems before starting this medication. Take good care of your teeth while on this medication. Make sure you see your dentist for regular follow-up appointments. You should make sure you get enough calcium and vitamin D while you are taking this medication. Discuss the foods you eat and the vitamins you take with your care team. Check with your care team if you have severe diarrhea, nausea, and vomiting, or if you sweat a lot. The loss of too much body fluid may make it dangerous for you to take this medication. You may need bloodwork while taking this medication. Talk to your care team if you wish to become pregnant or think you might be pregnant. This medication can cause serious birth defects. What side effects may I notice from receiving this medication? Side effects that you should report to your care team as soon as possible: Allergic reactions--skin rash, itching, hives, swelling of the face, lips, tongue, or throat Kidney injury--decrease in the amount of urine, swelling of the ankles, hands, or feet Low calcium level--muscle pain or cramps, confusion, tingling, or numbness in the hands or feet Osteonecrosis of the jaw--pain, swelling,  or redness in the mouth, numbness of the jaw, poor healing after dental work, unusual discharge from the mouth, visible bones in the mouth Severe bone, joint, or muscle pain Side effects that usually do not require medical attention (report to your care team if they continue or are bothersome): Constipation Fatigue Fever Loss of appetite Nausea Stomach pain This list may not describe all possible side effects. Call your doctor for medical advice about side effects. You may report side effects to FDA at 1-800-FDA-1088. Where should I keep my medication? This medication is given in a hospital or clinic. It will not be stored at home. NOTE: This sheet is a  summary. It may not cover all possible information. If you have questions about this medicine, talk to your doctor, pharmacist, or health care provider.  2024 Elsevier/Gold Standard (2021-10-28 00:00:00)

## 2023-09-29 ENCOUNTER — Encounter: Payer: Self-pay | Admitting: Nurse Practitioner

## 2023-09-29 ENCOUNTER — Encounter: Payer: Self-pay | Admitting: Internal Medicine

## 2023-10-06 ENCOUNTER — Other Ambulatory Visit (HOSPITAL_BASED_OUTPATIENT_CLINIC_OR_DEPARTMENT_OTHER): Payer: Self-pay | Admitting: Pulmonary Disease

## 2023-10-07 ENCOUNTER — Other Ambulatory Visit: Payer: Self-pay

## 2023-10-11 ENCOUNTER — Other Ambulatory Visit (HOSPITAL_COMMUNITY): Payer: Self-pay

## 2023-10-11 ENCOUNTER — Other Ambulatory Visit: Payer: Self-pay | Admitting: Nurse Practitioner

## 2023-10-11 ENCOUNTER — Other Ambulatory Visit: Payer: Self-pay

## 2023-10-11 DIAGNOSIS — Z515 Encounter for palliative care: Secondary | ICD-10-CM

## 2023-10-11 DIAGNOSIS — C3490 Malignant neoplasm of unspecified part of unspecified bronchus or lung: Secondary | ICD-10-CM

## 2023-10-11 DIAGNOSIS — M25552 Pain in left hip: Secondary | ICD-10-CM

## 2023-10-11 MED ORDER — MELOXICAM 7.5 MG PO TABS
7.5000 mg | ORAL_TABLET | Freq: Two times a day (BID) | ORAL | 3 refills | Status: DC
  Filled 2023-10-11: qty 60, 30d supply, fill #0
  Filled 2023-11-18: qty 60, 30d supply, fill #1
  Filled 2024-01-20: qty 60, 30d supply, fill #2
  Filled 2024-02-11 – 2024-02-12 (×2): qty 60, 30d supply, fill #3

## 2023-10-12 ENCOUNTER — Other Ambulatory Visit (HOSPITAL_COMMUNITY): Payer: Self-pay

## 2023-10-18 ENCOUNTER — Inpatient Hospital Stay: Payer: Medicaid Other

## 2023-10-18 ENCOUNTER — Inpatient Hospital Stay: Payer: Medicaid Other | Admitting: Internal Medicine

## 2023-10-18 VITALS — BP 131/69 | HR 65 | Temp 98.3°F | Resp 16 | Ht 66.0 in | Wt 205.1 lb

## 2023-10-18 DIAGNOSIS — C3490 Malignant neoplasm of unspecified part of unspecified bronchus or lung: Secondary | ICD-10-CM

## 2023-10-18 DIAGNOSIS — Z5112 Encounter for antineoplastic immunotherapy: Secondary | ICD-10-CM | POA: Diagnosis not present

## 2023-10-18 LAB — CBC WITH DIFFERENTIAL (CANCER CENTER ONLY)
Abs Immature Granulocytes: 0.01 10*3/uL (ref 0.00–0.07)
Basophils Absolute: 0 10*3/uL (ref 0.0–0.1)
Basophils Relative: 1 %
Eosinophils Absolute: 0.3 10*3/uL (ref 0.0–0.5)
Eosinophils Relative: 6 %
HCT: 39.9 % (ref 39.0–52.0)
Hemoglobin: 12.4 g/dL — ABNORMAL LOW (ref 13.0–17.0)
Immature Granulocytes: 0 %
Lymphocytes Relative: 16 %
Lymphs Abs: 0.9 10*3/uL (ref 0.7–4.0)
MCH: 30.4 pg (ref 26.0–34.0)
MCHC: 31.1 g/dL (ref 30.0–36.0)
MCV: 97.8 fL (ref 80.0–100.0)
Monocytes Absolute: 0.4 10*3/uL (ref 0.1–1.0)
Monocytes Relative: 7 %
Neutro Abs: 3.9 10*3/uL (ref 1.7–7.7)
Neutrophils Relative %: 70 %
Platelet Count: 189 10*3/uL (ref 150–400)
RBC: 4.08 MIL/uL — ABNORMAL LOW (ref 4.22–5.81)
RDW: 14.3 % (ref 11.5–15.5)
WBC Count: 5.5 10*3/uL (ref 4.0–10.5)
nRBC: 0 % (ref 0.0–0.2)

## 2023-10-18 LAB — CMP (CANCER CENTER ONLY)
ALT: 8 U/L (ref 0–44)
AST: 13 U/L — ABNORMAL LOW (ref 15–41)
Albumin: 3.8 g/dL (ref 3.5–5.0)
Alkaline Phosphatase: 66 U/L (ref 38–126)
Anion gap: 3 — ABNORMAL LOW (ref 5–15)
BUN: 17 mg/dL (ref 6–20)
CO2: 36 mmol/L — ABNORMAL HIGH (ref 22–32)
Calcium: 9.1 mg/dL (ref 8.9–10.3)
Chloride: 101 mmol/L (ref 98–111)
Creatinine: 0.88 mg/dL (ref 0.61–1.24)
GFR, Estimated: >60 mL/min (ref >60)
Glucose, Bld: 94 mg/dL (ref 70–99)
Potassium: 4.4 mmol/L (ref 3.5–5.1)
Sodium: 140 mmol/L (ref 135–145)
Total Bilirubin: 0.7 mg/dL (ref 0.0–1.2)
Total Protein: 6.8 g/dL (ref 6.5–8.1)

## 2023-10-18 MED ORDER — SODIUM CHLORIDE 0.9 % IV SOLN
500.0000 mg/m2 | Freq: Once | INTRAVENOUS | Status: AC
  Administered 2023-10-18: 1000 mg via INTRAVENOUS
  Filled 2023-10-18: qty 40

## 2023-10-18 MED ORDER — HEPARIN SOD (PORK) LOCK FLUSH 100 UNIT/ML IV SOLN
500.0000 [IU] | Freq: Once | INTRAVENOUS | Status: AC | PRN
  Administered 2023-10-18: 500 [IU]

## 2023-10-18 MED ORDER — SODIUM CHLORIDE 0.9 % IV SOLN
200.0000 mg | Freq: Once | INTRAVENOUS | Status: AC
  Administered 2023-10-18: 200 mg via INTRAVENOUS
  Filled 2023-10-18: qty 200

## 2023-10-18 MED ORDER — SODIUM CHLORIDE 0.9% FLUSH
10.0000 mL | INTRAVENOUS | Status: DC | PRN
  Administered 2023-10-18: 10 mL

## 2023-10-18 MED ORDER — SODIUM CHLORIDE 0.9 % IV SOLN: Freq: Once | INTRAVENOUS | Status: AC

## 2023-10-18 MED ORDER — PROCHLORPERAZINE MALEATE 10 MG PO TABS
10.0000 mg | ORAL_TABLET | Freq: Once | ORAL | Status: AC
Start: 2023-10-18 — End: 2023-10-18
  Administered 2023-10-18: 10 mg via ORAL
  Filled 2023-10-18: qty 1

## 2023-10-18 NOTE — Patient Instructions (Signed)
[facility]  Discharge Instructions: Thank you for choosing [facility] to provide your oncology and hematology care.   If you have a lab appointment with the Cancer Center, please go directly to the Cancer Center and check in at the registration area.   Wear comfortable clothing and clothing appropriate for easy access to any Portacath or PICC line.   We strive to give you quality time with your provider. You may need to reschedule your appointment if you arrive late (15 or more minutes).  Arriving late affects you and other patients whose appointments are after yours.  Also, if you miss three or more appointments without notifying the office, you may be dismissed from the clinic at the provider's discretion.      For prescription refill requests, have your pharmacy contact our office and allow 72 hours for refills to be completed.    Today you received the following chemotherapy and/or immunotherapy agents: Keytruda, Alimta.       To help prevent nausea and vomiting after your treatment, we encourage you to take your nausea medication as directed.  BELOW ARE SYMPTOMS THAT SHOULD BE REPORTED IMMEDIATELY: *FEVER GREATER THAN 100.4 F (38 C) OR HIGHER *CHILLS OR SWEATING *NAUSEA AND VOMITING THAT IS NOT CONTROLLED WITH YOUR NAUSEA MEDICATION *UNUSUAL SHORTNESS OF BREATH *UNUSUAL BRUISING OR BLEEDING *URINARY PROBLEMS (pain or burning when urinating, or frequent urination) *BOWEL PROBLEMS (unusual diarrhea, constipation, pain near the anus) TENDERNESS IN MOUTH AND THROAT WITH OR WITHOUT PRESENCE OF ULCERS (sore throat, sores in mouth, or a toothache) UNUSUAL RASH, SWELLING OR PAIN  UNUSUAL VAGINAL DISCHARGE OR ITCHING   Items with * indicate a potential emergency and should be followed up as soon as possible or go to the Emergency Department if any problems should occur.  Please show the CHEMOTHERAPY ALERT CARD or  IMMUNOTHERAPY ALERT CARD at check-in to the Emergency Department and triage nurse.  Should you have questions after your visit or need to cancel or reschedule your appointment, please contact [facility]  Dept: [phone]  and follow the prompts.  Office hours are 8:00 a.m. to 4:30 p.m. Monday - Friday. Please note that voicemails left after 4:00 p.m. may not be returned until the following business day.  We are closed weekends and major holidays. You have access to a nurse at all times for urgent questions. Please call the main number to the clinic Dept: [phone] and follow the prompts.   For any non-urgent questions, you may also contact your provider using MyChart. We now offer e-Visits for anyone 61 and older to request care online for non-urgent symptoms. For details visit [email or web address].   Also download the MyChart app! Go to the app store, search "MyChart", open the app, select , and log in with your MyChart username and password.

## 2023-10-18 NOTE — Progress Notes (Signed)
[facility] Telephone:([phone]   Fax:([phone]) [phone]  OFFICE PROGRESS NOTE  Tracey Harries, MD [address] [place] [address]  DIAGNOSIS:  1) Stage IV (T3, N2, M1 C) non-small cell lung cancer, adenocarcinoma presented with large right upper lobe lung mass in addition to right hilar and subcarinal lymphadenopathy in addition to multiple metastatic bone lesions involving the pelvis as well as the lower thoracic and lumbar spines in addition to metastatic disease to the pelvic muscles diagnosed in February 2024.  2) deep venous thrombosis of the right lower extremity diagnosed on December 18, 2022  Detected Alteration(s) / Biomarker(s) Associated FDA-approved therapies Clinical Trial Availability% cfDNA or Amplification  KRAS G12C approved by FDA Adagrasib, Sotorasib Yes 29.3%  CDK4 Amplification None Yes High (+++)Plasma Copy Number: 8.0  PD-L1 expression 98%  PRIOR THERAPY: Palliative radiotherapy to the painful bone metastasis under the care of Dr. Mitzi Hansen  CURRENT THERAPY:  1) Systemic chemotherapy with carboplatin for AUC of 5, Alimta 500 Mg/M2 and Keytruda 200 Mg IV every 3 weeks.  First dose November 14, 2022.  Status post 16 cycles.  Starting from cycle #5 the patient is on maintenance treatment with Alimta and Keytruda every 3 weeks. 2) Eliquis 10 mg p.o. twice daily for 7 days followed by 5 mg p.o. twice daily.  First dose 12/26/2022.  INTERVAL HISTORY: Maxwell Grant 58 y.o. male returns to the clinic today for follow-up visit.  Discussed the use of AI scribe software for clinical note transcription with the patient, who gave verbal consent to proceed.  History of Present Illness   The patient is a 58 year old with cancer who presents for ongoing chemotherapy treatment. He is accompanied by his sister.  The patient underwent treatment for cancer with carboplatin, Alimta, and Keytruda for four cycles. He is currently receiving Alimta and  Keytruda, having completed sixteen cycles, with today being the seventeenth cycle. The last imaging scan was conducted approximately three weeks ago, at the end of December.  He experiences shortness of breath and joint pain. No chest pain, hemoptysis, or production of greenish-yellow sputum. He occasionally coughs up phlegm but without blood.  He has gained some weight since the last visit, attributing this to being well-fed by his sister. He is taking supplements, including a multivitamin and iron, to address a slight drop in hemoglobin levels.         MEDICAL HISTORY: Past Medical History:  Diagnosis Date   CHF (congestive heart failure) (HCC)    COPD (chronic obstructive pulmonary disease) (HCC)    Diabetes mellitus without complication (HCC)    Dyspnea    Hypertension    Lung cancer (HCC) 10/2022   Right leg DVT (HCC) 12/21/2022    ALLERGIES:  is allergic to statins, aspirin, flexeril [cyclobenzaprine], and penicillins.  MEDICATIONS:  Current Outpatient Medications  Medication Sig Dispense Refill   Accu-Chek FastClix Lancets MISC Use to test blood sugar 4 times a day 102 each 0   albuterol (PROVENTIL) (2.5 MG/3ML) 0.083% nebulizer solution Inhale 3 mL (2.5 mg total) by nebulization every 6 (six) hours as needed for wheezing. 360 mL 3   albuterol (VENTOLIN HFA) 108 (90 Base) MCG/ACT inhaler Inhale 1-2 puffs into the lungs every 6 (six) hours as needed for wheezing or shortness of breath. 18 g 1   albuterol (VENTOLIN HFA) 108 (90 Base) MCG/ACT inhaler Inhale 2 puffs every 6 (six) hours as needed for wheezing. 54 g 3  apixaban (ELIQUIS) 5 MG TABS tablet Take 1 tablet (5 mg total) by mouth 2 (two) times daily. 60 tablet 4   benzonatate (TESSALON) 100 MG capsule Take 1 capsule (100 mg total) by mouth 3 (three) times daily as needed. 30 capsule 2   fluticasone (FLONASE) 50 MCG/ACT nasal spray Place 1 spray into both nostrils daily. 16 g 2   Fluticasone-Umeclidin-Vilant (TRELEGY  ELLIPTA) 100-62.5-25 MCG/ACT AEPB Inhale 1 puff into the lungs daily in the afternoon. 60 each 5   folic acid (FOLVITE) 1 MG tablet Take 1 tablet (1 mg total) by mouth daily. 90 tablet 1   furosemide (LASIX) 20 MG tablet Take 1 tablet (20 mg) by mouth daily as needed. 7 tablet 0   gabapentin (NEURONTIN) 300 MG capsule Take 1 capsule (300 mg total) by mouth 2 (two) times daily. 60 capsule 2   glimepiride (AMARYL) 2 MG tablet Take 2 mg by mouth in the morning.     lidocaine-prilocaine (EMLA) cream Apply to [facility] Prior to Access as needed 30 g 2   meloxicam (MOBIC) 7.5 MG tablet Take 1 tablet (7.5 mg total) by mouth 2 (two) times daily. 60 tablet 3   ondansetron (ZOFRAN) 8 MG tablet Take 1 tablet (8 mg) by mouth every 8 hours as needed for nausea or vomiting. 30 tablet 2   OVER THE COUNTER MEDICATION Take 1 tablet by mouth daily as needed (constipation). " Stool softener     oxyCODONE (OXYCONTIN) 20 mg 12 hr tablet Take 1 tablet (20 mg total) by mouth every 12 (twelve) hours. 60 tablet 0   oxyCODONE-acetaminophen (PERCOCET) 7.5-325 MG tablet Take 1 tablet by mouth every 6 (six) hours as needed for severe pain (pain score 7-10). 60 tablet 0   predniSONE (DELTASONE) 10 MG tablet Take 3 tablets by mouth daily for 2 days, THEN take 2 tablets daily for 2 days, THEN take 1 tablet daily for 2 days. 12 tablet 0   prochlorperazine (COMPAZINE) 10 MG tablet Take 1 tablet (10 mg total) by mouth every 6 (six) hours as needed for nausea or vomiting. 30 tablet 0   TRELEGY ELLIPTA 100-62.5-25 MCG/ACT AEPB Inhale 1 puff once daily. 180 each 3   No current facility-administered medications for this visit.    SURGICAL HISTORY:  Past Surgical History:  Procedure Laterality Date   BRONCHIAL NEEDLE ASPIRATION BIOPSY  10/30/2022   Procedure: BRONCHIAL NEEDLE ASPIRATION BIOPSIES;  Surgeon: Leslye Peer, MD;  Location: [facility] ENDOSCOPY;  Service: Cardiopulmonary;;   IR IMAGING GUIDED PORT INSERTION  11/17/2022   NO PAST  SURGERIES     RADIOLOGY WITH ANESTHESIA  10/30/2022   Procedure: MRI WITH ANESTHESIA W/WO CONTRAST;  Surgeon: Leslye Peer, MD;  Location: [facility] ENDOSCOPY;  Service: Cardiopulmonary;;   VIDEO BRONCHOSCOPY WITH ENDOBRONCHIAL ULTRASOUND Right 10/30/2022   Procedure: VIDEO BRONCHOSCOPY WITH ENDOBRONCHIAL ULTRASOUND;  Surgeon: Leslye Peer, MD;  Location: [facility] ENDOSCOPY;  Service: Cardiopulmonary;  Laterality: Right;    REVIEW OF SYSTEMS:  A comprehensive review of systems was negative except for: Respiratory: positive for cough and dyspnea on exertion   PHYSICAL EXAMINATION: General appearance: alert, cooperative, and no distress Head: Normocephalic, without obvious abnormality, atraumatic Neck: no adenopathy, no JVD, supple, symmetrical, trachea midline, and thyroid not enlarged, symmetric, no tenderness/mass/nodules Lymph nodes: Cervical, supraclavicular, and axillary nodes normal. Resp: clear to auscultation bilaterally Back: symmetric, no curvature. ROM normal. No CVA tenderness. Cardio: regular rate and rhythm, S1, S2 normal, no murmur, click, rub or gallop GI: soft,  non-tender; bowel sounds normal; no masses,  no organomegaly Extremities: extremities normal, atraumatic, no cyanosis or edema  ECOG PERFORMANCE STATUS: 1 - Symptomatic but completely ambulatory  Blood pressure 131/69, pulse 65, temperature 98.3 F (36.8 C), temperature source Temporal, resp. rate 16, height 5\' 6"  (1.676 m), weight 205 lb 1.6 oz (93 kg), SpO2 91%.  LABORATORY DATA: Lab Results  Component Value Date   WBC 5.5 10/18/2023   HGB 12.4 (L) 10/18/2023   HCT 39.9 10/18/2023   MCV 97.8 10/18/2023   PLT 189 10/18/2023      Chemistry      Component Value Date/Time   NA 144 09/27/2023 0824   K 3.7 09/27/2023 0824   CL 102 09/27/2023 0824   CO2 38 (H) 09/27/2023 0824   BUN 22 (H) 09/27/2023 0824   CREATININE 1.06 09/27/2023 0824      Component Value Date/Time   CALCIUM 9.1 09/27/2023 0824   ALKPHOS 59  09/27/2023 0824   AST 12 (L) 09/27/2023 0824   ALT 10 09/27/2023 0824   BILITOT 0.7 09/27/2023 0824       RADIOGRAPHIC STUDIES: No results found.   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  Molecular studies showed positive KRAS G12C mutation and PD-L1 expression of 98%. The patient is here today to start the first cycle of systemic chemotherapy with carboplatin for AUC of 5, Alimta 500 Mg/M2 and Keytruda 200 Mg IV every 3 weeks.  First dose November 14, 2022.  Status post 16 cycles.  Starting from cycle #5 the patient is on maintenance treatment with Alimta and Keytruda every 3 weeks. The patient has been tolerating this treatment fairly well.    Non-Small Cell Lung Cancer (NSCLC) Underwent treatment with carboplatin, pemetrexed (Alimta), and pembrolizumab (Keytruda) for four cycles. Currently on the seventeenth cycle of Alimta and Keytruda. Reports shortness of breath and joint pain but denies chest pain, hemoptysis, or productive cough. Hemoglobin decreased to 12.4 g/dL; other blood work remains within normal limits. Last scan three weeks ago; no new imaging required. Discussed the importance of continuing treatment and monitoring blood levels. Informed about the potential benefits of ongoing therapy and the need for dietary supplements to manage anemia. - Continue Alimta and Keytruda - Monitor hemoglobin levels and consider iron supplements or multivitamins - Encourage a diet rich in protein and iron - Schedule follow-up scans as per protocol  Anemia Hemoglobin decreased to 12.4 g/dL,  likely secondary to ongoing cancer treatment. Discussed dietary recommendations and supplements to support hemoglobin levels. - Recommend over-the-counter iron supplements every other day - Advise taking a multivitamin - Encourage a diet high in protein and iron  Follow-up - Continue current treatment regimen - Schedule follow-up scans as per protocol.   The patient was advised to call immediately if he has any other concerning symptoms in the interval. The patient voices understanding of current disease status and treatment options and is in agreement with the current care plan.  All questions were answered. The patient knows to call the clinic with any problems, questions or concerns. We can certainly see the patient much sooner if  necessary.  The total time spent in the appointment was 20 minutes.  Disclaimer: This note was dictated with voice recognition software. Similar sounding words can inadvertently be transcribed and may not be corrected upon review.

## 2023-10-20 ENCOUNTER — Other Ambulatory Visit: Payer: Self-pay | Admitting: Nurse Practitioner

## 2023-10-20 ENCOUNTER — Other Ambulatory Visit (HOSPITAL_COMMUNITY): Payer: Self-pay

## 2023-10-20 DIAGNOSIS — Z515 Encounter for palliative care: Secondary | ICD-10-CM

## 2023-10-20 DIAGNOSIS — G893 Neoplasm related pain (acute) (chronic): Secondary | ICD-10-CM

## 2023-10-20 DIAGNOSIS — C3490 Malignant neoplasm of unspecified part of unspecified bronchus or lung: Secondary | ICD-10-CM

## 2023-10-21 MED ORDER — OXYCODONE HCL ER 20 MG PO T12A
20.0000 mg | EXTENDED_RELEASE_TABLET | Freq: Two times a day (BID) | ORAL | 0 refills | Status: DC
  Filled 2023-10-21 – 2023-10-26 (×3): qty 60, 30d supply, fill #0

## 2023-10-22 ENCOUNTER — Encounter: Payer: Self-pay | Admitting: Internal Medicine

## 2023-10-22 ENCOUNTER — Other Ambulatory Visit (HOSPITAL_COMMUNITY): Payer: Self-pay

## 2023-10-26 ENCOUNTER — Other Ambulatory Visit (HOSPITAL_COMMUNITY): Payer: Self-pay

## 2023-10-26 ENCOUNTER — Encounter (HOSPITAL_COMMUNITY): Payer: Self-pay

## 2023-10-27 ENCOUNTER — Other Ambulatory Visit: Payer: Self-pay | Admitting: Nurse Practitioner

## 2023-10-27 DIAGNOSIS — C3490 Malignant neoplasm of unspecified part of unspecified bronchus or lung: Secondary | ICD-10-CM

## 2023-10-27 DIAGNOSIS — G893 Neoplasm related pain (acute) (chronic): Secondary | ICD-10-CM

## 2023-10-27 DIAGNOSIS — Z515 Encounter for palliative care: Secondary | ICD-10-CM

## 2023-10-28 ENCOUNTER — Encounter: Payer: Self-pay | Admitting: Internal Medicine

## 2023-10-28 ENCOUNTER — Other Ambulatory Visit (HOSPITAL_COMMUNITY): Payer: Self-pay

## 2023-10-28 MED ORDER — OXYCODONE-ACETAMINOPHEN 7.5-325 MG PO TABS
1.0000 | ORAL_TABLET | Freq: Four times a day (QID) | ORAL | 0 refills | Status: DC | PRN
  Filled 2023-10-28: qty 60, 15d supply, fill #0

## 2023-10-29 ENCOUNTER — Other Ambulatory Visit (HOSPITAL_COMMUNITY): Payer: Self-pay

## 2023-10-30 ENCOUNTER — Other Ambulatory Visit (HOSPITAL_COMMUNITY): Payer: Self-pay

## 2023-11-02 NOTE — Progress Notes (Signed)
 Palliative Medicine [facility]  Telephone:(336) [phone] Fax:(336) [phone]   Name: Maxwell Grant Date: 11/02/2023 MRN: [id number]  DOB: [birth year]  Patient Care Team: Tracey Harries, MD as PCP - General (Family Medicine)    INTERVAL HISTORY: Maxwell Grant is a 58 y.o. male with  oncologic medical history including new diagnosed non-small cell lung cancer (10/2022), as well as a history of COPD, diabetes mellitus, hypertension, dyslipidemia as well as history of osteomyelitis of the back.  Palliative ask to see for symptom and pain management and goals of care.     SOCIAL HISTORY:    Maxwell Grant reports that he has been smoking cigarettes. He has never used smokeless tobacco. He reports current alcohol use of about 1.0 standard drink of alcohol per week. He reports current drug use. Drug: Marijuana.  ADVANCE DIRECTIVES: none on file   CODE STATUS: Full Code  PAST MEDICAL HISTORY: Past Medical History:  Diagnosis Date   CHF (congestive heart failure) (HCC)    COPD (chronic obstructive pulmonary disease) (HCC)    Diabetes mellitus without complication (HCC)    Dyspnea    Hypertension    Lung cancer (HCC) 10/2022   Right leg DVT (HCC) 12/21/2022    ALLERGIES:  is allergic to statins, aspirin, flexeril [cyclobenzaprine], and penicillins.  MEDICATIONS:  Current Outpatient Medications  Medication Sig Dispense Refill   Accu-Chek FastClix Lancets MISC Use to test blood sugar 4 times a day 102 each 0   albuterol (PROVENTIL) (2.5 MG/3ML) 0.083% nebulizer solution Inhale 3 mL (2.5 mg total) by nebulization every 6 (six) hours as needed for wheezing. 360 mL 3   albuterol (VENTOLIN HFA) 108 (90 Base) MCG/ACT inhaler Inhale 1-2 puffs into the lungs every 6 (six) hours as needed for wheezing or shortness of breath. 18 g 1   albuterol (VENTOLIN HFA) 108 (90 Base) MCG/ACT inhaler Inhale 2 puffs every 6 (six) hours as needed for wheezing. 54 g 3   apixaban (ELIQUIS) 5  MG TABS tablet Take 1 tablet (5 mg total) by mouth 2 (two) times daily. 60 tablet 4   benzonatate (TESSALON) 100 MG capsule Take 1 capsule (100 mg total) by mouth 3 (three) times daily as needed. 30 capsule 2   fluticasone (FLONASE) 50 MCG/ACT nasal spray Place 1 spray into both nostrils daily. 16 g 2   Fluticasone-Umeclidin-Vilant (TRELEGY ELLIPTA) 100-62.5-25 MCG/ACT AEPB Inhale 1 puff into the lungs daily in the afternoon. 60 each 5   folic acid (FOLVITE) 1 MG tablet Take 1 tablet (1 mg total) by mouth daily. 90 tablet 1   furosemide (LASIX) 20 MG tablet Take 1 tablet (20 mg) by mouth daily as needed. 7 tablet 0   gabapentin (NEURONTIN) 300 MG capsule Take 1 capsule (300 mg total) by mouth 2 (two) times daily. 60 capsule 2   glimepiride (AMARYL) 2 MG tablet Take 2 mg by mouth in the morning.     lidocaine-prilocaine (EMLA) cream Apply to [facility] Prior to Access as needed 30 g 2   meloxicam (MOBIC) 7.5 MG tablet Take 1 tablet (7.5 mg total) by mouth 2 (two) times daily. 60 tablet 3   ondansetron (ZOFRAN) 8 MG tablet Take 1 tablet (8 mg) by mouth every 8 hours as needed for nausea or vomiting. 30 tablet 2   OVER THE COUNTER MEDICATION Take 1 tablet by mouth daily as needed (constipation). " Stool softener     oxyCODONE (OXYCONTIN) 20 mg 12 hr tablet Take 1 tablet (20  mg total) by mouth every 12 (twelve) hours. 60 tablet 0   oxyCODONE-acetaminophen (PERCOCET) 7.5-325 MG tablet Take 1 tablet by mouth every 6 (six) hours as needed for severe pain (pain score 7-10). 60 tablet 0   predniSONE (DELTASONE) 10 MG tablet Take 3 tablets by mouth daily for 2 days, THEN take 2 tablets daily for 2 days, THEN take 1 tablet daily for 2 days. 12 tablet 0   prochlorperazine (COMPAZINE) 10 MG tablet Take 1 tablet (10 mg total) by mouth every 6 (six) hours as needed for nausea or vomiting. 30 tablet 0   TRELEGY ELLIPTA 100-62.5-25 MCG/ACT AEPB Inhale 1 puff once daily. 180 each 3   No current facility-administered  medications for this visit.    VITAL SIGNS: There were no vitals taken for this visit. There were no vitals filed for this visit.  Estimated body mass index is 33.1 kg/m as calculated from the following:   Height as of 10/18/23: 5\' 6"  (1.676 m).   Weight as of 10/18/23: 205 lb 1.6 oz (93 kg).   PERFORMANCE STATUS (ECOG) : 2 - Symptomatic, <50% confined to bed   Physical Exam General: NAD Cardiovascular: regular rate and rhythm Skin: no rashes Neurological: oriented x 4   Discussed the use of AI scribe software for clinical note transcription with the patient, who gave verbal consent to proceed.   IMPRESSION:  [name] "Maxwell Grant" is a 58 year old male who presents to clinic for symptom management follow-up. No acute distress. Wife is present. His appetite is good, although his weight has decreased slightly from 205 lbs to 202 lbs, which he prefers as it helps him 'breathe a little better.' Denies nausea, vomiting, constipation, or diarrhea. Occasional fatigue however tries to remain as active as possible.   The patient reports pain is well controlled on current regimen which includes Oxycontin 20mg  every 12 hours and Percocet 7.5/325mg  every 6 hours as needed. He has been unable to obtain his prescribed OxyContin due to a prior authorization issue with his insurance. He has been able to pick up his other medications, including Percocet, but has been taking it more frequently due to the absence of OxyContin. Patient is aware we will follow-up with his insurance.   He continues to have neuropathic symptoms which included numbness and tingling. Currently taking gabapentin at a dose of 300 mg in the morning and at night. Initially, he noticed some relief but subsequently, it has not been effective in managing his symptoms, which he describes as feeling like 'needles, knives, and everything else.' Education provided on use and ways to manage symptoms. Will increase his gabapentin to 300mg   twice daily and 600mg  at bedtime. If further adjustments are required will titrate up to 600mg  three times daily. Patient and wife verbalized understanding.   All questions answered and support provided.  Assessment and Plan  Chronic Cancer Related Pain Persistent pain despite current regimen. Gabapentin not providing significant relief for neuropathic symptoms. Prior authorization needed for Oxycontin. - Increase Gabapentin to 300mg  in the morning, 300mg  in the afternoon, and 600mg  at bedtime. - Complete prior authorization for Oxycontin 20mg  every 12 hours. - Predate prescription for Percocet to be filled by 11/11/2023. -Will continue to closely monitor and adjust regimen as needed.   Insomnia Difficulty sleeping, possibly due to pain and anxiety. - Monitor response to increased Gabapentin dose at bedtime before considering additional interventions.  Follow-up in 2 weeks via phone call since provider will be out of office during  the next scheduled visit. Plan to see patient in person on 12/18/2023.  Patient expressed understanding and was in agreement with this plan. He also understands that He can call the clinic at any time with any questions, concerns, or complaints.    Any controlled substances utilized were prescribed in the context of palliative care. PDMP has been reviewed.   Visit consisted of counseling and education dealing with the complex and emotionally intense issues of symptom management and palliative care in the setting of serious and potentially life-threatening illness.  [name], AGPCNP-BC  Palliative Medicine Team/[facility]

## 2023-11-06 ENCOUNTER — Inpatient Hospital Stay (HOSPITAL_BASED_OUTPATIENT_CLINIC_OR_DEPARTMENT_OTHER): Payer: Medicaid Other | Admitting: Internal Medicine

## 2023-11-06 ENCOUNTER — Inpatient Hospital Stay: Payer: Medicaid Other | Attending: Internal Medicine

## 2023-11-06 ENCOUNTER — Other Ambulatory Visit (HOSPITAL_COMMUNITY): Payer: Self-pay

## 2023-11-06 ENCOUNTER — Inpatient Hospital Stay: Payer: Medicaid Other

## 2023-11-06 ENCOUNTER — Encounter: Payer: Self-pay | Admitting: Nurse Practitioner

## 2023-11-06 ENCOUNTER — Inpatient Hospital Stay (HOSPITAL_BASED_OUTPATIENT_CLINIC_OR_DEPARTMENT_OTHER): Payer: Medicaid Other | Admitting: Nurse Practitioner

## 2023-11-06 VITALS — BP 135/76 | HR 80 | Temp 98.7°F | Resp 17 | Ht 66.0 in | Wt 202.3 lb

## 2023-11-06 DIAGNOSIS — G893 Neoplasm related pain (acute) (chronic): Secondary | ICD-10-CM | POA: Diagnosis not present

## 2023-11-06 DIAGNOSIS — M792 Neuralgia and neuritis, unspecified: Secondary | ICD-10-CM

## 2023-11-06 DIAGNOSIS — C3490 Malignant neoplasm of unspecified part of unspecified bronchus or lung: Secondary | ICD-10-CM | POA: Diagnosis not present

## 2023-11-06 DIAGNOSIS — Z515 Encounter for palliative care: Secondary | ICD-10-CM | POA: Diagnosis not present

## 2023-11-06 DIAGNOSIS — Z7952 Long term (current) use of systemic steroids: Secondary | ICD-10-CM | POA: Insufficient documentation

## 2023-11-06 DIAGNOSIS — Z886 Allergy status to analgesic agent status: Secondary | ICD-10-CM | POA: Diagnosis not present

## 2023-11-06 DIAGNOSIS — J449 Chronic obstructive pulmonary disease, unspecified: Secondary | ICD-10-CM | POA: Insufficient documentation

## 2023-11-06 DIAGNOSIS — G4700 Insomnia, unspecified: Secondary | ICD-10-CM | POA: Diagnosis not present

## 2023-11-06 DIAGNOSIS — Z79899 Other long term (current) drug therapy: Secondary | ICD-10-CM | POA: Insufficient documentation

## 2023-11-06 DIAGNOSIS — C3411 Malignant neoplasm of upper lobe, right bronchus or lung: Secondary | ICD-10-CM | POA: Insufficient documentation

## 2023-11-06 DIAGNOSIS — Z888 Allergy status to other drugs, medicaments and biological substances status: Secondary | ICD-10-CM | POA: Diagnosis not present

## 2023-11-06 DIAGNOSIS — Z86718 Personal history of other venous thrombosis and embolism: Secondary | ICD-10-CM | POA: Insufficient documentation

## 2023-11-06 DIAGNOSIS — Z5112 Encounter for antineoplastic immunotherapy: Secondary | ICD-10-CM | POA: Insufficient documentation

## 2023-11-06 DIAGNOSIS — F17210 Nicotine dependence, cigarettes, uncomplicated: Secondary | ICD-10-CM | POA: Diagnosis not present

## 2023-11-06 DIAGNOSIS — Z880 Allergy status to penicillin: Secondary | ICD-10-CM | POA: Insufficient documentation

## 2023-11-06 DIAGNOSIS — Z79631 Long term (current) use of antimetabolite agent: Secondary | ICD-10-CM | POA: Diagnosis not present

## 2023-11-06 DIAGNOSIS — Z79620 Long term (current) use of immunosuppressive biologic: Secondary | ICD-10-CM | POA: Diagnosis not present

## 2023-11-06 DIAGNOSIS — R530 Neoplastic (malignant) related fatigue: Secondary | ICD-10-CM

## 2023-11-06 DIAGNOSIS — R630 Anorexia: Secondary | ICD-10-CM

## 2023-11-06 DIAGNOSIS — Z7901 Long term (current) use of anticoagulants: Secondary | ICD-10-CM | POA: Insufficient documentation

## 2023-11-06 DIAGNOSIS — C7951 Secondary malignant neoplasm of bone: Secondary | ICD-10-CM | POA: Insufficient documentation

## 2023-11-06 DIAGNOSIS — I10 Essential (primary) hypertension: Secondary | ICD-10-CM | POA: Insufficient documentation

## 2023-11-06 DIAGNOSIS — Z5111 Encounter for antineoplastic chemotherapy: Secondary | ICD-10-CM | POA: Insufficient documentation

## 2023-11-06 DIAGNOSIS — M549 Dorsalgia, unspecified: Secondary | ICD-10-CM | POA: Diagnosis not present

## 2023-11-06 DIAGNOSIS — Z95828 Presence of other vascular implants and grafts: Secondary | ICD-10-CM

## 2023-11-06 LAB — CBC WITH DIFFERENTIAL (CANCER CENTER ONLY)
Abs Immature Granulocytes: 0.01 10*3/uL (ref 0.00–0.07)
Basophils Absolute: 0 10*3/uL (ref 0.0–0.1)
Basophils Relative: 1 %
Eosinophils Absolute: 0.4 10*3/uL (ref 0.0–0.5)
Eosinophils Relative: 6 %
HCT: 41 % (ref 39.0–52.0)
Hemoglobin: 12.9 g/dL — ABNORMAL LOW (ref 13.0–17.0)
Immature Granulocytes: 0 %
Lymphocytes Relative: 14 %
Lymphs Abs: 0.9 10*3/uL (ref 0.7–4.0)
MCH: 30.6 pg (ref 26.0–34.0)
MCHC: 31.5 g/dL (ref 30.0–36.0)
MCV: 97.2 fL (ref 80.0–100.0)
Monocytes Absolute: 0.4 10*3/uL (ref 0.1–1.0)
Monocytes Relative: 6 %
Neutro Abs: 4.5 10*3/uL (ref 1.7–7.7)
Neutrophils Relative %: 73 %
Platelet Count: 138 10*3/uL — ABNORMAL LOW (ref 150–400)
RBC: 4.22 MIL/uL (ref 4.22–5.81)
RDW: 14.9 % (ref 11.5–15.5)
WBC Count: 6.2 10*3/uL (ref 4.0–10.5)
nRBC: 0 % (ref 0.0–0.2)

## 2023-11-06 LAB — CMP (CANCER CENTER ONLY)
ALT: 12 U/L (ref 0–44)
AST: 13 U/L — ABNORMAL LOW (ref 15–41)
Albumin: 4 g/dL (ref 3.5–5.0)
Alkaline Phosphatase: 72 U/L (ref 38–126)
Anion gap: 3 — ABNORMAL LOW (ref 5–15)
BUN: 17 mg/dL (ref 6–20)
CO2: 38 mmol/L — ABNORMAL HIGH (ref 22–32)
Calcium: 9.2 mg/dL (ref 8.9–10.3)
Chloride: 101 mmol/L (ref 98–111)
Creatinine: 0.92 mg/dL (ref 0.61–1.24)
GFR, Estimated: >60 mL/min (ref >60)
Glucose, Bld: 115 mg/dL — ABNORMAL HIGH (ref 70–99)
Potassium: 4 mmol/L (ref 3.5–5.1)
Sodium: 142 mmol/L (ref 135–145)
Total Bilirubin: 0.5 mg/dL (ref 0.0–1.2)
Total Protein: 6.6 g/dL (ref 6.5–8.1)

## 2023-11-06 LAB — TSH: TSH: 2.185 u[IU]/mL (ref 0.350–4.500)

## 2023-11-06 MED ORDER — GABAPENTIN 300 MG PO CAPS
ORAL_CAPSULE | ORAL | 2 refills | Status: DC
  Filled 2023-11-06: qty 150, 30d supply, fill #0
  Filled 2023-12-01: qty 150, 30d supply, fill #1
  Filled 2024-01-11: qty 150, 30d supply, fill #2

## 2023-11-06 MED ORDER — OXYCODONE-ACETAMINOPHEN 7.5-325 MG PO TABS
1.0000 | ORAL_TABLET | Freq: Four times a day (QID) | ORAL | 0 refills | Status: DC | PRN
  Filled 2023-11-10: qty 60, 15d supply, fill #0

## 2023-11-06 MED ORDER — CYANOCOBALAMIN 1000 MCG/ML IJ SOLN
1000.0000 ug | Freq: Once | INTRAMUSCULAR | Status: AC
  Administered 2023-11-06: 1000 ug via INTRAMUSCULAR
  Filled 2023-11-06: qty 1

## 2023-11-06 MED ORDER — SODIUM CHLORIDE 0.9 % IV SOLN
500.0000 mg/m2 | Freq: Once | INTRAVENOUS | Status: AC
  Administered 2023-11-06: 1000 mg via INTRAVENOUS
  Filled 2023-11-06: qty 40

## 2023-11-06 MED ORDER — SODIUM CHLORIDE 0.9% FLUSH
10.0000 mL | INTRAVENOUS | Status: DC | PRN
Start: 2023-11-06 — End: 2023-11-06
  Administered 2023-11-06: 10 mL

## 2023-11-06 MED ORDER — SODIUM CHLORIDE 0.9 % IV SOLN: Freq: Once | INTRAVENOUS | Status: AC

## 2023-11-06 MED ORDER — SODIUM CHLORIDE 0.9 % IV SOLN
200.0000 mg | Freq: Once | INTRAVENOUS | Status: AC
  Administered 2023-11-06: 200 mg via INTRAVENOUS
  Filled 2023-11-06: qty 200

## 2023-11-06 MED ORDER — SODIUM CHLORIDE 0.9% FLUSH
10.0000 mL | Freq: Once | INTRAVENOUS | Status: AC | PRN
  Administered 2023-11-06: 10 mL

## 2023-11-06 MED ORDER — PROCHLORPERAZINE MALEATE 10 MG PO TABS
10.0000 mg | ORAL_TABLET | Freq: Once | ORAL | Status: AC
  Administered 2023-11-06: 10 mg via ORAL
  Filled 2023-11-06: qty 1

## 2023-11-06 MED ORDER — HEPARIN SOD (PORK) LOCK FLUSH 100 UNIT/ML IV SOLN
500.0000 [IU] | Freq: Once | INTRAVENOUS | Status: AC | PRN
Start: 2023-11-06 — End: 2023-11-06
  Administered 2023-11-06: 500 [IU]

## 2023-11-06 NOTE — Progress Notes (Signed)
 [facility] Telephone:([phone]   Fax:(336) [phone]  OFFICE PROGRESS NOTE  Tracey Harries, MD [address] [place] [address]  DIAGNOSIS:  1) Stage IV (T3, N2, M1 C) non-small cell lung cancer, adenocarcinoma presented with large right upper lobe lung mass in addition to right hilar and subcarinal lymphadenopathy in addition to multiple metastatic bone lesions involving the pelvis as well as the lower thoracic and lumbar spines in addition to metastatic disease to the pelvic muscles diagnosed in February 2024.  2) deep venous thrombosis of the right lower extremity diagnosed on December 18, 2022  Detected Alteration(s) / Biomarker(s) Associated FDA-approved therapies Clinical Trial Availability% cfDNA or Amplification  KRAS G12C approved by FDA Adagrasib, Sotorasib Yes 29.3%  CDK4 Amplification None Yes High (+++)Plasma Copy Number: 8.0  PD-L1 expression 98%  PRIOR THERAPY: None  CURRENT THERAPY:  1) Systemic chemotherapy with carboplatin for AUC of 5, Alimta 500 Mg/M2 and Keytruda 200 Mg IV every 3 weeks.  First dose November 14, 2022.  Status post 17 cycles.  Starting from cycle #5 the patient is on maintenance treatment with Alimta and Keytruda every 3 weeks. 2) Eliquis 10 mg p.o. twice daily for 7 days followed by 5 mg p.o. twice daily.  First dose 12/26/2022.  INTERVAL HISTORY: Maxwell Grant 58 y.o. male returns to the clinic today for follow-up visit.  Discussed the use of AI scribe software for clinical note transcription with the patient, who gave verbal consent to proceed.  History of Present Illness   Maxwell Grant "Maxwell Grant" is a 58 year old male with lung cancer who presents with back pain exacerbated by walking. He is accompanied by his sister.  He has a history of lung cancer and is currently undergoing chemotherapy with carboplatin and pemetrexed, having completed seventeen cycles to date. He has not received radiation therapy to  the affected area, although he has been seen by the palliative care team.  He experiences back pain that worsens with walking, a persistent issue since the beginning of his treatment. He has difficulty breathing but no chest pain, nausea, or vomiting. His pain medication is no longer effective, and he has been without his long-acting pain medication for almost a month due to insurance requiring prior authorization.  He has a history of deep venous thrombosis in the right lower extremity and has been on Eliquis since April 2024. No new symptoms or complications related to DVT are reported.        MEDICAL HISTORY: Past Medical History:  Diagnosis Date   CHF (congestive heart failure) (HCC)    COPD (chronic obstructive pulmonary disease) (HCC)    Diabetes mellitus without complication (HCC)    Dyspnea    Hypertension    Lung cancer (HCC) 10/2022   Right leg DVT (HCC) 12/21/2022    ALLERGIES:  is allergic to statins, aspirin, flexeril [cyclobenzaprine], and penicillins.  MEDICATIONS:  Current Outpatient Medications  Medication Sig Dispense Refill   Accu-Chek FastClix Lancets MISC Use to test blood sugar 4 times a day 102 each 0   albuterol (PROVENTIL) (2.5 MG/3ML) 0.083% nebulizer solution Inhale 3 mL (2.5 mg total) by nebulization every 6 (six) hours as needed for wheezing. 360 mL 3   albuterol (VENTOLIN HFA) 108 (90 Base) MCG/ACT inhaler Inhale 1-2 puffs into the lungs every 6 (six) hours as needed for wheezing or shortness of breath. 18 g 1   albuterol (VENTOLIN HFA) 108 (90 Base) MCG/ACT inhaler  Inhale 2 puffs every 6 (six) hours as needed for wheezing. 54 g 3   apixaban (ELIQUIS) 5 MG TABS tablet Take 1 tablet (5 mg total) by mouth 2 (two) times daily. 60 tablet 4   benzonatate (TESSALON) 100 MG capsule Take 1 capsule (100 mg total) by mouth 3 (three) times daily as needed. 30 capsule 2   fluticasone (FLONASE) 50 MCG/ACT nasal spray Place 1 spray into both nostrils daily. 16 g 2    Fluticasone-Umeclidin-Vilant (TRELEGY ELLIPTA) 100-62.5-25 MCG/ACT AEPB Inhale 1 puff into the lungs daily in the afternoon. 60 each 5   folic acid (FOLVITE) 1 MG tablet Take 1 tablet (1 mg total) by mouth daily. 90 tablet 1   furosemide (LASIX) 20 MG tablet Take 1 tablet (20 mg) by mouth daily as needed. 7 tablet 0   gabapentin (NEURONTIN) 300 MG capsule Take 1 capsule (300 mg total) by mouth 2 (two) times daily. 60 capsule 2   glimepiride (AMARYL) 2 MG tablet Take 2 mg by mouth in the morning.     lidocaine-prilocaine (EMLA) cream Apply to [facility] Prior to Access as needed 30 g 2   meloxicam (MOBIC) 7.5 MG tablet Take 1 tablet (7.5 mg total) by mouth 2 (two) times daily. 60 tablet 3   ondansetron (ZOFRAN) 8 MG tablet Take 1 tablet (8 mg) by mouth every 8 hours as needed for nausea or vomiting. 30 tablet 2   OVER THE COUNTER MEDICATION Take 1 tablet by mouth daily as needed (constipation). " Stool softener     oxyCODONE (OXYCONTIN) 20 mg 12 hr tablet Take 1 tablet (20 mg total) by mouth every 12 (twelve) hours. 60 tablet 0   oxyCODONE-acetaminophen (PERCOCET) 7.5-325 MG tablet Take 1 tablet by mouth every 6 (six) hours as needed for severe pain (pain score 7-10). 60 tablet 0   predniSONE (DELTASONE) 10 MG tablet Take 3 tablets by mouth daily for 2 days, THEN take 2 tablets daily for 2 days, THEN take 1 tablet daily for 2 days. 12 tablet 0   prochlorperazine (COMPAZINE) 10 MG tablet Take 1 tablet (10 mg total) by mouth every 6 (six) hours as needed for nausea or vomiting. 30 tablet 0   TRELEGY ELLIPTA 100-62.5-25 MCG/ACT AEPB Inhale 1 puff once daily. 180 each 3   No current facility-administered medications for this visit.    SURGICAL HISTORY:  Past Surgical History:  Procedure Laterality Date   BRONCHIAL NEEDLE ASPIRATION BIOPSY  10/30/2022   Procedure: BRONCHIAL NEEDLE ASPIRATION BIOPSIES;  Surgeon: Leslye Peer, MD;  Location: [facility] ENDOSCOPY;  Service: Cardiopulmonary;;   IR IMAGING GUIDED  PORT INSERTION  11/17/2022   NO PAST SURGERIES     RADIOLOGY WITH ANESTHESIA  10/30/2022   Procedure: MRI WITH ANESTHESIA W/WO CONTRAST;  Surgeon: Leslye Peer, MD;  Location: [facility] ENDOSCOPY;  Service: Cardiopulmonary;;   VIDEO BRONCHOSCOPY WITH ENDOBRONCHIAL ULTRASOUND Right 10/30/2022   Procedure: VIDEO BRONCHOSCOPY WITH ENDOBRONCHIAL ULTRASOUND;  Surgeon: Leslye Peer, MD;  Location: [facility] ENDOSCOPY;  Service: Cardiopulmonary;  Laterality: Right;    REVIEW OF SYSTEMS:  A comprehensive review of systems was negative except for: Respiratory: positive for cough and dyspnea on exertion Musculoskeletal: positive for back pain   PHYSICAL EXAMINATION: General appearance: alert, cooperative, and no distress Head: Normocephalic, without obvious abnormality, atraumatic Neck: no adenopathy, no JVD, supple, symmetrical, trachea midline, and thyroid not enlarged, symmetric, no tenderness/mass/nodules Lymph nodes: Cervical, supraclavicular, and axillary nodes normal. Resp: clear to auscultation bilaterally Back: symmetric, no curvature.  ROM normal. No CVA tenderness. Cardio: regular rate and rhythm, S1, S2 normal, no murmur, click, rub or gallop GI: soft, non-tender; bowel sounds normal; no masses,  no organomegaly Extremities: extremities normal, atraumatic, no cyanosis or edema  ECOG PERFORMANCE STATUS: 1 - Symptomatic but completely ambulatory  Blood pressure 135/76, pulse 80, temperature 98.7 F (37.1 C), temperature source Temporal, resp. rate 17, height 5\' 6"  (1.676 m), weight 202 lb 4.8 oz (91.8 kg), SpO2 95%.  LABORATORY DATA: Lab Results  Component Value Date   WBC 6.2 11/06/2023   HGB 12.9 (L) 11/06/2023   HCT 41.0 11/06/2023   MCV 97.2 11/06/2023   PLT 138 (L) 11/06/2023      Chemistry      Component Value Date/Time   NA 142 11/06/2023 0833   K 4.0 11/06/2023 0833   CL 101 11/06/2023 0833   CO2 38 (H) 11/06/2023 0833   BUN 17 11/06/2023 0833   CREATININE 0.92 11/06/2023 0833       Component Value Date/Time   CALCIUM 9.2 11/06/2023 0833   ALKPHOS 72 11/06/2023 0833   AST 13 (L) 11/06/2023 0833   ALT 12 11/06/2023 0833   BILITOT 0.5 11/06/2023 0833       RADIOGRAPHIC STUDIES: CT OUTSIDE FILMS BODY/ABD/PELVIS Result Date: 10/25/2023 This examination belongs to an outside facility and is stored here for comparison purposes only.  Contact the originating outside institution for any associated report or interpretation.    ASSESSMENT AND PLAN: This is a very pleasant 58 years old white male with stage IV (T3, N2, M1 C) non-small cell lung cancer, adenocarcinoma presented with large right upper lobe lung mass in addition to right hilar and subcarinal lymphadenopathy in addition to multiple metastatic bone lesions involving the pelvis as well as the lower thoracic and lumbar spines in addition to metastatic disease to the pelvic muscles diagnosed in February 2024.  Molecular studies showed positive KRAS G12C mutation and PD-L1 expression of 98%. The patient is here today to start the first cycle of systemic chemotherapy with carboplatin for AUC of 5, Alimta 500 Mg/M2 and Keytruda 200 Mg IV every 3 weeks.  First dose November 14, 2022.  Status post 17 cycles.  Starting from cycle #5 the patient is on maintenance treatment with Alimta and Keytruda every 3 weeks. Has been tolerating this treatment fairly well.    Non-Small Cell Lung Cancer (NSCLC) 58 year old male with NSCLC undergoing chemotherapy with carboplatin, pemetrexed, and pembrolizumab initially for 4 cycles followed by maintenance treatment with Alimta and Keytruda every 3 weeks. Completed 17 cycles. Reports back pain exacerbated by walking, persistent issue. No radiation therapy to the affected area. Pain management challenging due to insurance requiring prior authorization for long-acting pain medication, without medication for almost a month. Discussed potential radiation therapy for pain relief if walking is  significantly affected. Lab results support proceeding with treatment today. - Proceed with chemotherapy treatment today - Order scan one week before the next visit - Refer to radiation oncologist for evaluation and potential radiation therapy to the back if it gets worse. - Discuss pain management options with palliative care nurse practitioner  Deep Vein Thrombosis (DVT) DVT in the right lower extremity, managed with Eliquis since April 2024. - Continue Eliquis as prescribed  Follow-up - Schedule scan one week before the next visit - Coordinate with palliative care team for pain management options.   The patient was advised to call immediately if he has any other concerning symptoms in the interval.  The patient voices understanding of current disease status and treatment options and is in agreement with the current care plan.  All questions were answered. The patient knows to call the clinic with any problems, questions or concerns. We can certainly see the patient much sooner if necessary.  The total time spent in the appointment was 20 minutes.  Disclaimer: This note was dictated with voice recognition software. Similar sounding words can inadvertently be transcribed and may not be corrected upon review.

## 2023-11-06 NOTE — Patient Instructions (Signed)
 [facility] - A DEPT OF [facility]  Discharge Instructions: Thank you for choosing [facility] to provide your oncology and hematology care.   If you have a lab appointment with the Cancer Center, please go directly to the Cancer Center and check in at the registration area.   Wear comfortable clothing and clothing appropriate for easy access to any Portacath or PICC line.   We strive to give you quality time with your provider. You may need to reschedule your appointment if you arrive late (15 or more minutes).  Arriving late affects you and other patients whose appointments are after yours.  Also, if you miss three or more appointments without notifying the office, you may be dismissed from the clinic at the provider's discretion.      For prescription refill requests, have your pharmacy contact our office and allow 72 hours for refills to be completed.    Today you received the following chemotherapy and/or immunotherapy agents: Keytruda/Alimta      To help prevent nausea and vomiting after your treatment, we encourage you to take your nausea medication as directed.  BELOW ARE SYMPTOMS THAT SHOULD BE REPORTED IMMEDIATELY: *FEVER GREATER THAN 100.4 F (38 C) OR HIGHER *CHILLS OR SWEATING *NAUSEA AND VOMITING THAT IS NOT CONTROLLED WITH YOUR NAUSEA MEDICATION *UNUSUAL SHORTNESS OF BREATH *UNUSUAL BRUISING OR BLEEDING *URINARY PROBLEMS (pain or burning when urinating, or frequent urination) *BOWEL PROBLEMS (unusual diarrhea, constipation, pain near the anus) TENDERNESS IN MOUTH AND THROAT WITH OR WITHOUT PRESENCE OF ULCERS (sore throat, sores in mouth, or a toothache) UNUSUAL RASH, SWELLING OR PAIN  UNUSUAL VAGINAL DISCHARGE OR ITCHING   Items with * indicate a potential emergency and should be followed up as soon as possible or go to the Emergency Department if any problems should occur.  Please show the CHEMOTHERAPY ALERT CARD or  IMMUNOTHERAPY ALERT CARD at check-in to the Emergency Department and triage nurse.  Should you have questions after your visit or need to cancel or reschedule your appointment, please contact [facility] - A DEPT OF [facility]  Dept: [phone]  and follow the prompts.  Office hours are 8:00 a.m. to 4:30 p.m. Monday - Friday. Please note that voicemails left after 4:00 p.m. may not be returned until the following business day.  We are closed weekends and major holidays. You have access to a nurse at all times for urgent questions. Please call the main number to the clinic Dept: [phone] and follow the prompts.   For any non-urgent questions, you may also contact your provider using MyChart. We now offer e-Visits for anyone 6 and older to request care online for non-urgent symptoms. For details visit [email or web address].   Also download the MyChart app! Go to the app store, search "MyChart", open the app, select [place], and log in with your MyChart username and password.

## 2023-11-10 ENCOUNTER — Encounter: Payer: Self-pay | Admitting: Internal Medicine

## 2023-11-10 ENCOUNTER — Other Ambulatory Visit (HOSPITAL_COMMUNITY): Payer: Self-pay

## 2023-11-10 NOTE — Progress Notes (Signed)
 Updated pemetrexed ERX to [id number]   Pryor Ochoa, PharmD 11/10/23

## 2023-11-12 ENCOUNTER — Other Ambulatory Visit: Payer: Self-pay | Admitting: Medical Oncology

## 2023-11-12 ENCOUNTER — Other Ambulatory Visit: Payer: Self-pay

## 2023-11-12 DIAGNOSIS — R530 Neoplastic (malignant) related fatigue: Secondary | ICD-10-CM

## 2023-11-12 DIAGNOSIS — Z5112 Encounter for antineoplastic immunotherapy: Secondary | ICD-10-CM

## 2023-11-12 LAB — T4

## 2023-11-12 NOTE — Progress Notes (Signed)
 Reordered T 4 to to NSQ.

## 2023-11-13 ENCOUNTER — Other Ambulatory Visit (HOSPITAL_COMMUNITY): Payer: Self-pay

## 2023-11-20 ENCOUNTER — Inpatient Hospital Stay: Payer: Medicaid Other | Attending: Internal Medicine

## 2023-11-20 ENCOUNTER — Ambulatory Visit (HOSPITAL_COMMUNITY)
Admission: RE | Admit: 2023-11-20 | Discharge: 2023-11-20 | Disposition: A | Discharge status: Home / Self Care | Payer: Medicaid Other | Source: Ambulatory Visit | Attending: Internal Medicine | Admitting: Internal Medicine

## 2023-11-20 ENCOUNTER — Other Ambulatory Visit (HOSPITAL_COMMUNITY): Payer: Self-pay

## 2023-11-20 ENCOUNTER — Other Ambulatory Visit: Payer: Medicaid Other

## 2023-11-20 ENCOUNTER — Other Ambulatory Visit: Payer: Self-pay | Admitting: Internal Medicine

## 2023-11-20 DIAGNOSIS — Z86718 Personal history of other venous thrombosis and embolism: Secondary | ICD-10-CM | POA: Insufficient documentation

## 2023-11-20 DIAGNOSIS — C7951 Secondary malignant neoplasm of bone: Secondary | ICD-10-CM | POA: Insufficient documentation

## 2023-11-20 DIAGNOSIS — R0989 Other specified symptoms and signs involving the circulatory and respiratory systems: Secondary | ICD-10-CM | POA: Insufficient documentation

## 2023-11-20 DIAGNOSIS — Z886 Allergy status to analgesic agent status: Secondary | ICD-10-CM | POA: Insufficient documentation

## 2023-11-20 DIAGNOSIS — I700 Atherosclerosis of aorta: Secondary | ICD-10-CM | POA: Insufficient documentation

## 2023-11-20 DIAGNOSIS — Z79620 Long term (current) use of immunosuppressive biologic: Secondary | ICD-10-CM | POA: Diagnosis not present

## 2023-11-20 DIAGNOSIS — C3490 Malignant neoplasm of unspecified part of unspecified bronchus or lung: Secondary | ICD-10-CM | POA: Insufficient documentation

## 2023-11-20 DIAGNOSIS — Z79899 Other long term (current) drug therapy: Secondary | ICD-10-CM | POA: Insufficient documentation

## 2023-11-20 DIAGNOSIS — R0609 Other forms of dyspnea: Secondary | ICD-10-CM | POA: Insufficient documentation

## 2023-11-20 DIAGNOSIS — Z888 Allergy status to other drugs, medicaments and biological substances status: Secondary | ICD-10-CM | POA: Insufficient documentation

## 2023-11-20 DIAGNOSIS — Z5112 Encounter for antineoplastic immunotherapy: Secondary | ICD-10-CM | POA: Insufficient documentation

## 2023-11-20 DIAGNOSIS — R530 Neoplastic (malignant) related fatigue: Secondary | ICD-10-CM

## 2023-11-20 DIAGNOSIS — G893 Neoplasm related pain (acute) (chronic): Secondary | ICD-10-CM | POA: Diagnosis not present

## 2023-11-20 DIAGNOSIS — F1290 Cannabis use, unspecified, uncomplicated: Secondary | ICD-10-CM | POA: Insufficient documentation

## 2023-11-20 DIAGNOSIS — Z5111 Encounter for antineoplastic chemotherapy: Secondary | ICD-10-CM | POA: Diagnosis present

## 2023-11-20 DIAGNOSIS — I2510 Atherosclerotic heart disease of native coronary artery without angina pectoris: Secondary | ICD-10-CM | POA: Insufficient documentation

## 2023-11-20 DIAGNOSIS — R5383 Other fatigue: Secondary | ICD-10-CM | POA: Insufficient documentation

## 2023-11-20 DIAGNOSIS — N62 Hypertrophy of breast: Secondary | ICD-10-CM | POA: Insufficient documentation

## 2023-11-20 DIAGNOSIS — Z880 Allergy status to penicillin: Secondary | ICD-10-CM | POA: Diagnosis not present

## 2023-11-20 DIAGNOSIS — Z79631 Long term (current) use of antimetabolite agent: Secondary | ICD-10-CM | POA: Insufficient documentation

## 2023-11-20 DIAGNOSIS — C3411 Malignant neoplasm of upper lobe, right bronchus or lung: Secondary | ICD-10-CM | POA: Insufficient documentation

## 2023-11-20 DIAGNOSIS — F17210 Nicotine dependence, cigarettes, uncomplicated: Secondary | ICD-10-CM | POA: Diagnosis not present

## 2023-11-20 DIAGNOSIS — Z7901 Long term (current) use of anticoagulants: Secondary | ICD-10-CM | POA: Diagnosis not present

## 2023-11-20 DIAGNOSIS — M549 Dorsalgia, unspecified: Secondary | ICD-10-CM | POA: Insufficient documentation

## 2023-11-20 DIAGNOSIS — J432 Centrilobular emphysema: Secondary | ICD-10-CM | POA: Insufficient documentation

## 2023-11-20 DIAGNOSIS — Z95828 Presence of other vascular implants and grafts: Secondary | ICD-10-CM

## 2023-11-20 DIAGNOSIS — I10 Essential (primary) hypertension: Secondary | ICD-10-CM | POA: Insufficient documentation

## 2023-11-20 LAB — CBC WITH DIFFERENTIAL/PLATELET
Abs Immature Granulocytes: 0.01 10*3/uL (ref 0.00–0.07)
Basophils Absolute: 0 10*3/uL (ref 0.0–0.1)
Basophils Relative: 0 %
Eosinophils Absolute: 0.4 10*3/uL (ref 0.0–0.5)
Eosinophils Relative: 8 %
HCT: 40.6 % (ref 39.0–52.0)
Hemoglobin: 12.6 g/dL — ABNORMAL LOW (ref 13.0–17.0)
Immature Granulocytes: 0 %
Lymphocytes Relative: 20 %
Lymphs Abs: 1 10*3/uL (ref 0.7–4.0)
MCH: 30.9 pg (ref 26.0–34.0)
MCHC: 31 g/dL (ref 30.0–36.0)
MCV: 99.5 fL (ref 80.0–100.0)
Monocytes Absolute: 0.6 10*3/uL (ref 0.1–1.0)
Monocytes Relative: 13 %
Neutro Abs: 2.8 10*3/uL (ref 1.7–7.7)
Neutrophils Relative %: 59 %
Platelets: 126 10*3/uL — ABNORMAL LOW (ref 150–400)
RBC: 4.08 MIL/uL — ABNORMAL LOW (ref 4.22–5.81)
RDW: 15.6 % — ABNORMAL HIGH (ref 11.5–15.5)
WBC: 4.8 10*3/uL (ref 4.0–10.5)
nRBC: 0 % (ref 0.0–0.2)

## 2023-11-20 LAB — COMPREHENSIVE METABOLIC PANEL
ALT: 14 U/L (ref 0–44)
AST: 16 U/L (ref 15–41)
Albumin: 4 g/dL (ref 3.5–5.0)
Alkaline Phosphatase: 63 U/L (ref 38–126)
Anion gap: 3 — ABNORMAL LOW (ref 5–15)
BUN: 18 mg/dL (ref 6–20)
CO2: 36 mmol/L — ABNORMAL HIGH (ref 22–32)
Calcium: 9.1 mg/dL (ref 8.9–10.3)
Chloride: 102 mmol/L (ref 98–111)
Creatinine, Ser: 0.93 mg/dL (ref 0.61–1.24)
GFR, Estimated: >60 mL/min (ref >60)
Glucose, Bld: 75 mg/dL (ref 70–99)
Potassium: 4.4 mmol/L (ref 3.5–5.1)
Sodium: 141 mmol/L (ref 135–145)
Total Bilirubin: 0.5 mg/dL (ref 0.0–1.2)
Total Protein: 6.9 g/dL (ref 6.5–8.1)

## 2023-11-20 MED ORDER — SODIUM CHLORIDE 0.9% FLUSH
10.0000 mL | Freq: Once | INTRAVENOUS | Status: AC | PRN
  Administered 2023-11-20: 10 mL

## 2023-11-20 MED ORDER — HEPARIN SOD (PORK) LOCK FLUSH 100 UNIT/ML IV SOLN
500.0000 [IU] | Freq: Once | INTRAVENOUS | Status: AC
  Administered 2023-11-20: 500 [IU] via INTRAVENOUS

## 2023-11-20 MED ORDER — IOHEXOL 300 MG/ML  SOLN
100.0000 mL | Freq: Once | INTRAMUSCULAR | Status: AC | PRN
  Administered 2023-11-20: 100 mL via INTRAVENOUS

## 2023-11-20 MED ORDER — HEPARIN SOD (PORK) LOCK FLUSH 100 UNIT/ML IV SOLN
INTRAVENOUS | Status: AC
  Filled 2023-11-20: qty 5

## 2023-11-21 LAB — T4: T4, Total: 8 ug/dL (ref 4.5–12.0)

## 2023-11-21 NOTE — Progress Notes (Unsigned)
 Palliative Medicine [facility]  Telephone:([phone] Fax:([phone]   Name: Maxwell Grant Date: 11/21/2023 MRN: [id number]  DOB: 06-18-1966  Patient Care Team: Tracey Harries, MD as PCP - General (Family Medicine)   I connected with Maxwell Grant on 11/21/23 at  3:30 PM EST by phone and verified that I am speaking with the correct person using two identifiers.   I discussed the limitations, risks, security and privacy concerns of performing an evaluation and management service by telemedicine and the availability of in-person appointments. I also discussed with the patient that there may be a patient responsible charge related to this service. The patient expressed understanding and agreed to proceed.   Other persons participating in the visit and their role in the encounter: n/a   Patient's location: home  Provider's location: [facility]   Chief Complaint: f/u of symptom management   INTERVAL HISTORY: Maxwell Grant is a 58 y.o. male with  oncologic medical history including new diagnosed non-small cell lung cancer (10/2022), as well as a history of COPD, diabetes mellitus, hypertension, dyslipidemia as well as history of osteomyelitis of the back.  Palliative ask to see for symptom and pain management and goals of care.     SOCIAL HISTORY:    Maxwell Grant reports that he has been smoking cigarettes. He has never used smokeless tobacco. He reports current alcohol use of about 1.0 standard drink of alcohol per week. He reports current drug use. Drug: Marijuana.  ADVANCE DIRECTIVES: none on file   CODE STATUS: Full Code  PAST MEDICAL HISTORY: Past Medical History:  Diagnosis Date   CHF (congestive heart failure) (HCC)    COPD (chronic obstructive pulmonary disease) (HCC)    Diabetes mellitus without complication (HCC)    Dyspnea    Hypertension    Lung cancer (HCC) 10/2022   Right leg DVT (HCC) 12/21/2022    ALLERGIES:  is allergic to statins,  aspirin, flexeril [cyclobenzaprine], and penicillins.  MEDICATIONS:  Current Outpatient Medications  Medication Sig Dispense Refill   Accu-Chek FastClix Lancets MISC Use to test blood sugar 4 times a day 102 each 0   albuterol (PROVENTIL) (2.5 MG/3ML) 0.083% nebulizer solution Inhale 3 mL (2.5 mg total) by nebulization every 6 (six) hours as needed for wheezing. 360 mL 3   albuterol (VENTOLIN HFA) 108 (90 Base) MCG/ACT inhaler Inhale 1-2 puffs into the lungs every 6 (six) hours as needed for wheezing or shortness of breath. 18 g 1   albuterol (VENTOLIN HFA) 108 (90 Base) MCG/ACT inhaler Inhale 2 puffs every 6 (six) hours as needed for wheezing. 54 g 3   apixaban (ELIQUIS) 5 MG TABS tablet Take 1 tablet (5 mg total) by mouth 2 (two) times daily. 60 tablet 4   benzonatate (TESSALON) 100 MG capsule Take 1 capsule (100 mg total) by mouth 3 (three) times daily as needed. 30 capsule 2   fluticasone (FLONASE) 50 MCG/ACT nasal spray Place 1 spray into both nostrils daily. 16 g 2   Fluticasone-Umeclidin-Vilant (TRELEGY ELLIPTA) 100-62.5-25 MCG/ACT AEPB Inhale 1 puff into the lungs daily in the afternoon. 60 each 5   folic acid (FOLVITE) 1 MG tablet Take 1 tablet (1 mg total) by mouth daily. 90 tablet 1   furosemide (LASIX) 20 MG tablet Take 1 tablet (20 mg) by mouth daily as needed. 7 tablet 0   gabapentin (NEURONTIN) 300 MG capsule Take 2 capsules (600mg ) in the morning, 1 capsule (300 mg ) midday, and 2 capsules at  bedtime (600mg ). 150 capsule 2   glimepiride (AMARYL) 2 MG tablet Take 2 mg by mouth in the morning.     lidocaine-prilocaine (EMLA) cream Apply to [facility] Prior to Access as needed 30 g 2   meloxicam (MOBIC) 7.5 MG tablet Take 1 tablet (7.5 mg total) by mouth 2 (two) times daily. 60 tablet 3   ondansetron (ZOFRAN) 8 MG tablet Take 1 tablet (8 mg) by mouth every 8 hours as needed for nausea or vomiting. 30 tablet 2   OVER THE COUNTER MEDICATION Take 1 tablet by mouth daily as needed  (constipation). " Stool softener     oxyCODONE (OXYCONTIN) 20 mg 12 hr tablet Take 1 tablet (20 mg total) by mouth every 12 (twelve) hours. 60 tablet 0   oxyCODONE-acetaminophen (PERCOCET) 7.5-325 MG tablet Take 1 tablet by mouth every 6 (six) hours as needed for severe pain (pain score 7-10). 60 tablet 0   prochlorperazine (COMPAZINE) 10 MG tablet Take 1 tablet (10 mg total) by mouth every 6 (six) hours as needed for nausea or vomiting. 30 tablet 0   TRELEGY ELLIPTA 100-62.5-25 MCG/ACT AEPB Inhale 1 puff once daily. 180 each 3   No current facility-administered medications for this visit.    VITAL SIGNS: There were no vitals taken for this visit. There were no vitals filed for this visit.  Estimated body mass index is 32.65 kg/m as calculated from the following:   Height as of 11/06/23: 5\' 6"  (1.676 m).   Weight as of 11/06/23: 202 lb 4.8 oz (91.8 kg).   PERFORMANCE STATUS (ECOG) : 2 - Symptomatic, <50% confined to bed   Physical Exam General: NAD Cardiovascular: regular rate and rhythm Skin: no rashes Neurological: oriented x 4   Discussed the use of AI scribe software for clinical note transcription with the patient, who gave verbal consent to proceed.   IMPRESSION:  [name] is a 58 year old male who presents to clinic for symptom management follow-up. No acute distress. Wife is present. His appetite is good, although his weight has decreased slightly from 205 lbs to 202 lbs, which he prefers as it helps him 'breathe a little better.' Denies nausea, vomiting, constipation, or diarrhea. Occasional fatigue however tries to remain as active as possible.   The patient reports pain is well controlled on current regimen which includes Oxycontin 20mg  every 12 hours and Percocet 7.5/325mg  every 6 hours as needed. He has been unable to obtain his prescribed OxyContin due to a prior authorization issue with his insurance. He has been able to pick up his other medications,  including Percocet, but has been taking it more frequently due to the absence of OxyContin. Patient is aware we will follow-up with his insurance.   He continues to have neuropathic symptoms which included numbness and tingling. Currently taking gabapentin at a dose of 300 mg in the morning and at night. Initially, he noticed some relief but subsequently, it has not been effective in managing his symptoms, which he describes as feeling like 'needles, knives, and everything else.' Education provided on use and ways to manage symptoms. Will increase his gabapentin to 300mg  twice daily and 600mg  at bedtime. If further adjustments are required will titrate up to 600mg  three times daily. Patient and wife verbalized understanding.   All questions answered and support provided.  Assessment and Plan  Chronic Cancer Related Pain Persistent pain despite current regimen. Gabapentin not providing significant relief for neuropathic symptoms. Prior authorization needed for Oxycontin. - Increase  Gabapentin to 300mg  in the morning, 300mg  in the afternoon, and 600mg  at bedtime. - Complete prior authorization for Oxycontin 20mg  every 12 hours. - Predate prescription for Percocet to be filled by 11/11/2023. -Will continue to closely monitor and adjust regimen as needed.   Insomnia Difficulty sleeping, possibly due to pain and anxiety. - Monitor response to increased Gabapentin dose at bedtime before considering additional interventions.  Follow-up in 2 weeks via phone call since provider will be out of office during the next scheduled visit. Plan to see patient in person on 12/18/2023.  Patient expressed understanding and was in agreement with this plan. He also understands that He can call the clinic at any time with any questions, concerns, or complaints.    Any controlled substances utilized were prescribed in the context of palliative care. PDMP has been reviewed.   Visit consisted of counseling and education  dealing with the complex and emotionally intense issues of symptom management and palliative care in the setting of serious and potentially life-threatening illness.  Maxwell Grant, AGPCNP-BC  Palliative Medicine Team/[facility]

## 2023-11-22 ENCOUNTER — Inpatient Hospital Stay (HOSPITAL_BASED_OUTPATIENT_CLINIC_OR_DEPARTMENT_OTHER): Admitting: Nurse Practitioner

## 2023-11-22 ENCOUNTER — Encounter: Payer: Self-pay | Admitting: Nurse Practitioner

## 2023-11-22 DIAGNOSIS — Z515 Encounter for palliative care: Secondary | ICD-10-CM

## 2023-11-22 DIAGNOSIS — C3490 Malignant neoplasm of unspecified part of unspecified bronchus or lung: Secondary | ICD-10-CM

## 2023-11-22 DIAGNOSIS — G893 Neoplasm related pain (acute) (chronic): Secondary | ICD-10-CM | POA: Diagnosis not present

## 2023-11-26 ENCOUNTER — Encounter (HOSPITAL_COMMUNITY): Payer: Self-pay

## 2023-11-26 ENCOUNTER — Other Ambulatory Visit (HOSPITAL_COMMUNITY): Payer: Self-pay

## 2023-11-27 ENCOUNTER — Other Ambulatory Visit (HOSPITAL_COMMUNITY): Payer: Self-pay

## 2023-11-27 ENCOUNTER — Inpatient Hospital Stay: Payer: Medicaid Other

## 2023-11-27 ENCOUNTER — Inpatient Hospital Stay (HOSPITAL_BASED_OUTPATIENT_CLINIC_OR_DEPARTMENT_OTHER): Payer: Medicaid Other | Admitting: Internal Medicine

## 2023-11-27 VITALS — BP 114/75 | HR 70 | Temp 98.3°F | Resp 17 | Ht 66.0 in | Wt 201.1 lb

## 2023-11-27 DIAGNOSIS — C3490 Malignant neoplasm of unspecified part of unspecified bronchus or lung: Secondary | ICD-10-CM

## 2023-11-27 DIAGNOSIS — Z95828 Presence of other vascular implants and grafts: Secondary | ICD-10-CM

## 2023-11-27 DIAGNOSIS — Z5112 Encounter for antineoplastic immunotherapy: Secondary | ICD-10-CM | POA: Diagnosis not present

## 2023-11-27 LAB — CBC WITH DIFFERENTIAL (CANCER CENTER ONLY)
Abs Immature Granulocytes: 0.01 10*3/uL (ref 0.00–0.07)
Basophils Absolute: 0 10*3/uL (ref 0.0–0.1)
Basophils Relative: 1 %
Eosinophils Absolute: 0.4 10*3/uL (ref 0.0–0.5)
Eosinophils Relative: 9 %
HCT: 41.5 % (ref 39.0–52.0)
Hemoglobin: 12.9 g/dL — ABNORMAL LOW (ref 13.0–17.0)
Immature Granulocytes: 0 %
Lymphocytes Relative: 14 %
Lymphs Abs: 0.7 10*3/uL (ref 0.7–4.0)
MCH: 30.6 pg (ref 26.0–34.0)
MCHC: 31.1 g/dL (ref 30.0–36.0)
MCV: 98.3 fL (ref 80.0–100.0)
Monocytes Absolute: 0.3 10*3/uL (ref 0.1–1.0)
Monocytes Relative: 6 %
Neutro Abs: 3.4 10*3/uL (ref 1.7–7.7)
Neutrophils Relative %: 70 %
Platelet Count: 182 10*3/uL (ref 150–400)
RBC: 4.22 MIL/uL (ref 4.22–5.81)
RDW: 14.8 % (ref 11.5–15.5)
WBC Count: 4.9 10*3/uL (ref 4.0–10.5)
nRBC: 0 % (ref 0.0–0.2)

## 2023-11-27 LAB — CMP (CANCER CENTER ONLY)
ALT: 9 U/L (ref 0–44)
AST: 12 U/L — ABNORMAL LOW (ref 15–41)
Albumin: 4 g/dL (ref 3.5–5.0)
Alkaline Phosphatase: 70 U/L (ref 38–126)
Anion gap: 3 — ABNORMAL LOW (ref 5–15)
BUN: 13 mg/dL (ref 6–20)
CO2: 39 mmol/L — ABNORMAL HIGH (ref 22–32)
Calcium: 8.7 mg/dL — ABNORMAL LOW (ref 8.9–10.3)
Chloride: 100 mmol/L (ref 98–111)
Creatinine: 0.89 mg/dL (ref 0.61–1.24)
GFR, Estimated: >60 mL/min (ref >60)
Glucose, Bld: 150 mg/dL — ABNORMAL HIGH (ref 70–99)
Potassium: 4.2 mmol/L (ref 3.5–5.1)
Sodium: 142 mmol/L (ref 135–145)
Total Bilirubin: 0.6 mg/dL (ref 0.0–1.2)
Total Protein: 6.8 g/dL (ref 6.5–8.1)

## 2023-11-27 MED ORDER — SODIUM CHLORIDE 0.9 % IV SOLN: Freq: Once | INTRAVENOUS | Status: AC

## 2023-11-27 MED ORDER — SODIUM CHLORIDE 0.9 % IV SOLN
500.0000 mg/m2 | Freq: Once | INTRAVENOUS | Status: AC
  Administered 2023-11-27: 1000 mg via INTRAVENOUS
  Filled 2023-11-27: qty 40

## 2023-11-27 MED ORDER — SODIUM CHLORIDE 0.9% FLUSH
10.0000 mL | Freq: Once | INTRAVENOUS | Status: AC | PRN
  Administered 2023-11-27: 10 mL

## 2023-11-27 MED ORDER — SODIUM CHLORIDE 0.9% FLUSH
10.0000 mL | INTRAVENOUS | Status: DC | PRN
  Administered 2023-11-27: 10 mL

## 2023-11-27 MED ORDER — HEPARIN SOD (PORK) LOCK FLUSH 100 UNIT/ML IV SOLN
500.0000 [IU] | Freq: Once | INTRAVENOUS | Status: AC | PRN
Start: 2023-11-27 — End: 2023-11-27
  Administered 2023-11-27: 500 [IU]

## 2023-11-27 MED ORDER — PROCHLORPERAZINE MALEATE 10 MG PO TABS
10.0000 mg | ORAL_TABLET | Freq: Once | ORAL | Status: AC
  Administered 2023-11-27: 10 mg via ORAL
  Filled 2023-11-27: qty 1

## 2023-11-27 MED ORDER — SODIUM CHLORIDE 0.9 % IV SOLN
200.0000 mg | Freq: Once | INTRAVENOUS | Status: AC
  Administered 2023-11-27: 200 mg via INTRAVENOUS
  Filled 2023-11-27: qty 200

## 2023-11-27 NOTE — Patient Instructions (Signed)
 [facility] - A DEPT OF [facility]  Discharge Instructions: Thank you for choosing [facility] to provide your oncology and hematology care.   If you have a lab appointment with the Cancer Center, please go directly to the Cancer Center and check in at the registration area.   Wear comfortable clothing and clothing appropriate for easy access to any Portacath or PICC line.   We strive to give you quality time with your provider. You may need to reschedule your appointment if you arrive late (15 or more minutes).  Arriving late affects you and other patients whose appointments are after yours.  Also, if you miss three or more appointments without notifying the office, you may be dismissed from the clinic at the provider's discretion.      For prescription refill requests, have your pharmacy contact our office and allow 72 hours for refills to be completed.    Today you received the following chemotherapy and/or immunotherapy agents: Keytruda/Alimta      To help prevent nausea and vomiting after your treatment, we encourage you to take your nausea medication as directed.  BELOW ARE SYMPTOMS THAT SHOULD BE REPORTED IMMEDIATELY: *FEVER GREATER THAN 100.4 F (38 C) OR HIGHER *CHILLS OR SWEATING *NAUSEA AND VOMITING THAT IS NOT CONTROLLED WITH YOUR NAUSEA MEDICATION *UNUSUAL SHORTNESS OF BREATH *UNUSUAL BRUISING OR BLEEDING *URINARY PROBLEMS (pain or burning when urinating, or frequent urination) *BOWEL PROBLEMS (unusual diarrhea, constipation, pain near the anus) TENDERNESS IN MOUTH AND THROAT WITH OR WITHOUT PRESENCE OF ULCERS (sore throat, sores in mouth, or a toothache) UNUSUAL RASH, SWELLING OR PAIN  UNUSUAL VAGINAL DISCHARGE OR ITCHING   Items with * indicate a potential emergency and should be followed up as soon as possible or go to the Emergency Department if any problems should occur.  Please show the CHEMOTHERAPY ALERT CARD or  IMMUNOTHERAPY ALERT CARD at check-in to the Emergency Department and triage nurse.  Should you have questions after your visit or need to cancel or reschedule your appointment, please contact [facility] - A DEPT OF [facility]  Dept: [phone]  and follow the prompts.  Office hours are 8:00 a.m. to 4:30 p.m. Monday - Friday. Please note that voicemails left after 4:00 p.m. may not be returned until the following business day.  We are closed weekends and major holidays. You have access to a nurse at all times for urgent questions. Please call the main number to the clinic Dept: [phone] and follow the prompts.   For any non-urgent questions, you may also contact your provider using MyChart. We now offer e-Visits for anyone 6 and older to request care online for non-urgent symptoms. For details visit [email or web address].   Also download the MyChart app! Go to the app store, search "MyChart", open the app, select [place], and log in with your MyChart username and password.

## 2023-11-27 NOTE — Progress Notes (Signed)
 [facility] Telephone:(336) [phone]   Fax:(336) [phone]  OFFICE PROGRESS NOTE  Tracey Harries, MD [address] [place] [address]  DIAGNOSIS:  1) Stage IV (T3, N2, M1 C) non-small cell lung cancer, adenocarcinoma presented with large right upper lobe lung mass in addition to right hilar and subcarinal lymphadenopathy in addition to multiple metastatic bone lesions involving the pelvis as well as the lower thoracic and lumbar spines in addition to metastatic disease to the pelvic muscles diagnosed in February 2024.  2) deep venous thrombosis of the right lower extremity diagnosed on December 18, 2022  Detected Alteration(s) / Biomarker(s) Associated FDA-approved therapies Clinical Trial Availability% cfDNA or Amplification  KRAS G12C approved by FDA Adagrasib, Sotorasib Yes 29.3%  CDK4 Amplification None Yes High (+++)Plasma Copy Number: 8.0  PD-L1 expression 98%  PRIOR THERAPY: None  CURRENT THERAPY:  1) Systemic chemotherapy with carboplatin for AUC of 5, Alimta 500 Mg/M2 and Keytruda 200 Mg IV every 3 weeks.  First dose November 14, 2022.  Status post 17 cycles.  Starting from cycle #5 the patient is on maintenance treatment with Alimta and Keytruda every 3 weeks. 2) Eliquis 10 mg p.o. twice daily for 7 days followed by 5 mg p.o. twice daily.  First dose 12/26/2022.  INTERVAL HISTORY: Maxwell Grant 58 y.o. male returns to the clinic today for follow-up visit after by his sister. Discussed the use of AI scribe software for clinical note transcription with the patient, who gave verbal consent to proceed.  History of Present Illness   The patient is a 58 year old with lung cancer who presents for ongoing chemotherapy treatment.  He has a history of lung cancer with a K-RAS G12C mutation and a PD-L1 expression of 98%, diagnosed in February 2024. He is currently on cycle number nineteen of chemotherapy. A recent scan of the chest, abdomen, and  pelvis last week showed no evidence of disease progression.  No chest pain, nausea, vomiting, diarrhea, weight loss, cough, or hemoptysis. He attributes any respiratory symptoms to pollen exposure.  He is followed by the palliative care team for back pain, which he has experienced even before his cancer diagnosis.        MEDICAL HISTORY: Past Medical History:  Diagnosis Date   CHF (congestive heart failure) (HCC)    COPD (chronic obstructive pulmonary disease) (HCC)    Diabetes mellitus without complication (HCC)    Dyspnea    Hypertension    Lung cancer (HCC) 10/2022   Right leg DVT (HCC) 12/21/2022    ALLERGIES:  is allergic to statins, aspirin, flexeril [cyclobenzaprine], and penicillins.  MEDICATIONS:  Current Outpatient Medications  Medication Sig Dispense Refill   Accu-Chek FastClix Lancets MISC Use to test blood sugar 4 times a day 102 each 0   albuterol (PROVENTIL) (2.5 MG/3ML) 0.083% nebulizer solution Inhale 3 mL (2.5 mg total) by nebulization every 6 (six) hours as needed for wheezing. 360 mL 3   albuterol (VENTOLIN HFA) 108 (90 Base) MCG/ACT inhaler Inhale 1-2 puffs into the lungs every 6 (six) hours as needed for wheezing or shortness of breath. 18 g 1   albuterol (VENTOLIN HFA) 108 (90 Base) MCG/ACT inhaler Inhale 2 puffs every 6 (six) hours as needed for wheezing. 54 g 3   apixaban (ELIQUIS) 5 MG TABS tablet Take 1 tablet (5 mg total) by mouth 2 (two) times daily. 60 tablet 4   benzonatate (TESSALON) 100 MG capsule Take 1 capsule (100  mg total) by mouth 3 (three) times daily as needed. 30 capsule 2   fluticasone (FLONASE) 50 MCG/ACT nasal spray Place 1 spray into both nostrils daily. 16 g 2   Fluticasone-Umeclidin-Vilant (TRELEGY ELLIPTA) 100-62.5-25 MCG/ACT AEPB Inhale 1 puff into the lungs daily in the afternoon. 60 each 5   folic acid (FOLVITE) 1 MG tablet Take 1 tablet (1 mg total) by mouth daily. 90 tablet 1   furosemide (LASIX) 20 MG tablet Take 1 tablet (20  mg) by mouth daily as needed. 7 tablet 0   gabapentin (NEURONTIN) 300 MG capsule Take 2 capsules (600mg ) in the morning, 1 capsule (300 mg ) midday, and 2 capsules at bedtime (600mg ). 150 capsule 2   glimepiride (AMARYL) 2 MG tablet Take 2 mg by mouth in the morning.     lidocaine-prilocaine (EMLA) cream Apply to [facility] Prior to Access as needed 30 g 2   meloxicam (MOBIC) 7.5 MG tablet Take 1 tablet (7.5 mg total) by mouth 2 (two) times daily. 60 tablet 3   ondansetron (ZOFRAN) 8 MG tablet Take 1 tablet (8 mg) by mouth every 8 hours as needed for nausea or vomiting. 30 tablet 2   OVER THE COUNTER MEDICATION Take 1 tablet by mouth daily as needed (constipation). " Stool softener     oxyCODONE (OXYCONTIN) 20 mg 12 hr tablet Take 1 tablet (20 mg total) by mouth every 12 (twelve) hours. 60 tablet 0   oxyCODONE-acetaminophen (PERCOCET) 7.5-325 MG tablet Take 1 tablet by mouth every 6 (six) hours as needed for severe pain (pain score 7-10). 60 tablet 0   prochlorperazine (COMPAZINE) 10 MG tablet Take 1 tablet (10 mg total) by mouth every 6 (six) hours as needed for nausea or vomiting. 30 tablet 0   TRELEGY ELLIPTA 100-62.5-25 MCG/ACT AEPB Inhale 1 puff once daily. 180 each 3   No current facility-administered medications for this visit.    SURGICAL HISTORY:  Past Surgical History:  Procedure Laterality Date   BRONCHIAL NEEDLE ASPIRATION BIOPSY  10/30/2022   Procedure: BRONCHIAL NEEDLE ASPIRATION BIOPSIES;  Surgeon: Leslye Peer, MD;  Location: [facility] ENDOSCOPY;  Service: Cardiopulmonary;;   IR IMAGING GUIDED PORT INSERTION  11/17/2022   NO PAST SURGERIES     RADIOLOGY WITH ANESTHESIA  10/30/2022   Procedure: MRI WITH ANESTHESIA W/WO CONTRAST;  Surgeon: Leslye Peer, MD;  Location: [facility] ENDOSCOPY;  Service: Cardiopulmonary;;   VIDEO BRONCHOSCOPY WITH ENDOBRONCHIAL ULTRASOUND Right 10/30/2022   Procedure: VIDEO BRONCHOSCOPY WITH ENDOBRONCHIAL ULTRASOUND;  Surgeon: Leslye Peer, MD;  Location: [facility]  ENDOSCOPY;  Service: Cardiopulmonary;  Laterality: Right;    REVIEW OF SYSTEMS:  Constitutional: positive for fatigue Eyes: negative Ears, nose, mouth, throat, and face: negative Respiratory: positive for dyspnea on exertion Cardiovascular: negative Gastrointestinal: negative Genitourinary:negative Integument/breast: negative Hematologic/lymphatic: negative Musculoskeletal:positive for back pain Neurological: negative Behavioral/Psych: negative Endocrine: negative Allergic/Immunologic: negative   PHYSICAL EXAMINATION: General appearance: alert, cooperative, fatigued, and no distress Head: Normocephalic, without obvious abnormality, atraumatic Neck: no adenopathy, no JVD, supple, symmetrical, trachea midline, and thyroid not enlarged, symmetric, no tenderness/mass/nodules Lymph nodes: Cervical, supraclavicular, and axillary nodes normal. Resp: clear to auscultation bilaterally Back: symmetric, no curvature. ROM normal. No CVA tenderness. Cardio: regular rate and rhythm, S1, S2 normal, no murmur, click, rub or gallop GI: soft, non-tender; bowel sounds normal; no masses,  no organomegaly Extremities: extremities normal, atraumatic, no cyanosis or edema Neurologic: Alert and oriented X 3, normal strength and tone. Normal symmetric reflexes. Normal coordination and gait  ECOG  PERFORMANCE STATUS: 1 - Symptomatic but completely ambulatory  Blood pressure 114/75, pulse 70, temperature 98.3 F (36.8 C), temperature source Temporal, resp. rate 17, height 5\' 6"  (1.676 m), weight 201 lb 1.6 oz (91.2 kg), SpO2 95%.  LABORATORY DATA: Lab Results  Component Value Date   WBC 4.9 11/27/2023   HGB 12.9 (L) 11/27/2023   HCT 41.5 11/27/2023   MCV 98.3 11/27/2023   PLT 182 11/27/2023      Chemistry      Component Value Date/Time   NA 142 11/27/2023 0903   K 4.2 11/27/2023 0903   CL 100 11/27/2023 0903   CO2 39 (H) 11/27/2023 0903   BUN 13 11/27/2023 0903   CREATININE 0.89 11/27/2023  0903      Component Value Date/Time   CALCIUM 8.7 (L) 11/27/2023 0903   ALKPHOS 70 11/27/2023 0903   AST 12 (L) 11/27/2023 0903   ALT 9 11/27/2023 0903   BILITOT 0.6 11/27/2023 0903       RADIOGRAPHIC STUDIES: CT CHEST ABDOMEN PELVIS W CONTRAST Result Date: 11/26/2023 CLINICAL DATA:  Non-small cell lung cancer restaging * Tracking Code: BO * EXAM: CT CHEST, ABDOMEN, AND PELVIS WITH CONTRAST TECHNIQUE: Multidetector CT imaging of the chest, abdomen and pelvis was performed following the standard protocol during bolus administration of intravenous contrast. RADIATION DOSE REDUCTION: This exam was performed according to the departmental dose-optimization program which includes automated exposure control, adjustment of the mA and/or kV according to patient size and/or use of iterative reconstruction technique. CONTRAST:  OMNIPAQUE IOHEXOL 300 MG/ML  SOLN COMPARISON:  09/18/2023 FINDINGS: CT CHEST FINDINGS Cardiovascular: Right chest port catheter. Aortic atherosclerosis. Normal heart size. Three-vessel coronary artery calcifications no pericardial effusion. Mediastinum/Nodes: No enlarged mediastinal, hilar, or axillary lymph nodes. Thyroid gland, trachea, and esophagus demonstrate no significant findings. Lungs/Pleura: Moderate centrilobular emphysema and diffuse bilateral bronchial wall thickening. Spiculated mass in the central right upper lobe measuring 2.6 x 2.5 cm, unchanged (series 4, image 65). No pleural effusion or pneumothorax. Musculoskeletal: Bilateral gynecomastia.  No acute osseous findings. CT ABDOMEN PELVIS FINDINGS Hepatobiliary: No solid liver abnormality is seen. No gallstones, gallbladder wall thickening, or biliary dilatation. Pancreas: Unremarkable. No pancreatic ductal dilatation or surrounding inflammatory changes. Spleen: Normal in size without significant abnormality. Adrenals/Urinary Tract: Adrenal glands are unremarkable. Kidneys are normal, without renal calculi, solid  lesion, or hydronephrosis. Bladder is unremarkable. Stomach/Bowel: Stomach is within normal limits. Appendix appears normal. No evidence of bowel wall thickening, distention, or inflammatory changes. Vascular/Lymphatic: Severe aortic atherosclerosis. No enlarged abdominal or pelvic lymph nodes. Reproductive: No mass or other abnormality. Other: No abdominal wall hernia or abnormality. No ascites. Musculoskeletal: No acute osseous findings. Unchanged mixed lytic and sclerotic osseous metastatic disease again including sclerotic lesions and pathologic fractures of the manubrium, T8 vertebral body, and left ilium. IMPRESSION: 1. Unchanged spiculated mass in the central right upper lobe. 2. Unchanged mixed lytic and sclerotic osseous metastatic disease again including sclerotic lesions and pathologic fractures of the manubrium, T8 vertebral body, and left ilium. 3. No evidence of lymphadenopathy or soft tissue metastatic disease in the chest, abdomen, or pelvis. 4. Emphysema and diffuse bilateral bronchial wall thickening. 5. Coronary artery disease. Aortic Atherosclerosis (ICD10-I70.0) and Emphysema (ICD10-J43.9). Electronically Signed   By: Jearld Lesch M.D.   On: 11/26/2023 21:51     ASSESSMENT AND PLAN: This is a very pleasant 58 years old white male with stage IV (T3, N2, M1 C) non-small cell lung cancer, adenocarcinoma presented with large right upper  lobe lung mass in addition to right hilar and subcarinal lymphadenopathy in addition to multiple metastatic bone lesions involving the pelvis as well as the lower thoracic and lumbar spines in addition to metastatic disease to the pelvic muscles diagnosed in February 2024.  Molecular studies showed positive KRAS G12C mutation and PD-L1 expression of 98%. The patient is here today to start the first cycle of systemic chemotherapy with carboplatin for AUC of 5, Alimta 500 Mg/M2 and Keytruda 200 Mg IV every 3 weeks.  First dose November 14, 2022.  Status post 18  cycles.  Starting from cycle #5 the patient is on maintenance treatment with Alimta and Keytruda every 3 weeks. The patient has been tolerating this treatment fairly well with no concerning adverse effects. He had repeat CT scan of the chest, abdomen and pelvis performed recently.  I personally and independently reviewed the scan and discussed the result with the patient and his sister. His scan showed no concerning findings for disease progression. Non-small cell lung cancer with KRAS G12C mutation and high PD-L1 expression Diagnosed in February 2024 with KRAS G12C mutation and 98% PD-L1 expression. Currently undergoing chemotherapy, cycle nineteen. Recent imaging of the chest, abdomen, and pelvis shows no disease progression, indicating effective treatment and disease control. - Proceed with cycle nineteen of chemotherapy. - Continue monitoring with scans every three cycles.  Back pain Experiencing back pain managed by the palliative care team. Considering radiation therapy to address the pain, though it is unclear if the pain is entirely cancer-related due to pre-existing back issues. - Refer to radiation oncology for evaluation of potential radiation therapy for back pain.  Goals of Care Has a living will and expressed a desire for a Do Not Resuscitate (DNR) order. Communicated that he does not wish to undergo resuscitation if his heart or breathing stops. - Obtain a copy of the living will for the chart. - Ensure documentation of the DNR order in the medical record.  Follow-up Scheduled for regular follow-up visits and scans to monitor lung cancer status and treatment effectiveness. - Schedule follow-up visit in three weeks. - Check with the palliative care team regarding the next appointment.   He was advised to call immediately if he has any other concerning symptoms in the interval. The patient voices understanding of current disease status and treatment options and is in agreement  with the current care plan.  All questions were answered. The patient knows to call the clinic with any problems, questions or concerns. We can certainly see the patient much sooner if necessary.  The total time spent in the appointment was 30 minutes.  Disclaimer: This note was dictated with voice recognition software. Similar sounding words can inadvertently be transcribed and may not be corrected upon review.

## 2023-11-28 NOTE — Progress Notes (Signed)
 Radiation Oncology         [phone] ________________________________  Outpatient Consultation - Conducted via telephone at patient request.  I spoke with the patient to conduct this consult visit via telephone. The patient was notified in advance and was offered an in person or telemedicine meeting to allow for face to face communication but instead preferred to proceed with a telephone consult.   Name: Maxwell Grant        MRN: [id number]  Date of Service: 12/04/2023 DOB: Feb 03, 1966  NW:GNFAOZ, Maxwell Hua, MD  Si Gaul, MD     REFERRING PHYSICIAN: Si Gaul, MD   DIAGNOSIS: The encounter diagnosis was Malignant neoplasm of unspecified part of unspecified bronchus or lung (HCC).   HISTORY OF PRESENT ILLNESS: Maxwell Grant is a 58 y.o. male with a history of stage IV lung cancer.  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  He was referred to pulmonary and on 10/30/2022 underwent bronchoscopy with endobronchial ultrasound, his cytology was consistent with adenocarcinoma, and an MRI of the lumbar spine was performed on 10/30/2022 showing enhancing metastatic disease involving the L5 vertebral body, T11, extensive disease in the posterior elements and L2, and MRI of the pelvis also showed extensive disease of the left iliac bone and adjacent pelvic muscles.  In addition the right iliac L5 and left femur as well as left pelvic sidewall adenopathy were noted.  He went for PET imaging on 11/13/2022 but could not comfortably lay flat to proceed.  He was offered palliative radiation in February 2024, proceeded with simulation appointment however could not tolerate laying flat on the table to proceed and decided to forego palliative radiation.  Since that time, the patient has been  receiving systemic therapy, beginning on 11/14/2022.with carboplatin, Alimta, and systemic Keytruda with maintenance Alimta/Keytruda since cycle 5.  He has remained on Alimta/Keytruda since.  His most recent infusion was on 11/27/2023.  Recent imaging on 11/20/2023 with CT chest abdomen pelvis showed unchanged spiculated mass in the central right upper lobe, unchanged mixed lytic and sclerotic metastatic disease including changes in the manubrium, T8 vertebral body and left ilium, and no additional evidence of metastatic disease.  He discussed these results with Dr. Arbutus Grant and has also been managing pain with the palliative care team.  Given persisting back pain, he is evaluated by phone today to discuss the possibilities of additional treatment with radiation.    PREVIOUS RADIATION THERAPY: No   PAST MEDICAL HISTORY:  Past Medical History:  Diagnosis Date   CHF (congestive heart failure) (HCC)    COPD (chronic obstructive pulmonary disease) (HCC)    Diabetes mellitus without complication (HCC)    Dyspnea    Hypertension    Lung cancer (HCC) 10/2022   Right leg DVT (HCC) 12/21/2022       PAST SURGICAL HISTORY: Past Surgical History:  Procedure Laterality Date   BRONCHIAL NEEDLE ASPIRATION BIOPSY  10/30/2022   Procedure: BRONCHIAL NEEDLE ASPIRATION BIOPSIES;  Surgeon: Leslye Peer, MD;  Location: [facility] ENDOSCOPY;  Service: Cardiopulmonary;;   IR IMAGING GUIDED PORT INSERTION  11/17/2022   NO PAST SURGERIES     RADIOLOGY WITH ANESTHESIA  10/30/2022   Procedure: MRI WITH ANESTHESIA W/WO CONTRAST;  Surgeon: Leslye Peer, MD;  Location: [facility] ENDOSCOPY;  Service: Cardiopulmonary;;  VIDEO BRONCHOSCOPY WITH ENDOBRONCHIAL ULTRASOUND Right 10/30/2022   Procedure: VIDEO BRONCHOSCOPY WITH ENDOBRONCHIAL ULTRASOUND;  Surgeon: Leslye Peer, MD;  Location: [facility] ENDOSCOPY;  Service: Cardiopulmonary;  Laterality: Right;     FAMILY HISTORY: No family history on file.   SOCIAL HISTORY:  reports that he  has been smoking cigarettes. He has never used smokeless tobacco. He reports current alcohol use of about 1.0 standard drink of alcohol per week. He reports current drug use. Drug: Marijuana.  The patient is single but has a partner.  He lives in [place] and is retired from working as a Financial trader. His sister Maxwell Grant helps him if needed and often attends medical appointments with him.    ALLERGIES: Statins, Aspirin, Flexeril [cyclobenzaprine], and Penicillins   MEDICATIONS:  Current Outpatient Medications  Medication Sig Dispense Refill   Accu-Chek FastClix Lancets MISC Use to test blood sugar 4 times a day 102 each 0   albuterol (PROVENTIL) (2.5 MG/3ML) 0.083% nebulizer solution Inhale 3 mL (2.5 mg total) by nebulization every 6 (six) hours as needed for wheezing. 360 mL 3   albuterol (VENTOLIN HFA) 108 (90 Base) MCG/ACT inhaler Inhale 1-2 puffs into the lungs every 6 (six) hours as needed for wheezing or shortness of breath. 18 g 1   albuterol (VENTOLIN HFA) 108 (90 Base) MCG/ACT inhaler Inhale 2 puffs every 6 (six) hours as needed for wheezing. 54 g 3   apixaban (ELIQUIS) 5 MG TABS tablet Take 1 tablet (5 mg total) by mouth 2 (two) times daily. 60 tablet 4   benzonatate (TESSALON) 100 MG capsule Take 1 capsule (100 mg total) by mouth 3 (three) times daily as needed. 30 capsule 2   fluticasone (FLONASE) 50 MCG/ACT nasal spray Place 1 spray into both nostrils daily. 16 g 2   Fluticasone-Umeclidin-Vilant (TRELEGY ELLIPTA) 100-62.5-25 MCG/ACT AEPB Inhale 1 puff into the lungs daily in the afternoon. 60 each 5   folic acid (FOLVITE) 1 MG tablet Take 1 tablet (1 mg total) by mouth daily. 90 tablet 1   furosemide (LASIX) 20 MG tablet Take 1 tablet (20 mg) by mouth daily as needed. 7 tablet 0   gabapentin (NEURONTIN) 300 MG capsule Take 2 capsules (600mg ) in the morning, 1 capsule (300 mg ) midday, and 2 capsules at bedtime (600mg ). 150 capsule 2   glimepiride (AMARYL) 2 MG tablet Take 2  mg by mouth in the morning.     lidocaine-prilocaine (EMLA) cream Apply to [facility] Prior to Access as needed 30 g 2   meloxicam (MOBIC) 7.5 MG tablet Take 1 tablet (7.5 mg total) by mouth 2 (two) times daily. 60 tablet 3   ondansetron (ZOFRAN) 8 MG tablet Take 1 tablet (8 mg) by mouth every 8 hours as needed for nausea or vomiting. 30 tablet 2   OVER THE COUNTER MEDICATION Take 1 tablet by mouth daily as needed (constipation). " Stool softener     oxyCODONE (OXYCONTIN) 20 mg 12 hr tablet Take 1 tablet (20 mg total) by mouth every 12 (twelve) hours. 60 tablet 0   oxyCODONE-acetaminophen (PERCOCET) 7.5-325 MG tablet Take 1 tablet by mouth every 6 (six) hours as needed for severe pain (pain score 7-10). 60 tablet 0   prochlorperazine (COMPAZINE) 10 MG tablet Take 1 tablet (10 mg total) by mouth every 6 (six) hours as needed for nausea or vomiting. 30 tablet 0   TRELEGY ELLIPTA 100-62.5-25 MCG/ACT AEPB Inhale 1 puff once daily. 180 each 3   No current facility-administered medications  for this visit.     REVIEW OF SYSTEMS: On review of systems, the patient reports that he is doing okay. He states that his low back and left hip pain have never quite resolved. Some months have been better but in the last few months, he has struggled to get up and move as easily as pain is created with weight bearing activities and he uses a wheelchair when he leaves home. He remains ambulatory in the house however. He denies any loss of control of bowel or bladder function. He has some neuropathy but no loss of control of his extremities. He continues to take pain medication with long and short acting but still has pain to where he rates this at 7-8 out of 10. No other complaints are verbalized.      PHYSICAL EXAM:  Unable to assess given encounter time.  ECOG = 1  0 - Asymptomatic (Fully active, able to carry on all predisease activities without restriction)  1 - Symptomatic but completely ambulatory (Restricted in  physically strenuous activity but ambulatory and able to carry out work of a light or sedentary nature. For example, light housework, office work)  2 - Symptomatic, <50% in bed during the day (Ambulatory and capable of all self care but unable to carry out any work activities. Up and about more than 50% of waking hours)  3 - Symptomatic, >50% in bed, but not bedbound (Capable of only limited self-care, confined to bed or chair 50% or more of waking hours)  4 - Bedbound (Completely disabled. Cannot carry on any self-care. Totally confined to bed or chair)  5 - Death   Maxwell Grant MM, Maxwell Grant, Maxwell Grant, et al. [phone]). "Toxicity and response criteria of the [facility]". Am. Evlyn Clines. Oncol. 5 (6): 649-55    LABORATORY DATA:  Lab Results  Component Value Date   WBC 4.9 11/27/2023   HGB 12.9 (L) 11/27/2023   HCT 41.5 11/27/2023   MCV 98.3 11/27/2023   PLT 182 11/27/2023   Lab Results  Component Value Date   NA 142 11/27/2023   K 4.2 11/27/2023   CL 100 11/27/2023   CO2 39 (H) 11/27/2023   Lab Results  Component Value Date   ALT 9 11/27/2023   AST 12 (L) 11/27/2023   ALKPHOS 70 11/27/2023   BILITOT 0.6 11/27/2023      RADIOGRAPHY: CT CHEST ABDOMEN PELVIS W CONTRAST Result Date: 11/26/2023 CLINICAL DATA:  Non-small cell lung cancer restaging * Tracking Code: BO * EXAM: CT CHEST, ABDOMEN, AND PELVIS WITH CONTRAST TECHNIQUE: Multidetector CT imaging of the chest, abdomen and pelvis was performed following the standard protocol during bolus administration of intravenous contrast. RADIATION DOSE REDUCTION: This exam was performed according to the departmental dose-optimization program which includes automated exposure control, adjustment of the mA and/or kV according to patient size and/or use of iterative reconstruction technique. CONTRAST:  OMNIPAQUE IOHEXOL 300 MG/ML  SOLN COMPARISON:  09/18/2023 FINDINGS: CT CHEST FINDINGS Cardiovascular: Right chest port  catheter. Aortic atherosclerosis. Normal heart size. Three-vessel coronary artery calcifications no pericardial effusion. Mediastinum/Nodes: No enlarged mediastinal, hilar, or axillary lymph nodes. Thyroid gland, trachea, and esophagus demonstrate no significant findings. Lungs/Pleura: Moderate centrilobular emphysema and diffuse bilateral bronchial wall thickening. Spiculated mass in the central right upper lobe measuring 2.6 x 2.5 cm, unchanged (series 4, image 65). No pleural effusion or pneumothorax. Musculoskeletal: Bilateral gynecomastia.  No acute osseous findings. CT ABDOMEN PELVIS FINDINGS Hepatobiliary: No solid liver abnormality is  seen. No gallstones, gallbladder wall thickening, or biliary dilatation. Pancreas: Unremarkable. No pancreatic ductal dilatation or surrounding inflammatory changes. Spleen: Normal in size without significant abnormality. Adrenals/Urinary Tract: Adrenal glands are unremarkable. Kidneys are normal, without renal calculi, solid lesion, or hydronephrosis. Bladder is unremarkable. Stomach/Bowel: Stomach is within normal limits. Appendix appears normal. No evidence of bowel wall thickening, distention, or inflammatory changes. Vascular/Lymphatic: Severe aortic atherosclerosis. No enlarged abdominal or pelvic lymph nodes. Reproductive: No mass or other abnormality. Other: No abdominal wall hernia or abnormality. No ascites. Musculoskeletal: No acute osseous findings. Unchanged mixed lytic and sclerotic osseous metastatic disease again including sclerotic lesions and pathologic fractures of the manubrium, T8 vertebral body, and left ilium. IMPRESSION: 1. Unchanged spiculated mass in the central right upper lobe. 2. Unchanged mixed lytic and sclerotic osseous metastatic disease again including sclerotic lesions and pathologic fractures of the manubrium, T8 vertebral body, and left ilium. 3. No evidence of lymphadenopathy or soft tissue metastatic disease in the chest, abdomen, or  pelvis. 4. Emphysema and diffuse bilateral bronchial wall thickening. 5. Coronary artery disease. Aortic Atherosclerosis (ICD10-I70.0) and Emphysema (ICD10-J43.9). Electronically Signed   By: Maxwell Grant M.D.   On: 11/26/2023 21:51       IMPRESSION/PLAN: 1. 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  Dr. Mitzi Grant  reviews the patient's course to date and has personally reviewed his imaging.  The patient remains a candidate for a course of palliative radiation for his painful bony disease. We discussed the risks, benefits, short, and long term effects of radiotherapy, as well as the palliative intent, and the patient is interested in proceeding. We reviewed the delivery and logistics of radiotherapy and Dr. Mitzi Grant anticipates a course of 2 weeks of radiotherapy to the Lumbosacral spine into the left iliac region of the pelvis. We will simulate tomorrow and place wires over the sites of pain to adequately distribute his treatment. He will still proceed with systemic therapy as planned with Dr. Arbutus Grant. 2. Pain from bone metastases. We appreciate palliative medicine's input and care for this patient and suspect he will need to continue his therapy at present during his radiation and that his pain would improve toward the conclusion of therapy.  This encounter was conducted via telephone.  The patient has provided two factor identification and has given verbal consent for this type of encounter and has been advised to only accept a meeting of this type in a secure network environment. The time spent during this encounter was 45 minutes including preparation, discussion, and coordination of the patient's care. The attendants for this meeting include Maxwell Minion, RN, Maxwell Grant  and Maxwell Grant and his sister Maxwell Grant. During the encounter,  Maxwell Minion, RN and Maxwell Grant were located at [facility] Radiation Oncology Department.  Maxwell Grant was located at home with his sister Maxwell Grant.    Osker Mason, [facility]   **Disclaimer: This note was dictated with voice recognition software. Similar sounding words can inadvertently be transcribed and this note may contain transcription errors which may not have been corrected upon publication of note.**

## 2023-11-29 ENCOUNTER — Other Ambulatory Visit (HOSPITAL_COMMUNITY): Payer: Self-pay

## 2023-11-30 ENCOUNTER — Other Ambulatory Visit (HOSPITAL_COMMUNITY): Payer: Self-pay

## 2023-12-03 ENCOUNTER — Other Ambulatory Visit: Payer: Self-pay

## 2023-12-03 ENCOUNTER — Other Ambulatory Visit: Payer: Self-pay | Admitting: Nurse Practitioner

## 2023-12-03 ENCOUNTER — Other Ambulatory Visit (HOSPITAL_COMMUNITY): Payer: Self-pay

## 2023-12-03 DIAGNOSIS — J309 Allergic rhinitis, unspecified: Secondary | ICD-10-CM

## 2023-12-03 DIAGNOSIS — Z515 Encounter for palliative care: Secondary | ICD-10-CM

## 2023-12-03 DIAGNOSIS — G893 Neoplasm related pain (acute) (chronic): Secondary | ICD-10-CM

## 2023-12-03 DIAGNOSIS — C3490 Malignant neoplasm of unspecified part of unspecified bronchus or lung: Secondary | ICD-10-CM

## 2023-12-03 MED ORDER — OXYCODONE HCL ER 20 MG PO T12A
20.0000 mg | EXTENDED_RELEASE_TABLET | Freq: Two times a day (BID) | ORAL | 0 refills | Status: DC
  Filled 2023-12-11: qty 60, 30d supply, fill #0

## 2023-12-03 MED ORDER — FLUTICASONE PROPIONATE 50 MCG/ACT NA SUSP
1.0000 | Freq: Every day | NASAL | 2 refills | Status: DC
  Filled 2023-12-11: qty 16, 30d supply, fill #0
  Filled 2024-01-11: qty 16, 30d supply, fill #1
  Filled 2024-02-03: qty 16, 30d supply, fill #2

## 2023-12-03 MED ORDER — OXYCODONE-ACETAMINOPHEN 7.5-325 MG PO TABS
1.0000 | ORAL_TABLET | Freq: Four times a day (QID) | ORAL | 0 refills | Status: DC | PRN
Start: 2023-12-03 — End: 2024-01-01
  Filled 2023-12-11: qty 60, 15d supply, fill #0

## 2023-12-03 NOTE — Progress Notes (Signed)
 Histology and Location of Primary Cancer: Right Upper Lobe lung with hilar and subcarinal adenopathy with mets to bone.   Location(s) of Symptomatic Metastases: L5 vertebral body, T11, extensive disease in the posterior elements and L2, extensive disease of the left iliac bone and adjacent pelvis muscles.   Past/Anticipated chemotherapy by medical oncology, if any:  Dr. Arbutus Ped 11/27/2023 -Systemic chemotherapy with carboplatin for AUC of 5, Alimta 500 Mg/M2 and Keytruda 200 Mg IV every 3 weeks. First dose November 14, 2022. Status post 17 cycles. Starting from cycle #5 the patient is on maintenance treatment with Alimta and Keytruda every 3 weeks.  - Refer to radiation oncology for evaluation of potential radiation therapy for back pain.   Pain on a scale of 0-10 is: Lower back pain 7-8/10 and left hip pain, taking Oxycontin and Oxycodone   If Spine Met(s), symptoms, if any, include: Bowel/Bladder retention or incontinence (please describe): n/a Numbness or weakness in extremities (please describe): He has some tingling. Current Decadron regimen, if applicable: n/a   Ambulatory status? Walker? Wheelchair?: Ambulatory inside the house, uses a wheelchair for longer distance and outside his home.   SAFETY ISSUES: Prior radiation? No Pacemaker/ICD? No Possible current pregnancy? N/a Is the patient on methotrexate? No  Current Complaints / other details:

## 2023-12-04 ENCOUNTER — Encounter: Payer: Self-pay | Admitting: Radiation Oncology

## 2023-12-04 ENCOUNTER — Ambulatory Visit
Admission: RE | Admit: 2023-12-04 | Discharge: 2023-12-04 | Disposition: A | Discharge status: Home / Self Care | Source: Ambulatory Visit | Attending: Radiation Oncology | Admitting: Radiation Oncology

## 2023-12-04 DIAGNOSIS — C3490 Malignant neoplasm of unspecified part of unspecified bronchus or lung: Secondary | ICD-10-CM

## 2023-12-04 DIAGNOSIS — C7951 Secondary malignant neoplasm of bone: Secondary | ICD-10-CM

## 2023-12-05 ENCOUNTER — Other Ambulatory Visit: Payer: Self-pay

## 2023-12-05 ENCOUNTER — Ambulatory Visit
Admission: RE | Admit: 2023-12-05 | Discharge: 2023-12-05 | Disposition: A | Discharge status: Home / Self Care | Source: Ambulatory Visit | Attending: Radiation Oncology | Admitting: Radiation Oncology

## 2023-12-05 DIAGNOSIS — C3411 Malignant neoplasm of upper lobe, right bronchus or lung: Secondary | ICD-10-CM | POA: Insufficient documentation

## 2023-12-05 DIAGNOSIS — Z510 Encounter for antineoplastic radiation therapy: Secondary | ICD-10-CM | POA: Insufficient documentation

## 2023-12-05 DIAGNOSIS — C7951 Secondary malignant neoplasm of bone: Secondary | ICD-10-CM | POA: Insufficient documentation

## 2023-12-07 DIAGNOSIS — C3411 Malignant neoplasm of upper lobe, right bronchus or lung: Secondary | ICD-10-CM | POA: Diagnosis not present

## 2023-12-10 ENCOUNTER — Encounter (HOSPITAL_COMMUNITY): Payer: Self-pay

## 2023-12-10 ENCOUNTER — Other Ambulatory Visit (HOSPITAL_COMMUNITY): Payer: Self-pay

## 2023-12-11 ENCOUNTER — Encounter: Payer: Self-pay | Admitting: Internal Medicine

## 2023-12-11 ENCOUNTER — Other Ambulatory Visit (HOSPITAL_COMMUNITY): Payer: Self-pay

## 2023-12-12 ENCOUNTER — Other Ambulatory Visit: Payer: Self-pay

## 2023-12-12 ENCOUNTER — Ambulatory Visit
Admission: RE | Admit: 2023-12-12 | Discharge: 2023-12-12 | Discharge status: Home / Self Care | Source: Ambulatory Visit | Attending: Radiation Oncology

## 2023-12-12 DIAGNOSIS — C3411 Malignant neoplasm of upper lobe, right bronchus or lung: Secondary | ICD-10-CM | POA: Diagnosis not present

## 2023-12-12 LAB — RAD ONC ARIA SESSION SUMMARY
Course Elapsed Days: 0
Plan Fractions Treated to Date: 1
Plan Prescribed Dose Per Fraction: 3 Gy
Plan Total Fractions Prescribed: 10
Plan Total Prescribed Dose: 30 Gy
Reference Point Dosage Given to Date: 3 Gy
Reference Point Session Dosage Given: 3 Gy
Session Number: 1

## 2023-12-13 ENCOUNTER — Other Ambulatory Visit: Payer: Self-pay

## 2023-12-13 ENCOUNTER — Ambulatory Visit
Admission: RE | Admit: 2023-12-13 | Discharge: 2023-12-13 | Disposition: A | Discharge status: Home / Self Care | Source: Ambulatory Visit | Attending: Radiation Oncology

## 2023-12-13 DIAGNOSIS — C3411 Malignant neoplasm of upper lobe, right bronchus or lung: Secondary | ICD-10-CM | POA: Diagnosis not present

## 2023-12-13 LAB — RAD ONC ARIA SESSION SUMMARY
Course Elapsed Days: 1
Plan Fractions Treated to Date: 2
Plan Prescribed Dose Per Fraction: 3 Gy
Plan Total Fractions Prescribed: 10
Plan Total Prescribed Dose: 30 Gy
Reference Point Dosage Given to Date: 6 Gy
Reference Point Session Dosage Given: 3 Gy
Session Number: 2

## 2023-12-14 ENCOUNTER — Other Ambulatory Visit: Payer: Self-pay

## 2023-12-14 ENCOUNTER — Ambulatory Visit
Admission: RE | Admit: 2023-12-14 | Discharge: 2023-12-14 | Disposition: A | Discharge status: Home / Self Care | Source: Ambulatory Visit | Attending: Radiation Oncology | Admitting: Radiation Oncology

## 2023-12-14 DIAGNOSIS — C3411 Malignant neoplasm of upper lobe, right bronchus or lung: Secondary | ICD-10-CM | POA: Diagnosis not present

## 2023-12-14 LAB — RAD ONC ARIA SESSION SUMMARY
Course Elapsed Days: 2
Plan Fractions Treated to Date: 3
Plan Prescribed Dose Per Fraction: 3 Gy
Plan Total Fractions Prescribed: 10
Plan Total Prescribed Dose: 30 Gy
Reference Point Dosage Given to Date: 9 Gy
Reference Point Session Dosage Given: 3 Gy
Session Number: 3

## 2023-12-17 ENCOUNTER — Other Ambulatory Visit: Payer: Self-pay

## 2023-12-17 ENCOUNTER — Ambulatory Visit
Admission: RE | Admit: 2023-12-17 | Discharge: 2023-12-17 | Disposition: A | Discharge status: Home / Self Care | Source: Ambulatory Visit | Attending: Radiation Oncology | Admitting: Radiation Oncology

## 2023-12-17 DIAGNOSIS — C3411 Malignant neoplasm of upper lobe, right bronchus or lung: Secondary | ICD-10-CM | POA: Diagnosis not present

## 2023-12-17 LAB — RAD ONC ARIA SESSION SUMMARY
Course Elapsed Days: 5
Plan Fractions Treated to Date: 4
Plan Prescribed Dose Per Fraction: 3 Gy
Plan Total Fractions Prescribed: 10
Plan Total Prescribed Dose: 30 Gy
Reference Point Dosage Given to Date: 12 Gy
Reference Point Session Dosage Given: 3 Gy
Session Number: 4

## 2023-12-18 ENCOUNTER — Inpatient Hospital Stay (HOSPITAL_BASED_OUTPATIENT_CLINIC_OR_DEPARTMENT_OTHER): Payer: Medicaid Other | Admitting: Internal Medicine

## 2023-12-18 ENCOUNTER — Other Ambulatory Visit: Payer: Self-pay

## 2023-12-18 ENCOUNTER — Inpatient Hospital Stay: Payer: Medicaid Other

## 2023-12-18 ENCOUNTER — Encounter: Payer: Self-pay | Admitting: Nurse Practitioner

## 2023-12-18 ENCOUNTER — Inpatient Hospital Stay: Payer: Medicaid Other | Attending: Internal Medicine

## 2023-12-18 ENCOUNTER — Ambulatory Visit
Admission: RE | Admit: 2023-12-18 | Discharge: 2023-12-18 | Disposition: A | Discharge status: Home / Self Care | Source: Ambulatory Visit | Attending: Radiation Oncology | Admitting: Radiation Oncology

## 2023-12-18 ENCOUNTER — Inpatient Hospital Stay (HOSPITAL_BASED_OUTPATIENT_CLINIC_OR_DEPARTMENT_OTHER): Admitting: Nurse Practitioner

## 2023-12-18 ENCOUNTER — Other Ambulatory Visit (HOSPITAL_COMMUNITY): Payer: Self-pay

## 2023-12-18 VITALS — BP 116/72 | HR 74 | Temp 98.5°F | Resp 17 | Ht 66.0 in | Wt 201.8 lb

## 2023-12-18 DIAGNOSIS — I2510 Atherosclerotic heart disease of native coronary artery without angina pectoris: Secondary | ICD-10-CM | POA: Insufficient documentation

## 2023-12-18 DIAGNOSIS — E119 Type 2 diabetes mellitus without complications: Secondary | ICD-10-CM | POA: Insufficient documentation

## 2023-12-18 DIAGNOSIS — C3411 Malignant neoplasm of upper lobe, right bronchus or lung: Secondary | ICD-10-CM | POA: Diagnosis present

## 2023-12-18 DIAGNOSIS — R5383 Other fatigue: Secondary | ICD-10-CM | POA: Insufficient documentation

## 2023-12-18 DIAGNOSIS — Z5111 Encounter for antineoplastic chemotherapy: Secondary | ICD-10-CM | POA: Insufficient documentation

## 2023-12-18 DIAGNOSIS — C3490 Malignant neoplasm of unspecified part of unspecified bronchus or lung: Secondary | ICD-10-CM

## 2023-12-18 DIAGNOSIS — C7951 Secondary malignant neoplasm of bone: Secondary | ICD-10-CM | POA: Diagnosis not present

## 2023-12-18 DIAGNOSIS — F1290 Cannabis use, unspecified, uncomplicated: Secondary | ICD-10-CM | POA: Insufficient documentation

## 2023-12-18 DIAGNOSIS — E785 Hyperlipidemia, unspecified: Secondary | ICD-10-CM | POA: Insufficient documentation

## 2023-12-18 DIAGNOSIS — R0609 Other forms of dyspnea: Secondary | ICD-10-CM | POA: Insufficient documentation

## 2023-12-18 DIAGNOSIS — Z888 Allergy status to other drugs, medicaments and biological substances status: Secondary | ICD-10-CM | POA: Insufficient documentation

## 2023-12-18 DIAGNOSIS — Z515 Encounter for palliative care: Secondary | ICD-10-CM | POA: Diagnosis not present

## 2023-12-18 DIAGNOSIS — Z7901 Long term (current) use of anticoagulants: Secondary | ICD-10-CM | POA: Insufficient documentation

## 2023-12-18 DIAGNOSIS — Z510 Encounter for antineoplastic radiation therapy: Secondary | ICD-10-CM | POA: Insufficient documentation

## 2023-12-18 DIAGNOSIS — N62 Hypertrophy of breast: Secondary | ICD-10-CM | POA: Insufficient documentation

## 2023-12-18 DIAGNOSIS — M792 Neuralgia and neuritis, unspecified: Secondary | ICD-10-CM | POA: Diagnosis not present

## 2023-12-18 DIAGNOSIS — F17210 Nicotine dependence, cigarettes, uncomplicated: Secondary | ICD-10-CM | POA: Insufficient documentation

## 2023-12-18 DIAGNOSIS — Z5112 Encounter for antineoplastic immunotherapy: Secondary | ICD-10-CM | POA: Insufficient documentation

## 2023-12-18 DIAGNOSIS — Z79631 Long term (current) use of antimetabolite agent: Secondary | ICD-10-CM | POA: Insufficient documentation

## 2023-12-18 DIAGNOSIS — Z880 Allergy status to penicillin: Secondary | ICD-10-CM | POA: Insufficient documentation

## 2023-12-18 DIAGNOSIS — Z86718 Personal history of other venous thrombosis and embolism: Secondary | ICD-10-CM | POA: Insufficient documentation

## 2023-12-18 DIAGNOSIS — Z79899 Other long term (current) drug therapy: Secondary | ICD-10-CM | POA: Insufficient documentation

## 2023-12-18 DIAGNOSIS — Y842 Radiological procedure and radiotherapy as the cause of abnormal reaction of the patient, or of later complication, without mention of misadventure at the time of the procedure: Secondary | ICD-10-CM | POA: Insufficient documentation

## 2023-12-18 DIAGNOSIS — Z923 Personal history of irradiation: Secondary | ICD-10-CM | POA: Insufficient documentation

## 2023-12-18 DIAGNOSIS — I700 Atherosclerosis of aorta: Secondary | ICD-10-CM | POA: Insufficient documentation

## 2023-12-18 DIAGNOSIS — G893 Neoplasm related pain (acute) (chronic): Secondary | ICD-10-CM | POA: Insufficient documentation

## 2023-12-18 DIAGNOSIS — J309 Allergic rhinitis, unspecified: Secondary | ICD-10-CM | POA: Insufficient documentation

## 2023-12-18 DIAGNOSIS — Z95828 Presence of other vascular implants and grafts: Secondary | ICD-10-CM

## 2023-12-18 DIAGNOSIS — I10 Essential (primary) hypertension: Secondary | ICD-10-CM | POA: Insufficient documentation

## 2023-12-18 DIAGNOSIS — J449 Chronic obstructive pulmonary disease, unspecified: Secondary | ICD-10-CM | POA: Insufficient documentation

## 2023-12-18 DIAGNOSIS — Z886 Allergy status to analgesic agent status: Secondary | ICD-10-CM | POA: Insufficient documentation

## 2023-12-18 DIAGNOSIS — Z79620 Long term (current) use of immunosuppressive biologic: Secondary | ICD-10-CM | POA: Insufficient documentation

## 2023-12-18 LAB — RAD ONC ARIA SESSION SUMMARY
Course Elapsed Days: 6
Plan Fractions Treated to Date: 5
Plan Prescribed Dose Per Fraction: 3 Gy
Plan Total Fractions Prescribed: 10
Plan Total Prescribed Dose: 30 Gy
Reference Point Dosage Given to Date: 15 Gy
Reference Point Session Dosage Given: 3 Gy
Session Number: 5

## 2023-12-18 LAB — CMP (CANCER CENTER ONLY)
ALT: 9 U/L (ref 0–44)
AST: 13 U/L — ABNORMAL LOW (ref 15–41)
Albumin: 4 g/dL (ref 3.5–5.0)
Alkaline Phosphatase: 66 U/L (ref 38–126)
Anion gap: 3 — ABNORMAL LOW (ref 5–15)
BUN: 15 mg/dL (ref 6–20)
CO2: 39 mmol/L — ABNORMAL HIGH (ref 22–32)
Calcium: 9.2 mg/dL (ref 8.9–10.3)
Chloride: 99 mmol/L (ref 98–111)
Creatinine: 0.87 mg/dL (ref 0.61–1.24)
GFR, Estimated: >60 mL/min (ref >60)
Glucose, Bld: 95 mg/dL (ref 70–99)
Potassium: 4.4 mmol/L (ref 3.5–5.1)
Sodium: 141 mmol/L (ref 135–145)
Total Bilirubin: 0.8 mg/dL (ref 0.0–1.2)
Total Protein: 6.8 g/dL (ref 6.5–8.1)

## 2023-12-18 LAB — CBC WITH DIFFERENTIAL (CANCER CENTER ONLY)
Abs Immature Granulocytes: 0.01 10*3/uL (ref 0.00–0.07)
Basophils Absolute: 0 10*3/uL (ref 0.0–0.1)
Basophils Relative: 1 %
Eosinophils Absolute: 0.4 10*3/uL (ref 0.0–0.5)
Eosinophils Relative: 8 %
HCT: 40.5 % (ref 39.0–52.0)
Hemoglobin: 12.6 g/dL — ABNORMAL LOW (ref 13.0–17.0)
Immature Granulocytes: 0 %
Lymphocytes Relative: 10 %
Lymphs Abs: 0.6 10*3/uL — ABNORMAL LOW (ref 0.7–4.0)
MCH: 30.3 pg (ref 26.0–34.0)
MCHC: 31.1 g/dL (ref 30.0–36.0)
MCV: 97.4 fL (ref 80.0–100.0)
Monocytes Absolute: 0.4 10*3/uL (ref 0.1–1.0)
Monocytes Relative: 7 %
Neutro Abs: 4.1 10*3/uL (ref 1.7–7.7)
Neutrophils Relative %: 74 %
Platelet Count: 175 10*3/uL (ref 150–400)
RBC: 4.16 MIL/uL — ABNORMAL LOW (ref 4.22–5.81)
RDW: 14.8 % (ref 11.5–15.5)
WBC Count: 5.5 10*3/uL (ref 4.0–10.5)
nRBC: 0 % (ref 0.0–0.2)

## 2023-12-18 MED ORDER — SODIUM CHLORIDE 0.9 % IV SOLN: Freq: Once | INTRAVENOUS | Status: AC

## 2023-12-18 MED ORDER — HEPARIN SOD (PORK) LOCK FLUSH 100 UNIT/ML IV SOLN
500.0000 [IU] | Freq: Once | INTRAVENOUS | Status: AC | PRN
  Administered 2023-12-18: 500 [IU]

## 2023-12-18 MED ORDER — AZELASTINE HCL 137 MCG/SPRAY NA SOLN
2.0000 | Freq: Two times a day (BID) | NASAL | 12 refills | Status: AC
  Filled 2023-12-18: qty 30, 34d supply, fill #0
  Filled 2024-01-23: qty 30, 34d supply, fill #1
  Filled 2024-03-06: qty 30, 34d supply, fill #2
  Filled 2024-04-14: qty 30, 34d supply, fill #3
  Filled 2024-05-17: qty 30, 34d supply, fill #4
  Filled 2024-06-21: qty 30, 34d supply, fill #5
  Filled 2024-07-23: qty 30, 34d supply, fill #6
  Filled 2024-09-03: qty 30, 34d supply, fill #7
  Filled 2024-10-03: qty 30, 34d supply, fill #8

## 2023-12-18 MED ORDER — PROCHLORPERAZINE MALEATE 10 MG PO TABS: 10.0000 mg | ORAL_TABLET | Freq: Once | ORAL | Status: DC

## 2023-12-18 MED ORDER — SODIUM CHLORIDE 0.9 % IV SOLN
200.0000 mg | Freq: Once | INTRAVENOUS | Status: AC
  Administered 2023-12-18: 200 mg via INTRAVENOUS
  Filled 2023-12-18: qty 200

## 2023-12-18 MED ORDER — SODIUM CHLORIDE 0.9% FLUSH
3.0000 mL | Freq: Once | INTRAVENOUS | Status: AC | PRN
  Administered 2023-12-18: 3 mL

## 2023-12-18 MED ORDER — SODIUM CHLORIDE 0.9% FLUSH
10.0000 mL | INTRAVENOUS | Status: DC | PRN
  Administered 2023-12-18: 10 mL

## 2023-12-18 MED ORDER — SODIUM CHLORIDE 0.9 % IV SOLN
500.0000 mg/m2 | Freq: Once | INTRAVENOUS | Status: AC
  Administered 2023-12-18: 1000 mg via INTRAVENOUS
  Filled 2023-12-18: qty 40

## 2023-12-18 NOTE — Progress Notes (Signed)
 Palliative Medicine [facility]  [phone] Fax:([phone]   Name: Maxwell Grant Date: 12/18/2023 MRN: [id number]  DOB: 04/12/1966  Patient Care Team: Tracey Harries, MD as PCP - General (Family Medicine)    INTERVAL HISTORY: Maxwell Grant is a 58 y.o. male with oncologic medical history including new diagnosed non-small cell lung cancer (10/2022), as well as a history of COPD, diabetes mellitus, hypertension, dyslipidemia as well as history of osteomyelitis of the back.  Palliative ask to see for symptom and pain management and goals of care.   SOCIAL HISTORY:     reports that he has been smoking cigarettes. He has never used smokeless tobacco. He reports current alcohol use of about 1.0 standard drink of alcohol per week. He reports current drug use. Drug: Marijuana.  ADVANCE DIRECTIVES:  None on file   CODE STATUS: Full code  PAST MEDICAL HISTORY: Past Medical History:  Diagnosis Date   CHF (congestive heart failure) (HCC)    COPD (chronic obstructive pulmonary disease) (HCC)    Diabetes mellitus without complication (HCC)    Dyspnea    Hypertension    Lung cancer (HCC) 10/2022   Right leg DVT (HCC) 12/21/2022    ALLERGIES:  is allergic to statins, aspirin, flexeril [cyclobenzaprine], and penicillins.  MEDICATIONS:  Current Outpatient Medications  Medication Sig Dispense Refill   Azelastine HCl 137 MCG/SPRAY SOLN Place 2 sprays into nostrils 2 (two) times daily. 30 mL 12   Accu-Chek FastClix Lancets MISC Use to test blood sugar 4 times a day 102 each 0   albuterol (PROVENTIL) (2.5 MG/3ML) 0.083% nebulizer solution Inhale 3 mL (2.5 mg total) by nebulization every 6 (six) hours as needed for wheezing. 360 mL 3   albuterol (VENTOLIN HFA) 108 (90 Base) MCG/ACT inhaler Inhale 1-2 puffs into the lungs every 6 (six) hours as needed for wheezing or shortness of breath. 18 g 1   albuterol (VENTOLIN HFA) 108 (90 Base) MCG/ACT inhaler Inhale 2  puffs every 6 (six) hours as needed for wheezing. 54 g 3   apixaban (ELIQUIS) 5 MG TABS tablet Take 1 tablet (5 mg total) by mouth 2 (two) times daily. 60 tablet 4   benzonatate (TESSALON) 100 MG capsule Take 1 capsule (100 mg total) by mouth 3 (three) times daily as needed. 30 capsule 2   fluticasone (FLONASE) 50 MCG/ACT nasal spray Place 1 spray into both nostrils daily. 16 g 2   Fluticasone-Umeclidin-Vilant (TRELEGY ELLIPTA) 100-62.5-25 MCG/ACT AEPB Inhale 1 puff into the lungs daily in the afternoon. 60 each 5   folic acid (FOLVITE) 1 MG tablet Take 1 tablet (1 mg total) by mouth daily. 90 tablet 1   furosemide (LASIX) 20 MG tablet Take 1 tablet (20 mg) by mouth daily as needed. 7 tablet 0   gabapentin (NEURONTIN) 300 MG capsule Take 2 capsules (600mg ) in the morning, 1 capsule (300 mg ) midday, and 2 capsules at bedtime (600mg ). 150 capsule 2   glimepiride (AMARYL) 2 MG tablet Take 2 mg by mouth in the morning.     lidocaine-prilocaine (EMLA) cream Apply to [facility] Prior to Access as needed 30 g 2   meloxicam (MOBIC) 7.5 MG tablet Take 1 tablet (7.5 mg total) by mouth 2 (two) times daily. 60 tablet 3   ondansetron (ZOFRAN) 8 MG tablet Take 1 tablet (8 mg) by mouth every 8 hours as needed for nausea or vomiting. 30 tablet 2   OVER THE COUNTER MEDICATION Take 1 tablet by  mouth daily as needed (constipation). " Stool softener     oxyCODONE (OXYCONTIN) 20 mg 12 hr tablet Take 1 tablet (20 mg total) by mouth every 12 (twelve) hours. 60 tablet 0   oxyCODONE-acetaminophen (PERCOCET) 7.5-325 MG tablet Take 1 tablet by mouth every 6 (six) hours as needed for severe pain (pain score 7-10). 60 tablet 0   prochlorperazine (COMPAZINE) 10 MG tablet Take 1 tablet (10 mg total) by mouth every 6 (six) hours as needed for nausea or vomiting. 30 tablet 0   TRELEGY ELLIPTA 100-62.5-25 MCG/ACT AEPB Inhale 1 puff once daily. 180 each 3   No current facility-administered medications for this visit.    Facility-Administered Medications Ordered in Other Visits  Medication Dose Route Frequency Provider Last Rate Last Admin   prochlorperazine (COMPAZINE) tablet 10 mg  10 mg Oral Once Si Gaul, MD       sodium chloride flush (NS) 0.9 % injection 10 mL  10 mL Intracatheter PRN Si Gaul, MD   10 mL at 12/18/23 1142    VITAL SIGNS: There were no vitals taken for this visit. There were no vitals filed for this visit.  Estimated body mass index is 32.57 kg/m as calculated from the following:   Height as of an earlier encounter on 12/18/23: 5\' 6"  (1.676 m).   Weight as of an earlier encounter on 12/18/23: 201 lb 12.8 oz (91.5 kg).   PERFORMANCE STATUS (ECOG) : 1 - Symptomatic but completely ambulatory   Physical Exam General: NAD Cardiovascular: regular rate and rhythm Pulmonary: normal breathing pattern Extremities: no edema, no joint deformities Skin: no rashes Neurological: AAO x3  IMPRESSION: Discussed the use of AI scribe software for clinical note transcription with the patient, who gave verbal consent to proceed.  History of Present Illness Maxwell Grant "Maxwell Grant" is a 58 year old male who presents for symptom management follow-up. Denies concerns of nausea, vomiting, constipation, or diarrhea. Is remaining as active as possible. Appetite is good.   Reports some challenges recently with seasonal allergies. We discussed daily use of Zyrtec in addition to nasal spray.   He reports his pain is well controlled on current regimen which includes: OxyContin 20 mg every 12 hours and Percocet 7.5/325 mg every 6 hours as needed for breakthrough pain.  Gabapentin 300 mg 3 times daily.  No adjustments at this time.  We will continue to closely monitor and support.  I discussed the importance of continued conversation with family and their medical providers regarding overall plan of care and treatment options, ensuring decisions are within the context of the patients values and  GOCs.  Assessment & Plan Pain Management His current regimen is effective. - Ensure timely access to pain medication as per current regimen. -Continue OxyContin 20 mg every 12 hours -Continue oxycodone/acetaminophen 7.5/325 mg every 6 hours as needed  -Continue gabapentin 300 mg twice daily and 600 mg at bedtime.  For breakthrough pain.  Not requiring around-the-clock.  Allergic Rhinitis He finds azelastine allergy nasal spray more effective than Flonase for managing his symptoms. It is used as needed. - Send prescription for azelastine allergy nasal spray to the pharmacy. -Encouraged continued use of daily Zyrtec.  I will plan to see patient back in 6-8 weeks.  Sooner if needed.  Patient expressed understanding and was in agreement with this plan. He also understands that He can call the clinic at any time with any questions, concerns, or complaints.   Any controlled substances utilized were prescribed in the  context of palliative care. PDMP has been reviewed.   Visit consisted of counseling and education dealing with the complex and emotionally intense issues of symptom management and palliative care in the setting of serious and potentially life-threatening illness.  [name], AGPCNP-BC  Palliative Medicine Team/[facility]

## 2023-12-18 NOTE — Progress Notes (Signed)
 [facility] [phone]   Fax:([phone]) [phone]  OFFICE PROGRESS NOTE  Tracey Harries, MD [address] [place] [address]  DIAGNOSIS:  1) Stage IV (T3, N2, M1 C) non-small cell lung cancer, adenocarcinoma presented with large right upper lobe lung mass in addition to right hilar and subcarinal lymphadenopathy in addition to multiple metastatic bone lesions involving the pelvis as well as the lower thoracic and lumbar spines in addition to metastatic disease to the pelvic muscles diagnosed in February 2024.  2) deep venous thrombosis of the right lower extremity diagnosed on December 18, 2022  Detected Alteration(s) / Biomarker(s) Associated FDA-approved therapies Clinical Trial Availability% cfDNA or Amplification  KRAS G12C approved by FDA Adagrasib, Sotorasib Yes 29.3%  CDK4 Amplification None Yes High (+++)Plasma Copy Number: 8.0  PD-L1 expression 98%  PRIOR THERAPY: None  CURRENT THERAPY:  1) Systemic chemotherapy with carboplatin for AUC of 5, Alimta 500 Mg/M2 and Keytruda 200 Mg IV every 3 weeks.  First dose November 14, 2022.  Status post 17 cycles.  Starting from cycle #5 the patient is on maintenance treatment with Alimta and Keytruda every 3 weeks. 2) Eliquis 10 mg p.o. twice daily for 7 days followed by 5 mg p.o. twice daily.  First dose 12/26/2022.  INTERVAL HISTORY: Kolden Dupee 58 y.o. male returns to the clinic today for follow-up visit after by his sister. Discussed the use of AI scribe software for clinical note transcription with the patient, who gave verbal consent to proceed.  History of Present Illness   Raymon Schlarb "[name]" is a 58 year old male with cancer who presents for evaluation before starting cycle number twenty of chemotherapy. He is accompanied by his sister.  He is currently undergoing chemotherapy with Alimta and Keytruda every three weeks, having completed nineteen cycles. He is here for evaluation  before starting cycle number twenty.  He has developed soreness and a knot in his right breast, located under the nipple, noticed about two months ago. Initially, it was not a lump, but it started to form a lump about a month ago. The condition is described as diffuse enlargement of the breast tissue rather than a distinct lump.  His last scan, conducted last month, showed no concerning findings. He inquires about the effects of radiation and when they might be visible on future scans.  He occasionally smokes marijuana, which could potentially cause issues due to impurities that might contain estrogen. He denies taking any over-the-counter supplements or medications that could contribute to his symptoms.       MEDICAL HISTORY: Past Medical History:  Diagnosis Date   CHF (congestive heart failure) (HCC)    COPD (chronic obstructive pulmonary disease) (HCC)    Diabetes mellitus without complication (HCC)    Dyspnea    Hypertension    Lung cancer (HCC) 10/2022   Right leg DVT (HCC) 12/21/2022    ALLERGIES:  is allergic to statins, aspirin, flexeril [cyclobenzaprine], and penicillins.  MEDICATIONS:  Current Outpatient Medications  Medication Sig Dispense Refill   Accu-Chek FastClix Lancets MISC Use to test blood sugar 4 times a day 102 each 0   albuterol (PROVENTIL) (2.5 MG/3ML) 0.083% nebulizer solution Inhale 3 mL (2.5 mg total) by nebulization every 6 (six) hours as needed for wheezing. 360 mL 3   albuterol (VENTOLIN HFA) 108 (90 Base) MCG/ACT inhaler Inhale 1-2 puffs into the lungs every 6 (six) hours as needed for wheezing or shortness of breath.  18 g 1   albuterol (VENTOLIN HFA) 108 (90 Base) MCG/ACT inhaler Inhale 2 puffs every 6 (six) hours as needed for wheezing. 54 g 3   apixaban (ELIQUIS) 5 MG TABS tablet Take 1 tablet (5 mg total) by mouth 2 (two) times daily. 60 tablet 4   benzonatate (TESSALON) 100 MG capsule Take 1 capsule (100 mg total) by mouth 3 (three) times daily as  needed. 30 capsule 2   fluticasone (FLONASE) 50 MCG/ACT nasal spray Place 1 spray into both nostrils daily. 16 g 2   Fluticasone-Umeclidin-Vilant (TRELEGY ELLIPTA) 100-62.5-25 MCG/ACT AEPB Inhale 1 puff into the lungs daily in the afternoon. 60 each 5   folic acid (FOLVITE) 1 MG tablet Take 1 tablet (1 mg total) by mouth daily. 90 tablet 1   furosemide (LASIX) 20 MG tablet Take 1 tablet (20 mg) by mouth daily as needed. 7 tablet 0   gabapentin (NEURONTIN) 300 MG capsule Take 2 capsules (600mg ) in the morning, 1 capsule (300 mg ) midday, and 2 capsules at bedtime (600mg ). 150 capsule 2   glimepiride (AMARYL) 2 MG tablet Take 2 mg by mouth in the morning.     lidocaine-prilocaine (EMLA) cream Apply to [facility] Prior to Access as needed 30 g 2   meloxicam (MOBIC) 7.5 MG tablet Take 1 tablet (7.5 mg total) by mouth 2 (two) times daily. 60 tablet 3   ondansetron (ZOFRAN) 8 MG tablet Take 1 tablet (8 mg) by mouth every 8 hours as needed for nausea or vomiting. 30 tablet 2   OVER THE COUNTER MEDICATION Take 1 tablet by mouth daily as needed (constipation). " Stool softener     oxyCODONE (OXYCONTIN) 20 mg 12 hr tablet Take 1 tablet (20 mg total) by mouth every 12 (twelve) hours. 60 tablet 0   oxyCODONE-acetaminophen (PERCOCET) 7.5-325 MG tablet Take 1 tablet by mouth every 6 (six) hours as needed for severe pain (pain score 7-10). 60 tablet 0   prochlorperazine (COMPAZINE) 10 MG tablet Take 1 tablet (10 mg total) by mouth every 6 (six) hours as needed for nausea or vomiting. 30 tablet 0   TRELEGY ELLIPTA 100-62.5-25 MCG/ACT AEPB Inhale 1 puff once daily. 180 each 3   No current facility-administered medications for this visit.    SURGICAL HISTORY:  Past Surgical History:  Procedure Laterality Date   BRONCHIAL NEEDLE ASPIRATION BIOPSY  10/30/2022   Procedure: BRONCHIAL NEEDLE ASPIRATION BIOPSIES;  Surgeon: Leslye Peer, MD;  Location: [facility] ENDOSCOPY;  Service: Cardiopulmonary;;   IR IMAGING GUIDED PORT  INSERTION  11/17/2022   NO PAST SURGERIES     RADIOLOGY WITH ANESTHESIA  10/30/2022   Procedure: MRI WITH ANESTHESIA W/WO CONTRAST;  Surgeon: Leslye Peer, MD;  Location: [facility] ENDOSCOPY;  Service: Cardiopulmonary;;   VIDEO BRONCHOSCOPY WITH ENDOBRONCHIAL ULTRASOUND Right 10/30/2022   Procedure: VIDEO BRONCHOSCOPY WITH ENDOBRONCHIAL ULTRASOUND;  Surgeon: Leslye Peer, MD;  Location: [facility] ENDOSCOPY;  Service: Cardiopulmonary;  Laterality: Right;    REVIEW OF SYSTEMS:  A comprehensive review of systems was negative except for: Constitutional: positive for fatigue   PHYSICAL EXAMINATION: General appearance: alert, cooperative, fatigued, and no distress Head: Normocephalic, without obvious abnormality, atraumatic Neck: no adenopathy, no JVD, supple, symmetrical, trachea midline, and thyroid not enlarged, symmetric, no tenderness/mass/nodules Lymph nodes: Cervical, supraclavicular, and axillary nodes normal. Resp: clear to auscultation bilaterally Back: symmetric, no curvature. ROM normal. No CVA tenderness. Cardio: regular rate and rhythm, S1, S2 normal, no murmur, click, rub or gallop GI: soft, non-tender; bowel  sounds normal; no masses,  no organomegaly Extremities: extremities normal, atraumatic, no cyanosis or edema  ECOG PERFORMANCE STATUS: 1 - Symptomatic but completely ambulatory  Blood pressure 116/72, pulse 74, temperature 98.5 F (36.9 C), resp. rate 17, height 5\' 6"  (1.676 m), weight 201 lb 12.8 oz (91.5 kg), SpO2 96%.  LABORATORY DATA: Lab Results  Component Value Date   WBC 5.5 12/18/2023   HGB 12.6 (L) 12/18/2023   HCT 40.5 12/18/2023   MCV 97.4 12/18/2023   PLT 175 12/18/2023      Chemistry      Component Value Date/Time   NA 141 12/18/2023 0831   K 4.4 12/18/2023 0831   CL 99 12/18/2023 0831   CO2 39 (H) 12/18/2023 0831   BUN 15 12/18/2023 0831   CREATININE 0.87 12/18/2023 0831      Component Value Date/Time   CALCIUM 9.2 12/18/2023 0831   ALKPHOS 66 12/18/2023  0831   AST 13 (L) 12/18/2023 0831   ALT 9 12/18/2023 0831   BILITOT 0.8 12/18/2023 0831       RADIOGRAPHIC STUDIES: CT CHEST ABDOMEN PELVIS W CONTRAST Result Date: 11/26/2023 CLINICAL DATA:  Non-small cell lung cancer restaging * Tracking Code: BO * EXAM: CT CHEST, ABDOMEN, AND PELVIS WITH CONTRAST TECHNIQUE: Multidetector CT imaging of the chest, abdomen and pelvis was performed following the standard protocol during bolus administration of intravenous contrast. RADIATION DOSE REDUCTION: This exam was performed according to the departmental dose-optimization program which includes automated exposure control, adjustment of the mA and/or kV according to patient size and/or use of iterative reconstruction technique. CONTRAST:  OMNIPAQUE IOHEXOL 300 MG/ML  SOLN COMPARISON:  09/18/2023 FINDINGS: CT CHEST FINDINGS Cardiovascular: Right chest port catheter. Aortic atherosclerosis. Normal heart size. Three-vessel coronary artery calcifications no pericardial effusion. Mediastinum/Nodes: No enlarged mediastinal, hilar, or axillary lymph nodes. Thyroid gland, trachea, and esophagus demonstrate no significant findings. Lungs/Pleura: Moderate centrilobular emphysema and diffuse bilateral bronchial wall thickening. Spiculated mass in the central right upper lobe measuring 2.6 x 2.5 cm, unchanged (series 4, image 65). No pleural effusion or pneumothorax. Musculoskeletal: Bilateral gynecomastia.  No acute osseous findings. CT ABDOMEN PELVIS FINDINGS Hepatobiliary: No solid liver abnormality is seen. No gallstones, gallbladder wall thickening, or biliary dilatation. Pancreas: Unremarkable. No pancreatic ductal dilatation or surrounding inflammatory changes. Spleen: Normal in size without significant abnormality. Adrenals/Urinary Tract: Adrenal glands are unremarkable. Kidneys are normal, without renal calculi, solid lesion, or hydronephrosis. Bladder is unremarkable. Stomach/Bowel: Stomach is within normal limits.  Appendix appears normal. No evidence of bowel wall thickening, distention, or inflammatory changes. Vascular/Lymphatic: Severe aortic atherosclerosis. No enlarged abdominal or pelvic lymph nodes. Reproductive: No mass or other abnormality. Other: No abdominal wall hernia or abnormality. No ascites. Musculoskeletal: No acute osseous findings. Unchanged mixed lytic and sclerotic osseous metastatic disease again including sclerotic lesions and pathologic fractures of the manubrium, T8 vertebral body, and left ilium. IMPRESSION: 1. Unchanged spiculated mass in the central right upper lobe. 2. Unchanged mixed lytic and sclerotic osseous metastatic disease again including sclerotic lesions and pathologic fractures of the manubrium, T8 vertebral body, and left ilium. 3. No evidence of lymphadenopathy or soft tissue metastatic disease in the chest, abdomen, or pelvis. 4. Emphysema and diffuse bilateral bronchial wall thickening. 5. Coronary artery disease. Aortic Atherosclerosis (ICD10-I70.0) and Emphysema (ICD10-J43.9). Electronically Signed   By: Jearld Lesch M.D.   On: 11/26/2023 21:51     ASSESSMENT AND PLAN: This is a very pleasant 58 years old white male with stage IV (T3,  N2, M1 C) non-small cell lung cancer, adenocarcinoma presented with large right upper lobe lung mass in addition to right hilar and subcarinal lymphadenopathy in addition to multiple metastatic bone lesions involving the pelvis as well as the lower thoracic and lumbar spines in addition to metastatic disease to the pelvic muscles diagnosed in February 2024.  Molecular studies showed positive KRAS G12C mutation and PD-L1 expression of 98%. The patient is here today to start the first cycle of systemic chemotherapy with carboplatin for AUC of 5, Alimta 500 Mg/M2 and Keytruda 200 Mg IV every 3 weeks.  First dose November 14, 2022.  Status post 19 cycles.  Starting from cycle #5 the patient is on maintenance treatment with Alimta and Keytruda  every 3 weeks.  He has been tolerating this treatment fairly well.    Metastatic adenocarcinoma A 58 year old male with metastatic adenocarcinoma, initially diagnosed in February 2024, undergoing treatment with Alimta (pemetrexed) and Keytruda (pembrolizumab) every three weeks, following four cycles of carboplatin, Alimta, and Keytruda. He has completed nineteen cycles. Last month's scan showed no new concerning findings, indicating well-managed disease. The treatment plan is to continue with the current regimen as it appears effective. Potential side effects, such as gynecomastia, were discussed, and he was advised to monitor for changes. - Proceed with cycle number twenty of Alimta and Keytruda.  Radiation therapy follow-up Currently undergoing radiation therapy. The effects on bone will be evaluated in future scans post-treatment. Radiation can address gynecomastia, but it is not necessary at this time. - Continue with current radiation therapy. - Evaluate the effects of radiation in future scans.  Gynecomastia Reports a sore knot under the right nipple, noticed two months ago, suspected to be gynecomastia, potentially related to medication or marijuana use. The condition is not currently concerning, but further evaluation with an ultrasound will be considered if it enlarges. Advised to avoid marijuana and over-the-counter supplements that could exacerbate the condition. - Monitor the size of the breast lump. - If the lump enlarges, perform an ultrasound for further evaluation. - Advise against marijuana and certain supplements.   The patient was advised to call immediately if he has any concerning symptoms in the interval. The patient voices understanding of current disease status and treatment options and is in agreement with the current care plan.  All questions were answered. The patient knows to call the clinic with any problems, questions or concerns. We can certainly see the patient much  sooner if necessary.  The total time spent in the appointment was 20 minutes.  Disclaimer: This note was dictated with voice recognition software. Similar sounding words can inadvertently be transcribed and may not be corrected upon review.

## 2023-12-18 NOTE — Patient Instructions (Signed)
 [facility] - A DEPT OF [facility]  Discharge Instructions: Thank you for choosing [facility] to provide your oncology and hematology care.   If you have a lab appointment with the Cancer Center, please go directly to the Cancer Center and check in at the registration area.   Wear comfortable clothing and clothing appropriate for easy access to any Portacath or PICC line.   We strive to give you quality time with your provider. You may need to reschedule your appointment if you arrive late (15 or more minutes).  Arriving late affects you and other patients whose appointments are after yours.  Also, if you miss three or more appointments without notifying the office, you may be dismissed from the clinic at the provider's discretion.      For prescription refill requests, have your pharmacy contact our office and allow 72 hours for refills to be completed.    Today you received the following chemotherapy and/or immunotherapy agents: Keytruda/Alimta      To help prevent nausea and vomiting after your treatment, we encourage you to take your nausea medication as directed.  BELOW ARE SYMPTOMS THAT SHOULD BE REPORTED IMMEDIATELY: *FEVER GREATER THAN 100.4 F (38 C) OR HIGHER *CHILLS OR SWEATING *NAUSEA AND VOMITING THAT IS NOT CONTROLLED WITH YOUR NAUSEA MEDICATION *UNUSUAL SHORTNESS OF BREATH *UNUSUAL BRUISING OR BLEEDING *URINARY PROBLEMS (pain or burning when urinating, or frequent urination) *BOWEL PROBLEMS (unusual diarrhea, constipation, pain near the anus) TENDERNESS IN MOUTH AND THROAT WITH OR WITHOUT PRESENCE OF ULCERS (sore throat, sores in mouth, or a toothache) UNUSUAL RASH, SWELLING OR PAIN  UNUSUAL VAGINAL DISCHARGE OR ITCHING   Items with * indicate a potential emergency and should be followed up as soon as possible or go to the Emergency Department if any problems should occur.  Please show the CHEMOTHERAPY ALERT CARD or  IMMUNOTHERAPY ALERT CARD at check-in to the Emergency Department and triage nurse.  Should you have questions after your visit or need to cancel or reschedule your appointment, please contact [facility] - A DEPT OF [facility]  Dept: [phone]  and follow the prompts.  Office hours are 8:00 a.m. to 4:30 p.m. Monday - Friday. Please note that voicemails left after 4:00 p.m. may not be returned until the following business day.  We are closed weekends and major holidays. You have access to a nurse at all times for urgent questions. Please call the main number to the clinic Dept: [phone] and follow the prompts.   For any non-urgent questions, you may also contact your provider using MyChart. We now offer e-Visits for anyone 6 and older to request care online for non-urgent symptoms. For details visit [email or web address].   Also download the MyChart app! Go to the app store, search "MyChart", open the app, select [place], and log in with your MyChart username and password.

## 2023-12-19 ENCOUNTER — Ambulatory Visit
Admission: RE | Admit: 2023-12-19 | Discharge: 2023-12-19 | Disposition: A | Discharge status: Home / Self Care | Source: Ambulatory Visit | Attending: Radiation Oncology | Admitting: Radiation Oncology

## 2023-12-19 ENCOUNTER — Other Ambulatory Visit: Payer: Self-pay

## 2023-12-19 DIAGNOSIS — C3411 Malignant neoplasm of upper lobe, right bronchus or lung: Secondary | ICD-10-CM | POA: Diagnosis not present

## 2023-12-19 LAB — RAD ONC ARIA SESSION SUMMARY
Course Elapsed Days: 7
Plan Fractions Treated to Date: 6
Plan Prescribed Dose Per Fraction: 3 Gy
Plan Total Fractions Prescribed: 10
Plan Total Prescribed Dose: 30 Gy
Reference Point Dosage Given to Date: 18 Gy
Reference Point Session Dosage Given: 3 Gy
Session Number: 6

## 2023-12-20 ENCOUNTER — Other Ambulatory Visit: Payer: Self-pay

## 2023-12-20 ENCOUNTER — Ambulatory Visit
Admission: RE | Admit: 2023-12-20 | Discharge: 2023-12-20 | Disposition: A | Discharge status: Home / Self Care | Source: Ambulatory Visit | Attending: Radiation Oncology

## 2023-12-20 DIAGNOSIS — C3411 Malignant neoplasm of upper lobe, right bronchus or lung: Secondary | ICD-10-CM | POA: Diagnosis not present

## 2023-12-20 LAB — RAD ONC ARIA SESSION SUMMARY
Course Elapsed Days: 8
Plan Fractions Treated to Date: 7
Plan Prescribed Dose Per Fraction: 3 Gy
Plan Total Fractions Prescribed: 10
Plan Total Prescribed Dose: 30 Gy
Reference Point Dosage Given to Date: 21 Gy
Reference Point Session Dosage Given: 3 Gy
Session Number: 7

## 2023-12-21 ENCOUNTER — Ambulatory Visit
Admission: RE | Admit: 2023-12-21 | Discharge: 2023-12-21 | Disposition: A | Discharge status: Home / Self Care | Source: Ambulatory Visit | Attending: Radiation Oncology | Admitting: Radiation Oncology

## 2023-12-21 ENCOUNTER — Other Ambulatory Visit: Payer: Self-pay

## 2023-12-21 DIAGNOSIS — C3411 Malignant neoplasm of upper lobe, right bronchus or lung: Secondary | ICD-10-CM | POA: Diagnosis not present

## 2023-12-21 LAB — RAD ONC ARIA SESSION SUMMARY
Course Elapsed Days: 9
Plan Fractions Treated to Date: 8
Plan Prescribed Dose Per Fraction: 3 Gy
Plan Total Fractions Prescribed: 10
Plan Total Prescribed Dose: 30 Gy
Reference Point Dosage Given to Date: 24 Gy
Reference Point Session Dosage Given: 3 Gy
Session Number: 8

## 2023-12-24 ENCOUNTER — Other Ambulatory Visit: Payer: Self-pay

## 2023-12-24 ENCOUNTER — Ambulatory Visit
Admission: RE | Admit: 2023-12-24 | Discharge: 2023-12-24 | Disposition: A | Discharge status: Home / Self Care | Source: Ambulatory Visit | Attending: Radiation Oncology

## 2023-12-24 DIAGNOSIS — C3411 Malignant neoplasm of upper lobe, right bronchus or lung: Secondary | ICD-10-CM | POA: Diagnosis not present

## 2023-12-24 LAB — RAD ONC ARIA SESSION SUMMARY
Course Elapsed Days: 12
Plan Fractions Treated to Date: 9
Plan Prescribed Dose Per Fraction: 3 Gy
Plan Total Fractions Prescribed: 10
Plan Total Prescribed Dose: 30 Gy
Reference Point Dosage Given to Date: 27 Gy
Reference Point Session Dosage Given: 3 Gy
Session Number: 9

## 2023-12-25 ENCOUNTER — Other Ambulatory Visit: Payer: Self-pay

## 2023-12-25 ENCOUNTER — Ambulatory Visit
Admission: RE | Admit: 2023-12-25 | Discharge: 2023-12-25 | Disposition: A | Discharge status: Home / Self Care | Source: Ambulatory Visit | Attending: Radiation Oncology

## 2023-12-25 DIAGNOSIS — C3411 Malignant neoplasm of upper lobe, right bronchus or lung: Secondary | ICD-10-CM | POA: Diagnosis not present

## 2023-12-25 LAB — RAD ONC ARIA SESSION SUMMARY
Course Elapsed Days: 13
Plan Fractions Treated to Date: 10
Plan Prescribed Dose Per Fraction: 3 Gy
Plan Total Fractions Prescribed: 10
Plan Total Prescribed Dose: 30 Gy
Reference Point Dosage Given to Date: 30 Gy
Reference Point Session Dosage Given: 3 Gy
Session Number: 10

## 2023-12-26 ENCOUNTER — Ambulatory Visit

## 2023-12-26 NOTE — Radiation Completion Notes (Addendum)
  Radiation Oncology         [phone] ________________________________  Name: Maxwell Grant MRN: [id number]  Date of Service: 12/25/2023  DOB: 12-Apr-1966  End of Treatment Note     Diagnosis: Stage IV, cT3N2M1c, NSCLC, adenocarcinoma of the RUL with bone metastases.   Intent: Palliative     ==========DELIVERED PLANS==========  First Treatment Date: 2023-12-12 Last Treatment Date: 2023-12-25   Plan Name: Spine_L_Iliac Site: Sacrum Technique: 3D Mode: Photon Dose Per Fraction: 3 Gy Prescribed Dose (Delivered / Prescribed): 30 Gy / 30 Gy Prescribed Fxs (Delivered / Prescribed): 10 / 10     ==========ON TREATMENT VISIT DATES========== 2023-12-14, 2023-12-21    See weekly On Treatment Notes in Epic for details in the Media tab (listed as Progress notes on the On Treatment Visit Dates listed above). The patient tolerated radiation. He developed fatigue and continued to have pain through the course of radiation.  The patient will receive a call in about one month from the radiation oncology department. We anticipate continued improvement of pain in this time. He will continue follow up with Dr. Marguerita Shih as well.      Shelvia Dick, PAC

## 2023-12-27 ENCOUNTER — Ambulatory Visit

## 2023-12-28 ENCOUNTER — Ambulatory Visit

## 2023-12-31 ENCOUNTER — Ambulatory Visit

## 2023-12-31 ENCOUNTER — Other Ambulatory Visit: Payer: Self-pay | Admitting: Radiation Oncology

## 2024-01-01 ENCOUNTER — Other Ambulatory Visit: Payer: Self-pay | Admitting: Adult Health

## 2024-01-01 ENCOUNTER — Other Ambulatory Visit: Payer: Self-pay | Admitting: Nurse Practitioner

## 2024-01-01 ENCOUNTER — Other Ambulatory Visit (HOSPITAL_COMMUNITY): Payer: Self-pay

## 2024-01-01 ENCOUNTER — Ambulatory Visit

## 2024-01-01 DIAGNOSIS — C3490 Malignant neoplasm of unspecified part of unspecified bronchus or lung: Secondary | ICD-10-CM

## 2024-01-01 DIAGNOSIS — Z515 Encounter for palliative care: Secondary | ICD-10-CM

## 2024-01-01 DIAGNOSIS — G893 Neoplasm related pain (acute) (chronic): Secondary | ICD-10-CM

## 2024-01-01 MED ORDER — APIXABAN 5 MG PO TABS
5.0000 mg | ORAL_TABLET | Freq: Two times a day (BID) | ORAL | 4 refills | Status: DC
  Filled 2024-01-11: qty 60, 30d supply, fill #0
  Filled 2024-02-03: qty 60, 30d supply, fill #1
  Filled 2024-03-06: qty 60, 30d supply, fill #2
  Filled 2024-04-08: qty 60, 30d supply, fill #3
  Filled 2024-05-12: qty 60, 30d supply, fill #4

## 2024-01-01 MED ORDER — OXYCODONE-ACETAMINOPHEN 7.5-325 MG PO TABS
1.0000 | ORAL_TABLET | Freq: Four times a day (QID) | ORAL | 0 refills | Status: DC | PRN
  Filled 2024-01-01: qty 60, 15d supply, fill #0

## 2024-01-01 MED ORDER — OXYCODONE HCL ER 20 MG PO T12A
20.0000 mg | EXTENDED_RELEASE_TABLET | Freq: Two times a day (BID) | ORAL | 0 refills | Status: DC
  Filled 2024-01-10: qty 60, 30d supply, fill #0

## 2024-01-01 NOTE — Progress Notes (Signed)
 [facility] OFFICE PROGRESS NOTE  Maxwell Ina, MD [address] [place] [address]  DIAGNOSIS: 1) Stage IV (T3, N2, M1 C) non-small cell lung cancer, adenocarcinoma presented with large right upper lobe lung mass in addition to right hilar and subcarinal lymphadenopathy in addition to multiple metastatic bone lesions involving the pelvis as well as the lower thoracic and lumbar spines in addition to metastatic disease to the pelvic muscles diagnosed in February 2024.  2) deep venous thrombosis of the right lower extremity diagnosed on December 18, 2022   Detected Alteration(s) / Biomarker(s)         Associated FDA-approved therapies  Clinical Trial Availability% cfDNA or Amplification   KRAS G12C approved by FDA Adagrasib, Sotorasib Yes    29.3%   CDK4 Amplification None Yes            High (+++)Plasma Copy Number: [id number]   PD-L1 expression 98%  PRIOR THERAPY:   Palliative radiotherapy to the painful bone metastasis under the care of Dr. Jeryl Moris    Radiation to the iliac spine on 12/25/2023  CURRENT THERAPY:  1) Systemic chemotherapy with carboplatin  for AUC of 5, Alimta 500 Mg/M2 and Keytruda  200 Mg IV every 3 weeks.  First dose November 14, 2022.  Status post 20 cycles.  Working from cycle #5 the patient has been on maintenance treatment with Alimta and Keytruda  every 3 weeks. 2) Eliquis  10 mg p.o. twice daily for 7 days followed by 5 mg p.o. twice daily.  First dose 12/26/2022   INTERVAL HISTORY: Maxwell Grant 58 y.o. male returns to the clinic today for a follow-up visit accompanied by his sister. The patient was last seen by Dr. Marguerita Shih 3 weeks ago.   he is currently on OxyContin  20 mg every 12 hours, oxycodone  7.5 mg 1-2x per day, MiraLAX, and Colace. He is currently on Eliquis  for history of DVT in the past. He sees palliative care. He is scheduled to see them today. He also completed palliative radiation to the back.   He got out and walked yesterday.  He reports his fatigue is stable. He was a little more fatigued when he was receiving radiation. He lost a few pounds since lasting being seen. He denies any fever. He reports some stable dyspnea on exertion which is unchanged. Denies any chest pain or hemoptysis.  He has an intermittent chronic cough which he also states is the same. He continues to smoke cigarettes. He averages 1 ppd. He denies any significant nausea with treatment. Denies any vomiting, diarrhea, or constipation. Denies any headache or visual changes. Denies any rashes or skin changes. He is here today for evaluation and repeat blood work before undergoing cycle #21.  MEDICAL HISTORY: Past Medical History:  Diagnosis Date   CHF (congestive heart failure) (HCC)    COPD (chronic obstructive pulmonary disease) (HCC)    Diabetes mellitus without complication (HCC)    Dyspnea    Hypertension    Lung cancer (HCC) 10/2022   Right leg DVT (HCC) 12/21/2022    ALLERGIES:  is allergic to statins, aspirin, flexeril [cyclobenzaprine], and penicillins.  MEDICATIONS:  Current Outpatient Medications  Medication Sig Dispense Refill   Accu-Chek FastClix Lancets MISC Use to test blood sugar 4 times a day 102 each 0   albuterol  (PROVENTIL ) (2.5 MG/3ML) 0.083% nebulizer solution Inhale 3 mL (2.5 mg total) by nebulization every 6 (six) hours as needed for wheezing. 360 mL 3   albuterol  (VENTOLIN  HFA) 108 (90  Base) MCG/ACT inhaler Inhale 1-2 puffs into the lungs every 6 (six) hours as needed for wheezing or shortness of breath. 18 g 1   albuterol  (VENTOLIN  HFA) 108 (90 Base) MCG/ACT inhaler Inhale 2 puffs every 6 (six) hours as needed for wheezing. 54 g 3   apixaban  (ELIQUIS ) 5 MG TABS tablet Take 1 tablet (5 mg total) by mouth 2 (two) times daily. 60 tablet 4   Azelastine  HCl 137 MCG/SPRAY SOLN Place 2 sprays into nostrils 2 (two) times daily. 30 mL 12   benzonatate  (TESSALON ) 100 MG capsule Take 1 capsule (100 mg total) by mouth 3 (three)  times daily as needed. 30 capsule 2   fluticasone  (FLONASE ) 50 MCG/ACT nasal spray Place 1 spray into both nostrils daily. 16 g 2   Fluticasone -Umeclidin-Vilant (TRELEGY ELLIPTA ) 100-62.5-25 MCG/ACT AEPB Inhale 1 puff into the lungs daily in the afternoon. 60 each 5   folic acid  (FOLVITE ) 1 MG tablet Take 1 tablet (1 mg total) by mouth daily. 90 tablet 1   furosemide  (LASIX ) 20 MG tablet Take 1 tablet (20 mg) by mouth daily as needed. 7 tablet 0   gabapentin  (NEURONTIN ) 300 MG capsule Take 2 capsules (600mg ) in the morning, 1 capsule (300 mg ) midday, and 2 capsules at bedtime (600mg ). 150 capsule 2   glimepiride (AMARYL) 2 MG tablet Take 2 mg by mouth in the morning.     lidocaine -prilocaine  (EMLA ) cream Apply to [facility] Prior to Access as needed 30 g 2   meloxicam  (MOBIC ) 7.5 MG tablet Take 1 tablet (7.5 mg total) by mouth 2 (two) times daily. 60 tablet 3   ondansetron  (ZOFRAN ) 8 MG tablet Take 1 tablet (8 mg) by mouth every 8 hours as needed for nausea or vomiting. 30 tablet 2   OVER THE COUNTER MEDICATION Take 1 tablet by mouth daily as needed (constipation). " Stool softener     [START ON 01/10/2024] oxyCODONE  (OXYCONTIN ) 20 mg 12 hr tablet Take 1 tablet (20 mg total) by mouth every 12 (twelve) hours. 60 tablet 0   oxyCODONE -acetaminophen  (PERCOCET) 7.5-325 MG tablet Take 1 tablet by mouth every 6 (six) hours as needed for severe pain (pain score 7-10). 60 tablet 0   prochlorperazine  (COMPAZINE ) 10 MG tablet Take 1 tablet (10 mg total) by mouth every 6 (six) hours as needed for nausea or vomiting. 30 tablet 0   TRELEGY ELLIPTA  100-62.5-25 MCG/ACT AEPB Inhale 1 puff once daily. 180 each 3   No current facility-administered medications for this visit.    SURGICAL HISTORY:  Past Surgical History:  Procedure Laterality Date   BRONCHIAL NEEDLE ASPIRATION BIOPSY  10/30/2022   Procedure: BRONCHIAL NEEDLE ASPIRATION BIOPSIES;  Surgeon: Denson Flake, MD;  Location: [facility] ENDOSCOPY;  Service:  Cardiopulmonary;;   IR IMAGING GUIDED PORT INSERTION  11/17/2022   NO PAST SURGERIES     RADIOLOGY WITH ANESTHESIA  10/30/2022   Procedure: MRI WITH ANESTHESIA W/WO CONTRAST;  Surgeon: Denson Flake, MD;  Location: [facility] ENDOSCOPY;  Service: Cardiopulmonary;;   VIDEO BRONCHOSCOPY WITH ENDOBRONCHIAL ULTRASOUND Right 10/30/2022   Procedure: VIDEO BRONCHOSCOPY WITH ENDOBRONCHIAL ULTRASOUND;  Surgeon: Denson Flake, MD;  Location: [facility] ENDOSCOPY;  Service: Cardiopulmonary;  Laterality: Right;    REVIEW OF SYSTEMS:   Constitutional: Positive for stable fatigue. Negative for appetite change, chills, fever and unexpected weight change.  HENT: Negative for mouth sores, nosebleeds, sore throat and trouble swallowing.   Eyes: Negative for eye problems and icterus.  Respiratory: Positive for baseline dyspnea on  exertion and stable intermittent cough.  Negative for hemoptysis and wheezing.     Cardiovascular: Negative for chest pain and leg swelling.  Gastrointestinal: Negative for abdominal pain, constipation, diarrhea, nausea and vomiting.  Genitourinary: Negative for bladder incontinence, difficulty urinating, dysuria, frequency and hematuria.   Musculoskeletal: Positive for cancer related bone pain (stable/improved). Negative for gait problem, neck pain and neck stiffness.  Skin: Negative for itching and rash.  Neurological: Negative for dizziness, extremity weakness, gait problem, headaches, light-headedness and seizures.  Hematological: Negative for adenopathy. Does not bruise/bleed easily.  Psychiatric/Behavioral: Negative for confusion, depression and sleep disturbance. The patient is not nervous/anxious.     PHYSICAL EXAMINATION:  There were no vitals taken for this visit.  ECOG PERFORMANCE STATUS: 1  Physical Exam  Constitutional: Oriented to person, place, and time and well-developed, well-nourished, and in no distress.  HENT:  Head: Normocephalic and atraumatic.  Mouth/Throat: Oropharynx  is clear and moist. No oropharyngeal exudate.  Eyes: Conjunctivae are normal. Right eye exhibits no discharge. Left eye exhibits no discharge. No scleral icterus.  Neck: Normal range of motion. Neck supple.  Cardiovascular: Normal rate, regular rhythm, normal heart sounds and intact distal pulses.   Pulmonary/Chest: Effort normal. Quiet breath sounds bilaterally. No respiratory distress. No wheezes. No rales.  Abdominal: Soft. Bowel sounds are normal. Exhibits no distension and no mass. There is no tenderness.  Musculoskeletal: Normal range of motion. Exhibits no significant lower extremity edema, just dry skin.  Lymphadenopathy:    No cervical adenopathy.  Neurological: Alert and oriented to person, place, and time. Exhibits muscle wasting. Examined in the wheelchair.  Skin: Skin is warm and dry. No rash noted. Not diaphoretic. No erythema. No pallor.  Psychiatric: Mood, memory and judgment normal.  Vitals reviewed.    LABORATORY DATA: Lab Results  Component Value Date   WBC 5.5 12/18/2023   HGB 12.6 (L) 12/18/2023   HCT 40.5 12/18/2023   MCV 97.4 12/18/2023   PLT 175 12/18/2023      Chemistry      Component Value Date/Time   NA 141 12/18/2023 0831   K 4.4 12/18/2023 0831   CL 99 12/18/2023 0831   CO2 39 (H) 12/18/2023 0831   BUN 15 12/18/2023 0831   CREATININE 0.87 12/18/2023 0831      Component Value Date/Time   CALCIUM  9.2 12/18/2023 0831   ALKPHOS 66 12/18/2023 0831   AST 13 (L) 12/18/2023 0831   ALT 9 12/18/2023 0831   BILITOT 0.8 12/18/2023 0831       RADIOGRAPHIC STUDIES:  No results found.   ASSESSMENT/PLAN:  This is a very pleasant 58 years old Caucasian male with stage IV (T3, N2, M1 C) non-small cell lung cancer, adenocarcinoma presented with large right upper lobe lung mass in addition to right hilar and subcarinal lymphadenopathy in addition to multiple metastatic bone lesions involving the pelvis as well as the lower thoracic and lumbar spines in  addition to metastatic disease to the pelvic muscles diagnosed in February 2024.  Molecular studies showed positive KRAS G12C mutation and PD-L1 expression of 98%.   The patient is here today to start the first cycle of systemic chemotherapy with carboplatin  for AUC of 5, Alimta 500 Mg/M2 and Keytruda  200 Mg IV every 3 weeks.  First dose November 14, 2022.  Status post 20 cycles.  Starting from cycle #5 the patient is on maintenance treatment with Alimta and Keytruda  every 3 weeks.   He received radiation to the iliac spine on  12/25/2023   Labs were reviewed. Recommend that he proceed with cycle #21 today as scheduled.    We will see him back for follow-up visit in 3 weeks for evaluation repeat blood work before undergoing cycle #21.   Will continue on his blood thinner for his history of DVT.   He will continue to follow with palliative care for his cancer related pain.  His pain is controlled at this time.  He is scheduled to see them today.    I will arrange for restaging CT scan of the chest, abdomen, pelvis prior to his next cycle of treatment.  He is scheduled to see palliative care today.   The patient was advised to call immediately if she has any concerning symptoms in the interval. The patient voices understanding of current disease status and treatment options and is in agreement with the current care plan. All questions were answered. The patient knows to call the clinic with any problems, questions or concerns. We can certainly see the patient much sooner if necessary    No orders of the defined types were placed in this encounter.   The total time spent in the appointment was 20-29 minutes  Romana Deaton L Laurice Kimmons, PA-C 01/01/24

## 2024-01-08 ENCOUNTER — Inpatient Hospital Stay: Payer: Medicaid Other

## 2024-01-08 ENCOUNTER — Encounter: Payer: Self-pay | Admitting: Nurse Practitioner

## 2024-01-08 ENCOUNTER — Inpatient Hospital Stay (HOSPITAL_BASED_OUTPATIENT_CLINIC_OR_DEPARTMENT_OTHER): Admitting: Nurse Practitioner

## 2024-01-08 ENCOUNTER — Inpatient Hospital Stay (HOSPITAL_BASED_OUTPATIENT_CLINIC_OR_DEPARTMENT_OTHER): Admitting: Physician Assistant

## 2024-01-08 ENCOUNTER — Ambulatory Visit: Payer: Medicaid Other | Admitting: Internal Medicine

## 2024-01-08 VITALS — BP 118/68 | HR 65 | Temp 98.2°F | Resp 19 | Wt 196.7 lb

## 2024-01-08 DIAGNOSIS — Z5112 Encounter for antineoplastic immunotherapy: Secondary | ICD-10-CM | POA: Diagnosis present

## 2024-01-08 DIAGNOSIS — C3411 Malignant neoplasm of upper lobe, right bronchus or lung: Secondary | ICD-10-CM | POA: Diagnosis present

## 2024-01-08 DIAGNOSIS — Z515 Encounter for palliative care: Secondary | ICD-10-CM | POA: Diagnosis not present

## 2024-01-08 DIAGNOSIS — R5383 Other fatigue: Secondary | ICD-10-CM | POA: Insufficient documentation

## 2024-01-08 DIAGNOSIS — F17210 Nicotine dependence, cigarettes, uncomplicated: Secondary | ICD-10-CM | POA: Insufficient documentation

## 2024-01-08 DIAGNOSIS — Z923 Personal history of irradiation: Secondary | ICD-10-CM | POA: Insufficient documentation

## 2024-01-08 DIAGNOSIS — R530 Neoplastic (malignant) related fatigue: Secondary | ICD-10-CM

## 2024-01-08 DIAGNOSIS — J309 Allergic rhinitis, unspecified: Secondary | ICD-10-CM | POA: Diagnosis not present

## 2024-01-08 DIAGNOSIS — I10 Essential (primary) hypertension: Secondary | ICD-10-CM | POA: Diagnosis not present

## 2024-01-08 DIAGNOSIS — E785 Hyperlipidemia, unspecified: Secondary | ICD-10-CM | POA: Insufficient documentation

## 2024-01-08 DIAGNOSIS — Z79899 Other long term (current) drug therapy: Secondary | ICD-10-CM | POA: Insufficient documentation

## 2024-01-08 DIAGNOSIS — C7951 Secondary malignant neoplasm of bone: Secondary | ICD-10-CM | POA: Diagnosis not present

## 2024-01-08 DIAGNOSIS — Y842 Radiological procedure and radiotherapy as the cause of abnormal reaction of the patient, or of later complication, without mention of misadventure at the time of the procedure: Secondary | ICD-10-CM | POA: Diagnosis not present

## 2024-01-08 DIAGNOSIS — C3490 Malignant neoplasm of unspecified part of unspecified bronchus or lung: Secondary | ICD-10-CM | POA: Diagnosis not present

## 2024-01-08 DIAGNOSIS — Z7901 Long term (current) use of anticoagulants: Secondary | ICD-10-CM | POA: Insufficient documentation

## 2024-01-08 DIAGNOSIS — Z79631 Long term (current) use of antimetabolite agent: Secondary | ICD-10-CM | POA: Diagnosis not present

## 2024-01-08 DIAGNOSIS — R0609 Other forms of dyspnea: Secondary | ICD-10-CM | POA: Insufficient documentation

## 2024-01-08 DIAGNOSIS — Z5111 Encounter for antineoplastic chemotherapy: Secondary | ICD-10-CM | POA: Insufficient documentation

## 2024-01-08 DIAGNOSIS — N62 Hypertrophy of breast: Secondary | ICD-10-CM | POA: Diagnosis not present

## 2024-01-08 DIAGNOSIS — I2510 Atherosclerotic heart disease of native coronary artery without angina pectoris: Secondary | ICD-10-CM | POA: Insufficient documentation

## 2024-01-08 DIAGNOSIS — F1290 Cannabis use, unspecified, uncomplicated: Secondary | ICD-10-CM | POA: Diagnosis not present

## 2024-01-08 DIAGNOSIS — J449 Chronic obstructive pulmonary disease, unspecified: Secondary | ICD-10-CM | POA: Diagnosis not present

## 2024-01-08 DIAGNOSIS — I700 Atherosclerosis of aorta: Secondary | ICD-10-CM | POA: Diagnosis not present

## 2024-01-08 DIAGNOSIS — Z86718 Personal history of other venous thrombosis and embolism: Secondary | ICD-10-CM | POA: Insufficient documentation

## 2024-01-08 DIAGNOSIS — E119 Type 2 diabetes mellitus without complications: Secondary | ICD-10-CM | POA: Diagnosis not present

## 2024-01-08 DIAGNOSIS — Z79620 Long term (current) use of immunosuppressive biologic: Secondary | ICD-10-CM | POA: Diagnosis not present

## 2024-01-08 DIAGNOSIS — Z886 Allergy status to analgesic agent status: Secondary | ICD-10-CM | POA: Insufficient documentation

## 2024-01-08 DIAGNOSIS — G893 Neoplasm related pain (acute) (chronic): Secondary | ICD-10-CM | POA: Diagnosis not present

## 2024-01-08 DIAGNOSIS — Z95828 Presence of other vascular implants and grafts: Secondary | ICD-10-CM

## 2024-01-08 DIAGNOSIS — Z880 Allergy status to penicillin: Secondary | ICD-10-CM | POA: Diagnosis not present

## 2024-01-08 DIAGNOSIS — Z888 Allergy status to other drugs, medicaments and biological substances status: Secondary | ICD-10-CM | POA: Insufficient documentation

## 2024-01-08 LAB — CMP (CANCER CENTER ONLY)
ALT: 11 U/L (ref 0–44)
AST: 13 U/L — ABNORMAL LOW (ref 15–41)
Albumin: 3.7 g/dL (ref 3.5–5.0)
Alkaline Phosphatase: 66 U/L (ref 38–126)
Anion gap: 3 — ABNORMAL LOW (ref 5–15)
BUN: 12 mg/dL (ref 6–20)
CO2: 37 mmol/L — ABNORMAL HIGH (ref 22–32)
Calcium: 8.8 mg/dL — ABNORMAL LOW (ref 8.9–10.3)
Chloride: 104 mmol/L (ref 98–111)
Creatinine: 0.94 mg/dL (ref 0.61–1.24)
GFR, Estimated: >60 mL/min (ref >60)
Glucose, Bld: 79 mg/dL (ref 70–99)
Potassium: 4 mmol/L (ref 3.5–5.1)
Sodium: 144 mmol/L (ref 135–145)
Total Bilirubin: 0.5 mg/dL (ref 0.0–1.2)
Total Protein: 6.3 g/dL — ABNORMAL LOW (ref 6.5–8.1)

## 2024-01-08 LAB — CBC WITH DIFFERENTIAL (CANCER CENTER ONLY)
Abs Immature Granulocytes: 0.01 10*3/uL (ref 0.00–0.07)
Basophils Absolute: 0 10*3/uL (ref 0.0–0.1)
Basophils Relative: 1 %
Eosinophils Absolute: 0.3 10*3/uL (ref 0.0–0.5)
Eosinophils Relative: 8 %
HCT: 35.3 % — ABNORMAL LOW (ref 39.0–52.0)
Hemoglobin: 11.4 g/dL — ABNORMAL LOW (ref 13.0–17.0)
Immature Granulocytes: 0 %
Lymphocytes Relative: 10 %
Lymphs Abs: 0.4 10*3/uL — ABNORMAL LOW (ref 0.7–4.0)
MCH: 30.5 pg (ref 26.0–34.0)
MCHC: 32.3 g/dL (ref 30.0–36.0)
MCV: 94.4 fL (ref 80.0–100.0)
Monocytes Absolute: 0.3 10*3/uL (ref 0.1–1.0)
Monocytes Relative: 7 %
Neutro Abs: 3 10*3/uL (ref 1.7–7.7)
Neutrophils Relative %: 74 %
Platelet Count: 151 10*3/uL (ref 150–400)
RBC: 3.74 MIL/uL — ABNORMAL LOW (ref 4.22–5.81)
RDW: 15.2 % (ref 11.5–15.5)
WBC Count: 4.1 10*3/uL (ref 4.0–10.5)
nRBC: 0 % (ref 0.0–0.2)

## 2024-01-08 LAB — TSH: TSH: 1.2 u[IU]/mL (ref 0.350–4.500)

## 2024-01-08 MED ORDER — CYANOCOBALAMIN 1000 MCG/ML IJ SOLN
1000.0000 ug | Freq: Once | INTRAMUSCULAR | Status: AC
  Administered 2024-01-08: 1000 ug via INTRAMUSCULAR
  Filled 2024-01-08: qty 1

## 2024-01-08 MED ORDER — SODIUM CHLORIDE 0.9 % IV SOLN
500.0000 mg/m2 | Freq: Once | INTRAVENOUS | Status: AC
  Administered 2024-01-08: 1000 mg via INTRAVENOUS
  Filled 2024-01-08: qty 40

## 2024-01-08 MED ORDER — SODIUM CHLORIDE 0.9% FLUSH
10.0000 mL | Freq: Once | INTRAVENOUS | Status: AC | PRN
  Administered 2024-01-08: 10 mL

## 2024-01-08 MED ORDER — HEPARIN SOD (PORK) LOCK FLUSH 100 UNIT/ML IV SOLN
500.0000 [IU] | Freq: Once | INTRAVENOUS | Status: AC | PRN
Start: 2024-01-08 — End: 2024-01-08
  Administered 2024-01-08: 500 [IU]

## 2024-01-08 MED ORDER — PROCHLORPERAZINE MALEATE 10 MG PO TABS
10.0000 mg | ORAL_TABLET | Freq: Once | ORAL | Status: AC
  Administered 2024-01-08: 10 mg via ORAL
  Filled 2024-01-08: qty 1

## 2024-01-08 MED ORDER — SODIUM CHLORIDE 0.9 % IV SOLN
200.0000 mg | Freq: Once | INTRAVENOUS | Status: AC
  Administered 2024-01-08: 200 mg via INTRAVENOUS
  Filled 2024-01-08: qty 200

## 2024-01-08 MED ORDER — SODIUM CHLORIDE 0.9 % IV SOLN
Freq: Once | INTRAVENOUS | Status: AC
Start: 2024-01-08 — End: 2024-01-08

## 2024-01-08 MED ORDER — SODIUM CHLORIDE 0.9% FLUSH
10.0000 mL | INTRAVENOUS | Status: DC | PRN
  Administered 2024-01-08: 10 mL

## 2024-01-08 NOTE — Progress Notes (Signed)
 Palliative Medicine [facility]  [phone] [phone]   Name: Maxwell Grant Date: 01/08/2024 MRN: [id number]  DOB: 1965/09/29  Patient Care Team: Alfredia Ina, MD as PCP - General (Family Medicine)    INTERVAL HISTORY: Maxwell Grant is a 58 y.o. male with oncologic medical history including new diagnosed non-small cell lung cancer (10/2022), as well as a history of COPD, diabetes mellitus, hypertension, dyslipidemia as well as history of osteomyelitis of the back.  Palliative ask to see for symptom and pain management and goals of care.   SOCIAL HISTORY:     reports that he has been smoking cigarettes. He has never used smokeless tobacco. He reports current alcohol use of about 1.0 standard drink of alcohol per week. He reports current drug use. Drug: Marijuana.  ADVANCE DIRECTIVES:  None on file   CODE STATUS: Full code  PAST MEDICAL HISTORY: Past Medical History:  Diagnosis Date   CHF (congestive heart failure) (HCC)    COPD (chronic obstructive pulmonary disease) (HCC)    Diabetes mellitus without complication (HCC)    Dyspnea    Hypertension    Lung cancer (HCC) 10/2022   Right leg DVT (HCC) 12/21/2022    ALLERGIES:  is allergic to statins, aspirin, flexeril [cyclobenzaprine], and penicillins.  MEDICATIONS:  Current Outpatient Medications  Medication Sig Dispense Refill   Accu-Chek FastClix Lancets MISC Use to test blood sugar 4 times a day 102 each 0   albuterol  (PROVENTIL ) (2.5 MG/3ML) 0.083% nebulizer solution Inhale 3 mL (2.5 mg total) by nebulization every 6 (six) hours as needed for wheezing. 360 mL 3   albuterol  (VENTOLIN  HFA) 108 (90 Base) MCG/ACT inhaler Inhale 1-2 puffs into the lungs every 6 (six) hours as needed for wheezing or shortness of breath. 18 g 1   albuterol  (VENTOLIN  HFA) 108 (90 Base) MCG/ACT inhaler Inhale 2 puffs every 6 (six) hours as needed for wheezing. 54 g 3   apixaban  (ELIQUIS ) 5 MG TABS tablet  Take 1 tablet (5 mg total) by mouth 2 (two) times daily. 60 tablet 4   Azelastine  HCl 137 MCG/SPRAY SOLN Place 2 sprays into nostrils 2 (two) times daily. 30 mL 12   benzonatate  (TESSALON ) 100 MG capsule Take 1 capsule (100 mg total) by mouth 3 (three) times daily as needed. 30 capsule 2   fluticasone  (FLONASE ) 50 MCG/ACT nasal spray Place 1 spray into both nostrils daily. 16 g 2   Fluticasone -Umeclidin-Vilant (TRELEGY ELLIPTA ) 100-62.5-25 MCG/ACT AEPB Inhale 1 puff into the lungs daily in the afternoon. 60 each 5   folic acid  (FOLVITE ) 1 MG tablet Take 1 tablet (1 mg total) by mouth daily. 90 tablet 1   furosemide  (LASIX ) 20 MG tablet Take 1 tablet (20 mg) by mouth daily as needed. 7 tablet 0   gabapentin  (NEURONTIN ) 300 MG capsule Take 2 capsules (600mg ) in the morning, 1 capsule (300 mg ) midday, and 2 capsules at bedtime (600mg ). 150 capsule 2   glimepiride (AMARYL) 2 MG tablet Take 2 mg by mouth in the morning.     lidocaine -prilocaine  (EMLA ) cream Apply to [facility] Prior to Access as needed 30 g 2   meloxicam  (MOBIC ) 7.5 MG tablet Take 1 tablet (7.5 mg total) by mouth 2 (two) times daily. 60 tablet 3   ondansetron  (ZOFRAN ) 8 MG tablet Take 1 tablet (8 mg) by mouth every 8 hours as needed for nausea or vomiting. 30 tablet 2   OVER THE COUNTER MEDICATION Take 1 tablet by  mouth daily as needed (constipation). " Stool softener     [START ON 01/10/2024] oxyCODONE  (OXYCONTIN ) 20 mg 12 hr tablet Take 1 tablet (20 mg total) by mouth every 12 (twelve) hours. 60 tablet 0   oxyCODONE -acetaminophen  (PERCOCET) 7.5-325 MG tablet Take 1 tablet by mouth every 6 (six) hours as needed for severe pain (pain score 7-10). 60 tablet 0   prochlorperazine  (COMPAZINE ) 10 MG tablet Take 1 tablet (10 mg total) by mouth every 6 (six) hours as needed for nausea or vomiting. 30 tablet 0   TRELEGY ELLIPTA  100-62.5-25 MCG/ACT AEPB Inhale 1 puff once daily. 180 each 3   No current facility-administered medications for this visit.     VITAL SIGNS: There were no vitals taken for this visit. There were no vitals filed for this visit.  Estimated body mass index is 31.75 kg/m as calculated from the following:   Height as of 12/18/23: 5\' 6"  (1.676 m).   Weight as of an earlier encounter on 01/08/24: 196 lb 11.2 oz (89.2 kg).   PERFORMANCE STATUS (ECOG) : 1 - Symptomatic but completely ambulatory   Physical Exam General: NAD Cardiovascular: regular rate and rhythm Pulmonary: normal breathing pattern Extremities: no edema, no joint deformities Skin: no rashes Neurological: AAO x3  IMPRESSION: Discussed the use of AI scribe software for clinical note transcription with the patient, who gave verbal consent to proceed.  History of Present Illness Maxwell Grant "Maxwell Grant" is a 58 year old male who presents for a follow-up visit.  He experiences some pain with movement but remains active. He attributes his fatigue to the after effects of radiation therapy however this is much improved. JR has been able to increase his activity and doing more things around the home. Denies concerns of nausea, vomiting, or diarrhea. His appetite is good, and he uses a stool softener as needed, denying significant issues with constipation. His sleep is adequate, and he does not report any new or worsening symptoms.  He is currently taking Oxycontin  every 12 hours and Percocet as needed. Does not require breakthrough medications around the clock. Pain is well controlled. No adjustments to current regimen.   We will continue to closely monitor and support. All questions answered.   I discussed the importance of continued conversation with family and their medical providers regarding overall plan of care and treatment options, ensuring decisions are within the context of the patients values and GOCs.  Assessment & Plan Pain Management His current regimen is effective. - Ensure timely access to pain medication as per current regimen. -Continue  OxyContin  20 mg every 12 hours -Continue oxycodone /acetaminophen  7.5/325 mg every 6 hours as needed  -Continue gabapentin  300 mg twice daily and 600 mg at bedtime.  For breakthrough pain.  Not requiring around-the-clock.  Fatigue Patient reports fatigue which he attributes to  recent radiation therapy. - Advise rest during fatigue. - Encourage activity as tolerated.  I will plan to see patient back in 6-8 weeks.  Sooner if needed.  Patient expressed understanding and was in agreement with this plan. He also understands that He can call the clinic at any time with any questions, concerns, or complaints.   Any controlled substances utilized were prescribed in the context of palliative care. PDMP has been reviewed.   Visit consisted of counseling and education dealing with the complex and emotionally intense issues of symptom management and palliative care in the setting of serious and potentially life-threatening illness.  Dellia Ferguson, AGPCNP-BC  Palliative Medicine Team/[facility]

## 2024-01-09 LAB — T4: T4, Total: 8.2 ug/dL (ref 4.5–12.0)

## 2024-01-10 ENCOUNTER — Other Ambulatory Visit (HOSPITAL_COMMUNITY): Payer: Self-pay

## 2024-01-10 ENCOUNTER — Other Ambulatory Visit: Payer: Self-pay

## 2024-01-11 ENCOUNTER — Other Ambulatory Visit (HOSPITAL_COMMUNITY): Payer: Self-pay

## 2024-01-23 ENCOUNTER — Ambulatory Visit (HOSPITAL_COMMUNITY)
Admission: RE | Admit: 2024-01-23 | Discharge: 2024-01-23 | Disposition: A | Discharge status: Home / Self Care | Source: Ambulatory Visit | Attending: Physician Assistant | Admitting: Physician Assistant

## 2024-01-23 DIAGNOSIS — C3490 Malignant neoplasm of unspecified part of unspecified bronchus or lung: Secondary | ICD-10-CM | POA: Diagnosis present

## 2024-01-23 MED ORDER — HEPARIN SOD (PORK) LOCK FLUSH 100 UNIT/ML IV SOLN
500.0000 [IU] | Freq: Once | INTRAVENOUS | Status: AC
  Administered 2024-01-23: 500 [IU] via INTRAVENOUS

## 2024-01-23 MED ORDER — HEPARIN SOD (PORK) LOCK FLUSH 100 UNIT/ML IV SOLN
INTRAVENOUS | Status: AC
  Filled 2024-01-23: qty 5

## 2024-01-23 MED ORDER — SODIUM CHLORIDE (PF) 0.9 % IJ SOLN
INTRAMUSCULAR | Status: AC
  Filled 2024-01-23: qty 50

## 2024-01-23 MED ORDER — IOHEXOL 300 MG/ML  SOLN
100.0000 mL | Freq: Once | INTRAMUSCULAR | Status: AC | PRN
  Administered 2024-01-23: 100 mL via INTRAVENOUS

## 2024-01-29 ENCOUNTER — Inpatient Hospital Stay: Payer: Medicaid Other | Attending: Internal Medicine

## 2024-01-29 ENCOUNTER — Other Ambulatory Visit: Payer: Self-pay | Admitting: Internal Medicine

## 2024-01-29 ENCOUNTER — Inpatient Hospital Stay: Payer: Medicaid Other

## 2024-01-29 ENCOUNTER — Inpatient Hospital Stay (HOSPITAL_BASED_OUTPATIENT_CLINIC_OR_DEPARTMENT_OTHER): Admitting: Nurse Practitioner

## 2024-01-29 ENCOUNTER — Inpatient Hospital Stay (HOSPITAL_BASED_OUTPATIENT_CLINIC_OR_DEPARTMENT_OTHER): Payer: Medicaid Other | Admitting: Internal Medicine

## 2024-01-29 ENCOUNTER — Encounter: Payer: Self-pay | Admitting: Nurse Practitioner

## 2024-01-29 VITALS — BP 155/68 | HR 62 | Temp 97.3°F | Resp 17 | Ht 66.0 in | Wt 197.5 lb

## 2024-01-29 DIAGNOSIS — Z5112 Encounter for antineoplastic immunotherapy: Secondary | ICD-10-CM | POA: Insufficient documentation

## 2024-01-29 DIAGNOSIS — E1169 Type 2 diabetes mellitus with other specified complication: Secondary | ICD-10-CM | POA: Diagnosis not present

## 2024-01-29 DIAGNOSIS — Z79631 Long term (current) use of antimetabolite agent: Secondary | ICD-10-CM | POA: Diagnosis not present

## 2024-01-29 DIAGNOSIS — I110 Hypertensive heart disease with heart failure: Secondary | ICD-10-CM | POA: Diagnosis not present

## 2024-01-29 DIAGNOSIS — Z880 Allergy status to penicillin: Secondary | ICD-10-CM | POA: Insufficient documentation

## 2024-01-29 DIAGNOSIS — F1290 Cannabis use, unspecified, uncomplicated: Secondary | ICD-10-CM | POA: Insufficient documentation

## 2024-01-29 DIAGNOSIS — C3411 Malignant neoplasm of upper lobe, right bronchus or lung: Secondary | ICD-10-CM | POA: Diagnosis present

## 2024-01-29 DIAGNOSIS — Z5111 Encounter for antineoplastic chemotherapy: Secondary | ICD-10-CM | POA: Diagnosis present

## 2024-01-29 DIAGNOSIS — Z79620 Long term (current) use of immunosuppressive biologic: Secondary | ICD-10-CM | POA: Diagnosis not present

## 2024-01-29 DIAGNOSIS — Y9301 Activity, walking, marching and hiking: Secondary | ICD-10-CM | POA: Insufficient documentation

## 2024-01-29 DIAGNOSIS — K769 Liver disease, unspecified: Secondary | ICD-10-CM | POA: Diagnosis not present

## 2024-01-29 DIAGNOSIS — I700 Atherosclerosis of aorta: Secondary | ICD-10-CM | POA: Insufficient documentation

## 2024-01-29 DIAGNOSIS — G893 Neoplasm related pain (acute) (chronic): Secondary | ICD-10-CM | POA: Insufficient documentation

## 2024-01-29 DIAGNOSIS — R530 Neoplastic (malignant) related fatigue: Secondary | ICD-10-CM | POA: Diagnosis not present

## 2024-01-29 DIAGNOSIS — N281 Cyst of kidney, acquired: Secondary | ICD-10-CM | POA: Diagnosis not present

## 2024-01-29 DIAGNOSIS — C3490 Malignant neoplasm of unspecified part of unspecified bronchus or lung: Secondary | ICD-10-CM

## 2024-01-29 DIAGNOSIS — M84454A Pathological fracture, pelvis, initial encounter for fracture: Secondary | ICD-10-CM | POA: Diagnosis not present

## 2024-01-29 DIAGNOSIS — Z886 Allergy status to analgesic agent status: Secondary | ICD-10-CM | POA: Diagnosis not present

## 2024-01-29 DIAGNOSIS — Z95828 Presence of other vascular implants and grafts: Secondary | ICD-10-CM

## 2024-01-29 DIAGNOSIS — E785 Hyperlipidemia, unspecified: Secondary | ICD-10-CM | POA: Diagnosis not present

## 2024-01-29 DIAGNOSIS — C7951 Secondary malignant neoplasm of bone: Secondary | ICD-10-CM | POA: Insufficient documentation

## 2024-01-29 DIAGNOSIS — Z86718 Personal history of other venous thrombosis and embolism: Secondary | ICD-10-CM | POA: Diagnosis not present

## 2024-01-29 DIAGNOSIS — Z515 Encounter for palliative care: Secondary | ICD-10-CM | POA: Diagnosis not present

## 2024-01-29 DIAGNOSIS — F17210 Nicotine dependence, cigarettes, uncomplicated: Secondary | ICD-10-CM | POA: Diagnosis not present

## 2024-01-29 DIAGNOSIS — S2221XA Fracture of manubrium, initial encounter for closed fracture: Secondary | ICD-10-CM | POA: Insufficient documentation

## 2024-01-29 DIAGNOSIS — Z79899 Other long term (current) drug therapy: Secondary | ICD-10-CM | POA: Insufficient documentation

## 2024-01-29 DIAGNOSIS — M549 Dorsalgia, unspecified: Secondary | ICD-10-CM | POA: Diagnosis not present

## 2024-01-29 DIAGNOSIS — Z888 Allergy status to other drugs, medicaments and biological substances status: Secondary | ICD-10-CM | POA: Insufficient documentation

## 2024-01-29 DIAGNOSIS — N62 Hypertrophy of breast: Secondary | ICD-10-CM | POA: Diagnosis not present

## 2024-01-29 DIAGNOSIS — Z7901 Long term (current) use of anticoagulants: Secondary | ICD-10-CM | POA: Diagnosis not present

## 2024-01-29 LAB — CMP (CANCER CENTER ONLY)
ALT: 8 U/L (ref 0–44)
AST: 13 U/L — ABNORMAL LOW (ref 15–41)
Albumin: 3.8 g/dL (ref 3.5–5.0)
Alkaline Phosphatase: 62 U/L (ref 38–126)
Anion gap: 3 — ABNORMAL LOW (ref 5–15)
BUN: 15 mg/dL (ref 6–20)
CO2: 38 mmol/L — ABNORMAL HIGH (ref 22–32)
Calcium: 8.9 mg/dL (ref 8.9–10.3)
Chloride: 102 mmol/L (ref 98–111)
Creatinine: 0.92 mg/dL (ref 0.61–1.24)
GFR, Estimated: >60 mL/min (ref >60)
Glucose, Bld: 73 mg/dL (ref 70–99)
Potassium: 4.5 mmol/L (ref 3.5–5.1)
Sodium: 143 mmol/L (ref 135–145)
Total Bilirubin: 0.4 mg/dL (ref 0.0–1.2)
Total Protein: 6.6 g/dL (ref 6.5–8.1)

## 2024-01-29 LAB — CBC WITH DIFFERENTIAL (CANCER CENTER ONLY)
Abs Immature Granulocytes: 0.02 10*3/uL (ref 0.00–0.07)
Basophils Absolute: 0 10*3/uL (ref 0.0–0.1)
Basophils Relative: 1 %
Eosinophils Absolute: 0.1 10*3/uL (ref 0.0–0.5)
Eosinophils Relative: 4 %
HCT: 35.6 % — ABNORMAL LOW (ref 39.0–52.0)
Hemoglobin: 11 g/dL — ABNORMAL LOW (ref 13.0–17.0)
Immature Granulocytes: 1 %
Lymphocytes Relative: 14 %
Lymphs Abs: 0.5 10*3/uL — ABNORMAL LOW (ref 0.7–4.0)
MCH: 30.1 pg (ref 26.0–34.0)
MCHC: 30.9 g/dL (ref 30.0–36.0)
MCV: 97.3 fL (ref 80.0–100.0)
Monocytes Absolute: 0.3 10*3/uL (ref 0.1–1.0)
Monocytes Relative: 9 %
Neutro Abs: 2.6 10*3/uL (ref 1.7–7.7)
Neutrophils Relative %: 71 %
Platelet Count: 188 10*3/uL (ref 150–400)
RBC: 3.66 MIL/uL — ABNORMAL LOW (ref 4.22–5.81)
RDW: 15.6 % — ABNORMAL HIGH (ref 11.5–15.5)
WBC Count: 3.6 10*3/uL — ABNORMAL LOW (ref 4.0–10.5)
nRBC: 0 % (ref 0.0–0.2)

## 2024-01-29 MED ORDER — ZOLEDRONIC ACID 4 MG/100ML IV SOLN
4.0000 mg | Freq: Once | INTRAVENOUS | Status: AC
  Administered 2024-01-29: 4 mg via INTRAVENOUS
  Filled 2024-01-29: qty 100

## 2024-01-29 MED ORDER — PROCHLORPERAZINE MALEATE 10 MG PO TABS
10.0000 mg | ORAL_TABLET | Freq: Once | ORAL | Status: AC
  Administered 2024-01-29: 10 mg via ORAL
  Filled 2024-01-29: qty 1

## 2024-01-29 MED ORDER — SODIUM CHLORIDE 0.9 % IV SOLN
200.0000 mg | Freq: Once | INTRAVENOUS | Status: AC
  Administered 2024-01-29: 200 mg via INTRAVENOUS
  Filled 2024-01-29: qty 200

## 2024-01-29 MED ORDER — SODIUM CHLORIDE 0.9 % IV SOLN
500.0000 mg/m2 | Freq: Once | INTRAVENOUS | Status: AC
  Administered 2024-01-29: 1000 mg via INTRAVENOUS
  Filled 2024-01-29: qty 40

## 2024-01-29 MED ORDER — SODIUM CHLORIDE 0.9% FLUSH
3.0000 mL | Freq: Once | INTRAVENOUS | Status: AC | PRN
  Administered 2024-01-29: 3 mL

## 2024-01-29 MED ORDER — SODIUM CHLORIDE 0.9 % IV SOLN
Freq: Once | INTRAVENOUS | Status: AC
Start: 2024-01-29 — End: 2024-01-29

## 2024-01-29 NOTE — Progress Notes (Signed)
 Palliative Medicine [facility]  Telephone:(336) [phone] Fax:(336) [phone]   Name: Maxwell Grant Date: 01/29/2024 MRN: [id number]  DOB: June 20, 1966  Patient Care Team: Alfredia Ina, MD as PCP - General (Family Medicine)    INTERVAL HISTORY: Maxwell Grant is a 58 y.o. male with oncologic medical history including new diagnosed non-small cell lung cancer (10/2022), as well as a history of COPD, diabetes mellitus, hypertension, dyslipidemia as well as history of osteomyelitis of the back.  Palliative ask to see for symptom and pain management and goals of care.   SOCIAL HISTORY:     reports that he has been smoking cigarettes. He has never used smokeless tobacco. He reports current alcohol use of about 1.0 standard drink of alcohol per week. He reports current drug use. Drug: Marijuana.  ADVANCE DIRECTIVES:  None on file   CODE STATUS: Full code  PAST MEDICAL HISTORY: Past Medical History:  Diagnosis Date   CHF (congestive heart failure) (HCC)    COPD (chronic obstructive pulmonary disease) (HCC)    Diabetes mellitus without complication (HCC)    Dyspnea    Hypertension    Lung cancer (HCC) 10/2022   Right leg DVT (HCC) 12/21/2022    ALLERGIES:  is allergic to statins, aspirin, flexeril [cyclobenzaprine], and penicillins.  MEDICATIONS:  Current Outpatient Medications  Medication Sig Dispense Refill   Accu-Chek FastClix Lancets MISC Use to test blood sugar 4 times a day 102 each 0   albuterol  (PROVENTIL ) (2.5 MG/3ML) 0.083% nebulizer solution Inhale 3 mL (2.5 mg total) by nebulization every 6 (six) hours as needed for wheezing. 360 mL 3   albuterol  (VENTOLIN  HFA) 108 (90 Base) MCG/ACT inhaler Inhale 1-2 puffs into the lungs every 6 (six) hours as needed for wheezing or shortness of breath. 18 g 1   albuterol  (VENTOLIN  HFA) 108 (90 Base) MCG/ACT inhaler Inhale 2 puffs every 6 (six) hours as needed for wheezing. 54 g 3   apixaban  (ELIQUIS ) 5 MG TABS tablet  Take 1 tablet (5 mg total) by mouth 2 (two) times daily. 60 tablet 4   Azelastine  HCl 137 MCG/SPRAY SOLN Place 2 sprays into nostrils 2 (two) times daily. 30 mL 12   benzonatate  (TESSALON ) 100 MG capsule Take 1 capsule (100 mg total) by mouth 3 (three) times daily as needed. 30 capsule 2   fluticasone  (FLONASE ) 50 MCG/ACT nasal spray Place 1 spray into both nostrils daily. 16 g 2   Fluticasone -Umeclidin-Vilant (TRELEGY ELLIPTA ) 100-62.5-25 MCG/ACT AEPB Inhale 1 puff into the lungs daily in the afternoon. 60 each 5   folic acid  (FOLVITE ) 1 MG tablet Take 1 tablet (1 mg total) by mouth daily. 90 tablet 1   furosemide  (LASIX ) 20 MG tablet Take 1 tablet (20 mg) by mouth daily as needed. 7 tablet 0   gabapentin  (NEURONTIN ) 300 MG capsule Take 2 capsules (600mg ) in the morning, 1 capsule (300 mg ) midday, and 2 capsules at bedtime (600mg ). 150 capsule 2   glimepiride (AMARYL) 2 MG tablet Take 2 mg by mouth in the morning.     lidocaine -prilocaine  (EMLA ) cream Apply to [facility] Prior to Access as needed 30 g 2   meloxicam  (MOBIC ) 7.5 MG tablet Take 1 tablet (7.5 mg total) by mouth 2 (two) times daily. 60 tablet 3   ondansetron  (ZOFRAN ) 8 MG tablet Take 1 tablet (8 mg) by mouth every 8 hours as needed for nausea or vomiting. 30 tablet 2   OVER THE COUNTER MEDICATION Take 1 tablet by  mouth daily as needed (constipation). " Stool softener     oxyCODONE  (OXYCONTIN ) 20 mg 12 hr tablet Take 1 tablet (20 mg total) by mouth every 12 (twelve) hours. 60 tablet 0   oxyCODONE -acetaminophen  (PERCOCET) 7.5-325 MG tablet Take 1 tablet by mouth every 6 (six) hours as needed for severe pain (pain score 7-10). 60 tablet 0   prochlorperazine  (COMPAZINE ) 10 MG tablet Take 1 tablet (10 mg total) by mouth every 6 (six) hours as needed for nausea or vomiting. 30 tablet 0   TRELEGY ELLIPTA  100-62.5-25 MCG/ACT AEPB Inhale 1 puff once daily. 180 each 3   No current facility-administered medications for this visit.    Facility-Administered Medications Ordered in Other Visits  Medication Dose Route Frequency Provider Last Rate Last Admin   pembrolizumab  (KEYTRUDA ) 200 mg in sodium chloride  0.9 % 50 mL chemo infusion  200 mg Intravenous Once Mohamed, Mohamed, MD       PEMEtrexed  Disodium (ALIMTA) 1,000 mg in sodium chloride  0.9 % 100 mL chemo infusion  500 mg/m2 (Treatment Plan Recorded) Intravenous Once Marlene Simas, MD        VITAL SIGNS: There were no vitals taken for this visit. There were no vitals filed for this visit.  Estimated body mass index is 31.88 kg/m as calculated from the following:   Height as of an earlier encounter on 01/29/24: 5\' 6"  (1.676 m).   Weight as of an earlier encounter on 01/29/24: 197 lb 8 oz (89.6 kg).   PERFORMANCE STATUS (ECOG) : 1 - Symptomatic but completely ambulatory   Physical Exam General: NAD Cardiovascular: regular rate and rhythm Pulmonary: normal breathing pattern Extremities: no edema, no joint deformities Skin: no rashes Neurological: AAO x3  IMPRESSION: Discussed the use of AI scribe software for clinical note transcription with the patient, who gave verbal consent to proceed.  History of Present Illness Maxwell Grant "[name]" is a 58 year old male who presents for a routine follow-up. No acute distress. Accompanied by family. He has a good appetite, no issues with constipation, and sleeps well. His energy level is good, although he experiences pain after walking for a bit.  He is currently taking gabapentin , with a regimen of 300 mg in the morning and 600 mg at night, to manage pain. Additionally, he takes oxycontin  one tablet every twelve hours and has Percocet available as needed. Does not require daily use of breakthrough medication. Reports pain is well controlled. He has not required early refills for his medications, indicating effective pain management with the current regimen. No adjustments to regimen at this time.   All questions  answered and support provided.   Assessment & Plan Cancer Related Pain Management His current regimen is effective. - Ensure timely access to pain medication as per current regimen. -Continue OxyContin  20 mg every 12 hours -Continue oxycodone /acetaminophen  7.5/325 mg every 6 hours as needed.  For breakthrough pain.  Not requiring around-the-clock. -Continue gabapentin  300 mg twice daily and 600 mg at bedtime.   I will plan to see patient back in 6-8 weeks.  Sooner if needed.  Patient expressed understanding and was in agreement with this plan. He also understands that He can call the clinic at any time with any questions, concerns, or complaints.   Any controlled substances utilized were prescribed in the context of palliative care. PDMP has been reviewed.   Visit consisted of counseling and education dealing with the complex and emotionally intense issues of symptom management and palliative care in the setting  of serious and potentially life-threatening illness.  Dellia Ferguson, AGPCNP-BC  Palliative Medicine Team/[facility]

## 2024-01-29 NOTE — Progress Notes (Signed)
 [facility] Telephone:(336) [phone]   Fax:(336) [phone]  OFFICE PROGRESS NOTE  Maxwell Ina, MD [address] [place] [address]  DIAGNOSIS:  1) Stage IV (T3, N2, M1 C) non-small cell lung cancer, adenocarcinoma presented with large right upper lobe lung mass in addition to right hilar and subcarinal lymphadenopathy in addition to multiple metastatic bone lesions involving the pelvis as well as the lower thoracic and lumbar spines in addition to metastatic disease to the pelvic muscles diagnosed in February 2024.  2) deep venous thrombosis of the right lower extremity diagnosed on December 18, 2022  Detected Alteration(s) / Biomarker(s) Associated FDA-approved therapies Clinical Trial Availability% cfDNA or Amplification  KRAS G12C approved by FDA Adagrasib, Sotorasib Yes 29.3%  CDK4 Amplification None Yes High (+++)Plasma Copy Number: 8.0  PD-L1 expression 98%  PRIOR THERAPY: None  CURRENT THERAPY:  1) Systemic chemotherapy with carboplatin  for AUC of 5, Alimta 500 Mg/M2 and Keytruda  200 Mg IV every 3 weeks.  First dose November 14, 2022.  Status post 21 cycles.  Starting from cycle #5 the patient is on maintenance treatment with Alimta and Keytruda  every 3 weeks. 2) Eliquis  10 mg p.o. twice daily for 7 days followed by 5 mg p.o. twice daily.  First dose 12/26/2022.  INTERVAL HISTORY: Maxwell Grant 58 y.o. male returns to the clinic today for follow-up visit after by his sister. Discussed the use of AI scribe software for clinical note transcription with the patient, who gave verbal consent to proceed.  History of Present Illness   Maxwell Grant "Maxwell Grant" is a 58 year old male undergoing treatment for cancer who presents for evaluation and cycle number 22 of his treatment. He is accompanied by his sister.  He is currently on a treatment regimen with Alimta and Keytruda , administered every three weeks, and is now on cycle number 22. He has  completed 21 cycles of this regimen without any new or progressive metastatic disease as per recent scans. The scans show stable disease with a stable right upper lobe nodule and no evidence of growth.  He experiences back pain after walking for a while and has noticed a painful lump on the right side of his chest, under the nipple, for over a month. He describes the lump as feeling like fluid under the skin.  He has been feeling congested over the last couple of days. No other new complaints since the last visit aside from the back pain and chest lump.  He smokes marijuana.       MEDICAL HISTORY: Past Medical History:  Diagnosis Date   CHF (congestive heart failure) (HCC)    COPD (chronic obstructive pulmonary disease) (HCC)    Diabetes mellitus without complication (HCC)    Dyspnea    Hypertension    Lung cancer (HCC) 10/2022   Right leg DVT (HCC) 12/21/2022    ALLERGIES:  is allergic to statins, aspirin, flexeril [cyclobenzaprine], and penicillins.  MEDICATIONS:  Current Outpatient Medications  Medication Sig Dispense Refill   Accu-Chek FastClix Lancets MISC Use to test blood sugar 4 times a day 102 each 0   albuterol  (PROVENTIL ) (2.5 MG/3ML) 0.083% nebulizer solution Inhale 3 mL (2.5 mg total) by nebulization every 6 (six) hours as needed for wheezing. 360 mL 3   albuterol  (VENTOLIN  HFA) 108 (90 Base) MCG/ACT inhaler Inhale 1-2 puffs into the lungs every 6 (six) hours as needed for wheezing or shortness of breath. 18 g 1  albuterol  (VENTOLIN  HFA) 108 (90 Base) MCG/ACT inhaler Inhale 2 puffs every 6 (six) hours as needed for wheezing. 54 g 3   apixaban  (ELIQUIS ) 5 MG TABS tablet Take 1 tablet (5 mg total) by mouth 2 (two) times daily. 60 tablet 4   Azelastine  HCl 137 MCG/SPRAY SOLN Place 2 sprays into nostrils 2 (two) times daily. 30 mL 12   benzonatate  (TESSALON ) 100 MG capsule Take 1 capsule (100 mg total) by mouth 3 (three) times daily as needed. 30 capsule 2   fluticasone   (FLONASE ) 50 MCG/ACT nasal spray Place 1 spray into both nostrils daily. 16 g 2   Fluticasone -Umeclidin-Vilant (TRELEGY ELLIPTA ) 100-62.5-25 MCG/ACT AEPB Inhale 1 puff into the lungs daily in the afternoon. 60 each 5   folic acid  (FOLVITE ) 1 MG tablet Take 1 tablet (1 mg total) by mouth daily. 90 tablet 1   furosemide  (LASIX ) 20 MG tablet Take 1 tablet (20 mg) by mouth daily as needed. 7 tablet 0   gabapentin  (NEURONTIN ) 300 MG capsule Take 2 capsules (600mg ) in the morning, 1 capsule (300 mg ) midday, and 2 capsules at bedtime (600mg ). 150 capsule 2   glimepiride (AMARYL) 2 MG tablet Take 2 mg by mouth in the morning.     lidocaine -prilocaine  (EMLA ) cream Apply to [facility] Prior to Access as needed 30 g 2   meloxicam  (MOBIC ) 7.5 MG tablet Take 1 tablet (7.5 mg total) by mouth 2 (two) times daily. 60 tablet 3   ondansetron  (ZOFRAN ) 8 MG tablet Take 1 tablet (8 mg) by mouth every 8 hours as needed for nausea or vomiting. 30 tablet 2   OVER THE COUNTER MEDICATION Take 1 tablet by mouth daily as needed (constipation). " Stool softener     oxyCODONE  (OXYCONTIN ) 20 mg 12 hr tablet Take 1 tablet (20 mg total) by mouth every 12 (twelve) hours. 60 tablet 0   oxyCODONE -acetaminophen  (PERCOCET) 7.5-325 MG tablet Take 1 tablet by mouth every 6 (six) hours as needed for severe pain (pain score 7-10). 60 tablet 0   prochlorperazine  (COMPAZINE ) 10 MG tablet Take 1 tablet (10 mg total) by mouth every 6 (six) hours as needed for nausea or vomiting. 30 tablet 0   TRELEGY ELLIPTA  100-62.5-25 MCG/ACT AEPB Inhale 1 puff once daily. 180 each 3   No current facility-administered medications for this visit.    SURGICAL HISTORY:  Past Surgical History:  Procedure Laterality Date   BRONCHIAL NEEDLE ASPIRATION BIOPSY  10/30/2022   Procedure: BRONCHIAL NEEDLE ASPIRATION BIOPSIES;  Surgeon: Denson Flake, MD;  Location: [facility] ENDOSCOPY;  Service: Cardiopulmonary;;   IR IMAGING GUIDED PORT INSERTION  11/17/2022   NO PAST  SURGERIES     RADIOLOGY WITH ANESTHESIA  10/30/2022   Procedure: MRI WITH ANESTHESIA W/WO CONTRAST;  Surgeon: Denson Flake, MD;  Location[facility] ENDOSCOPY;  Service: Cardiopulmonary;;   VIDEO BRONCHOSCOPY WITH ENDOBRONCHIAL ULTRASOUND Right 10/30/2022   Procedure: VIDEO BRONCHOSCOPY WITH ENDOBRONCHIAL ULTRASOUND;  Surgeon: Denson Flake, MD;  Location: [facility] ENDOSCOPY;  Service: Cardiopulmonary;  Laterality: Right;    REVIEW OF SYSTEMS:  Constitutional: negative Eyes: negative Ears, nose, mouth, throat, and face: negative Respiratory: negative Cardiovascular: negative Gastrointestinal: negative Genitourinary:negative Integument/breast: positive for breast tenderness Hematologic/lymphatic: negative Musculoskeletal:positive for back pain Neurological: negative Behavioral/Psych: negative Endocrine: negative Allergic/Immunologic: negative   PHYSICAL EXAMINATION: General appearance: alert, cooperative, fatigued, and no distress Head: Normocephalic, without obvious abnormality, atraumatic Neck: no adenopathy, no JVD, supple, symmetrical, trachea midline, and thyroid  not enlarged, symmetric, no tenderness/mass/nodules Lymph nodes:  Cervical, supraclavicular, and axillary nodes normal. Resp: clear to auscultation bilaterally Back: symmetric, no curvature. ROM normal. No CVA tenderness. Cardio: regular rate and rhythm, S1, S2 normal, no murmur, click, rub or gallop GI: soft, non-tender; bowel sounds normal; no masses,  no organomegaly Extremities: extremities normal, atraumatic, no cyanosis or edema Neurologic: Alert and oriented X 3, normal strength and tone. Normal symmetric reflexes. Normal coordination and gait  ECOG PERFORMANCE STATUS: 1 - Symptomatic but completely ambulatory  Blood pressure (!) 155/68, pulse 62, temperature (!) 97.3 F (36.3 C), temperature source Temporal, resp. rate 17, height 5\' 6"  (1.676 m), weight 197 lb 8 oz (89.6 kg), SpO2 94%.  LABORATORY DATA: Lab Results   Component Value Date   WBC 4.1 01/08/2024   HGB 11.4 (L) 01/08/2024   HCT 35.3 (L) 01/08/2024   MCV 94.4 01/08/2024   PLT 151 01/08/2024      Chemistry      Component Value Date/Time   NA 144 01/08/2024 0806   K 4.0 01/08/2024 0806   CL 104 01/08/2024 0806   CO2 37 (H) 01/08/2024 0806   BUN 12 01/08/2024 0806   CREATININE 0.94 01/08/2024 0806      Component Value Date/Time   CALCIUM  8.8 (L) 01/08/2024 0806   ALKPHOS 66 01/08/2024 0806   AST 13 (L) 01/08/2024 0806   ALT 11 01/08/2024 0806   BILITOT 0.5 01/08/2024 0806       RADIOGRAPHIC STUDIES: CT CHEST ABDOMEN PELVIS W CONTRAST Result Date: 01/23/2024 CLINICAL DATA:  Non-small cell lung cancer, restaging. * Tracking Code: BO * EXAM: CT CHEST, ABDOMEN, AND PELVIS WITH CONTRAST TECHNIQUE: Multidetector CT imaging of the chest, abdomen and pelvis was performed following the standard protocol during bolus administration of intravenous contrast. RADIATION DOSE REDUCTION: This exam was performed according to the departmental dose-optimization program which includes automated exposure control, adjustment of the mA and/or kV according to patient size and/or use of iterative reconstruction technique. CONTRAST:  OMNIPAQUE  IOHEXOL  300 MG/ML  SOLN COMPARISON:  Multiple priors including most recent CT November 20, 2023 FINDINGS: CT CHEST FINDINGS Cardiovascular: Accessed right chest Port-A-Cath with tip in the right atrium. Aortic atherosclerosis. Normal size heart. Coronary artery calcifications. Mediastinum/Nodes: No suspicious thyroid  nodule. No pathologically enlarged mediastinal, hilar or axillary lymph nodes. Lungs/Pleura: Spiculated right upper lobe pulmonary nodule measures 2.9 x 2.4 cm on image 42/3 previously 2.8 x 2.3 cm when remeasured for consistency. Stable 4 mm subpleural right lower lobe pulmonary nodule on image 110/four. No new suspicious pulmonary nodules or masses. Linear bands of atelectasis/scarring in the lingula are  unchanged. Mild diffuse bronchial wall thickening. Musculoskeletal: Bilateral gynecomastia. Similar mixed lytic and sclerotic osseous metastatic disease with pathologic fractures of the manubrium and T8 vertebral body. No new aggressive lytic or blastic lesion of bone identified. Stable chronic healed bilateral rib fractures. CT ABDOMEN PELVIS FINDINGS Hepatobiliary: No suspicious hepatic lesion. Stable hypodense segment 4 hepatic lesion on image 67/2 favored a cyst. Gallbladder is unremarkable. No biliary ductal dilation. Pancreas: No pancreatic ductal dilation or evidence of acute inflammation. Spleen: No splenomegaly. Adrenals/Urinary Tract: Bilateral adrenal glands appear normal. No hydronephrosis. Stable right renal cysts. Kidneys demonstrate symmetric enhancement. Urinary bladder is unremarkable for minimal distension. Stomach/Bowel: Stomach is within normal limits. No evidence of bowel wall thickening, distention, or inflammatory changes. Vascular/Lymphatic: Aortic atherosclerosis. No pathologically enlarged abdominal or pelvic lymph nodes. Reproductive: Prostate is unremarkable. Other: No significant abdominopelvic free fluid. Musculoskeletal: Similar mixed lytic and sclerotic osseous metastatic disease with pathologic  fracture of the left ilium. No new aggressive lytic or blastic lesion of bone. IMPRESSION: 1. Stable spiculated right upper lobe pulmonary nodule. 2. Similar mixed lytic and sclerotic osseous metastatic disease with pathologic fractures of the manubrium and T8 vertebral body. 3. No evidence of new or progressive metastatic disease in the chest, abdomen or pelvis. Electronically Signed   By: Tama Fails M.D.   On: 01/23/2024 15:00     ASSESSMENT AND PLAN: This is a very pleasant 58 years old white male with stage IV (T3, N2, M1 C) non-small cell lung cancer, adenocarcinoma presented with large right upper lobe lung mass in addition to right hilar and subcarinal lymphadenopathy in  addition to multiple metastatic bone lesions involving the pelvis as well as the lower thoracic and lumbar spines in addition to metastatic disease to the pelvic muscles diagnosed in February 2024.  Molecular studies showed positive KRAS G12C mutation and PD-L1 expression of 98%. The patient is here today to start the first cycle of systemic chemotherapy with carboplatin  for AUC of 5, Alimta 500 Mg/M2 and Keytruda  200 Mg IV every 3 weeks.  First dose November 14, 2022.  Status post 21 cycles.  Starting from cycle #5 the patient is on maintenance treatment with Alimta and Keytruda  every 3 weeks.  He has been tolerating this treatment fairly well. He had repeat CT scan of the chest, abdomen pelvis performed recently.  I personally independently reviewed the scan and discussed the result with the patient and his sister.  His scan showed no concerning findings for disease progression.    Malignant neoplasm of upper lobe, right bronchus or lung Currently on cycle 22 of Alimta and Keytruda . Recent imaging shows no new or progressive metastatic disease in the chest, abdomen, and pelvis, with a well-managed right upper lobe nodule. Treatment is effective and well-tolerated. Immune therapy can continue up to cycle 35, and chemotherapy with Alimta can continue as long as it remains effective. - Continue Alimta and Keytruda  treatment, aiming for up to 35 cycles as long as it remains effective. - Evaluate every three weeks to monitor treatment response and disease progression.  Bilateral gynecomastia Presents with bilateral gynecomastia, more pronounced on the right side, possibly due to proximity of the port. No evidence of new or progressive disease on imaging. - Monitor gynecomastia for any changes or complications. - Smoking Marijuana may contribute to this condition.   The patient was advised to call immediately if he has any other concerning symptoms in the interval. The patient voices understanding of  current disease status and treatment options and is in agreement with the current care plan.  All questions were answered. The patient knows to call the clinic with any problems, questions or concerns. We can certainly see the patient much sooner if necessary.  The total time spent in the appointment was 30 minutes.  Disclaimer: This note was dictated with voice recognition software. Similar sounding words can inadvertently be transcribed and may not be corrected upon review.

## 2024-02-03 ENCOUNTER — Other Ambulatory Visit: Payer: Self-pay | Admitting: Physician Assistant

## 2024-02-03 ENCOUNTER — Other Ambulatory Visit: Payer: Self-pay | Admitting: Nurse Practitioner

## 2024-02-03 DIAGNOSIS — M792 Neuralgia and neuritis, unspecified: Secondary | ICD-10-CM

## 2024-02-03 DIAGNOSIS — C3490 Malignant neoplasm of unspecified part of unspecified bronchus or lung: Secondary | ICD-10-CM

## 2024-02-03 DIAGNOSIS — G893 Neoplasm related pain (acute) (chronic): Secondary | ICD-10-CM

## 2024-02-03 DIAGNOSIS — Z515 Encounter for palliative care: Secondary | ICD-10-CM

## 2024-02-03 MED ORDER — GABAPENTIN 300 MG PO CAPS
ORAL_CAPSULE | ORAL | 2 refills | Status: DC
  Filled 2024-02-03 – 2024-02-07 (×2): qty 150, 30d supply, fill #0
  Filled 2024-03-06: qty 150, 30d supply, fill #1
  Filled 2024-04-08: qty 150, 30d supply, fill #2

## 2024-02-03 MED ORDER — OXYCODONE HCL ER 20 MG PO T12A
20.0000 mg | EXTENDED_RELEASE_TABLET | Freq: Two times a day (BID) | ORAL | 0 refills | Status: DC
Start: 2024-02-06 — End: 2024-03-06
  Filled 2024-02-06: qty 60, 30d supply, fill #0

## 2024-02-03 MED ORDER — OXYCODONE-ACETAMINOPHEN 7.5-325 MG PO TABS
1.0000 | ORAL_TABLET | Freq: Four times a day (QID) | ORAL | 0 refills | Status: DC | PRN
  Filled 2024-02-03: qty 60, 15d supply, fill #0

## 2024-02-04 ENCOUNTER — Other Ambulatory Visit (HOSPITAL_COMMUNITY): Payer: Self-pay

## 2024-02-04 ENCOUNTER — Other Ambulatory Visit: Payer: Self-pay

## 2024-02-04 MED ORDER — ONDANSETRON HCL 8 MG PO TABS
8.0000 mg | ORAL_TABLET | Freq: Three times a day (TID) | ORAL | 2 refills | Status: AC | PRN
  Filled 2024-02-04: qty 30, 10d supply, fill #0
  Filled 2024-03-06: qty 30, 10d supply, fill #1
  Filled 2024-04-08: qty 30, 10d supply, fill #2

## 2024-02-06 ENCOUNTER — Other Ambulatory Visit (HOSPITAL_COMMUNITY): Payer: Self-pay

## 2024-02-07 ENCOUNTER — Other Ambulatory Visit (HOSPITAL_COMMUNITY): Payer: Self-pay

## 2024-02-08 ENCOUNTER — Other Ambulatory Visit: Payer: Self-pay

## 2024-02-09 ENCOUNTER — Other Ambulatory Visit (HOSPITAL_COMMUNITY): Payer: Self-pay

## 2024-02-11 ENCOUNTER — Other Ambulatory Visit (HOSPITAL_COMMUNITY): Payer: Self-pay

## 2024-02-19 ENCOUNTER — Inpatient Hospital Stay: Payer: Medicaid Other | Attending: Internal Medicine

## 2024-02-19 ENCOUNTER — Inpatient Hospital Stay (HOSPITAL_BASED_OUTPATIENT_CLINIC_OR_DEPARTMENT_OTHER): Admitting: Nurse Practitioner

## 2024-02-19 ENCOUNTER — Inpatient Hospital Stay: Payer: Medicaid Other

## 2024-02-19 ENCOUNTER — Inpatient Hospital Stay (HOSPITAL_BASED_OUTPATIENT_CLINIC_OR_DEPARTMENT_OTHER): Payer: Medicaid Other | Admitting: Internal Medicine

## 2024-02-19 VITALS — BP 136/61 | HR 74 | Temp 98.6°F | Resp 18 | Wt 193.2 lb

## 2024-02-19 DIAGNOSIS — Z5112 Encounter for antineoplastic immunotherapy: Secondary | ICD-10-CM | POA: Diagnosis present

## 2024-02-19 DIAGNOSIS — Z79899 Other long term (current) drug therapy: Secondary | ICD-10-CM | POA: Diagnosis not present

## 2024-02-19 DIAGNOSIS — Z515 Encounter for palliative care: Secondary | ICD-10-CM | POA: Diagnosis not present

## 2024-02-19 DIAGNOSIS — R630 Anorexia: Secondary | ICD-10-CM

## 2024-02-19 DIAGNOSIS — I10 Essential (primary) hypertension: Secondary | ICD-10-CM | POA: Diagnosis not present

## 2024-02-19 DIAGNOSIS — C7951 Secondary malignant neoplasm of bone: Secondary | ICD-10-CM | POA: Insufficient documentation

## 2024-02-19 DIAGNOSIS — Z886 Allergy status to analgesic agent status: Secondary | ICD-10-CM | POA: Insufficient documentation

## 2024-02-19 DIAGNOSIS — C3490 Malignant neoplasm of unspecified part of unspecified bronchus or lung: Secondary | ICD-10-CM

## 2024-02-19 DIAGNOSIS — F17210 Nicotine dependence, cigarettes, uncomplicated: Secondary | ICD-10-CM | POA: Diagnosis not present

## 2024-02-19 DIAGNOSIS — Z7901 Long term (current) use of anticoagulants: Secondary | ICD-10-CM | POA: Diagnosis not present

## 2024-02-19 DIAGNOSIS — Z86718 Personal history of other venous thrombosis and embolism: Secondary | ICD-10-CM | POA: Insufficient documentation

## 2024-02-19 DIAGNOSIS — C3411 Malignant neoplasm of upper lobe, right bronchus or lung: Secondary | ICD-10-CM | POA: Insufficient documentation

## 2024-02-19 DIAGNOSIS — G893 Neoplasm related pain (acute) (chronic): Secondary | ICD-10-CM

## 2024-02-19 DIAGNOSIS — Z888 Allergy status to other drugs, medicaments and biological substances status: Secondary | ICD-10-CM | POA: Insufficient documentation

## 2024-02-19 DIAGNOSIS — Z5111 Encounter for antineoplastic chemotherapy: Secondary | ICD-10-CM | POA: Insufficient documentation

## 2024-02-19 DIAGNOSIS — E119 Type 2 diabetes mellitus without complications: Secondary | ICD-10-CM | POA: Diagnosis not present

## 2024-02-19 DIAGNOSIS — I700 Atherosclerosis of aorta: Secondary | ICD-10-CM | POA: Insufficient documentation

## 2024-02-19 DIAGNOSIS — J449 Chronic obstructive pulmonary disease, unspecified: Secondary | ICD-10-CM | POA: Diagnosis not present

## 2024-02-19 DIAGNOSIS — N62 Hypertrophy of breast: Secondary | ICD-10-CM | POA: Diagnosis not present

## 2024-02-19 DIAGNOSIS — M84454A Pathological fracture, pelvis, initial encounter for fracture: Secondary | ICD-10-CM | POA: Diagnosis not present

## 2024-02-19 DIAGNOSIS — F1290 Cannabis use, unspecified, uncomplicated: Secondary | ICD-10-CM | POA: Insufficient documentation

## 2024-02-19 DIAGNOSIS — E785 Hyperlipidemia, unspecified: Secondary | ICD-10-CM | POA: Diagnosis not present

## 2024-02-19 DIAGNOSIS — M792 Neuralgia and neuritis, unspecified: Secondary | ICD-10-CM

## 2024-02-19 DIAGNOSIS — Z880 Allergy status to penicillin: Secondary | ICD-10-CM | POA: Diagnosis not present

## 2024-02-19 DIAGNOSIS — N281 Cyst of kidney, acquired: Secondary | ICD-10-CM | POA: Diagnosis not present

## 2024-02-19 DIAGNOSIS — R530 Neoplastic (malignant) related fatigue: Secondary | ICD-10-CM

## 2024-02-19 LAB — CMP (CANCER CENTER ONLY)
ALT: 8 U/L (ref 0–44)
AST: 11 U/L — ABNORMAL LOW (ref 15–41)
Albumin: 3.8 g/dL (ref 3.5–5.0)
Alkaline Phosphatase: 59 U/L (ref 38–126)
Anion gap: 3 — ABNORMAL LOW (ref 5–15)
BUN: 19 mg/dL (ref 6–20)
CO2: 40 mmol/L — ABNORMAL HIGH (ref 22–32)
Calcium: 8.7 mg/dL — ABNORMAL LOW (ref 8.9–10.3)
Chloride: 100 mmol/L (ref 98–111)
Creatinine: 0.82 mg/dL (ref 0.61–1.24)
GFR, Estimated: >60 mL/min (ref >60)
Glucose, Bld: 136 mg/dL — ABNORMAL HIGH (ref 70–99)
Potassium: 4.2 mmol/L (ref 3.5–5.1)
Sodium: 143 mmol/L (ref 135–145)
Total Bilirubin: 0.6 mg/dL (ref 0.0–1.2)
Total Protein: 6.7 g/dL (ref 6.5–8.1)

## 2024-02-19 LAB — CBC WITH DIFFERENTIAL (CANCER CENTER ONLY)
Abs Immature Granulocytes: 0.01 10*3/uL (ref 0.00–0.07)
Basophils Absolute: 0 10*3/uL (ref 0.0–0.1)
Basophils Relative: 0 %
Eosinophils Absolute: 0.1 10*3/uL (ref 0.0–0.5)
Eosinophils Relative: 2 %
HCT: 37.9 % — ABNORMAL LOW (ref 39.0–52.0)
Hemoglobin: 11.6 g/dL — ABNORMAL LOW (ref 13.0–17.0)
Immature Granulocytes: 0 %
Lymphocytes Relative: 6 %
Lymphs Abs: 0.3 10*3/uL — ABNORMAL LOW (ref 0.7–4.0)
MCH: 30.1 pg (ref 26.0–34.0)
MCHC: 30.6 g/dL (ref 30.0–36.0)
MCV: 98.2 fL (ref 80.0–100.0)
Monocytes Absolute: 0.3 10*3/uL (ref 0.1–1.0)
Monocytes Relative: 5 %
Neutro Abs: 4.4 10*3/uL (ref 1.7–7.7)
Neutrophils Relative %: 87 %
Platelet Count: 166 10*3/uL (ref 150–400)
RBC: 3.86 MIL/uL — ABNORMAL LOW (ref 4.22–5.81)
RDW: 16.1 % — ABNORMAL HIGH (ref 11.5–15.5)
WBC Count: 5.2 10*3/uL (ref 4.0–10.5)
nRBC: 0 % (ref 0.0–0.2)

## 2024-02-19 MED ORDER — SODIUM CHLORIDE 0.9% FLUSH
10.0000 mL | Freq: Once | INTRAVENOUS | Status: AC
  Administered 2024-02-19: 10 mL via INTRAVENOUS

## 2024-02-19 MED ORDER — HEPARIN SOD (PORK) LOCK FLUSH 100 UNIT/ML IV SOLN
500.0000 [IU] | Freq: Once | INTRAVENOUS | Status: AC | PRN
  Administered 2024-02-19: 500 [IU]

## 2024-02-19 MED ORDER — SODIUM CHLORIDE 0.9 % IV SOLN: Freq: Once | INTRAVENOUS | Status: AC

## 2024-02-19 MED ORDER — SODIUM CHLORIDE 0.9 % IV SOLN
200.0000 mg | Freq: Once | INTRAVENOUS | Status: AC
  Administered 2024-02-19: 200 mg via INTRAVENOUS
  Filled 2024-02-19: qty 200

## 2024-02-19 MED ORDER — PROCHLORPERAZINE MALEATE 10 MG PO TABS
10.0000 mg | ORAL_TABLET | Freq: Once | ORAL | Status: AC
  Administered 2024-02-19: 10 mg via ORAL
  Filled 2024-02-19: qty 1

## 2024-02-19 MED ORDER — SODIUM CHLORIDE 0.9 % IV SOLN
500.0000 mg/m2 | Freq: Once | INTRAVENOUS | Status: AC
  Administered 2024-02-19: 1000 mg via INTRAVENOUS
  Filled 2024-02-19: qty 40

## 2024-02-19 MED ORDER — SODIUM CHLORIDE 0.9% FLUSH
10.0000 mL | INTRAVENOUS | Status: DC | PRN
  Administered 2024-02-19: 10 mL

## 2024-02-19 NOTE — Patient Instructions (Signed)
 [facility] - A DEPT OF [facility]  Discharge Instructions: Thank you for choosing [facility] to provide your oncology and hematology care.   If you have a lab appointment with the Cancer Center, please go directly to the Cancer Center and check in at the registration area.   Wear comfortable clothing and clothing appropriate for easy access to any Portacath or PICC line.   We strive to give you quality time with your provider. You may need to reschedule your appointment if you arrive late (15 or more minutes).  Arriving late affects you and other patients whose appointments are after yours.  Also, if you miss three or more appointments without notifying the office, you may be dismissed from the clinic at the provider's discretion.      For prescription refill requests, have your pharmacy contact our office and allow 72 hours for refills to be completed.    Today you received the following chemotherapy and/or immunotherapy agents: Keytruda/Alimta      To help prevent nausea and vomiting after your treatment, we encourage you to take your nausea medication as directed.  BELOW ARE SYMPTOMS THAT SHOULD BE REPORTED IMMEDIATELY: *FEVER GREATER THAN 100.4 F (38 C) OR HIGHER *CHILLS OR SWEATING *NAUSEA AND VOMITING THAT IS NOT CONTROLLED WITH YOUR NAUSEA MEDICATION *UNUSUAL SHORTNESS OF BREATH *UNUSUAL BRUISING OR BLEEDING *URINARY PROBLEMS (pain or burning when urinating, or frequent urination) *BOWEL PROBLEMS (unusual diarrhea, constipation, pain near the anus) TENDERNESS IN MOUTH AND THROAT WITH OR WITHOUT PRESENCE OF ULCERS (sore throat, sores in mouth, or a toothache) UNUSUAL RASH, SWELLING OR PAIN  UNUSUAL VAGINAL DISCHARGE OR ITCHING   Items with * indicate a potential emergency and should be followed up as soon as possible or go to the Emergency Department if any problems should occur.  Please show the CHEMOTHERAPY ALERT CARD or  IMMUNOTHERAPY ALERT CARD at check-in to the Emergency Department and triage nurse.  Should you have questions after your visit or need to cancel or reschedule your appointment, please contact [facility] - A DEPT OF [facility]  Dept: [phone]  and follow the prompts.  Office hours are 8:00 a.m. to 4:30 p.m. Monday - Friday. Please note that voicemails left after 4:00 p.m. may not be returned until the following business day.  We are closed weekends and major holidays. You have access to a nurse at all times for urgent questions. Please call the main number to the clinic Dept: [phone] and follow the prompts.   For any non-urgent questions, you may also contact your provider using MyChart. We now offer e-Visits for anyone 6 and older to request care online for non-urgent symptoms. For details visit [email or web address].   Also download the MyChart app! Go to the app store, search "MyChart", open the app, select [place], and log in with your MyChart username and password.

## 2024-02-19 NOTE — Progress Notes (Signed)
 [facility] Telephone:(336) [phone]   Fax:(336) [phone]  OFFICE PROGRESS NOTE  Alfredia Ina, MD [address] [place] [address]  DIAGNOSIS:  1) Stage IV (T3, N2, M1 C) non-small cell lung cancer, adenocarcinoma presented with large right upper lobe lung mass in addition to right hilar and subcarinal lymphadenopathy in addition to multiple metastatic bone lesions involving the pelvis as well as the lower thoracic and lumbar spines in addition to metastatic disease to the pelvic muscles diagnosed in February 2024.  2) deep venous thrombosis of the right lower extremity diagnosed on December 18, 2022  Detected Alteration(s) / Biomarker(s) Associated FDA-approved therapies Clinical Trial Availability% cfDNA or Amplification  KRAS G12C approved by FDA Adagrasib, Sotorasib Yes 29.3%  CDK4 Amplification None Yes High (+++)Plasma Copy Number: 8.0  PD-L1 expression 98%  PRIOR THERAPY: None  CURRENT THERAPY:  1) Systemic chemotherapy with carboplatin  for AUC of 5, Alimta 500 Mg/M2 and Keytruda  200 Mg IV every 3 weeks.  First dose November 14, 2022.  Status post 21 cycles.  Starting from cycle #5 the patient is on maintenance treatment with Alimta and Keytruda  every 3 weeks. 2) Eliquis  10 mg p.o. twice daily for 7 days followed by 5 mg p.o. twice daily.  First dose 12/26/2022.  INTERVAL HISTORY: Maxwell Grant 58 y.o. male returns to the clinic today for follow-up visit after by his sister. Discussed the use of AI scribe software for clinical note transcription with the patient, who gave verbal consent to proceed.  History of Present Illness   Maxwell Grant "[name]" is a 58 year old male with stage four non-small cell lung cancer who presents for evaluation before starting cycle number twenty-three of maintenance treatment. He is accompanied by his sister.  He was diagnosed with stage four non-small cell lung cancer, adenocarcinoma, in February 2024,  characterized by a positive KRAS G12C mutation and PD-L1 expression of 98%. Initially, he underwent palliative systemic chemotherapy with carboplatin , Alimta, and Keytruda  for four cycles. Since then, he has been on maintenance treatment with Alimta and Keytruda  every three weeks. He is currently post twenty-two cycles and is here for evaluation before starting cycle number twenty-three.  He has a history of deep venous thrombosis in the right lower extremity, diagnosed in April 2024, for which he is on Eliquis .  Recently, he experienced a respiratory infection and has been to the pulmonologist a couple of times. He is currently on his second round of prednisone , having started the first round with antibiotics and prednisone  a week ago Thursday. He has a cough with tan-colored sputum over the last couple of weeks, but no blood is present. He is unsure of the prednisone  dose.  A recent scan was performed a few weeks ago, and his symptoms began shortly after the scan.        MEDICAL HISTORY: Past Medical History:  Diagnosis Date   CHF (congestive heart failure) (HCC)    COPD (chronic obstructive pulmonary disease) (HCC)    Diabetes mellitus without complication (HCC)    Dyspnea    Hypertension    Lung cancer (HCC) 10/2022   Right leg DVT (HCC) 12/21/2022    ALLERGIES:  is allergic to statins, aspirin, flexeril [cyclobenzaprine], and penicillins.  MEDICATIONS:  Current Outpatient Medications  Medication Sig Dispense Refill   Accu-Chek FastClix Lancets MISC Use to test blood sugar 4 times a day 102 each 0   albuterol  (PROVENTIL ) (2.5 MG/3ML) 0.083% nebulizer solution Inhale  3 mL (2.5 mg total) by nebulization every 6 (six) hours as needed for wheezing. 360 mL 3   albuterol  (VENTOLIN  HFA) 108 (90 Base) MCG/ACT inhaler Inhale 1-2 puffs into the lungs every 6 (six) hours as needed for wheezing or shortness of breath. 18 g 1   albuterol  (VENTOLIN  HFA) 108 (90 Base) MCG/ACT inhaler Inhale 2  puffs every 6 (six) hours as needed for wheezing. 54 g 3   apixaban  (ELIQUIS ) 5 MG TABS tablet Take 1 tablet (5 mg total) by mouth 2 (two) times daily. 60 tablet 4   Azelastine  HCl 137 MCG/SPRAY SOLN Place 2 sprays into nostrils 2 (two) times daily. 30 mL 12   benzonatate  (TESSALON ) 100 MG capsule Take 1 capsule (100 mg total) by mouth 3 (three) times daily as needed. 30 capsule 2   fluticasone  (FLONASE ) 50 MCG/ACT nasal spray Place 1 spray into both nostrils daily. 16 g 2   Fluticasone -Umeclidin-Vilant (TRELEGY ELLIPTA ) 100-62.5-25 MCG/ACT AEPB Inhale 1 puff into the lungs daily in the afternoon. 60 each 5   folic acid  (FOLVITE ) 1 MG tablet Take 1 tablet (1 mg total) by mouth daily. 90 tablet 1   furosemide  (LASIX ) 20 MG tablet Take 1 tablet (20 mg) by mouth daily as needed. 7 tablet 0   gabapentin  (NEURONTIN ) 300 MG capsule Take 2 capsules (600mg ) in the morning, 1 capsule (300 mg ) midday, and 2 capsules at bedtime (600mg ). 150 capsule 2   glimepiride (AMARYL) 2 MG tablet Take 2 mg by mouth in the morning.     lidocaine -prilocaine  (EMLA ) cream Apply to [facility] Prior to Access as needed 30 g 2   meloxicam  (MOBIC ) 7.5 MG tablet Take 1 tablet (7.5 mg total) by mouth 2 (two) times daily. 60 tablet 3   ondansetron  (ZOFRAN ) 8 MG tablet Take 1 tablet (8 mg) by mouth every 8 hours as needed for nausea or vomiting. 30 tablet 2   OVER THE COUNTER MEDICATION Take 1 tablet by mouth daily as needed (constipation). " Stool softener     oxyCODONE  (OXYCONTIN ) 20 mg 12 hr tablet Take 1 tablet (20 mg total) by mouth every 12 (twelve) hours. 60 tablet 0   oxyCODONE -acetaminophen  (PERCOCET) 7.5-325 MG tablet Take 1 tablet by mouth every 6 (six) hours as needed for severe pain (pain score 7-10). 60 tablet 0   prochlorperazine  (COMPAZINE ) 10 MG tablet Take 1 tablet (10 mg total) by mouth every 6 (six) hours as needed for nausea or vomiting. 30 tablet 0   TRELEGY ELLIPTA  100-62.5-25 MCG/ACT AEPB Inhale 1 puff once daily.  180 each 3   No current facility-administered medications for this visit.    SURGICAL HISTORY:  Past Surgical History:  Procedure Laterality Date   BRONCHIAL NEEDLE ASPIRATION BIOPSY  10/30/2022   Procedure: BRONCHIAL NEEDLE ASPIRATION BIOPSIES;  Surgeon: Denson Flake, MD;  Location: [facility] ENDOSCOPY;  Service: Cardiopulmonary;;   IR IMAGING GUIDED PORT INSERTION  11/17/2022   NO PAST SURGERIES     RADIOLOGY WITH ANESTHESIA  10/30/2022   Procedure: MRI WITH ANESTHESIA W/WO CONTRAST;  Surgeon: Denson Flake, MD;  Location: [facility] ENDOSCOPY;  Service: Cardiopulmonary;;   VIDEO BRONCHOSCOPY WITH ENDOBRONCHIAL ULTRASOUND Right 10/30/2022   Procedure: VIDEO BRONCHOSCOPY WITH ENDOBRONCHIAL ULTRASOUND;  Surgeon: Denson Flake, MD;  Location: [facility] ENDOSCOPY;  Service: Cardiopulmonary;  Laterality: Right;    REVIEW OF SYSTEMS:  Constitutional: negative Eyes: negative Ears, nose, mouth, throat, and face: negative Respiratory: positive for cough and sputum Cardiovascular: negative Gastrointestinal: negative Genitourinary:negative  Integument/breast: negative Hematologic/lymphatic: negative Musculoskeletal:negative Neurological: negative Behavioral/Psych: negative Endocrine: negative Allergic/Immunologic: negative   PHYSICAL EXAMINATION: General appearance: alert, cooperative, fatigued, and no distress Head: Normocephalic, without obvious abnormality, atraumatic Neck: no adenopathy, no JVD, supple, symmetrical, trachea midline, and thyroid  not enlarged, symmetric, no tenderness/mass/nodules Lymph nodes: Cervical, supraclavicular, and axillary nodes normal. Resp: clear to auscultation bilaterally Back: symmetric, no curvature. ROM normal. No CVA tenderness. Cardio: regular rate and rhythm, S1, S2 normal, no murmur, click, rub or gallop GI: soft, non-tender; bowel sounds normal; no masses,  no organomegaly Extremities: extremities normal, atraumatic, no cyanosis or edema Neurologic: Alert and  oriented X 3, normal strength and tone. Normal symmetric reflexes. Normal coordination and gait  ECOG PERFORMANCE STATUS: 1 - Symptomatic but completely ambulatory  Blood pressure 136/61, pulse 74, temperature 98.6 F (37 C), temperature source Temporal, resp. rate 18, weight 193 lb 3.2 oz (87.6 kg), SpO2 93%.  LABORATORY DATA: Lab Results  Component Value Date   WBC 3.6 (L) 01/29/2024   HGB 11.0 (L) 01/29/2024   HCT 35.6 (L) 01/29/2024   MCV 97.3 01/29/2024   PLT 188 01/29/2024      Chemistry      Component Value Date/Time   NA 143 01/29/2024 0920   K 4.5 01/29/2024 0920   CL 102 01/29/2024 0920   CO2 38 (H) 01/29/2024 0920   BUN 15 01/29/2024 0920   CREATININE 0.92 01/29/2024 0920      Component Value Date/Time   CALCIUM  8.9 01/29/2024 0920   ALKPHOS 62 01/29/2024 0920   AST 13 (L) 01/29/2024 0920   ALT 8 01/29/2024 0920   BILITOT 0.4 01/29/2024 0920       RADIOGRAPHIC STUDIES: CT CHEST ABDOMEN PELVIS W CONTRAST Result Date: 01/23/2024 CLINICAL DATA:  Non-small cell lung cancer, restaging. * Tracking Code: BO * EXAM: CT CHEST, ABDOMEN, AND PELVIS WITH CONTRAST TECHNIQUE: Multidetector CT imaging of the chest, abdomen and pelvis was performed following the standard protocol during bolus administration of intravenous contrast. RADIATION DOSE REDUCTION: This exam was performed according to the departmental dose-optimization program which includes automated exposure control, adjustment of the mA and/or kV according to patient size and/or use of iterative reconstruction technique. CONTRAST:  OMNIPAQUE  IOHEXOL  300 MG/ML  SOLN COMPARISON:  Multiple priors including most recent CT November 20, 2023 FINDINGS: CT CHEST FINDINGS Cardiovascular: Accessed right chest Port-A-Cath with tip in the right atrium. Aortic atherosclerosis. Normal size heart. Coronary artery calcifications. Mediastinum/Nodes: No suspicious thyroid  nodule. No pathologically enlarged mediastinal, hilar or axillary  lymph nodes. Lungs/Pleura: Spiculated right upper lobe pulmonary nodule measures 2.9 x 2.4 cm on image 42/3 previously 2.8 x 2.3 cm when remeasured for consistency. Stable 4 mm subpleural right lower lobe pulmonary nodule on image 110/four. No new suspicious pulmonary nodules or masses. Linear bands of atelectasis/scarring in the lingula are unchanged. Mild diffuse bronchial wall thickening. Musculoskeletal: Bilateral gynecomastia. Similar mixed lytic and sclerotic osseous metastatic disease with pathologic fractures of the manubrium and T8 vertebral body. No new aggressive lytic or blastic lesion of bone identified. Stable chronic healed bilateral rib fractures. CT ABDOMEN PELVIS FINDINGS Hepatobiliary: No suspicious hepatic lesion. Stable hypodense segment 4 hepatic lesion on image 67/2 favored a cyst. Gallbladder is unremarkable. No biliary ductal dilation. Pancreas: No pancreatic ductal dilation or evidence of acute inflammation. Spleen: No splenomegaly. Adrenals/Urinary Tract: Bilateral adrenal glands appear normal. No hydronephrosis. Stable right renal cysts. Kidneys demonstrate symmetric enhancement. Urinary bladder is unremarkable for minimal distension. Stomach/Bowel: Stomach is within normal limits.  No evidence of bowel wall thickening, distention, or inflammatory changes. Vascular/Lymphatic: Aortic atherosclerosis. No pathologically enlarged abdominal or pelvic lymph nodes. Reproductive: Prostate is unremarkable. Other: No significant abdominopelvic free fluid. Musculoskeletal: Similar mixed lytic and sclerotic osseous metastatic disease with pathologic fracture of the left ilium. No new aggressive lytic or blastic lesion of bone. IMPRESSION: 1. Stable spiculated right upper lobe pulmonary nodule. 2. Similar mixed lytic and sclerotic osseous metastatic disease with pathologic fractures of the manubrium and T8 vertebral body. 3. No evidence of new or progressive metastatic disease in the chest, abdomen or  pelvis. Electronically Signed   By: Tama Fails M.D.   On: 01/23/2024 15:00     ASSESSMENT AND PLAN: This is a very pleasant 58 years old white male with stage IV (T3, N2, M1 C) non-small cell lung cancer, adenocarcinoma presented with large right upper lobe lung mass in addition to right hilar and subcarinal lymphadenopathy in addition to multiple metastatic bone lesions involving the pelvis as well as the lower thoracic and lumbar spines in addition to metastatic disease to the pelvic muscles diagnosed in February 2024.  Molecular studies showed positive KRAS G12C mutation and PD-L1 expression of 98%. The patient is here today to start the first cycle of systemic chemotherapy with carboplatin  for AUC of 5, Alimta 500 Mg/M2 and Keytruda  200 Mg IV every 3 weeks.  First dose November 14, 2022.  Status post 22 cycles.  Starting from cycle #5 the patient is on maintenance treatment with Alimta and Keytruda  every 3 weeks.  He has been tolerating this treatment fairly well. Assessment and Plan    Stage 4 non-small cell lung cancer, adenocarcinoma Stage 4 non-small cell lung cancer, adenocarcinoma with KRAS G12C mutation and PD-L1 expression of 98%. On maintenance treatment with Alimta and Keytruda  every three weeks. Completed 22 cycles, preparing for cycle 23. Recent respiratory infection managed with prednisone  and antibiotics. No new complaints or significant changes. Lab work supports treatment continuation. Keytruda  can be administered up to 35 cycles, Alimta can continue indefinitely if effective. - Proceed with cycle 23 of Alimta and Keytruda . - Ensure prednisone  dose is 10 mg or less to avoid interference with immunotherapy.  Respiratory infection Recent respiratory infection treated with antibiotics and prednisone . Currently on second round of prednisone . Cough with tan sputum, no hemoptysis. Prednisone  dose must be monitored to ensure it does not exceed 10 mg to avoid interference with cancer  treatment. - Verify current prednisone  dose and adjust to 10 mg or less if necessary.  Deep vein thrombosis of right lower extremity Deep vein thrombosis in the right lower extremity diagnosed in April 2024, managed with Eliquis .   The patient was advised to call immediately if he has any other concerning symptoms in the interval. The patient voices understanding of current disease status and treatment options and is in agreement with the current care plan.  All questions were answered. The patient knows to call the clinic with any problems, questions or concerns. We can certainly see the patient much sooner if necessary.  The total time spent in the appointment was 30 minutes.  Disclaimer: This note was dictated with voice recognition software. Similar sounding words can inadvertently be transcribed and may not be corrected upon review.

## 2024-02-20 ENCOUNTER — Encounter: Payer: Self-pay | Admitting: Nurse Practitioner

## 2024-02-20 ENCOUNTER — Encounter: Payer: Self-pay | Admitting: Internal Medicine

## 2024-02-20 NOTE — Progress Notes (Signed)
 Palliative Medicine [facility]  Telephone:(336) [phone] Fax:(336) [phone]   Name: Maxwell Grant Date: 02/20/2024 MRN: [id number]  DOB: 1966/07/29  Patient Care Team: Alfredia Ina, MD as PCP - General (Family Medicine)    INTERVAL HISTORY: Maxwell Grant is a 58 y.o. male with oncologic medical history including new diagnosed non-small cell lung cancer (10/2022), as well as a history of COPD, diabetes mellitus, hypertension, dyslipidemia as well as history of osteomyelitis of the back.  Palliative ask to see for symptom and pain management and goals of care.   SOCIAL HISTORY:     reports that he has been smoking cigarettes. He has never used smokeless tobacco. He reports current alcohol use of about 1.0 standard drink of alcohol per week. He reports current drug use. Drug: Marijuana.  ADVANCE DIRECTIVES:  None on file   CODE STATUS: Full code  PAST MEDICAL HISTORY: Past Medical History:  Diagnosis Date   CHF (congestive heart failure) (HCC)    COPD (chronic obstructive pulmonary disease) (HCC)    Diabetes mellitus without complication (HCC)    Dyspnea    Hypertension    Lung cancer (HCC) 10/2022   Right leg DVT (HCC) 12/21/2022    ALLERGIES:  is allergic to statins, aspirin, flexeril [cyclobenzaprine], and penicillins.  MEDICATIONS:  Current Outpatient Medications  Medication Sig Dispense Refill   Accu-Chek FastClix Lancets MISC Use to test blood sugar 4 times a day 102 each 0   albuterol  (PROVENTIL ) (2.5 MG/3ML) 0.083% nebulizer solution Inhale 3 mL (2.5 mg total) by nebulization every 6 (six) hours as needed for wheezing. 360 mL 3   albuterol  (VENTOLIN  HFA) 108 (90 Base) MCG/ACT inhaler Inhale 1-2 puffs into the lungs every 6 (six) hours as needed for wheezing or shortness of breath. 18 g 1   albuterol  (VENTOLIN  HFA) 108 (90 Base) MCG/ACT inhaler Inhale 2 puffs every 6 (six) hours as needed for wheezing. 54 g 3   apixaban  (ELIQUIS ) 5 MG TABS tablet  Take 1 tablet (5 mg total) by mouth 2 (two) times daily. 60 tablet 4   Azelastine  HCl 137 MCG/SPRAY SOLN Place 2 sprays into nostrils 2 (two) times daily. 30 mL 12   benzonatate  (TESSALON ) 100 MG capsule Take 1 capsule (100 mg total) by mouth 3 (three) times daily as needed. 30 capsule 2   fluticasone  (FLONASE ) 50 MCG/ACT nasal spray Place 1 spray into both nostrils daily. 16 g 2   Fluticasone -Umeclidin-Vilant (TRELEGY ELLIPTA ) 100-62.5-25 MCG/ACT AEPB Inhale 1 puff into the lungs daily in the afternoon. 60 each 5   folic acid  (FOLVITE ) 1 MG tablet Take 1 tablet (1 mg total) by mouth daily. 90 tablet 1   furosemide  (LASIX ) 20 MG tablet Take 1 tablet (20 mg) by mouth daily as needed. 7 tablet 0   gabapentin  (NEURONTIN ) 300 MG capsule Take 2 capsules (600mg ) in the morning, 1 capsule (300 mg ) midday, and 2 capsules at bedtime (600mg ). 150 capsule 2   glimepiride (AMARYL) 2 MG tablet Take 2 mg by mouth in the morning.     lidocaine -prilocaine  (EMLA ) cream Apply to [facility] Prior to Access as needed 30 g 2   meloxicam  (MOBIC ) 7.5 MG tablet Take 1 tablet (7.5 mg total) by mouth 2 (two) times daily. 60 tablet 3   ondansetron  (ZOFRAN ) 8 MG tablet Take 1 tablet (8 mg) by mouth every 8 hours as needed for nausea or vomiting. 30 tablet 2   OVER THE COUNTER MEDICATION Take 1 tablet by  mouth daily as needed (constipation). " Stool softener     oxyCODONE  (OXYCONTIN ) 20 mg 12 hr tablet Take 1 tablet (20 mg total) by mouth every 12 (twelve) hours. 60 tablet 0   oxyCODONE -acetaminophen  (PERCOCET) 7.5-325 MG tablet Take 1 tablet by mouth every 6 (six) hours as needed for severe pain (pain score 7-10). 60 tablet 0   prochlorperazine  (COMPAZINE ) 10 MG tablet Take 1 tablet (10 mg total) by mouth every 6 (six) hours as needed for nausea or vomiting. 30 tablet 0   TRELEGY ELLIPTA  100-62.5-25 MCG/ACT AEPB Inhale 1 puff once daily. 180 each 3   No current facility-administered medications for this visit.    VITAL  SIGNS: There were no vitals taken for this visit. There were no vitals filed for this visit.  Estimated body mass index is 31.18 kg/m as calculated from the following:   Height as of 01/29/24: 5\' 6"  (1.676 m).   Weight as of an earlier encounter on 02/19/24: 193 lb 3.2 oz (87.6 kg).  PERFORMANCE STATUS (ECOG) : 1 - Symptomatic but completely ambulatory  Physical Exam General: NAD Cardiovascular: regular rate and rhythm Pulmonary: normal breathing pattern Extremities: no edema, no joint deformities Skin: no rashes Neurological: AAO x3  IMPRESSION: Discussed the use of AI scribe software for clinical note transcription with the patient, who gave verbal consent to proceed.  History of Present Illness Maxwell Grant "[name]" is a 58 year old male who presents for a routine follow-up. No acute distress. Accompanied by family. He has appetite recently decreased which he attributes to recent upper respiratory infection. Weight down to 193lbs from 197lbs. Feels appetite is slowly improving. No issues with constipation.   He has been experiencing a respiratory infection over the past couple of weeks, which has contributed to feeling 'under the weather.' He describes a breathing episode during this time.   His energy levels have been notably decreased recently. He confirms that he is managing his medications well and does not currently need any refills.  JR is currently taking gabapentin , with a regimen of 300 mg in the morning and 600 mg at night, to manage pain. Additionally, he takes oxycontin  one tablet every twelve hours and has Percocet available as needed. Does not require daily use of breakthrough medication. Reports pain is well controlled. He has not required early refills for his medications, indicating effective pain management with the current regimen. No adjustments to regimen at this time.   All questions answered and support provided.   Assessment & Plan Cancer Related Pain  Management His current regimen is effective. - Ensure timely access to pain medication as per current regimen. -Continue OxyContin  20 mg every 12 hours -Continue oxycodone /acetaminophen  7.5/325 mg every 6 hours as needed.  For breakthrough pain.  Not requiring around-the-clock. -Continue gabapentin  300 mg twice daily and 600 mg at bedtime.   Respiratory infection Recent infection with persistent symptoms causing decreased energy. Decrease in appetite however feels slowly improving.  - Completed medications for URI.   Follow-Up - Follow up on June 24th. - Message provider if issues arise before next appointment.  I will plan to see patient back in 6-8 weeks.  Sooner if needed.  Patient expressed understanding and was in agreement with this plan. He also understands that He can call the clinic at any time with any questions, concerns, or complaints.   Any controlled substances utilized were prescribed in the context of palliative care. PDMP has been reviewed.   Visit consisted of counseling  and education dealing with the complex and emotionally intense issues of symptom management and palliative care in the setting of serious and potentially life-threatening illness.  Dellia Ferguson, AGPCNP-BC  Palliative Medicine Team/[facility]

## 2024-03-06 ENCOUNTER — Other Ambulatory Visit: Payer: Self-pay | Admitting: Nurse Practitioner

## 2024-03-06 DIAGNOSIS — G893 Neoplasm related pain (acute) (chronic): Secondary | ICD-10-CM

## 2024-03-06 DIAGNOSIS — J309 Allergic rhinitis, unspecified: Secondary | ICD-10-CM

## 2024-03-06 DIAGNOSIS — C3490 Malignant neoplasm of unspecified part of unspecified bronchus or lung: Secondary | ICD-10-CM

## 2024-03-06 DIAGNOSIS — Z515 Encounter for palliative care: Secondary | ICD-10-CM

## 2024-03-06 DIAGNOSIS — M25552 Pain in left hip: Secondary | ICD-10-CM

## 2024-03-07 ENCOUNTER — Other Ambulatory Visit: Payer: Self-pay

## 2024-03-07 ENCOUNTER — Other Ambulatory Visit (HOSPITAL_COMMUNITY): Payer: Self-pay

## 2024-03-07 MED ORDER — FLUTICASONE PROPIONATE 50 MCG/ACT NA SUSP
1.0000 | Freq: Every day | NASAL | 2 refills | Status: DC
  Filled 2024-03-07: qty 16, 30d supply, fill #0
  Filled 2024-04-08: qty 16, 30d supply, fill #1
  Filled 2024-05-12: qty 16, 30d supply, fill #2

## 2024-03-07 MED ORDER — OXYCODONE HCL ER 20 MG PO T12A
20.0000 mg | EXTENDED_RELEASE_TABLET | Freq: Two times a day (BID) | ORAL | 0 refills | Status: DC
  Filled 2024-03-07: qty 60, 30d supply, fill #0

## 2024-03-07 MED ORDER — OXYCODONE-ACETAMINOPHEN 7.5-325 MG PO TABS
1.0000 | ORAL_TABLET | Freq: Four times a day (QID) | ORAL | 0 refills | Status: DC | PRN
  Filled 2024-03-07: qty 60, 15d supply, fill #0

## 2024-03-07 MED ORDER — FOLIC ACID 1 MG PO TABS
1.0000 mg | ORAL_TABLET | Freq: Every day | ORAL | 1 refills | Status: DC
  Filled 2024-03-07 – 2024-04-17 (×2): qty 90, 90d supply, fill #0
  Filled 2024-07-23: qty 90, 90d supply, fill #1

## 2024-03-07 MED ORDER — MELOXICAM 7.5 MG PO TABS
7.5000 mg | ORAL_TABLET | Freq: Two times a day (BID) | ORAL | 3 refills | Status: DC
  Filled 2024-03-07: qty 60, 30d supply, fill #0
  Filled 2024-04-17: qty 60, 30d supply, fill #1
  Filled 2024-05-17: qty 60, 30d supply, fill #2
  Filled 2024-06-18: qty 60, 30d supply, fill #3

## 2024-03-11 ENCOUNTER — Inpatient Hospital Stay

## 2024-03-11 ENCOUNTER — Inpatient Hospital Stay (HOSPITAL_BASED_OUTPATIENT_CLINIC_OR_DEPARTMENT_OTHER): Admitting: Internal Medicine

## 2024-03-11 ENCOUNTER — Inpatient Hospital Stay: Admitting: Nurse Practitioner

## 2024-03-11 ENCOUNTER — Encounter: Payer: Self-pay | Admitting: Nurse Practitioner

## 2024-03-11 VITALS — BP 110/66 | HR 67 | Temp 98.0°F | Resp 16 | Ht 66.0 in | Wt 193.7 lb

## 2024-03-11 DIAGNOSIS — R530 Neoplastic (malignant) related fatigue: Secondary | ICD-10-CM | POA: Diagnosis not present

## 2024-03-11 DIAGNOSIS — C3490 Malignant neoplasm of unspecified part of unspecified bronchus or lung: Secondary | ICD-10-CM

## 2024-03-11 DIAGNOSIS — Z515 Encounter for palliative care: Secondary | ICD-10-CM

## 2024-03-11 DIAGNOSIS — Z5112 Encounter for antineoplastic immunotherapy: Secondary | ICD-10-CM | POA: Diagnosis not present

## 2024-03-11 DIAGNOSIS — Z95828 Presence of other vascular implants and grafts: Secondary | ICD-10-CM

## 2024-03-11 DIAGNOSIS — G893 Neoplasm related pain (acute) (chronic): Secondary | ICD-10-CM | POA: Diagnosis not present

## 2024-03-11 LAB — CBC WITH DIFFERENTIAL (CANCER CENTER ONLY)
Abs Immature Granulocytes: 0 10*3/uL (ref 0.00–0.07)
Basophils Absolute: 0 10*3/uL (ref 0.0–0.1)
Basophils Relative: 1 %
Eosinophils Absolute: 0.2 10*3/uL (ref 0.0–0.5)
Eosinophils Relative: 6 %
HCT: 36.2 % — ABNORMAL LOW (ref 39.0–52.0)
Hemoglobin: 11.5 g/dL — ABNORMAL LOW (ref 13.0–17.0)
Immature Granulocytes: 0 %
Lymphocytes Relative: 10 %
Lymphs Abs: 0.4 10*3/uL — ABNORMAL LOW (ref 0.7–4.0)
MCH: 30.3 pg (ref 26.0–34.0)
MCHC: 31.8 g/dL (ref 30.0–36.0)
MCV: 95.5 fL (ref 80.0–100.0)
Monocytes Absolute: 0.4 10*3/uL (ref 0.1–1.0)
Monocytes Relative: 9 %
Neutro Abs: 2.8 10*3/uL (ref 1.7–7.7)
Neutrophils Relative %: 74 %
Platelet Count: 164 10*3/uL (ref 150–400)
RBC: 3.79 MIL/uL — ABNORMAL LOW (ref 4.22–5.81)
RDW: 15.2 % (ref 11.5–15.5)
WBC Count: 3.8 10*3/uL — ABNORMAL LOW (ref 4.0–10.5)
nRBC: 0 % (ref 0.0–0.2)

## 2024-03-11 LAB — CMP (CANCER CENTER ONLY)
ALT: 8 U/L (ref 0–44)
AST: 12 U/L — ABNORMAL LOW (ref 15–41)
Albumin: 3.8 g/dL (ref 3.5–5.0)
Alkaline Phosphatase: 70 U/L (ref 38–126)
Anion gap: 3 — ABNORMAL LOW (ref 5–15)
BUN: 19 mg/dL (ref 6–20)
CO2: 37 mmol/L — ABNORMAL HIGH (ref 22–32)
Calcium: 9 mg/dL (ref 8.9–10.3)
Chloride: 100 mmol/L (ref 98–111)
Creatinine: 0.92 mg/dL (ref 0.61–1.24)
GFR, Estimated: >60 mL/min (ref >60)
Glucose, Bld: 110 mg/dL — ABNORMAL HIGH (ref 70–99)
Potassium: 4.1 mmol/L (ref 3.5–5.1)
Sodium: 140 mmol/L (ref 135–145)
Total Bilirubin: 0.7 mg/dL (ref 0.0–1.2)
Total Protein: 6.8 g/dL (ref 6.5–8.1)

## 2024-03-11 LAB — TSH: TSH: 1.72 u[IU]/mL (ref 0.350–4.500)

## 2024-03-11 MED ORDER — CYANOCOBALAMIN 1000 MCG/ML IJ SOLN
1000.0000 ug | Freq: Once | INTRAMUSCULAR | Status: AC
  Administered 2024-03-11: 1000 ug via INTRAMUSCULAR
  Filled 2024-03-11: qty 1

## 2024-03-11 MED ORDER — SODIUM CHLORIDE 0.9 % IV SOLN
200.0000 mg | Freq: Once | INTRAVENOUS | Status: AC
  Administered 2024-03-11: 200 mg via INTRAVENOUS
  Filled 2024-03-11: qty 200

## 2024-03-11 MED ORDER — SODIUM CHLORIDE 0.9 % IV SOLN: Freq: Once | INTRAVENOUS | Status: AC

## 2024-03-11 MED ORDER — SODIUM CHLORIDE 0.9% FLUSH
10.0000 mL | Freq: Once | INTRAVENOUS | Status: AC
  Administered 2024-03-11: 10 mL

## 2024-03-11 MED ORDER — SODIUM CHLORIDE 0.9% FLUSH
10.0000 mL | INTRAVENOUS | Status: DC | PRN
  Administered 2024-03-11: 10 mL

## 2024-03-11 MED ORDER — PROCHLORPERAZINE MALEATE 10 MG PO TABS
10.0000 mg | ORAL_TABLET | Freq: Once | ORAL | Status: AC
  Administered 2024-03-11: 10 mg via ORAL
  Filled 2024-03-11: qty 1

## 2024-03-11 MED ORDER — SODIUM CHLORIDE 0.9 % IV SOLN
500.0000 mg/m2 | Freq: Once | INTRAVENOUS | Status: AC
  Administered 2024-03-11: 1000 mg via INTRAVENOUS
  Filled 2024-03-11: qty 40

## 2024-03-11 MED ORDER — HEPARIN SOD (PORK) LOCK FLUSH 100 UNIT/ML IV SOLN
500.0000 [IU] | Freq: Once | INTRAVENOUS | Status: AC | PRN
  Administered 2024-03-11: 500 [IU]

## 2024-03-11 NOTE — Patient Instructions (Signed)
 [facility] - A DEPT OF [facility]  Discharge Instructions: Thank you for choosing [facility] to provide your oncology and hematology care.   If you have a lab appointment with the Cancer Center, please go directly to the Cancer Center and check in at the registration area.   Wear comfortable clothing and clothing appropriate for easy access to any Portacath or PICC line.   We strive to give you quality time with your provider. You may need to reschedule your appointment if you arrive late (15 or more minutes).  Arriving late affects you and other patients whose appointments are after yours.  Also, if you miss three or more appointments without notifying the office, you may be dismissed from the clinic at the provider's discretion.      For prescription refill requests, have your pharmacy contact our office and allow 72 hours for refills to be completed.    Today you received the following chemotherapy and/or immunotherapy agents: Keytruda, Alimta.       To help prevent nausea and vomiting after your treatment, we encourage you to take your nausea medication as directed.  BELOW ARE SYMPTOMS THAT SHOULD BE REPORTED IMMEDIATELY: *FEVER GREATER THAN 100.4 F (38 C) OR HIGHER *CHILLS OR SWEATING *NAUSEA AND VOMITING THAT IS NOT CONTROLLED WITH YOUR NAUSEA MEDICATION *UNUSUAL SHORTNESS OF BREATH *UNUSUAL BRUISING OR BLEEDING *URINARY PROBLEMS (pain or burning when urinating, or frequent urination) *BOWEL PROBLEMS (unusual diarrhea, constipation, pain near the anus) TENDERNESS IN MOUTH AND THROAT WITH OR WITHOUT PRESENCE OF ULCERS (sore throat, sores in mouth, or a toothache) UNUSUAL RASH, SWELLING OR PAIN  UNUSUAL VAGINAL DISCHARGE OR ITCHING   Items with * indicate a potential emergency and should be followed up as soon as possible or go to the Emergency Department if any problems should occur.  Please show the CHEMOTHERAPY ALERT CARD or  IMMUNOTHERAPY ALERT CARD at check-in to the Emergency Department and triage nurse.  Should you have questions after your visit or need to cancel or reschedule your appointment, please contact [facility] - A DEPT OF [facility]  Dept: [phone]  and follow the prompts.  Office hours are 8:00 a.m. to 4:30 p.m. Monday - Friday. Please note that voicemails left after 4:00 p.m. may not be returned until the following business day.  We are closed weekends and major holidays. You have access to a nurse at all times for urgent questions. Please call the main number to the clinic Dept: [phone] and follow the prompts.   For any non-urgent questions, you may also contact your provider using MyChart. We now offer e-Visits for anyone 61 and older to request care online for non-urgent symptoms. For details visit [email or web address].   Also download the MyChart app! Go to the app store, search "MyChart", open the app, select , and log in with your MyChart username and password.

## 2024-03-11 NOTE — Progress Notes (Signed)
 Palliative Medicine [facility]  Telephone:([phone] Fax:(336) [phone]   Name: Maxwell Grant Date: 03/11/2024 MRN: [id number]  DOB: [birth year]  Patient Care Team: Pura Lenis, MD as PCP - General (Family Medicine)    INTERVAL HISTORY: Maxwell Grant is a 58 y.o. male with oncologic medical history including new diagnosed non-small cell lung cancer (10/2022), as well as a history of COPD, diabetes mellitus, hypertension, dyslipidemia as well as history of osteomyelitis of the back.  Palliative ask to see for symptom and pain management and goals of care.   SOCIAL HISTORY:     reports that he has been smoking cigarettes. He has never used smokeless tobacco. He reports current alcohol use of about 1.0 standard drink of alcohol per week. He reports current drug use. Drug: Marijuana.  ADVANCE DIRECTIVES:  None on file   CODE STATUS: Full code  PAST MEDICAL HISTORY: Past Medical History:  Diagnosis Date   CHF (congestive heart failure) (HCC)    COPD (chronic obstructive pulmonary disease) (HCC)    Diabetes mellitus without complication (HCC)    Dyspnea    Hypertension    Lung cancer (HCC) 10/2022   Right leg DVT (HCC) 12/21/2022    ALLERGIES:  is allergic to statins, aspirin, flexeril [cyclobenzaprine], and penicillins.  MEDICATIONS:  Current Outpatient Medications  Medication Sig Dispense Refill   Accu-Chek FastClix Lancets MISC Use to test blood sugar 4 times a day 102 each 0   albuterol  (PROVENTIL ) (2.5 MG/3ML) 0.083% nebulizer solution Inhale 3 mL (2.5 mg total) by nebulization every 6 (six) hours as needed for wheezing. 360 mL 3   albuterol  (VENTOLIN  HFA) 108 (90 Base) MCG/ACT inhaler Inhale 1-2 puffs into the lungs every 6 (six) hours as needed for wheezing or shortness of breath. 18 g 1   albuterol  (VENTOLIN  HFA) 108 (90 Base) MCG/ACT inhaler Inhale 2 puffs every 6 (six) hours as needed for wheezing. 54 g 3   apixaban  (ELIQUIS ) 5 MG TABS tablet  Take 1 tablet (5 mg total) by mouth 2 (two) times daily. 60 tablet 4   Azelastine  HCl 137 MCG/SPRAY SOLN Place 2 sprays into nostrils 2 (two) times daily. 30 mL 12   benzonatate  (TESSALON ) 100 MG capsule Take 1 capsule (100 mg total) by mouth 3 (three) times daily as needed. 30 capsule 2   fluticasone  (FLONASE ) 50 MCG/ACT nasal spray Place 1 spray into both nostrils daily. 16 g 2   Fluticasone -Umeclidin-Vilant (TRELEGY ELLIPTA ) 100-62.5-25 MCG/ACT AEPB Inhale 1 puff into the lungs daily in the afternoon. 60 each 5   folic acid  (FOLVITE ) 1 MG tablet Take 1 tablet (1 mg total) by mouth daily. 90 tablet 1   furosemide  (LASIX ) 20 MG tablet Take 1 tablet (20 mg) by mouth daily as needed. 7 tablet 0   gabapentin  (NEURONTIN ) 300 MG capsule Take 2 capsules (600mg ) in the morning, 1 capsule (300 mg ) midday, and 2 capsules at bedtime (600mg ). 150 capsule 2   glimepiride (AMARYL) 2 MG tablet Take 2 mg by mouth in the morning.     lidocaine -prilocaine  (EMLA ) cream Apply to [facility] Prior to Access as needed 30 g 2   meloxicam  (MOBIC ) 7.5 MG tablet Take 1 tablet (7.5 mg total) by mouth 2 (two) times daily. 60 tablet 3   ondansetron  (ZOFRAN ) 8 MG tablet Take 1 tablet (8 mg) by mouth every 8 hours as needed for nausea or vomiting. 30 tablet 2   OVER THE COUNTER MEDICATION Take 1 tablet by  mouth daily as needed (constipation).  Stool softener     oxyCODONE  (OXYCONTIN ) 20 mg 12 hr tablet Take 1 tablet (20 mg total) by mouth every 12 (twelve) hours. 60 tablet 0   oxyCODONE -acetaminophen  (PERCOCET) 7.5-325 MG tablet Take 1 tablet by mouth every 6 (six) hours as needed for severe pain (pain score 7-10). 60 tablet 0   prochlorperazine  (COMPAZINE ) 10 MG tablet Take 1 tablet (10 mg total) by mouth every 6 (six) hours as needed for nausea or vomiting. 30 tablet 0   TRELEGY ELLIPTA  100-62.5-25 MCG/ACT AEPB Inhale 1 puff once daily. 180 each 3   No current facility-administered medications for this visit.    VITAL  SIGNS: There were no vitals taken for this visit. There were no vitals filed for this visit.  Estimated body mass index is 31.26 kg/m as calculated from the following:   Height as of an earlier encounter on 03/11/24: 5' 6 (1.676 m).   Weight as of an earlier encounter on 03/11/24: 193 lb 11.2 oz (87.9 kg).  PERFORMANCE STATUS (ECOG) : 1 - Symptomatic but completely ambulatory  Physical Exam General: NAD Cardiovascular: regular rate and rhythm Pulmonary: normal breathing pattern Extremities: no edema, no joint deformities Skin: no rashes Neurological: AAO x3  IMPRESSION: Discussed the use of AI scribe software for clinical note transcription with the patient, who gave verbal consent to proceed.  History of Present Illness Maxwell Grant is a 58 year old male who presents for a routine follow-up. No acute distress. Accompanied by family. Reports he is doing well overall. Appetite is slowly improving. Weight is stable at 193lbs. denies nausea, vomiting, constipation, or diarrhea.  Occasional fatigue however he remains active as possible.  JR is currently taking gabapentin , with a regimen of 300 mg in the morning and 600 mg at night, to manage pain. Additionally, he takes oxycontin  one tablet every twelve hours and has Percocet available as needed. Does not require daily use of breakthrough medication. Reports pain is well controlled. He has not required early refills for his medications, indicating effective pain management with the current regimen. No adjustments to regimen at this time.   All questions answered and support provided.   Assessment & Plan Cancer Related Pain Management His current regimen is effective. - Ensure timely access to pain medication as per current regimen. -Continue OxyContin  20 mg every 12 hours -Continue oxycodone /acetaminophen  7.5/325 mg every 6 hours as needed.  For breakthrough pain.  Not requiring around-the-clock. -Continue gabapentin  300 mg twice  daily and 600 mg at bedtime.   I will plan to see patient back in 6-8 weeks.  Sooner if needed.  Patient expressed understanding and was in agreement with this plan. He also understands that He can call the clinic at any time with any questions, concerns, or complaints.   Any controlled substances utilized were prescribed in the context of palliative care. PDMP has been reviewed.   Visit consisted of counseling and education dealing with the complex and emotionally intense issues of symptom management and palliative care in the setting of serious and potentially life-threatening illness.  Levon Borer, AGPCNP-BC  Palliative Medicine Team/[facility]

## 2024-03-11 NOTE — Progress Notes (Signed)
 [facility] [phone]   Fax:(336) [phone]  OFFICE PROGRESS NOTE  Pura Lenis, MD [address] [place] [address]  DIAGNOSIS:  1) Stage IV (T3, N2, M1 C) non-small cell lung cancer, adenocarcinoma presented with large right upper lobe lung mass in addition to right hilar and subcarinal lymphadenopathy in addition to multiple metastatic bone lesions involving the pelvis as well as the lower thoracic and lumbar spines in addition to metastatic disease to the pelvic muscles diagnosed in February 2024.  2) deep venous thrombosis of the right lower extremity diagnosed on December 18, 2022  Detected Alteration(s) / Biomarker(s) Associated FDA-approved therapies Clinical Trial Availability% cfDNA or Amplification  KRAS G12C approved by FDA Adagrasib, Sotorasib Yes 29.3%  CDK4 Amplification None Yes High (+++)Plasma Copy Number: 8.0  PD-L1 expression 98%  PRIOR THERAPY: None  CURRENT THERAPY:  1) Systemic chemotherapy with carboplatin  for AUC of 5, Alimta 500 Mg/M2 and Keytruda  200 Mg IV every 3 weeks.  First dose November 14, 2022.  Status post 23 cycles.  Starting from cycle #5 the patient is on maintenance treatment with Alimta and Keytruda  every 3 weeks. 2) Eliquis  10 mg p.o. twice daily for 7 days followed by 5 mg p.o. twice daily.  First dose 12/26/2022.  INTERVAL HISTORY: Maxwell Grant 58 y.o. male returns to the clinic today for follow-up visit after by his sister. Discussed the use of AI scribe software for clinical note transcription with the patient, who gave verbal consent to proceed.  History of Present Illness   Maxwell Grant is a 58 year old male with stage 4 non-small cell lung cancer who presents for evaluation for starting cycle number 24 of maintenance treatment. He is accompanied by his sister.  Diagnosed with stage 4 non-small cell lung cancer in February 2024, he has a KRAS G12C mutation and PD-L1 expression  of 98%. Initially, he underwent systemic chemotherapy with carboplatin , Alimta, and Keytruda  every three weeks for four cycles. Since then, he has been on maintenance treatment with Alimta and Keytruda  every three weeks, having completed 23 cycles.  He reports no new symptoms since his last visit three weeks ago. No chest pain, breathing issues, nausea, vomiting, weight loss, or night sweats, although he mentions a slight intentional weight loss. He recently started a new nebulizer medication for COPD, which seems to be effective.  His current medication regimen includes Alimta and Keytruda  as part of his maintenance treatment for lung cancer.        MEDICAL HISTORY: Past Medical History:  Diagnosis Date   CHF (congestive heart failure) (HCC)    COPD (chronic obstructive pulmonary disease) (HCC)    Diabetes mellitus without complication (HCC)    Dyspnea    Hypertension    Lung cancer (HCC) 10/2022   Right leg DVT (HCC) 12/21/2022    ALLERGIES:  is allergic to statins, aspirin, flexeril [cyclobenzaprine], and penicillins.  MEDICATIONS:  Current Outpatient Medications  Medication Sig Dispense Refill   Accu-Chek FastClix Lancets MISC Use to test blood sugar 4 times a day 102 each 0   albuterol  (PROVENTIL ) (2.5 MG/3ML) 0.083% nebulizer solution Inhale 3 mL (2.5 mg total) by nebulization every 6 (six) hours as needed for wheezing. 360 mL 3   albuterol  (VENTOLIN  HFA) 108 (90 Base) MCG/ACT inhaler Inhale 1-2 puffs into the lungs every 6 (six) hours as needed for wheezing or shortness of breath. 18 g 1   albuterol  (VENTOLIN  HFA) 108 (  90 Base) MCG/ACT inhaler Inhale 2 puffs every 6 (six) hours as needed for wheezing. 54 g 3   apixaban  (ELIQUIS ) 5 MG TABS tablet Take 1 tablet (5 mg total) by mouth 2 (two) times daily. 60 tablet 4   Azelastine  HCl 137 MCG/SPRAY SOLN Place 2 sprays into nostrils 2 (two) times daily. 30 mL 12   benzonatate  (TESSALON ) 100 MG capsule Take 1 capsule (100 mg total) by  mouth 3 (three) times daily as needed. 30 capsule 2   fluticasone  (FLONASE ) 50 MCG/ACT nasal spray Place 1 spray into both nostrils daily. 16 g 2   Fluticasone -Umeclidin-Vilant (TRELEGY ELLIPTA ) 100-62.5-25 MCG/ACT AEPB Inhale 1 puff into the lungs daily in the afternoon. 60 each 5   folic acid  (FOLVITE ) 1 MG tablet Take 1 tablet (1 mg total) by mouth daily. 90 tablet 1   furosemide  (LASIX ) 20 MG tablet Take 1 tablet (20 mg) by mouth daily as needed. 7 tablet 0   gabapentin  (NEURONTIN ) 300 MG capsule Take 2 capsules (600mg ) in the morning, 1 capsule (300 mg ) midday, and 2 capsules at bedtime (600mg ). 150 capsule 2   glimepiride (AMARYL) 2 MG tablet Take 2 mg by mouth in the morning.     lidocaine -prilocaine  (EMLA ) cream Apply to [facility] Prior to Access as needed 30 g 2   meloxicam  (MOBIC ) 7.5 MG tablet Take 1 tablet (7.5 mg total) by mouth 2 (two) times daily. 60 tablet 3   ondansetron  (ZOFRAN ) 8 MG tablet Take 1 tablet (8 mg) by mouth every 8 hours as needed for nausea or vomiting. 30 tablet 2   OVER THE COUNTER MEDICATION Take 1 tablet by mouth daily as needed (constipation).  Stool softener     oxyCODONE  (OXYCONTIN ) 20 mg 12 hr tablet Take 1 tablet (20 mg total) by mouth every 12 (twelve) hours. 60 tablet 0   oxyCODONE -acetaminophen  (PERCOCET) 7.5-325 MG tablet Take 1 tablet by mouth every 6 (six) hours as needed for severe pain (pain score 7-10). 60 tablet 0   prochlorperazine  (COMPAZINE ) 10 MG tablet Take 1 tablet (10 mg total) by mouth every 6 (six) hours as needed for nausea or vomiting. 30 tablet 0   TRELEGY ELLIPTA  100-62.5-25 MCG/ACT AEPB Inhale 1 puff once daily. 180 each 3   No current facility-administered medications for this visit.    SURGICAL HISTORY:  Past Surgical History:  Procedure Laterality Date   BRONCHIAL NEEDLE ASPIRATION BIOPSY  10/30/2022   Procedure: BRONCHIAL NEEDLE ASPIRATION BIOPSIES;  Surgeon: Shelah Lamar RAMAN, MD;  Location: [facility] ENDOSCOPY;  Service:  Cardiopulmonary;;   IR IMAGING GUIDED PORT INSERTION  11/17/2022   NO PAST SURGERIES     RADIOLOGY WITH ANESTHESIA  10/30/2022   Procedure: MRI WITH ANESTHESIA W/WO CONTRAST;  Surgeon: Shelah Lamar RAMAN, MD;  Location: [facility] ENDOSCOPY;  Service: Cardiopulmonary;;   VIDEO BRONCHOSCOPY WITH ENDOBRONCHIAL ULTRASOUND Right 10/30/2022   Procedure: VIDEO BRONCHOSCOPY WITH ENDOBRONCHIAL ULTRASOUND;  Surgeon: Shelah Lamar RAMAN, MD;  Location: [facility] ENDOSCOPY;  Service: Cardiopulmonary;  Laterality: Right;    REVIEW OF SYSTEMS:  A comprehensive review of systems was negative.   PHYSICAL EXAMINATION: General appearance: alert, cooperative, and no distress Head: Normocephalic, without obvious abnormality, atraumatic Neck: no adenopathy, no JVD, supple, symmetrical, trachea midline, and thyroid  not enlarged, symmetric, no tenderness/mass/nodules Lymph nodes: Cervical, supraclavicular, and axillary nodes normal. Resp: clear to auscultation bilaterally Back: symmetric, no curvature. ROM normal. No CVA tenderness. Cardio: regular rate and rhythm, S1, S2 normal, no murmur, click, rub or gallop GI:  soft, non-tender; bowel sounds normal; no masses,  no organomegaly Extremities: extremities normal, atraumatic, no cyanosis or edema  ECOG PERFORMANCE STATUS: 1 - Symptomatic but completely ambulatory  Blood pressure 110/66, pulse 67, temperature 98 F (36.7 C), temperature source Oral, resp. rate 16, height 5' 6 (1.676 m), weight 193 lb 11.2 oz (87.9 kg), SpO2 94%.  LABORATORY DATA: Lab Results  Component Value Date   WBC 5.2 02/19/2024   HGB 11.6 (L) 02/19/2024   HCT 37.9 (L) 02/19/2024   MCV 98.2 02/19/2024   PLT 166 02/19/2024      Chemistry      Component Value Date/Time   NA 143 02/19/2024 0828   K 4.2 02/19/2024 0828   CL 100 02/19/2024 0828   CO2 40 (H) 02/19/2024 0828   BUN 19 02/19/2024 0828   CREATININE 0.82 02/19/2024 0828      Component Value Date/Time   CALCIUM  8.7 (L) 02/19/2024 0828    ALKPHOS 59 02/19/2024 0828   AST 11 (L) 02/19/2024 0828   ALT 8 02/19/2024 0828   BILITOT 0.6 02/19/2024 0828       RADIOGRAPHIC STUDIES: No results found.    ASSESSMENT AND PLAN: This is a very pleasant 58 years old white male with stage IV (T3, N2, M1 C) non-small cell lung cancer, adenocarcinoma presented with large right upper lobe lung mass in addition to right hilar and subcarinal lymphadenopathy in addition to multiple metastatic bone lesions involving the pelvis as well as the lower thoracic and lumbar spines in addition to metastatic disease to the pelvic muscles diagnosed in February 2024.  Molecular studies showed positive KRAS G12C mutation and PD-L1 expression of 98%. The patient is here today to start the first cycle of systemic chemotherapy with carboplatin  for AUC of 5, Alimta 500 Mg/M2 and Keytruda  200 Mg IV every 3 weeks.  First dose November 14, 2022.  Status post 23 cycles.  Starting from cycle #5 the patient is on maintenance treatment with Alimta and Keytruda  every 3 weeks.  He has been tolerating this treatment fairly well. The patient has been tolerating his treatment fairly well. Assessment and Plan    Stage 4 non-small cell lung cancer Stage 4 non-small cell lung cancer with KRAS G12C mutation and high PD-L1 expression (98%). Currently on maintenance therapy with Alimta and Keytruda  every three weeks, post 23 cycles. No new symptoms reported, overall doing well. Labs pending, anticipated suitable for treatment. Decision to extend interval for next scan to after the next cycle to reduce radiation exposure. - Continue Alimta and Keytruda  maintenance therapy every three weeks. - Order labs and review results for treatment eligibility. - Schedule next scan after the next treatment cycle.  Chronic obstructive pulmonary disease (COPD) COPD management under Dr. Shellia, pulmonologist. Recently started on a new nebulizer medication. No new respiratory symptoms reported,  medication appears effective.   The patient was advised to call immediately if he has any concerning symptoms in the interval. The patient voices understanding of current disease status and treatment options and is in agreement with the current care plan.  All questions were answered. The patient knows to call the clinic with any problems, questions or concerns. We can certainly see the patient much sooner if necessary.  The total time spent in the appointment was 20 minutes.  Disclaimer: This note was dictated with voice recognition software. Similar sounding words can inadvertently be transcribed and may not be corrected upon review.

## 2024-03-12 LAB — T4: T4, Total: 7.4 ug/dL (ref 4.5–12.0)

## 2024-03-19 ENCOUNTER — Other Ambulatory Visit (HOSPITAL_COMMUNITY): Payer: Self-pay

## 2024-03-19 ENCOUNTER — Encounter: Payer: Self-pay | Admitting: Internal Medicine

## 2024-03-24 ENCOUNTER — Encounter: Payer: Self-pay | Admitting: Internal Medicine

## 2024-03-24 ENCOUNTER — Other Ambulatory Visit (HOSPITAL_COMMUNITY): Payer: Self-pay

## 2024-03-25 ENCOUNTER — Other Ambulatory Visit (HOSPITAL_COMMUNITY): Payer: Self-pay

## 2024-03-27 ENCOUNTER — Other Ambulatory Visit (HOSPITAL_COMMUNITY): Payer: Self-pay

## 2024-03-27 ENCOUNTER — Encounter (HOSPITAL_COMMUNITY): Payer: Self-pay

## 2024-03-27 NOTE — Progress Notes (Signed)
 [facility] OFFICE PROGRESS NOTE  Pura Lenis, MD [address] [place] [address]  DIAGNOSIS:  1) Stage IV (T3, N2, M1 C) non-small cell lung cancer, adenocarcinoma presented with large right upper lobe lung mass in addition to right hilar and subcarinal lymphadenopathy in addition to multiple metastatic bone lesions involving the pelvis as well as the lower thoracic and lumbar spines in addition to metastatic disease to the pelvic muscles diagnosed in February 2024.  2) deep venous thrombosis of the right lower extremity diagnosed on December 18, 2022   Detected Alteration(s) / Biomarker(s)         Associated FDA-approved therapies  Clinical Trial Availability% cfDNA or Amplification   KRAS G12C approved by FDA Adagrasib, Sotorasib Yes    29.3%   CDK4 Amplification None Yes            High (+++)Plasma Copy Number: 8.0   PD-L1 expression 98%  PRIOR THERAPY: Palliative radiotherapy to the painful bone metastasis under the care of Dr. Dewey    Radiation to the iliac spine on 12/25/2023  CURRENT THERAPY:  1) Systemic chemotherapy with carboplatin  for AUC of 5, Alimta  500 Mg/M2 and Keytruda  200 Mg IV every 3 weeks.  First dose November 14, 2022.  Status post 24 cycles.  Working from cycle #5 the patient has been on maintenance treatment with Alimta  and Keytruda  every 3 weeks. 2) Eliquis  10 mg p.o. twice daily for 7 days followed by 5 mg p.o. twice daily.  First dose 12/26/2022  INTERVAL HISTORY: Dequon Schnebly 58 y.o. male returns to the clinic today for a follow-up visit accompanied by his sister. The patient was last seen by Dr. Sherrod 3 weeks ago.   He states everything is the same today.   He is currently on OxyContin  20 mg every 12 hours, oxycodone  7.5 mg 1-2x per day, MiraLAX, and Colace. He is currently on Eliquis  for history of DVT in the past. He sees palliative care. He is scheduled to see them today. He also completed palliative radiation to the  back. He does think his back is hurting a little more recently when he stands straight.   He reports his fatigue is stable. He denies any fever. He reports some stable dyspnea on exertion which is unchanged. Denies any chest pain or hemoptysis.  He has an intermittent chronic cough which he also states is the same. He continues to smoke cigarettes. He averages 1 ppd. He denies any significant nausea with treatment. Denies any vomiting, diarrhea, or concerning constipation. Denies any headache or visual changes. Denies any rashes or skin changes. He is compliant with his blood thinner for his history of DVT. He is here today for evaluation and repeat blood work before undergoing cycle #25.    MEDICAL HISTORY: Past Medical History:  Diagnosis Date   CHF (congestive heart failure) (HCC)    COPD (chronic obstructive pulmonary disease) (HCC)    Diabetes mellitus without complication (HCC)    Dyspnea    Hypertension    Lung cancer (HCC) 10/2022   Right leg DVT (HCC) 12/21/2022    ALLERGIES:  is allergic to statins, aspirin, flexeril [cyclobenzaprine], and penicillins.  MEDICATIONS:  Current Outpatient Medications  Medication Sig Dispense Refill   Accu-Chek FastClix Lancets MISC Use to test blood sugar 4 times a day 102 each 0   albuterol  (PROVENTIL ) (2.5 MG/3ML) 0.083% nebulizer solution Inhale 3 mL (2.5 mg total) by nebulization every 6 (six) hours as needed for  wheezing. 360 mL 3   albuterol  (VENTOLIN  HFA) 108 (90 Base) MCG/ACT inhaler Inhale 1-2 puffs into the lungs every 6 (six) hours as needed for wheezing or shortness of breath. 18 g 1   albuterol  (VENTOLIN  HFA) 108 (90 Base) MCG/ACT inhaler Inhale 2 puffs every 6 (six) hours as needed for wheezing. 54 g 3   apixaban  (ELIQUIS ) 5 MG TABS tablet Take 1 tablet (5 mg total) by mouth 2 (two) times daily. 60 tablet 4   Azelastine  HCl 137 MCG/SPRAY SOLN Place 2 sprays into nostrils 2 (two) times daily. 30 mL 12   benzonatate  (TESSALON ) 100 MG  capsule Take 1 capsule (100 mg total) by mouth 3 (three) times daily as needed. 30 capsule 2   fluticasone  (FLONASE ) 50 MCG/ACT nasal spray Place 1 spray into both nostrils daily. 16 g 2   Fluticasone -Umeclidin-Vilant (TRELEGY ELLIPTA ) 100-62.5-25 MCG/ACT AEPB Inhale 1 puff into the lungs daily in the afternoon. 60 each 5   folic acid  (FOLVITE ) 1 MG tablet Take 1 tablet (1 mg total) by mouth daily. 90 tablet 1   furosemide  (LASIX ) 20 MG tablet Take 1 tablet (20 mg) by mouth daily as needed. 7 tablet 0   gabapentin  (NEURONTIN ) 300 MG capsule Take 2 capsules (600mg ) in the morning, 1 capsule (300 mg ) midday, and 2 capsules at bedtime (600mg ). 150 capsule 2   glimepiride (AMARYL) 2 MG tablet Take 2 mg by mouth in the morning.     lidocaine -prilocaine  (EMLA ) cream Apply to [facility] Prior to Access as needed 30 g 2   meloxicam  (MOBIC ) 7.5 MG tablet Take 1 tablet (7.5 mg total) by mouth 2 (two) times daily. 60 tablet 3   ondansetron  (ZOFRAN ) 8 MG tablet Take 1 tablet (8 mg) by mouth every 8 hours as needed for nausea or vomiting. 30 tablet 2   OVER THE COUNTER MEDICATION Take 1 tablet by mouth daily as needed (constipation).  Stool softener     oxyCODONE  (OXYCONTIN ) 20 mg 12 hr tablet Take 1 tablet (20 mg total) by mouth every 12 (twelve) hours. 60 tablet 0   oxyCODONE -acetaminophen  (PERCOCET) 7.5-325 MG tablet Take 1 tablet by mouth every 6 (six) hours as needed for severe pain (pain score 7-10). 60 tablet 0   prochlorperazine  (COMPAZINE ) 10 MG tablet Take 1 tablet (10 mg total) by mouth every 6 (six) hours as needed for nausea or vomiting. 30 tablet 0   TRELEGY ELLIPTA  100-62.5-25 MCG/ACT AEPB Inhale 1 puff once daily. 180 each 3   No current facility-administered medications for this visit.    SURGICAL HISTORY:  Past Surgical History:  Procedure Laterality Date   BRONCHIAL NEEDLE ASPIRATION BIOPSY  10/30/2022   Procedure: BRONCHIAL NEEDLE ASPIRATION BIOPSIES;  Surgeon: Shelah Lamar RAMAN, MD;   Location: [facility] ENDOSCOPY;  Service: Cardiopulmonary;;   IR IMAGING GUIDED PORT INSERTION  11/17/2022   NO PAST SURGERIES     RADIOLOGY WITH ANESTHESIA  10/30/2022   Procedure: MRI WITH ANESTHESIA W/WO CONTRAST;  Surgeon: Shelah Lamar RAMAN, MD;  Location: [facility] ENDOSCOPY;  Service: Cardiopulmonary;;   VIDEO BRONCHOSCOPY WITH ENDOBRONCHIAL ULTRASOUND Right 10/30/2022   Procedure: VIDEO BRONCHOSCOPY WITH ENDOBRONCHIAL ULTRASOUND;  Surgeon: Shelah Lamar RAMAN, MD;  Location: [facility] ENDOSCOPY;  Service: Cardiopulmonary;  Laterality: Right;    REVIEW OF SYSTEMS:   Constitutional: Positive for stable fatigue. Negative for appetite change, chills, fever and unexpected weight change.  HENT: Negative for mouth sores, nosebleeds, sore throat and trouble swallowing.   Eyes: Negative for eye problems and  icterus.  Respiratory: Positive for baseline dyspnea on exertion and stable intermittent cough.  Negative for hemoptysis and wheezing.     Cardiovascular: Negative for chest pain and leg swelling (he wears compression stockings for swelling)..  Gastrointestinal: Negative for abdominal pain, constipation, diarrhea, nausea and vomiting.  Genitourinary: Negative for bladder incontinence, difficulty urinating, dysuria, frequency and hematuria.   Musculoskeletal: Positive for cancer related bone pain. Negative for gait problem, neck pain and neck stiffness.  Skin: Negative for itching and rash.  Neurological: Negative for dizziness, extremity weakness, gait problem, headaches, light-headedness and seizures.  Hematological: Negative for adenopathy. Does not bruise/bleed easily.  Psychiatric/Behavioral: Negative for confusion, depression and sleep disturbance. The patient is not nervous/anxious.  PHYSICAL EXAMINATION:  There were no vitals taken for this visit.  ECOG PERFORMANCE STATUS: 1  Physical Exam  Constitutional: Oriented to person, place, and time and well-developed, well-nourished, and in no distress.  HENT:   Head: Normocephalic and atraumatic.  Mouth/Throat: Oropharynx is clear and moist. No oropharyngeal exudate.  Eyes: Conjunctivae are normal. Right eye exhibits no discharge. Left eye exhibits no discharge. No scleral icterus.  Neck: Normal range of motion. Neck supple.  Cardiovascular: Normal rate, regular rhythm, normal heart sounds and intact distal pulses.   Pulmonary/Chest: Effort normal. Quiet breath sounds bilaterally. No respiratory distress. No wheezes. No rales.  Abdominal: Soft. Bowel sounds are normal. Exhibits no distension and no mass. There is no tenderness.  Musculoskeletal: Normal range of motion. Wearing compression stockings Lymphadenopathy:    No cervical adenopathy.  Neurological: Alert and oriented to person, place, and time. Exhibits muscle wasting. Examined in the wheelchair.  Skin: Skin is warm and dry. No rash noted. Not diaphoretic. No erythema. No pallor.  Psychiatric: Mood, memory and judgment normal.  Vitals reviewed.  LABORATORY DATA: Lab Results  Component Value Date   WBC 3.8 (L) 03/11/2024   HGB 11.5 (L) 03/11/2024   HCT 36.2 (L) 03/11/2024   MCV 95.5 03/11/2024   PLT 164 03/11/2024      Chemistry      Component Value Date/Time   NA 140 03/11/2024 0740   K 4.1 03/11/2024 0740   CL 100 03/11/2024 0740   CO2 37 (H) 03/11/2024 0740   BUN 19 03/11/2024 0740   CREATININE 0.92 03/11/2024 0740      Component Value Date/Time   CALCIUM  9.0 03/11/2024 0740   ALKPHOS 70 03/11/2024 0740   AST 12 (L) 03/11/2024 0740   ALT 8 03/11/2024 0740   BILITOT 0.7 03/11/2024 0740       RADIOGRAPHIC STUDIES:  No results found.   ASSESSMENT/PLAN:  This is a very pleasant 57 years old Caucasian male with stage IV (T3, N2, M1 C) non-small cell lung cancer, adenocarcinoma presented with large right upper lobe lung mass in addition to right hilar and subcarinal lymphadenopathy in addition to multiple metastatic bone lesions involving the pelvis as well as the  lower thoracic and lumbar spines in addition to metastatic disease to the pelvic muscles diagnosed in February 2024.  Molecular studies showed positive KRAS G12C mutation and PD-L1 expression of 98%.   The patient is here today to start the first cycle of systemic chemotherapy with carboplatin  for AUC of 5, Alimta  500 Mg/M2 and Keytruda  200 Mg IV every 3 weeks.  First dose November 14, 2022.  Status post 24 cycles.  Starting from cycle #5 the patient is on maintenance treatment with Alimta  and Keytruda  every 3 weeks.    He received radiation to  the iliac spine on 12/25/2023   Labs were reviewed. Recommend that he proceed with cycle #25 today as scheduled.    We will see him back for follow-up visit in 3 weeks for evaluation repeat blood work before undergoing cycle #26.   Will continue on his blood thinner for his history of DVT.   He will continue to follow with palliative care for his cancer related pain. He is scheduled to see them today.   I will arrange for restaging CT scan of the chest, abdomen, pelvis prior to his next cycle of treatment.    The patient was advised to call immediately if she has any concerning symptoms in the interval. The patient voices understanding of current disease status and treatment options and is in agreement with the current care plan. All questions were answered. The patient knows to call the clinic with any problems, questions or concerns. We can certainly see the patient much sooner if necessary     No orders of the defined types were placed in this encounter.    The total time spent in the appointment was 20-29 minutes  Janayla Marik L Jojo Geving, PA-C 03/27/24

## 2024-03-28 ENCOUNTER — Other Ambulatory Visit (HOSPITAL_COMMUNITY): Payer: Self-pay

## 2024-04-01 ENCOUNTER — Inpatient Hospital Stay: Attending: Internal Medicine

## 2024-04-01 ENCOUNTER — Other Ambulatory Visit (HOSPITAL_COMMUNITY): Payer: Self-pay

## 2024-04-01 ENCOUNTER — Inpatient Hospital Stay (HOSPITAL_BASED_OUTPATIENT_CLINIC_OR_DEPARTMENT_OTHER): Admitting: Nurse Practitioner

## 2024-04-01 ENCOUNTER — Inpatient Hospital Stay (HOSPITAL_BASED_OUTPATIENT_CLINIC_OR_DEPARTMENT_OTHER): Admitting: Physician Assistant

## 2024-04-01 ENCOUNTER — Inpatient Hospital Stay

## 2024-04-01 ENCOUNTER — Encounter: Payer: Self-pay | Admitting: Nurse Practitioner

## 2024-04-01 VITALS — BP 113/60 | HR 83 | Temp 98.3°F | Resp 17 | Wt 196.1 lb

## 2024-04-01 DIAGNOSIS — Z515 Encounter for palliative care: Secondary | ICD-10-CM | POA: Diagnosis not present

## 2024-04-01 DIAGNOSIS — F17210 Nicotine dependence, cigarettes, uncomplicated: Secondary | ICD-10-CM | POA: Insufficient documentation

## 2024-04-01 DIAGNOSIS — Z5112 Encounter for antineoplastic immunotherapy: Secondary | ICD-10-CM | POA: Insufficient documentation

## 2024-04-01 DIAGNOSIS — C7800 Secondary malignant neoplasm of unspecified lung: Secondary | ICD-10-CM | POA: Insufficient documentation

## 2024-04-01 DIAGNOSIS — Z923 Personal history of irradiation: Secondary | ICD-10-CM | POA: Diagnosis not present

## 2024-04-01 DIAGNOSIS — Z888 Allergy status to other drugs, medicaments and biological substances status: Secondary | ICD-10-CM | POA: Diagnosis not present

## 2024-04-01 DIAGNOSIS — Z880 Allergy status to penicillin: Secondary | ICD-10-CM | POA: Insufficient documentation

## 2024-04-01 DIAGNOSIS — Z86718 Personal history of other venous thrombosis and embolism: Secondary | ICD-10-CM | POA: Insufficient documentation

## 2024-04-01 DIAGNOSIS — Z886 Allergy status to analgesic agent status: Secondary | ICD-10-CM | POA: Insufficient documentation

## 2024-04-01 DIAGNOSIS — M545 Low back pain, unspecified: Secondary | ICD-10-CM | POA: Diagnosis not present

## 2024-04-01 DIAGNOSIS — R530 Neoplastic (malignant) related fatigue: Secondary | ICD-10-CM

## 2024-04-01 DIAGNOSIS — Z95828 Presence of other vascular implants and grafts: Secondary | ICD-10-CM

## 2024-04-01 DIAGNOSIS — C3490 Malignant neoplasm of unspecified part of unspecified bronchus or lung: Secondary | ICD-10-CM | POA: Diagnosis not present

## 2024-04-01 DIAGNOSIS — Z5111 Encounter for antineoplastic chemotherapy: Secondary | ICD-10-CM | POA: Insufficient documentation

## 2024-04-01 DIAGNOSIS — C3411 Malignant neoplasm of upper lobe, right bronchus or lung: Secondary | ICD-10-CM | POA: Insufficient documentation

## 2024-04-01 DIAGNOSIS — Z79899 Other long term (current) drug therapy: Secondary | ICD-10-CM | POA: Diagnosis not present

## 2024-04-01 DIAGNOSIS — G893 Neoplasm related pain (acute) (chronic): Secondary | ICD-10-CM | POA: Diagnosis not present

## 2024-04-01 DIAGNOSIS — E785 Hyperlipidemia, unspecified: Secondary | ICD-10-CM | POA: Diagnosis not present

## 2024-04-01 DIAGNOSIS — Z7901 Long term (current) use of anticoagulants: Secondary | ICD-10-CM | POA: Diagnosis not present

## 2024-04-01 DIAGNOSIS — F1290 Cannabis use, unspecified, uncomplicated: Secondary | ICD-10-CM | POA: Insufficient documentation

## 2024-04-01 LAB — CMP (CANCER CENTER ONLY)
ALT: 12 U/L (ref 0–44)
AST: 13 U/L — ABNORMAL LOW (ref 15–41)
Albumin: 3.7 g/dL (ref 3.5–5.0)
Alkaline Phosphatase: 69 U/L (ref 38–126)
Anion gap: 4 — ABNORMAL LOW (ref 5–15)
BUN: 18 mg/dL (ref 6–20)
CO2: 36 mmol/L — ABNORMAL HIGH (ref 22–32)
Calcium: 9 mg/dL (ref 8.9–10.3)
Chloride: 103 mmol/L (ref 98–111)
Creatinine: 0.92 mg/dL (ref 0.61–1.24)
GFR, Estimated: >60 mL/min (ref >60)
Glucose, Bld: 107 mg/dL — ABNORMAL HIGH (ref 70–99)
Potassium: 4.2 mmol/L (ref 3.5–5.1)
Sodium: 143 mmol/L (ref 135–145)
Total Bilirubin: 0.6 mg/dL (ref 0.0–1.2)
Total Protein: 6.5 g/dL (ref 6.5–8.1)

## 2024-04-01 LAB — CBC WITH DIFFERENTIAL (CANCER CENTER ONLY)
Abs Immature Granulocytes: 0.03 K/uL (ref 0.00–0.07)
Basophils Absolute: 0 K/uL (ref 0.0–0.1)
Basophils Relative: 0 %
Eosinophils Absolute: 0.3 K/uL (ref 0.0–0.5)
Eosinophils Relative: 4 %
HCT: 38.2 % — ABNORMAL LOW (ref 39.0–52.0)
Hemoglobin: 12 g/dL — ABNORMAL LOW (ref 13.0–17.0)
Immature Granulocytes: 0 %
Lymphocytes Relative: 5 %
Lymphs Abs: 0.4 K/uL — ABNORMAL LOW (ref 0.7–4.0)
MCH: 30.5 pg (ref 26.0–34.0)
MCHC: 31.4 g/dL (ref 30.0–36.0)
MCV: 97.2 fL (ref 80.0–100.0)
Monocytes Absolute: 0.7 K/uL (ref 0.1–1.0)
Monocytes Relative: 9 %
Neutro Abs: 6.2 K/uL (ref 1.7–7.7)
Neutrophils Relative %: 82 %
Platelet Count: 173 K/uL (ref 150–400)
RBC: 3.93 MIL/uL — ABNORMAL LOW (ref 4.22–5.81)
RDW: 15.4 % (ref 11.5–15.5)
WBC Count: 7.6 K/uL (ref 4.0–10.5)
nRBC: 0 % (ref 0.0–0.2)

## 2024-04-01 MED ORDER — SODIUM CHLORIDE 0.9% FLUSH
10.0000 mL | INTRAVENOUS | Status: DC | PRN
  Administered 2024-04-01: 10 mL

## 2024-04-01 MED ORDER — SODIUM CHLORIDE 0.9 % IV SOLN
500.0000 mg/m2 | Freq: Once | INTRAVENOUS | Status: AC
  Administered 2024-04-01: 1000 mg via INTRAVENOUS
  Filled 2024-04-01: qty 40

## 2024-04-01 MED ORDER — PROCHLORPERAZINE MALEATE 10 MG PO TABS
10.0000 mg | ORAL_TABLET | Freq: Once | ORAL | Status: AC
  Administered 2024-04-01: 10 mg via ORAL
  Filled 2024-04-01: qty 1

## 2024-04-01 MED ORDER — HEPARIN SOD (PORK) LOCK FLUSH 100 UNIT/ML IV SOLN
500.0000 [IU] | Freq: Once | INTRAVENOUS | Status: AC | PRN
  Administered 2024-04-01: 500 [IU]

## 2024-04-01 MED ORDER — SODIUM CHLORIDE 0.9% FLUSH
10.0000 mL | Freq: Once | INTRAVENOUS | Status: AC
Start: 2024-04-01 — End: 2024-04-01
  Administered 2024-04-01: 10 mL

## 2024-04-01 MED ORDER — SODIUM CHLORIDE 0.9 % IV SOLN
200.0000 mg | Freq: Once | INTRAVENOUS | Status: AC
  Administered 2024-04-01: 200 mg via INTRAVENOUS
  Filled 2024-04-01: qty 200

## 2024-04-01 MED ORDER — SODIUM CHLORIDE 0.9 % IV SOLN: Freq: Once | INTRAVENOUS | Status: AC

## 2024-04-01 NOTE — Patient Instructions (Signed)
 [facility] - A DEPT OF [facility]  Discharge Instructions: Thank you for choosing [facility] to provide your oncology and hematology care.   If you have a lab appointment with the Cancer Center, please go directly to the Cancer Center and check in at the registration area.   Wear comfortable clothing and clothing appropriate for easy access to any Portacath or PICC line.   We strive to give you quality time with your provider. You may need to reschedule your appointment if you arrive late (15 or more minutes).  Arriving late affects you and other patients whose appointments are after yours.  Also, if you miss three or more appointments without notifying the office, you may be dismissed from the clinic at the provider's discretion.      For prescription refill requests, have your pharmacy contact our office and allow 72 hours for refills to be completed.    Today you received the following chemotherapy and/or immunotherapy agents: Keytruda/Alimta      To help prevent nausea and vomiting after your treatment, we encourage you to take your nausea medication as directed.  BELOW ARE SYMPTOMS THAT SHOULD BE REPORTED IMMEDIATELY: *FEVER GREATER THAN 100.4 F (38 C) OR HIGHER *CHILLS OR SWEATING *NAUSEA AND VOMITING THAT IS NOT CONTROLLED WITH YOUR NAUSEA MEDICATION *UNUSUAL SHORTNESS OF BREATH *UNUSUAL BRUISING OR BLEEDING *URINARY PROBLEMS (pain or burning when urinating, or frequent urination) *BOWEL PROBLEMS (unusual diarrhea, constipation, pain near the anus) TENDERNESS IN MOUTH AND THROAT WITH OR WITHOUT PRESENCE OF ULCERS (sore throat, sores in mouth, or a toothache) UNUSUAL RASH, SWELLING OR PAIN  UNUSUAL VAGINAL DISCHARGE OR ITCHING   Items with * indicate a potential emergency and should be followed up as soon as possible or go to the Emergency Department if any problems should occur.  Please show the CHEMOTHERAPY ALERT CARD or  IMMUNOTHERAPY ALERT CARD at check-in to the Emergency Department and triage nurse.  Should you have questions after your visit or need to cancel or reschedule your appointment, please contact [facility] - A DEPT OF [facility]  Dept: [phone]  and follow the prompts.  Office hours are 8:00 a.m. to 4:30 p.m. Monday - Friday. Please note that voicemails left after 4:00 p.m. may not be returned until the following business day.  We are closed weekends and major holidays. You have access to a nurse at all times for urgent questions. Please call the main number to the clinic Dept: [phone] and follow the prompts.   For any non-urgent questions, you may also contact your provider using MyChart. We now offer e-Visits for anyone 6 and older to request care online for non-urgent symptoms. For details visit [email or web address].   Also download the MyChart app! Go to the app store, search "MyChart", open the app, select [place], and log in with your MyChart username and password.

## 2024-04-01 NOTE — Progress Notes (Signed)
 Palliative Medicine [facility]  [phone] Fax:(336) [phone]   Name: Maxwell Grant Date: 04/01/2024 MRN: [id number]  DOB: 04/30/1966  Patient Care Team: Maxwell Lenis, MD as PCP - General (Family Medicine) Maxwell Elenor BRAVO, RN as Registered Nurse    INTERVAL HISTORY: Maxwell Grant is a 58 y.o. male with oncologic medical history including new diagnosed non-small cell lung cancer (10/2022), as well as a history of COPD, diabetes mellitus, hypertension, dyslipidemia as well as history of osteomyelitis of the back.  Palliative ask to see for symptom and pain management and goals of care.   SOCIAL HISTORY:     reports that he has been smoking cigarettes. He has never used smokeless tobacco. He reports current alcohol use of about 1.0 standard drink of alcohol per week. He reports current drug use. Drug: Marijuana.  ADVANCE DIRECTIVES:  None on file   CODE STATUS: Full code  PAST MEDICAL HISTORY: Past Medical History:  Diagnosis Date   CHF (congestive heart failure) (HCC)    COPD (chronic obstructive pulmonary disease) (HCC)    Diabetes mellitus without complication (HCC)    Dyspnea    Hypertension    Lung cancer (HCC) 10/2022   Right leg DVT (HCC) 12/21/2022    ALLERGIES:  is allergic to statins, aspirin, flexeril [cyclobenzaprine], and penicillins.  MEDICATIONS:  Current Outpatient Medications  Medication Sig Dispense Refill   Accu-Chek FastClix Lancets MISC Use to test blood sugar 4 times a day 102 each 0   albuterol  (PROVENTIL ) (2.5 MG/3ML) 0.083% nebulizer solution Inhale 3 mL (2.5 mg total) by nebulization every 6 (six) hours as needed for wheezing. 360 mL 3   albuterol  (VENTOLIN  HFA) 108 (90 Base) MCG/ACT inhaler Inhale 1-2 puffs into the lungs every 6 (six) hours as needed for wheezing or shortness of breath. 18 g 1   albuterol  (VENTOLIN  HFA) 108 (90 Base) MCG/ACT inhaler Inhale 2 puffs every 6 (six) hours as needed for wheezing. 54 g  3   apixaban  (ELIQUIS ) 5 MG TABS tablet Take 1 tablet (5 mg total) by mouth 2 (two) times daily. 60 tablet 4   Azelastine  HCl 137 MCG/SPRAY SOLN Place 2 sprays into nostrils 2 (two) times daily. 30 mL 12   benzonatate  (TESSALON ) 100 MG capsule Take 1 capsule (100 mg total) by mouth 3 (three) times daily as needed. 30 capsule 2   fluticasone  (FLONASE ) 50 MCG/ACT nasal spray Place 1 spray into both nostrils daily. 16 g 2   Fluticasone -Umeclidin-Vilant (TRELEGY ELLIPTA ) 100-62.5-25 MCG/ACT AEPB Inhale 1 puff into the lungs daily in the afternoon. 60 each 5   folic acid  (FOLVITE ) 1 MG tablet Take 1 tablet (1 mg total) by mouth daily. 90 tablet 1   furosemide  (LASIX ) 20 MG tablet Take 1 tablet (20 mg) by mouth daily as needed. 7 tablet 0   gabapentin  (NEURONTIN ) 300 MG capsule Take 2 capsules (600mg ) in the morning, 1 capsule (300 mg ) midday, and 2 capsules at bedtime (600mg ). 150 capsule 2   glimepiride (AMARYL) 2 MG tablet Take 2 mg by mouth in the morning.     lidocaine -prilocaine  (EMLA ) cream Apply to [facility] Prior to Access as needed 30 g 2   meloxicam  (MOBIC ) 7.5 MG tablet Take 1 tablet (7.5 mg total) by mouth 2 (two) times daily. 60 tablet 3   ondansetron  (ZOFRAN ) 8 MG tablet Take 1 tablet (8 mg) by mouth every 8 hours as needed for nausea or vomiting. 30 tablet 2   OVER  THE COUNTER MEDICATION Take 1 tablet by mouth daily as needed (constipation).  Stool softener     oxyCODONE  (OXYCONTIN ) 20 mg 12 hr tablet Take 1 tablet (20 mg total) by mouth every 12 (twelve) hours. 60 tablet 0   oxyCODONE -acetaminophen  (PERCOCET) 7.5-325 MG tablet Take 1 tablet by mouth every 6 (six) hours as needed for severe pain (pain score 7-10). 60 tablet 0   prochlorperazine  (COMPAZINE ) 10 MG tablet Take 1 tablet (10 mg total) by mouth every 6 (six) hours as needed for nausea or vomiting. 30 tablet 0   TRELEGY ELLIPTA  100-62.5-25 MCG/ACT AEPB Inhale 1 puff once daily. 180 each 3   No current facility-administered  medications for this visit.   Facility-Administered Medications Ordered in Other Visits  Medication Dose Route Frequency Provider Last Rate Last Admin   sodium chloride  flush (NS) 0.9 % injection 10 mL  10 mL Intracatheter PRN Maxwell Sherrod, MD   10 mL at 04/01/24 1020    VITAL SIGNS: There were no vitals taken for this visit. There were no vitals filed for this visit.  Estimated body mass index is 31.65 kg/m as calculated from the following:   Height as of 03/11/24: 5' 6 (1.676 m).   Weight as of an earlier encounter on 04/01/24: 196 lb 1.6 oz (89 kg).  PERFORMANCE STATUS (ECOG) : 1 - Symptomatic but completely ambulatory  Physical Exam General: NAD Cardiovascular: regular rate and rhythm Pulmonary: normal breathing pattern Extremities: no edema, no joint deformities Skin: no rashes Neurological: AAO x3  IMPRESSION: Discussed the use of AI scribe software for clinical note transcription with the patient, who gave verbal consent to proceed.  History of Present Illness Maxwell Grant is a 58 year old male who presents for a routine follow-up. No acute distress. Accompanied by family. Reports he is doing well overall. Appetite continues to improve. Weight is up to 196lbs from 193lbs. Occasional fatigue however he is trying to remain active around the home. Denies concerns for nausea, vomiting, constipation, or diarrhea.   We discussed his current pain regimen at length. Maxwell Grant reports pain is mainly controlled however he is having some new lower back pain causing him to limit standing at time. He states he has to bend over some to allow for relief. No recent injuries. He is currently taking gabapentin , with a regimen of 300 mg in the morning and 600 mg at night, oxycontin  20mg  tablet every twelve hours, and has Percocet available as needed. Does not require daily use of breakthrough medication. Upcoming scans. No adjustments to regimen at this time.   All questions answered and  support provided.   Assessment & Plan Cancer Related Pain Management His current regimen is effective. - Ensure timely access to pain medication as per current regimen. -Continue OxyContin  20 mg every 12 hours -Continue oxycodone /acetaminophen  7.5/325 mg every 6 hours as needed.  For breakthrough pain.  Not requiring around-the-clock. -Continue gabapentin  300 mg twice daily and 600 mg at bedtime.   I will plan to see patient back in 6-8 weeks.  Sooner if needed.  Patient expressed understanding and was in agreement with this plan. He also understands that He can call the clinic at any time with any questions, concerns, or complaints.   Any controlled substances utilized were prescribed in the context of palliative care. PDMP has been reviewed.   Visit consisted of counseling and education dealing with the complex and emotionally intense issues of symptom management and palliative care in the setting  of serious and potentially life-threatening illness.  Levon Borer, AGPCNP-BC  Palliative Medicine Team/[facility]

## 2024-04-03 ENCOUNTER — Other Ambulatory Visit: Payer: Self-pay

## 2024-04-08 ENCOUNTER — Other Ambulatory Visit: Payer: Self-pay | Admitting: Physician Assistant

## 2024-04-08 ENCOUNTER — Other Ambulatory Visit: Payer: Self-pay

## 2024-04-08 ENCOUNTER — Other Ambulatory Visit: Payer: Self-pay | Admitting: Nurse Practitioner

## 2024-04-08 ENCOUNTER — Other Ambulatory Visit (HOSPITAL_COMMUNITY): Payer: Self-pay

## 2024-04-08 DIAGNOSIS — C3490 Malignant neoplasm of unspecified part of unspecified bronchus or lung: Secondary | ICD-10-CM

## 2024-04-08 DIAGNOSIS — G893 Neoplasm related pain (acute) (chronic): Secondary | ICD-10-CM

## 2024-04-08 DIAGNOSIS — Z515 Encounter for palliative care: Secondary | ICD-10-CM

## 2024-04-08 MED ORDER — OXYCODONE-ACETAMINOPHEN 7.5-325 MG PO TABS
1.0000 | ORAL_TABLET | Freq: Four times a day (QID) | ORAL | 0 refills | Status: DC | PRN
  Filled 2024-04-08: qty 60, 15d supply, fill #0

## 2024-04-08 MED ORDER — OXYCODONE HCL ER 20 MG PO T12A
20.0000 mg | EXTENDED_RELEASE_TABLET | Freq: Two times a day (BID) | ORAL | 0 refills | Status: DC
  Filled 2024-04-08: qty 60, 30d supply, fill #0

## 2024-04-11 ENCOUNTER — Encounter (HOSPITAL_COMMUNITY): Payer: Self-pay

## 2024-04-11 ENCOUNTER — Other Ambulatory Visit (HOSPITAL_COMMUNITY): Payer: Self-pay

## 2024-04-16 ENCOUNTER — Ambulatory Visit (HOSPITAL_COMMUNITY)
Admission: RE | Admit: 2024-04-16 | Discharge: 2024-04-16 | Disposition: A | Discharge status: Home / Self Care | Source: Ambulatory Visit | Attending: Physician Assistant | Admitting: Physician Assistant

## 2024-04-16 DIAGNOSIS — C3490 Malignant neoplasm of unspecified part of unspecified bronchus or lung: Secondary | ICD-10-CM | POA: Insufficient documentation

## 2024-04-16 MED ORDER — IOHEXOL 300 MG/ML  SOLN
100.0000 mL | Freq: Once | INTRAMUSCULAR | Status: AC | PRN
  Administered 2024-04-16: 100 mL via INTRAVENOUS

## 2024-04-17 ENCOUNTER — Other Ambulatory Visit: Payer: Self-pay

## 2024-04-17 ENCOUNTER — Other Ambulatory Visit (HOSPITAL_COMMUNITY): Payer: Self-pay

## 2024-04-22 ENCOUNTER — Inpatient Hospital Stay: Attending: Internal Medicine

## 2024-04-22 ENCOUNTER — Inpatient Hospital Stay

## 2024-04-22 ENCOUNTER — Inpatient Hospital Stay (HOSPITAL_BASED_OUTPATIENT_CLINIC_OR_DEPARTMENT_OTHER): Admitting: Internal Medicine

## 2024-04-22 VITALS — BP 122/72 | HR 70 | Temp 98.1°F | Resp 17 | Ht 66.0 in | Wt 196.0 lb

## 2024-04-22 DIAGNOSIS — Z5112 Encounter for antineoplastic immunotherapy: Secondary | ICD-10-CM | POA: Insufficient documentation

## 2024-04-22 DIAGNOSIS — I10 Essential (primary) hypertension: Secondary | ICD-10-CM | POA: Diagnosis not present

## 2024-04-22 DIAGNOSIS — C7951 Secondary malignant neoplasm of bone: Secondary | ICD-10-CM | POA: Insufficient documentation

## 2024-04-22 DIAGNOSIS — Z79899 Other long term (current) drug therapy: Secondary | ICD-10-CM | POA: Diagnosis not present

## 2024-04-22 DIAGNOSIS — J449 Chronic obstructive pulmonary disease, unspecified: Secondary | ICD-10-CM | POA: Insufficient documentation

## 2024-04-22 DIAGNOSIS — R0989 Other specified symptoms and signs involving the circulatory and respiratory systems: Secondary | ICD-10-CM | POA: Insufficient documentation

## 2024-04-22 DIAGNOSIS — C778 Secondary and unspecified malignant neoplasm of lymph nodes of multiple regions: Secondary | ICD-10-CM | POA: Diagnosis not present

## 2024-04-22 DIAGNOSIS — C7989 Secondary malignant neoplasm of other specified sites: Secondary | ICD-10-CM | POA: Diagnosis not present

## 2024-04-22 DIAGNOSIS — R5383 Other fatigue: Secondary | ICD-10-CM | POA: Diagnosis not present

## 2024-04-22 DIAGNOSIS — Z7901 Long term (current) use of anticoagulants: Secondary | ICD-10-CM | POA: Diagnosis not present

## 2024-04-22 DIAGNOSIS — C3490 Malignant neoplasm of unspecified part of unspecified bronchus or lung: Secondary | ICD-10-CM

## 2024-04-22 DIAGNOSIS — C3411 Malignant neoplasm of upper lobe, right bronchus or lung: Secondary | ICD-10-CM | POA: Insufficient documentation

## 2024-04-22 DIAGNOSIS — M549 Dorsalgia, unspecified: Secondary | ICD-10-CM | POA: Diagnosis not present

## 2024-04-22 DIAGNOSIS — Z888 Allergy status to other drugs, medicaments and biological substances status: Secondary | ICD-10-CM | POA: Diagnosis not present

## 2024-04-22 DIAGNOSIS — Z886 Allergy status to analgesic agent status: Secondary | ICD-10-CM | POA: Diagnosis not present

## 2024-04-22 DIAGNOSIS — Z5111 Encounter for antineoplastic chemotherapy: Secondary | ICD-10-CM | POA: Insufficient documentation

## 2024-04-22 DIAGNOSIS — Z95828 Presence of other vascular implants and grafts: Secondary | ICD-10-CM

## 2024-04-22 DIAGNOSIS — Z86718 Personal history of other venous thrombosis and embolism: Secondary | ICD-10-CM | POA: Insufficient documentation

## 2024-04-22 DIAGNOSIS — Z880 Allergy status to penicillin: Secondary | ICD-10-CM | POA: Diagnosis not present

## 2024-04-22 LAB — CBC WITH DIFFERENTIAL (CANCER CENTER ONLY)
Abs Immature Granulocytes: 0.02 K/uL (ref 0.00–0.07)
Basophils Absolute: 0 K/uL (ref 0.0–0.1)
Basophils Relative: 1 %
Eosinophils Absolute: 0.1 K/uL (ref 0.0–0.5)
Eosinophils Relative: 3 %
HCT: 38.7 % — ABNORMAL LOW (ref 39.0–52.0)
Hemoglobin: 12.1 g/dL — ABNORMAL LOW (ref 13.0–17.0)
Immature Granulocytes: 1 %
Lymphocytes Relative: 10 %
Lymphs Abs: 0.4 K/uL — ABNORMAL LOW (ref 0.7–4.0)
MCH: 29.7 pg (ref 26.0–34.0)
MCHC: 31.3 g/dL (ref 30.0–36.0)
MCV: 95.1 fL (ref 80.0–100.0)
Monocytes Absolute: 0.4 K/uL (ref 0.1–1.0)
Monocytes Relative: 11 %
Neutro Abs: 2.8 K/uL (ref 1.7–7.7)
Neutrophils Relative %: 74 %
Platelet Count: 197 K/uL (ref 150–400)
RBC: 4.07 MIL/uL — ABNORMAL LOW (ref 4.22–5.81)
RDW: 15.4 % (ref 11.5–15.5)
WBC Count: 3.8 K/uL — ABNORMAL LOW (ref 4.0–10.5)
nRBC: 0 % (ref 0.0–0.2)

## 2024-04-22 LAB — CMP (CANCER CENTER ONLY)
ALT: 7 U/L (ref 0–44)
AST: 12 U/L — ABNORMAL LOW (ref 15–41)
Albumin: 3.7 g/dL (ref 3.5–5.0)
Alkaline Phosphatase: 70 U/L (ref 38–126)
Anion gap: 3 — ABNORMAL LOW (ref 5–15)
BUN: 15 mg/dL (ref 6–20)
CO2: 36 mmol/L — ABNORMAL HIGH (ref 22–32)
Calcium: 8.8 mg/dL — ABNORMAL LOW (ref 8.9–10.3)
Chloride: 102 mmol/L (ref 98–111)
Creatinine: 1.1 mg/dL (ref 0.61–1.24)
GFR, Estimated: >60 mL/min (ref >60)
Glucose, Bld: 65 mg/dL — ABNORMAL LOW (ref 70–99)
Potassium: 4.3 mmol/L (ref 3.5–5.1)
Sodium: 141 mmol/L (ref 135–145)
Total Bilirubin: 0.6 mg/dL (ref 0.0–1.2)
Total Protein: 6.6 g/dL (ref 6.5–8.1)

## 2024-04-22 MED ORDER — SODIUM CHLORIDE 0.9 % IV SOLN
500.0000 mg/m2 | Freq: Once | INTRAVENOUS | Status: AC
  Administered 2024-04-22: 1000 mg via INTRAVENOUS
  Filled 2024-04-22: qty 40

## 2024-04-22 MED ORDER — SODIUM CHLORIDE 0.9 % IV SOLN: Freq: Once | INTRAVENOUS | Status: AC

## 2024-04-22 MED ORDER — ZOLEDRONIC ACID 4 MG/100ML IV SOLN
4.0000 mg | Freq: Once | INTRAVENOUS | Status: AC
  Administered 2024-04-22: 4 mg via INTRAVENOUS
  Filled 2024-04-22: qty 100

## 2024-04-22 MED ORDER — PROCHLORPERAZINE MALEATE 10 MG PO TABS
10.0000 mg | ORAL_TABLET | Freq: Once | ORAL | Status: AC
Start: 2024-04-22 — End: 2024-04-22
  Administered 2024-04-22: 10 mg via ORAL
  Filled 2024-04-22: qty 1

## 2024-04-22 MED ORDER — SODIUM CHLORIDE 0.9% FLUSH
10.0000 mL | Freq: Once | INTRAVENOUS | Status: AC
Start: 2024-04-22 — End: 2024-04-22
  Administered 2024-04-22: 10 mL

## 2024-04-22 MED ORDER — SODIUM CHLORIDE 0.9 % IV SOLN
200.0000 mg | Freq: Once | INTRAVENOUS | Status: AC
  Administered 2024-04-22: 200 mg via INTRAVENOUS
  Filled 2024-04-22: qty 200

## 2024-04-22 NOTE — Progress Notes (Signed)
 Ok to proceed with Zometa  today with Ca=8.8 per Dr. Sherrod.  Mihika Surrette, PharmD, MBA

## 2024-04-22 NOTE — Progress Notes (Signed)
 [facility] [phone]   [phone]  OFFICE PROGRESS NOTE  Pura Lenis, MD [address] [place] [address]  DIAGNOSIS:  1) Stage IV (T3, N2, M1 C) non-small cell lung cancer, adenocarcinoma presented with large right upper lobe lung mass in addition to right hilar and subcarinal lymphadenopathy in addition to multiple metastatic bone lesions involving the pelvis as well as the lower thoracic and lumbar spines in addition to metastatic disease to the pelvic muscles diagnosed in February 2024.  2) deep venous thrombosis of the right lower extremity diagnosed on December 18, 2022  Detected Alteration(s) / Biomarker(s) Associated FDA-approved therapies Clinical Trial Availability% cfDNA or Amplification  KRAS G12C approved by FDA Adagrasib, Sotorasib Yes 29.3%  CDK4 Amplification None Yes High (+++)Plasma Copy Number: 8.0  PD-L1 expression 98%  PRIOR THERAPY: None  CURRENT THERAPY:  1) Systemic chemotherapy with carboplatin  for AUC of 5, Alimta  500 Mg/M2 and Keytruda  200 Mg IV every 3 weeks.  First dose November 14, 2022.  Status post 25 cycles.  Starting from cycle #5 the patient is on maintenance treatment with Alimta  and Keytruda  every 3 weeks. 2) Eliquis  10 mg p.o. twice daily for 7 days followed by 5 mg p.o. twice daily.  First dose 12/26/2022.  INTERVAL HISTORY: Maxwell Grant 57 y.o. male returns to the clinic today for follow-up visit after by his sister. Discussed the use of AI scribe software for clinical note transcription with the patient, who gave verbal consent to proceed.  History of Present Illness Maxwell Grant is a 58 year old male with stage four non-small cell lung cancer who presents for follow-up of his condition. He is accompanied by his sister.  He was diagnosed with stage four non-small cell lung cancer, adenocarcinoma, in February 2024, with a positive KRAS G12C mutation and PD-L1 expression of  98%. He is currently undergoing palliative systemic chemoimmunotherapy and is on his 26th treatment session.  He experiences back pain when walking, which affects his ability to stand straight for long periods. There is also some discomfort in his legs.  Regarding respiratory symptoms, his breathing is described as 'so-so' with a mild cough and a small amount of phlegm. No hemoptysis or chest pain is reported.  No nausea, vomiting, diarrhea, or weight loss. His weight has remained stable.     MEDICAL HISTORY: Past Medical History:  Diagnosis Date   CHF (congestive heart failure) (HCC)    COPD (chronic obstructive pulmonary disease) (HCC)    Diabetes mellitus without complication (HCC)    Dyspnea    Hypertension    Lung cancer (HCC) 10/2022   Right leg DVT (HCC) 12/21/2022    ALLERGIES:  is allergic to statins, aspirin, flexeril [cyclobenzaprine], and penicillins.  MEDICATIONS:  Current Outpatient Medications  Medication Sig Dispense Refill   Accu-Chek FastClix Lancets MISC Use to test blood sugar 4 times a day 102 each 0   albuterol  (PROVENTIL ) (2.5 MG/3ML) 0.083% nebulizer solution Inhale 3 mL (2.5 mg total) by nebulization every 6 (six) hours as needed for wheezing. 360 mL 3   albuterol  (VENTOLIN  HFA) 108 (90 Base) MCG/ACT inhaler Inhale 1-2 puffs into the lungs every 6 (six) hours as needed for wheezing or shortness of breath. 18 g 1   albuterol  (VENTOLIN  HFA) 108 (90 Base) MCG/ACT inhaler Inhale 2 puffs every 6 (six) hours as needed for wheezing. 54 g 3   apixaban  (ELIQUIS ) 5 MG TABS tablet Take 1  tablet (5 mg total) by mouth 2 (two) times daily. 60 tablet 4   Azelastine  HCl 137 MCG/SPRAY SOLN Place 2 sprays into nostrils 2 (two) times daily. 30 mL 12   benzonatate  (TESSALON ) 100 MG capsule Take 1 capsule (100 mg total) by mouth 3 (three) times daily as needed. 30 capsule 2   fluticasone  (FLONASE ) 50 MCG/ACT nasal spray Place 1 spray into both nostrils daily. 16 g 2    Fluticasone -Umeclidin-Vilant (TRELEGY ELLIPTA ) 100-62.5-25 MCG/ACT AEPB Inhale 1 puff into the lungs daily in the afternoon. 60 each 5   folic acid  (FOLVITE ) 1 MG tablet Take 1 tablet (1 mg total) by mouth daily. 90 tablet 1   furosemide  (LASIX ) 20 MG tablet Take 1 tablet (20 mg) by mouth daily as needed. 7 tablet 0   gabapentin  (NEURONTIN ) 300 MG capsule Take 2 capsules (600mg ) in the morning, 1 capsule (300 mg ) midday, and 2 capsules at bedtime (600mg ). 150 capsule 2   glimepiride (AMARYL) 2 MG tablet Take 2 mg by mouth in the morning.     lidocaine -prilocaine  (EMLA ) cream Apply to [facility] Prior to Access as needed 30 g 2   meloxicam  (MOBIC ) 7.5 MG tablet Take 1 tablet (7.5 mg total) by mouth 2 (two) times daily. 60 tablet 3   ondansetron  (ZOFRAN ) 8 MG tablet Take 1 tablet (8 mg) by mouth every 8 hours as needed for nausea or vomiting. 30 tablet 2   OVER THE COUNTER MEDICATION Take 1 tablet by mouth daily as needed (constipation).  Stool softener     oxyCODONE  (OXYCONTIN ) 20 mg 12 hr tablet Take 1 tablet (20 mg total) by mouth every 12 (twelve) hours. 60 tablet 0   oxyCODONE -acetaminophen  (PERCOCET) 7.5-325 MG tablet Take 1 tablet by mouth every 6 (six) hours as needed for severe pain (pain score 7-10). 60 tablet 0   prochlorperazine  (COMPAZINE ) 10 MG tablet Take 1 tablet (10 mg total) by mouth every 6 (six) hours as needed for nausea or vomiting. 30 tablet 0   TRELEGY ELLIPTA  100-62.5-25 MCG/ACT AEPB Inhale 1 puff once daily. 180 each 3   No current facility-administered medications for this visit.    SURGICAL HISTORY:  Past Surgical History:  Procedure Laterality Date   BRONCHIAL NEEDLE ASPIRATION BIOPSY  10/30/2022   Procedure: BRONCHIAL NEEDLE ASPIRATION BIOPSIES;  Surgeon: Shelah Lamar RAMAN, MD;  Location: [facility] ENDOSCOPY;  Service: Cardiopulmonary;;   IR IMAGING GUIDED PORT INSERTION  11/17/2022   NO PAST SURGERIES     RADIOLOGY WITH ANESTHESIA  10/30/2022   Procedure: MRI WITH ANESTHESIA W/WO  CONTRAST;  Surgeon: Shelah Lamar RAMAN, MD;  Location: [facility] ENDOSCOPY;  Service: Cardiopulmonary;;   VIDEO BRONCHOSCOPY WITH ENDOBRONCHIAL ULTRASOUND Right 10/30/2022   Procedure: VIDEO BRONCHOSCOPY WITH ENDOBRONCHIAL ULTRASOUND;  Surgeon: Shelah Lamar RAMAN, MD;  Location: [facility] ENDOSCOPY;  Service: Cardiopulmonary;  Laterality: Right;    REVIEW OF SYSTEMS:  Constitutional: positive for fatigue Eyes: negative Ears, nose, mouth, throat, and face: negative Respiratory: positive for cough Cardiovascular: negative Gastrointestinal: negative Genitourinary:negative Integument/breast: negative Hematologic/lymphatic: negative Musculoskeletal:positive for back pain Neurological: negative Behavioral/Psych: negative Endocrine: negative Allergic/Immunologic: negative   PHYSICAL EXAMINATION: General appearance: alert, cooperative, fatigued, and no distress Head: Normocephalic, without obvious abnormality, atraumatic Neck: no adenopathy, no JVD, supple, symmetrical, trachea midline, and thyroid  not enlarged, symmetric, no tenderness/mass/nodules Lymph nodes: Cervical, supraclavicular, and axillary nodes normal. Resp: clear to auscultation bilaterally Back: symmetric, no curvature. ROM normal. No CVA tenderness. Cardio: regular rate and rhythm, S1, S2 normal, no murmur, click,  rub or gallop GI: soft, non-tender; bowel sounds normal; no masses,  no organomegaly Extremities: extremities normal, atraumatic, no cyanosis or edema Neurologic: Alert and oriented X 3, normal strength and tone. Normal symmetric reflexes. Normal coordination and gait  ECOG PERFORMANCE STATUS: 1 - Symptomatic but completely ambulatory  Blood pressure 122/72, pulse 70, temperature 98.1 F (36.7 C), temperature source Temporal, resp. rate 17, height 5' 6 (1.676 m), weight 196 lb (88.9 kg), SpO2 98%.  LABORATORY DATA: Lab Results  Component Value Date   WBC 3.8 (L) 04/22/2024   HGB 12.1 (L) 04/22/2024   HCT 38.7 (L) 04/22/2024    MCV 95.1 04/22/2024   PLT 197 04/22/2024      Chemistry      Component Value Date/Time   NA 141 04/22/2024 0820   K 4.3 04/22/2024 0820   CL 102 04/22/2024 0820   CO2 36 (H) 04/22/2024 0820   BUN 15 04/22/2024 0820   CREATININE 1.10 04/22/2024 0820      Component Value Date/Time   CALCIUM  8.8 (L) 04/22/2024 0820   ALKPHOS 70 04/22/2024 0820   AST 12 (L) 04/22/2024 0820   ALT 7 04/22/2024 0820   BILITOT 0.6 04/22/2024 0820       RADIOGRAPHIC STUDIES: No results found.    ASSESSMENT AND PLAN: This is a very pleasant 58 years old white male with stage IV (T3, N2, M1 C) non-small cell lung cancer, adenocarcinoma presented with large right upper lobe lung mass in addition to right hilar and subcarinal lymphadenopathy in addition to multiple metastatic bone lesions involving the pelvis as well as the lower thoracic and lumbar spines in addition to metastatic disease to the pelvic muscles diagnosed in February 2024.  Molecular studies showed positive KRAS G12C mutation and PD-L1 expression of 98%. The patient is here today to start the first cycle of systemic chemotherapy with carboplatin  for AUC of 5, Alimta  500 Mg/M2 and Keytruda  200 Mg IV every 3 weeks.  First dose November 14, 2022.  Status post 25 cycles.  Starting from cycle #5 the patient is on maintenance treatment with Alimta  and Keytruda  every 3 weeks.  He has been tolerating this treatment fairly well. The patient has been tolerating his treatment fairly well. He had repeat CT scan of the chest, abdomen and pelvis performed recently.  I personally independently reviewed the scan images and discussed the result with the patient and his sister.  The final report is still pending.  I did not see any clear evidence for disease progression in the chest but I will wait for the final report for confirmation. Assessment and Plan Assessment & Plan Stage IV non-small cell lung adenocarcinoma with KRAS G12C mutation and high PD-L1  expression Stage IV non-small cell lung adenocarcinoma, adenocarcinoma subtype, diagnosed in February 2024. Positive for KRAS G12C mutation and high PD-L1 expression (98%). Currently on palliative systemic chemoimmunotherapy. Recent imaging appears stable, awaiting official report. No significant weight loss. Breathing is adequate with occasional cough and minimal sputum production, no hemoptysis or chest pain. - Continue current chemoimmunotherapy regimen, today is treatment number 26. - Await official report of recent scan. - Contact radiology to expedite scan report reading.  Back pain with ambulation Back pain during ambulation, causing difficulty in maintaining an upright posture for extended periods.  Cough with sputum production Intermittent cough with minimal sputum production. No hemoptysis or significant respiratory distress. He was advised to call immediately if he has any other concerning symptoms in the interval.  The patient  voices understanding of current disease status and treatment options and is in agreement with the current care plan.  All questions were answered. The patient knows to call the clinic with any problems, questions or concerns. We can certainly see the patient much sooner if necessary.  The total time spent in the appointment was 30 minutes.  Disclaimer: This note was dictated with voice recognition software. Similar sounding words can inadvertently be transcribed and may not be corrected upon review.

## 2024-05-04 ENCOUNTER — Other Ambulatory Visit: Payer: Self-pay | Admitting: Nurse Practitioner

## 2024-05-04 DIAGNOSIS — C3490 Malignant neoplasm of unspecified part of unspecified bronchus or lung: Secondary | ICD-10-CM

## 2024-05-04 DIAGNOSIS — Z515 Encounter for palliative care: Secondary | ICD-10-CM

## 2024-05-04 DIAGNOSIS — G893 Neoplasm related pain (acute) (chronic): Secondary | ICD-10-CM

## 2024-05-05 ENCOUNTER — Other Ambulatory Visit (HOSPITAL_COMMUNITY): Payer: Self-pay

## 2024-05-05 MED ORDER — OXYCODONE-ACETAMINOPHEN 7.5-325 MG PO TABS
1.0000 | ORAL_TABLET | Freq: Four times a day (QID) | ORAL | 0 refills | Status: DC | PRN
Start: 2024-05-05 — End: 2024-05-20
  Filled 2024-05-08: qty 60, 15d supply, fill #0

## 2024-05-05 MED ORDER — OXYCODONE HCL ER 20 MG PO T12A
20.0000 mg | EXTENDED_RELEASE_TABLET | Freq: Two times a day (BID) | ORAL | 0 refills | Status: DC
  Filled 2024-05-08: qty 60, 30d supply, fill #0

## 2024-05-08 ENCOUNTER — Other Ambulatory Visit (HOSPITAL_COMMUNITY): Payer: Self-pay

## 2024-05-08 ENCOUNTER — Encounter (HOSPITAL_COMMUNITY): Payer: Self-pay

## 2024-05-10 ENCOUNTER — Other Ambulatory Visit (HOSPITAL_COMMUNITY): Payer: Self-pay

## 2024-05-13 ENCOUNTER — Encounter: Payer: Self-pay | Admitting: Nurse Practitioner

## 2024-05-13 ENCOUNTER — Other Ambulatory Visit (HOSPITAL_COMMUNITY): Payer: Self-pay

## 2024-05-13 ENCOUNTER — Other Ambulatory Visit: Payer: Self-pay

## 2024-05-13 ENCOUNTER — Inpatient Hospital Stay (HOSPITAL_BASED_OUTPATIENT_CLINIC_OR_DEPARTMENT_OTHER): Admitting: Nurse Practitioner

## 2024-05-13 ENCOUNTER — Inpatient Hospital Stay

## 2024-05-13 ENCOUNTER — Inpatient Hospital Stay (HOSPITAL_BASED_OUTPATIENT_CLINIC_OR_DEPARTMENT_OTHER): Admitting: Internal Medicine

## 2024-05-13 VITALS — BP 143/79 | HR 67 | Temp 98.2°F | Resp 17 | Wt 200.5 lb

## 2024-05-13 DIAGNOSIS — C3490 Malignant neoplasm of unspecified part of unspecified bronchus or lung: Secondary | ICD-10-CM

## 2024-05-13 DIAGNOSIS — M792 Neuralgia and neuritis, unspecified: Secondary | ICD-10-CM

## 2024-05-13 DIAGNOSIS — R530 Neoplastic (malignant) related fatigue: Secondary | ICD-10-CM | POA: Diagnosis not present

## 2024-05-13 DIAGNOSIS — Z5112 Encounter for antineoplastic immunotherapy: Secondary | ICD-10-CM | POA: Diagnosis not present

## 2024-05-13 DIAGNOSIS — Z95828 Presence of other vascular implants and grafts: Secondary | ICD-10-CM

## 2024-05-13 DIAGNOSIS — Z515 Encounter for palliative care: Secondary | ICD-10-CM | POA: Diagnosis not present

## 2024-05-13 DIAGNOSIS — G893 Neoplasm related pain (acute) (chronic): Secondary | ICD-10-CM

## 2024-05-13 LAB — CMP (CANCER CENTER ONLY)
ALT: 7 U/L (ref 0–44)
AST: 12 U/L — ABNORMAL LOW (ref 15–41)
Albumin: 3.7 g/dL (ref 3.5–5.0)
Alkaline Phosphatase: 67 U/L (ref 38–126)
Anion gap: 4 — ABNORMAL LOW (ref 5–15)
BUN: 14 mg/dL (ref 6–20)
CO2: 35 mmol/L — ABNORMAL HIGH (ref 22–32)
Calcium: 8.7 mg/dL — ABNORMAL LOW (ref 8.9–10.3)
Chloride: 102 mmol/L (ref 98–111)
Creatinine: 0.89 mg/dL (ref 0.61–1.24)
GFR, Estimated: >60 mL/min (ref >60)
Glucose, Bld: 126 mg/dL — ABNORMAL HIGH (ref 70–99)
Potassium: 4 mmol/L (ref 3.5–5.1)
Sodium: 141 mmol/L (ref 135–145)
Total Bilirubin: 0.4 mg/dL (ref 0.0–1.2)
Total Protein: 6.3 g/dL — ABNORMAL LOW (ref 6.5–8.1)

## 2024-05-13 LAB — CBC WITH DIFFERENTIAL (CANCER CENTER ONLY)
Abs Immature Granulocytes: 0.02 K/uL (ref 0.00–0.07)
Basophils Absolute: 0 K/uL (ref 0.0–0.1)
Basophils Relative: 1 %
Eosinophils Absolute: 0.2 K/uL (ref 0.0–0.5)
Eosinophils Relative: 5 %
HCT: 37.9 % — ABNORMAL LOW (ref 39.0–52.0)
Hemoglobin: 11.8 g/dL — ABNORMAL LOW (ref 13.0–17.0)
Immature Granulocytes: 0 %
Lymphocytes Relative: 10 %
Lymphs Abs: 0.5 K/uL — ABNORMAL LOW (ref 0.7–4.0)
MCH: 29.1 pg (ref 26.0–34.0)
MCHC: 31.1 g/dL (ref 30.0–36.0)
MCV: 93.6 fL (ref 80.0–100.0)
Monocytes Absolute: 0.4 K/uL (ref 0.1–1.0)
Monocytes Relative: 8 %
Neutro Abs: 3.4 K/uL (ref 1.7–7.7)
Neutrophils Relative %: 76 %
Platelet Count: 192 K/uL (ref 150–400)
RBC: 4.05 MIL/uL — ABNORMAL LOW (ref 4.22–5.81)
RDW: 15.6 % — ABNORMAL HIGH (ref 11.5–15.5)
WBC Count: 4.5 K/uL (ref 4.0–10.5)
nRBC: 0 % (ref 0.0–0.2)

## 2024-05-13 LAB — TSH: TSH: 2.07 u[IU]/mL (ref 0.350–4.500)

## 2024-05-13 MED ORDER — CYANOCOBALAMIN 1000 MCG/ML IJ SOLN
1000.0000 ug | Freq: Once | INTRAMUSCULAR | Status: AC
  Administered 2024-05-13: 1000 ug via INTRAMUSCULAR
  Filled 2024-05-13: qty 1

## 2024-05-13 MED ORDER — SODIUM CHLORIDE 0.9 % IV SOLN
500.0000 mg/m2 | Freq: Once | INTRAVENOUS | Status: AC
  Administered 2024-05-13: 1000 mg via INTRAVENOUS
  Filled 2024-05-13: qty 40

## 2024-05-13 MED ORDER — SODIUM CHLORIDE 0.9 % IV SOLN
200.0000 mg | Freq: Once | INTRAVENOUS | Status: AC
  Administered 2024-05-13: 200 mg via INTRAVENOUS
  Filled 2024-05-13: qty 200

## 2024-05-13 MED ORDER — SODIUM CHLORIDE 0.9 % IV SOLN: Freq: Once | INTRAVENOUS | Status: AC

## 2024-05-13 MED ORDER — PROCHLORPERAZINE MALEATE 10 MG PO TABS
10.0000 mg | ORAL_TABLET | Freq: Once | ORAL | Status: AC
  Administered 2024-05-13: 10 mg via ORAL
  Filled 2024-05-13: qty 1

## 2024-05-13 MED ORDER — SODIUM CHLORIDE 0.9% FLUSH
10.0000 mL | Freq: Once | INTRAVENOUS | Status: AC
  Administered 2024-05-13: 10 mL

## 2024-05-13 MED ORDER — GABAPENTIN 300 MG PO CAPS
ORAL_CAPSULE | ORAL | 2 refills | Status: AC
  Filled 2024-05-13: qty 150, 30d supply, fill #0
  Filled 2024-10-03: qty 150, 30d supply, fill #1

## 2024-05-13 NOTE — Patient Instructions (Signed)
 [facility] - A DEPT OF [facility]  Discharge Instructions: Thank you for choosing [facility] to provide your oncology and hematology care.   If you have a lab appointment with the Cancer Center, please go directly to the Cancer Center and check in at the registration area.   Wear comfortable clothing and clothing appropriate for easy access to any Portacath or PICC line.   We strive to give you quality time with your provider. You may need to reschedule your appointment if you arrive late (15 or more minutes).  Arriving late affects you and other patients whose appointments are after yours.  Also, if you miss three or more appointments without notifying the office, you may be dismissed from the clinic at the provider's discretion.      For prescription refill requests, have your pharmacy contact our office and allow 72 hours for refills to be completed.    Today you received the following chemotherapy and/or immunotherapy agents: Keytruda/Alimta      To help prevent nausea and vomiting after your treatment, we encourage you to take your nausea medication as directed.  BELOW ARE SYMPTOMS THAT SHOULD BE REPORTED IMMEDIATELY: *FEVER GREATER THAN 100.4 F (38 C) OR HIGHER *CHILLS OR SWEATING *NAUSEA AND VOMITING THAT IS NOT CONTROLLED WITH YOUR NAUSEA MEDICATION *UNUSUAL SHORTNESS OF BREATH *UNUSUAL BRUISING OR BLEEDING *URINARY PROBLEMS (pain or burning when urinating, or frequent urination) *BOWEL PROBLEMS (unusual diarrhea, constipation, pain near the anus) TENDERNESS IN MOUTH AND THROAT WITH OR WITHOUT PRESENCE OF ULCERS (sore throat, sores in mouth, or a toothache) UNUSUAL RASH, SWELLING OR PAIN  UNUSUAL VAGINAL DISCHARGE OR ITCHING   Items with * indicate a potential emergency and should be followed up as soon as possible or go to the Emergency Department if any problems should occur.  Please show the CHEMOTHERAPY ALERT CARD or  IMMUNOTHERAPY ALERT CARD at check-in to the Emergency Department and triage nurse.  Should you have questions after your visit or need to cancel or reschedule your appointment, please contact [facility] - A DEPT OF [facility]  Dept: [phone]  and follow the prompts.  Office hours are 8:00 a.m. to 4:30 p.m. Monday - Friday. Please note that voicemails left after 4:00 p.m. may not be returned until the following business day.  We are closed weekends and major holidays. You have access to a nurse at all times for urgent questions. Please call the main number to the clinic Dept: [phone] and follow the prompts.   For any non-urgent questions, you may also contact your provider using MyChart. We now offer e-Visits for anyone 6 and older to request care online for non-urgent symptoms. For details visit [email or web address].   Also download the MyChart app! Go to the app store, search "MyChart", open the app, select [place], and log in with your MyChart username and password.

## 2024-05-13 NOTE — Progress Notes (Signed)
 [facility] Telephone:(336) [phone]   Fax:(336) [phone]  OFFICE PROGRESS NOTE  Pura Lenis, MD [address] [place] [address]  DIAGNOSIS:  1) Stage IV (T3, N2, M1 C) non-small cell lung cancer, adenocarcinoma presented with large right upper lobe lung mass in addition to right hilar and subcarinal lymphadenopathy in addition to multiple metastatic bone lesions involving the pelvis as well as the lower thoracic and lumbar spines in addition to metastatic disease to the pelvic muscles diagnosed in February 2024.  2) deep venous thrombosis of the right lower extremity diagnosed on December 18, 2022  Detected Alteration(s) / Biomarker(s) Associated FDA-approved therapies Clinical Trial Availability% cfDNA or Amplification  KRAS G12C approved by FDA Adagrasib, Sotorasib Yes 29.3%  CDK4 Amplification None Yes High (+++)Plasma Copy Number: 8.0  PD-L1 expression 98%  PRIOR THERAPY: None  CURRENT THERAPY:  1) Systemic chemotherapy with carboplatin  for AUC of 5, Alimta  500 Mg/M2 and Keytruda  200 Mg IV every 3 weeks.  First dose November 14, 2022.  Status post 26 cycles.  Starting from cycle #5 the patient is on maintenance treatment with Alimta  and Keytruda  every 3 weeks. 2) Eliquis  10 mg p.o. twice daily for 7 days followed by 5 mg p.o. twice daily.  First dose 12/26/2022.  INTERVAL HISTORY: Maxwell Grant 58 y.o. male returns to the clinic today for follow-up visit after by his sister. Discussed the use of AI scribe software for clinical note transcription with the patient, who gave verbal consent to proceed.  History of Present Illness Maxwell Grant is a 58 year old male with stage four non-small cell lung cancer who presents for follow-up of his condition accompanied by his sister.  He was diagnosed with stage four non-small cell lung cancer, adenocarcinoma, in February 2024, characterized by a positive KRAS G12C mutation and a PD-L1  expression of 98%.  He is currently undergoing palliative systemic chemo-immunotherapy, initially with carboplatin , Alimta  and Keytruda  every 3 weeks for 4 cycles and starting from cycle #5 he is on maintenance treatment with Alimta  and Keytruda  every 3 weeks status post 26 cycles and is on cycle number 27 of his treatment regimen.  He experiences back and leg pain and has noticed hard areas on his neck and side, which have not changed in size recently. No nausea, vomiting, diarrhea, or headaches are present. He has experienced a recent weight gain.     MEDICAL HISTORY: Past Medical History:  Diagnosis Date   CHF (congestive heart failure) (HCC)    COPD (chronic obstructive pulmonary disease) (HCC)    Diabetes mellitus without complication (HCC)    Dyspnea    Hypertension    Lung cancer (HCC) 10/2022   Right leg DVT (HCC) 12/21/2022    ALLERGIES:  is allergic to statins, aspirin, flexeril [cyclobenzaprine], and penicillins.  MEDICATIONS:  Current Outpatient Medications  Medication Sig Dispense Refill   Accu-Chek FastClix Lancets MISC Use to test blood sugar 4 times a day 102 each 0   albuterol  (PROVENTIL ) (2.5 MG/3ML) 0.083% nebulizer solution Inhale 3 mL (2.5 mg total) by nebulization every 6 (six) hours as needed for wheezing. 360 mL 3   albuterol  (VENTOLIN  HFA) 108 (90 Base) MCG/ACT inhaler Inhale 1-2 puffs into the lungs every 6 (six) hours as needed for wheezing or shortness of breath. 18 g 1   albuterol  (VENTOLIN  HFA) 108 (90 Base) MCG/ACT inhaler Inhale 2 puffs every 6 (six) hours as needed for wheezing. 54 g  3   apixaban  (ELIQUIS ) 5 MG TABS tablet Take 1 tablet (5 mg total) by mouth 2 (two) times daily. 60 tablet 4   Azelastine  HCl 137 MCG/SPRAY SOLN Place 2 sprays into nostrils 2 (two) times daily. 30 mL 12   benzonatate  (TESSALON ) 100 MG capsule Take 1 capsule (100 mg total) by mouth 3 (three) times daily as needed. 30 capsule 2   fluticasone  (FLONASE ) 50 MCG/ACT nasal  spray Place 1 spray into both nostrils daily. 16 g 2   Fluticasone -Umeclidin-Vilant (TRELEGY ELLIPTA ) 100-62.5-25 MCG/ACT AEPB Inhale 1 puff into the lungs daily in the afternoon. 60 each 5   folic acid  (FOLVITE ) 1 MG tablet Take 1 tablet (1 mg total) by mouth daily. 90 tablet 1   furosemide  (LASIX ) 20 MG tablet Take 1 tablet (20 mg) by mouth daily as needed. 7 tablet 0   gabapentin  (NEURONTIN ) 300 MG capsule Take 2 capsules (600mg ) in the morning, 1 capsule (300 mg ) midday, and 2 capsules at bedtime (600mg ). 150 capsule 2   glimepiride (AMARYL) 2 MG tablet Take 2 mg by mouth in the morning.     lidocaine -prilocaine  (EMLA ) cream Apply to [facility] Prior to Access as needed 30 g 2   meloxicam  (MOBIC ) 7.5 MG tablet Take 1 tablet (7.5 mg total) by mouth 2 (two) times daily. 60 tablet 3   ondansetron  (ZOFRAN ) 8 MG tablet Take 1 tablet (8 mg) by mouth every 8 hours as needed for nausea or vomiting. 30 tablet 2   OVER THE COUNTER MEDICATION Take 1 tablet by mouth daily as needed (constipation).  Stool softener     oxyCODONE  (OXYCONTIN ) 20 mg 12 hr tablet Take 1 tablet (20 mg total) by mouth every 12 (twelve) hours. 60 tablet 0   oxyCODONE -acetaminophen  (PERCOCET) 7.5-325 MG tablet Take 1 tablet by mouth every 6 (six) hours as needed for severe pain (pain score 7-10). 60 tablet 0   prochlorperazine  (COMPAZINE ) 10 MG tablet Take 1 tablet (10 mg total) by mouth every 6 (six) hours as needed for nausea or vomiting. 30 tablet 0   TRELEGY ELLIPTA  100-62.5-25 MCG/ACT AEPB Inhale 1 puff once daily. 180 each 3   No current facility-administered medications for this visit.    SURGICAL HISTORY:  Past Surgical History:  Procedure Laterality Date   BRONCHIAL NEEDLE ASPIRATION BIOPSY  10/30/2022   Procedure: BRONCHIAL NEEDLE ASPIRATION BIOPSIES;  Surgeon: Shelah Lamar RAMAN, MD;  Location: [facility] ENDOSCOPY;  Service: Cardiopulmonary;;   IR IMAGING GUIDED PORT INSERTION  11/17/2022   NO PAST SURGERIES     RADIOLOGY WITH  ANESTHESIA  10/30/2022   Procedure: MRI WITH ANESTHESIA W/WO CONTRAST;  Surgeon: Shelah Lamar RAMAN, MD;  Location: [facility] ENDOSCOPY;  Service: Cardiopulmonary;;   VIDEO BRONCHOSCOPY WITH ENDOBRONCHIAL ULTRASOUND Right 10/30/2022   Procedure: VIDEO BRONCHOSCOPY WITH ENDOBRONCHIAL ULTRASOUND;  Surgeon: Shelah Lamar RAMAN, MD;  Location: [facility] ENDOSCOPY;  Service: Cardiopulmonary;  Laterality: Right;    REVIEW OF SYSTEMS:  A comprehensive review of systems was negative except for: Constitutional: positive for fatigue Musculoskeletal: positive for arthralgias and back pain   PHYSICAL EXAMINATION: General appearance: alert, cooperative, fatigued, and no distress Head: Normocephalic, without obvious abnormality, atraumatic Neck: no adenopathy, no JVD, supple, symmetrical, trachea midline, and thyroid  not enlarged, symmetric, no tenderness/mass/nodules Lymph nodes: Cervical, supraclavicular, and axillary nodes normal. Resp: clear to auscultation bilaterally Back: symmetric, no curvature. ROM normal. No CVA tenderness. Cardio: regular rate and rhythm, S1, S2 normal, no murmur, click, rub or gallop GI: soft, non-tender; bowel  sounds normal; no masses,  no organomegaly Extremities: extremities normal, atraumatic, no cyanosis or edema  ECOG PERFORMANCE STATUS: 1 - Symptomatic but completely ambulatory  Blood pressure (!) 143/79, pulse 67, temperature 98.2 F (36.8 C), resp. rate 17, weight 200 lb 8 oz (90.9 kg), SpO2 92%.  LABORATORY DATA: Lab Results  Component Value Date   WBC 4.5 05/13/2024   HGB 11.8 (L) 05/13/2024   HCT 37.9 (L) 05/13/2024   MCV 93.6 05/13/2024   PLT 192 05/13/2024      Chemistry      Component Value Date/Time   NA 141 04/22/2024 0820   K 4.3 04/22/2024 0820   CL 102 04/22/2024 0820   CO2 36 (H) 04/22/2024 0820   BUN 15 04/22/2024 0820   CREATININE 1.10 04/22/2024 0820      Component Value Date/Time   CALCIUM  8.8 (L) 04/22/2024 0820   ALKPHOS 70 04/22/2024 0820   AST 12  (L) 04/22/2024 0820   ALT 7 04/22/2024 0820   BILITOT 0.6 04/22/2024 0820       RADIOGRAPHIC STUDIES: CT CHEST ABDOMEN PELVIS W CONTRAST Result Date: 04/22/2024 CLINICAL DATA:  Non-small cell lung cancer, restaging. * Tracking Code: BO * EXAM: CT CHEST, ABDOMEN, AND PELVIS WITH CONTRAST TECHNIQUE: Multidetector CT imaging of the chest, abdomen and pelvis was performed following the standard protocol during bolus administration of intravenous contrast. RADIATION DOSE REDUCTION: This exam was performed according to the departmental dose-optimization program which includes automated exposure control, adjustment of the mA and/or kV according to patient size and/or use of iterative reconstruction technique. CONTRAST:  OMNIPAQUE  IOHEXOL  300 MG/ML  SOLN COMPARISON:  Multiple priors including CT Jan 23, 2024 FINDINGS: CT CHEST FINDINGS Cardiovascular: Accessed right chest Port-A-Cath with tip near the superior cavoatrial junction. Aortic atherosclerosis. Coronary artery calcifications. Normal size heart. No significant pericardial effusion/thickening. Mediastinum/Nodes: No suspicious thyroid  nodule. Stable prominent mediastinal and hilar lymph nodes. No pathologically enlarged mediastinal or hilar lymph nodes. Lungs/Pleura: Spiculated right upper lobe pulmonary nodule measures 2.9 x 2.5 cm on image 48/6 previously 2.9 x 2.4 cm. Right apical pleuroparenchymal scarring. Multiple irregular subpleural consolidation in the peripheral right upper lobe are again seen with a new irregular consolidative focus in continuity the pleural surface measuring 19 mm on image 32/6 Stable 4 mm subpleural right lower lobe pulmonary nodule on image 112/6. Scattered atelectasis versus scarring. Mild diffuse bronchial wall thickening. Musculoskeletal: Bilateral gynecomastia. Similar mixed lytic and sclerotic osseous metastatic disease with pathologic fractures of the manubrium and T8 vertebral body. No new aggressive lytic or blastic  lesion identified. Stable chronic healed bilateral rib fractures. Severe degenerative change of the left shoulder. CT ABDOMEN PELVIS FINDINGS Hepatobiliary: Stable hypodense segment IV hepatic lesion on image 65/2. No solid enhancing hepatic lesion. Gallbladder is unremarkable. No biliary ductal dilation. Pancreas: No pancreatic ductal dilation or evidence of acute inflammation. Spleen: No splenomegaly. Adrenals/Urinary Tract: Bilateral adrenal glands within normal limits. No hydronephrosis. Stable right renal cyst. Kidneys demonstrate symmetric enhancement. Urinary bladder is unremarkable for degree of distension. Stomach/Bowel: Stomach is unremarkable for degree of distension. No pathologic dilation of small or large bowel. No evidence of acute bowel inflammation. Vascular/Lymphatic: Aortic and branch vessel atherosclerosis. Smooth IVC contours. The portal, splenic and superior mesenteric veins are patent. No pathologically enlarged abdominal or pelvic lymph nodes. Reproductive: Prostate is unremarkable. Other: No significant abdominopelvic free fluid. Musculoskeletal: Similar mixed lytic and sclerotic osseous metastatic disease with pathologic fracture of the left ilium. No new suspicious osseous lesion. No  new pathologic fracture identified. IMPRESSION: 1. Stable spiculated right upper lobe pulmonary nodule. 2. Multiple irregular subpleural consolidation in the peripheral right upper lobe are again seen with a new irregular consolidative focus in continuity the pleural surface measuring 19 mm, nonspecific and possibly reflecting an infectious or inflammatory process. Suggest attention on follow-up imaging. 3. Similar mixed lytic and sclerotic osseous metastatic disease with pathologic fractures of the manubrium and T8 vertebral body. No new suspicious osseous lesion identified. 4. No evidence of new or progressive metastatic disease in the abdomen or pelvis. 5.  Aortic Atherosclerosis (ICD10-I70.0).  Electronically Signed   By: Reyes Holder M.D.   On: 04/22/2024 11:05      ASSESSMENT AND PLAN: This is a very pleasant 58 years old white male with stage IV (T3, N2, M1 C) non-small cell lung cancer, adenocarcinoma presented with large right upper lobe lung mass in addition to right hilar and subcarinal lymphadenopathy in addition to multiple metastatic bone lesions involving the pelvis as well as the lower thoracic and lumbar spines in addition to metastatic disease to the pelvic muscles diagnosed in February 2024.  Molecular studies showed positive KRAS G12C mutation and PD-L1 expression of 98%. The patient is here today to start the first cycle of systemic chemotherapy with carboplatin  for AUC of 5, Alimta  500 Mg/M2 and Keytruda  200 Mg IV every 3 weeks.  First dose November 14, 2022.  Status post 26 cycles.  Starting from cycle #5 the patient is on maintenance treatment with Alimta  and Keytruda  every 3 weeks.  He has been tolerating this treatment fairly well. The patient has been tolerating his treatment fairly well. His last CT scan of the chest, abdomen and pelvis showed no clear evidence for disease progression. Assessment and Plan Assessment & Plan Stage IV non-small cell lung adenocarcinoma with KRAS G12C mutation Stage IV non-small cell lung adenocarcinoma with KRAS G12C mutation, diagnosed in February 2024. Currently undergoing palliative systemic chemo-immunotherapy, cycle 27. Recent imaging shows stable disease. No new symptoms such as nausea, vomiting, diarrhea, or headaches. Slight weight gain noted.  Stable subcutaneous nodules under surveillance Subcutaneous nodules remain unchanged in size and are under surveillance. - Continue surveillance of subcutaneous nodules The patient was advised to call immediately if he has any concerning symptoms in the interval.  The patient voices understanding of current disease status and treatment options and is in agreement with the current  care plan.  All questions were answered. The patient knows to call the clinic with any problems, questions or concerns. We can certainly see the patient much sooner if necessary.  The total time spent in the appointment was 20 minutes.  Disclaimer: This note was dictated with voice recognition software. Similar sounding words can inadvertently be transcribed and may not be corrected upon review.

## 2024-05-14 LAB — T4: T4, Total: 7.4 ug/dL (ref 4.5–12.0)

## 2024-05-20 ENCOUNTER — Encounter: Payer: Self-pay | Admitting: Internal Medicine

## 2024-05-20 ENCOUNTER — Other Ambulatory Visit (HOSPITAL_COMMUNITY): Payer: Self-pay

## 2024-05-20 MED ORDER — OXYCODONE-ACETAMINOPHEN 7.5-325 MG PO TABS
1.0000 | ORAL_TABLET | Freq: Four times a day (QID) | ORAL | 0 refills | Status: DC | PRN
  Filled 2024-05-21: qty 60, 15d supply, fill #0

## 2024-05-20 NOTE — Progress Notes (Signed)
 Palliative Medicine [facility]  Telephone:(336) [phone] Fax:(336) [phone]   Name: Maxwell Grant Date: 05/20/2024 MRN: [id number]  DOB: [birth year]  Patient Care Team: Pura Lenis, MD as PCP - General (Family Medicine)    INTERVAL HISTORY: Maxwell Grant is a 58 y.o. male with oncologic medical history including new diagnosed non-small cell lung cancer (10/2022), as well as a history of COPD, diabetes mellitus, hypertension, dyslipidemia as well as history of osteomyelitis of the back.  Palliative ask to see for symptom and pain management and goals of care.   SOCIAL HISTORY:     reports that he has been smoking cigarettes. He has never used smokeless tobacco. He reports current alcohol use of about 1.0 standard drink of alcohol per week. He reports current drug use. Drug: Marijuana.  ADVANCE DIRECTIVES:  None on file   CODE STATUS: Full code  PAST MEDICAL HISTORY: Past Medical History:  Diagnosis Date   CHF (congestive heart failure) (HCC)    COPD (chronic obstructive pulmonary disease) (HCC)    Diabetes mellitus without complication (HCC)    Dyspnea    Hypertension    Lung cancer (HCC) 10/2022   Right leg DVT (HCC) 12/21/2022    ALLERGIES:  is allergic to statins, aspirin, flexeril [cyclobenzaprine], and penicillins.  MEDICATIONS:  Current Outpatient Medications  Medication Sig Dispense Refill   Accu-Chek FastClix Lancets MISC Use to test blood sugar 4 times a day 102 each 0   albuterol  (PROVENTIL ) (2.5 MG/3ML) 0.083% nebulizer solution Inhale 3 mL (2.5 mg total) by nebulization every 6 (six) hours as needed for wheezing. 360 mL 3   albuterol  (VENTOLIN  HFA) 108 (90 Base) MCG/ACT inhaler Inhale 1-2 puffs into the lungs every 6 (six) hours as needed for wheezing or shortness of breath. 18 g 1   albuterol  (VENTOLIN  HFA) 108 (90 Base) MCG/ACT inhaler Inhale 2 puffs every 6 (six) hours as needed for wheezing. 54 g 3   apixaban  (ELIQUIS ) 5 MG TABS tablet  Take 1 tablet (5 mg total) by mouth 2 (two) times daily. 60 tablet 4   Azelastine  HCl 137 MCG/SPRAY SOLN Place 2 sprays into nostrils 2 (two) times daily. 30 mL 12   benzonatate  (TESSALON ) 100 MG capsule Take 1 capsule (100 mg total) by mouth 3 (three) times daily as needed. 30 capsule 2   fluticasone  (FLONASE ) 50 MCG/ACT nasal spray Place 1 spray into both nostrils daily. 16 g 2   Fluticasone -Umeclidin-Vilant (TRELEGY ELLIPTA ) 100-62.5-25 MCG/ACT AEPB Inhale 1 puff into the lungs daily in the afternoon. 60 each 5   folic acid  (FOLVITE ) 1 MG tablet Take 1 tablet (1 mg total) by mouth daily. 90 tablet 1   furosemide  (LASIX ) 20 MG tablet Take 1 tablet (20 mg) by mouth daily as needed. 7 tablet 0   gabapentin  (NEURONTIN ) 300 MG capsule Take 2 capsules (600mg ) in the morning, 1 capsule (300 mg ) midday, and 2 capsules at bedtime (600mg ). 150 capsule 2   glimepiride (AMARYL) 2 MG tablet Take 2 mg by mouth in the morning.     lidocaine -prilocaine  (EMLA ) cream Apply to [facility] Prior to Access as needed 30 g 2   meloxicam  (MOBIC ) 7.5 MG tablet Take 1 tablet (7.5 mg total) by mouth 2 (two) times daily. 60 tablet 3   ondansetron  (ZOFRAN ) 8 MG tablet Take 1 tablet (8 mg) by mouth every 8 hours as needed for nausea or vomiting. 30 tablet 2   OVER THE COUNTER MEDICATION Take 1 tablet by  mouth daily as needed (constipation).  Stool softener     oxyCODONE  (OXYCONTIN ) 20 mg 12 hr tablet Take 1 tablet (20 mg total) by mouth every 12 (twelve) hours. 60 tablet 0   oxyCODONE -acetaminophen  (PERCOCET) 7.5-325 MG tablet Take 1 tablet by mouth every 6 (six) hours as needed for severe pain (pain score 7-10). 60 tablet 0   prochlorperazine  (COMPAZINE ) 10 MG tablet Take 1 tablet (10 mg total) by mouth every 6 (six) hours as needed for nausea or vomiting. 30 tablet 0   TRELEGY ELLIPTA  100-62.5-25 MCG/ACT AEPB Inhale 1 puff once daily. 180 each 3   No current facility-administered medications for this visit.    VITAL  SIGNS: There were no vitals taken for this visit. There were no vitals filed for this visit.  Estimated body mass index is 32.36 kg/m as calculated from the following:   Height as of 04/22/24: 5' 6 (1.676 m).   Weight as of an earlier encounter on 05/13/24: 200 lb 8 oz (90.9 kg).  PERFORMANCE STATUS (ECOG) : 1 - Symptomatic but completely ambulatory  Physical Exam General: NAD Cardiovascular: regular rate and rhythm Pulmonary: normal breathing pattern Extremities: no edema, no joint deformities Skin: no rashes Neurological: AAO x3  IMPRESSION: Discussed the use of AI scribe software for clinical note transcription with the patient, who gave verbal consent to proceed.  History of Present Illness Maxwell Grant is a 58 year old male who presents for a routine follow-up. Accompanied by family. Reports his pain has increased over the past several weeks despite regimen.   No gastrointestinal symptoms such as constipation, diarrhea, nausea, or vomiting. His appetite remains good, and he has gained four pounds recently, bringing his weight to 200 pounds from 196 pounds earlier this month.  He has been experiencing persistent leg pain that has worsened over time, returning to the severity experienced initially. The pain is described as constant and has become more intense recently. Pain affects activity levels at times causing him to remain immobile during the day.   He was taking OxyContin  every twelve hours and Percocet as needed for severe pain, sometimes up to twice a day, prior to this visit. He is concerned about running out of medication and occasionally goes a week without it. He also takes gabapentin , two capsules in the morning and two at night. I have recommended increasing his OxyContin  to every 8 hours for better pain relief. We will continue to closely monitor and support.   All questions answered and support provided.   Assessment & Plan Cancer Related Pain  Management Bilateral lower extremity pain, worsening in severity. Pain is persistent and has increased compared to previous levels. No associated constipation, diarrhea, nausea, or vomiting. Appetite is good with recent weight gain. Current pain management includes OxyContin  every 12 hours and Percocet as needed for severe pain. Gabapentin  is reportedly taken two in the morning and two at night. Concerns about running out of medication were addressed, with reassurance provided regarding timely refills. - Increase OxyContin  to every 8 hours. - Continue Percocet as needed for severe pain, every 4 to 6 hours. - Ensure timely communication for medication refills to avoid running out. - Refill gabapentin  prescription.  Follow-Up Scheduled follow-up for infusion therapy. - Will see patient back in collaboration with scheduled appointment on September 16th.  Patient expressed understanding and was in agreement with this plan. He also understands that He can call the clinic at any time with any questions, concerns, or complaints.  Any controlled substances utilized were prescribed in the context of palliative care. PDMP has been reviewed.   Visit consisted of counseling and education dealing with the complex and emotionally intense issues of symptom management and palliative care in the setting of serious and potentially life-threatening illness.  Levon Borer, AGPCNP-BC  Palliative Medicine Team/[facility]

## 2024-05-21 ENCOUNTER — Other Ambulatory Visit (HOSPITAL_COMMUNITY): Payer: Self-pay

## 2024-05-29 NOTE — Progress Notes (Signed)
 [facility] OFFICE PROGRESS NOTE  Pura Lenis, MD [address] [place] [address]  DIAGNOSIS:  1) Stage IV (T3, N2, M1 C) non-small cell lung cancer, adenocarcinoma presented with large right upper lobe lung mass in addition to right hilar and subcarinal lymphadenopathy in addition to multiple metastatic bone lesions involving the pelvis as well as the lower thoracic and lumbar spines in addition to metastatic disease to the pelvic muscles diagnosed in February 2024.  2) deep venous thrombosis of the right lower extremity diagnosed on December 18, 2022   Detected Alteration(s) / Biomarker(s)         Associated FDA-approved therapies  Clinical Trial Availability% cfDNA or Amplification   KRAS G12C approved by FDA Adagrasib, Sotorasib Yes    29.3%   CDK4 Amplification None Yes            High (+++)Plasma Copy Number: 8.0   PD-L1 expression 98%  PRIOR THERAPY: Palliative radiotherapy to the painful bone metastasis under the care of Dr. Dewey    Radiation to the iliac spine on 12/25/2023  CURRENT THERAPY: 1) Systemic chemotherapy with carboplatin  for AUC of 5, Alimta  500 Mg/M2 and Keytruda  200 Mg IV every 3 weeks.  First dose November 14, 2022.  Status post 27 cycles.  Working from cycle #5 the patient has been on maintenance treatment with Alimta  and Keytruda  every 3 weeks. 2) Eliquis  10 mg p.o. twice daily for 7 days followed by 5 mg p.o. twice daily.  First dose 12/26/2022  INTERVAL HISTORY: Maxwell Grant 58 y.o. male returns to the clinic today for a follow-up visit accompanied by his sister. The patient was last seen by Dr. Sherrod 3 weeks ago.    He is currently on OxyContin  20 mg every 12 hours, oxycodone  7.5 mg 2x per day, MiraLAX, and Colace. He is currently on Eliquis  for history of DVT in the past. He sees palliative care. He is scheduled to see them today. He also completed palliative radiation to the back.    He reports his fatigue is stable. He  denies any fever. He reports some stable dyspnea on exertion which is unchanged. Denies any chest pain or hemoptysis.  He has an intermittent chronic cough which he also states is the same. He continues to smoke cigarettes. He averages 1 ppd. He denies any significant nausea with treatment. Denies any vomiting, diarrhea, or concerning constipation. Denies any headache or visual changes. Denies any rashes or skin changes. He is compliant with his blood thinner for his history of DVT. He is here today for evaluation and repeat blood work before undergoing cycle #28.  MEDICAL HISTORY: Past Medical History:  Diagnosis Date   CHF (congestive heart failure) (HCC)    COPD (chronic obstructive pulmonary disease) (HCC)    Diabetes mellitus without complication (HCC)    Dyspnea    Hypertension    Lung cancer (HCC) 10/2022   Right leg DVT (HCC) 12/21/2022    ALLERGIES:  is allergic to statins, aspirin, flexeril [cyclobenzaprine], and penicillins.  MEDICATIONS:  Current Outpatient Medications  Medication Sig Dispense Refill   Accu-Chek FastClix Lancets MISC Use to test blood sugar 4 times a day 102 each 0   albuterol  (PROVENTIL ) (2.5 MG/3ML) 0.083% nebulizer solution Inhale 3 mL (2.5 mg total) by nebulization every 6 (six) hours as needed for wheezing. 360 mL 3   albuterol  (VENTOLIN  HFA) 108 (90 Base) MCG/ACT inhaler Inhale 1-2 puffs into the lungs every 6 (six) hours as  needed for wheezing or shortness of breath. 18 g 1   albuterol  (VENTOLIN  HFA) 108 (90 Base) MCG/ACT inhaler Inhale 2 puffs every 6 (six) hours as needed for wheezing. 54 g 3   apixaban  (ELIQUIS ) 5 MG TABS tablet Take 1 tablet (5 mg total) by mouth 2 (two) times daily. 60 tablet 4   Azelastine  HCl 137 MCG/SPRAY SOLN Place 2 sprays into nostrils 2 (two) times daily. 30 mL 12   benzonatate  (TESSALON ) 100 MG capsule Take 1 capsule (100 mg total) by mouth 3 (three) times daily as needed. 30 capsule 2   fluticasone  (FLONASE ) 50 MCG/ACT nasal  spray Place 1 spray into both nostrils daily. 16 g 2   Fluticasone -Umeclidin-Vilant (TRELEGY ELLIPTA ) 100-62.5-25 MCG/ACT AEPB Inhale 1 puff into the lungs daily in the afternoon. 60 each 5   folic acid  (FOLVITE ) 1 MG tablet Take 1 tablet (1 mg total) by mouth daily. 90 tablet 1   furosemide  (LASIX ) 20 MG tablet Take 1 tablet (20 mg) by mouth daily as needed. 7 tablet 0   gabapentin  (NEURONTIN ) 300 MG capsule Take 2 capsules (600mg ) in the morning, 1 capsule (300 mg ) midday, and 2 capsules at bedtime (600mg ). 150 capsule 2   glimepiride (AMARYL) 2 MG tablet Take 2 mg by mouth in the morning.     lidocaine -prilocaine  (EMLA ) cream Apply to [facility] Prior to Access as needed 30 g 2   meloxicam  (MOBIC ) 7.5 MG tablet Take 1 tablet (7.5 mg total) by mouth 2 (two) times daily. 60 tablet 3   ondansetron  (ZOFRAN ) 8 MG tablet Take 1 tablet (8 mg) by mouth every 8 hours as needed for nausea or vomiting. 30 tablet 2   OVER THE COUNTER MEDICATION Take 1 tablet by mouth daily as needed (constipation).  Stool softener     oxyCODONE  (OXYCONTIN ) 20 mg 12 hr tablet Take 1 tablet (20 mg total) by mouth every 8 (eight) hours. 90 tablet 0   oxyCODONE -acetaminophen  (PERCOCET) 7.5-325 MG tablet Take 1 tablet by mouth every 6 (six) hours as needed for severe pain (pain score 7-10). 60 tablet 0   prochlorperazine  (COMPAZINE ) 10 MG tablet Take 1 tablet (10 mg total) by mouth every 6 (six) hours as needed for nausea or vomiting. 30 tablet 0   TRELEGY ELLIPTA  100-62.5-25 MCG/ACT AEPB Inhale 1 puff once daily. 180 each 3   No current facility-administered medications for this visit.    SURGICAL HISTORY:  Past Surgical History:  Procedure Laterality Date   BRONCHIAL NEEDLE ASPIRATION BIOPSY  10/30/2022   Procedure: BRONCHIAL NEEDLE ASPIRATION BIOPSIES;  Surgeon: Shelah Lamar RAMAN, MD;  Location: [facility] ENDOSCOPY;  Service: Cardiopulmonary;;   IR IMAGING GUIDED PORT INSERTION  11/17/2022   NO PAST SURGERIES     RADIOLOGY WITH  ANESTHESIA  10/30/2022   Procedure: MRI WITH ANESTHESIA W/WO CONTRAST;  Surgeon: Shelah Lamar RAMAN, MD;  Location: [facility] ENDOSCOPY;  Service: Cardiopulmonary;;   VIDEO BRONCHOSCOPY WITH ENDOBRONCHIAL ULTRASOUND Right 10/30/2022   Procedure: VIDEO BRONCHOSCOPY WITH ENDOBRONCHIAL ULTRASOUND;  Surgeon: Shelah Lamar RAMAN, MD;  Location: [facility] ENDOSCOPY;  Service: Cardiopulmonary;  Laterality: Right;    REVIEW OF SYSTEMS:   Review of Systems  Constitutional: Positive for stable fatigue. Negative for appetite change, chills, fever and unexpected weight change.  HENT: Negative for mouth sores, nosebleeds, sore throat and trouble swallowing.   Eyes: Negative for eye problems and icterus.  Respiratory: Positive for baseline dyspnea on exertion and stable intermittent cough.  Negative for hemoptysis and wheezing.  Cardiovascular: Negative for chest pain and leg swelling. Gastrointestinal: Negative for abdominal pain, constipation, diarrhea, nausea and vomiting.  Genitourinary: Negative for bladder incontinence, difficulty urinating, dysuria, frequency and hematuria.   Musculoskeletal: Positive for cancer related bone pain. Negative for gait problem, neck pain and neck stiffness.  Skin: Negative for itching and rash.  Neurological: Negative for dizziness, extremity weakness, gait problem, headaches, light-headedness and seizures.  Hematological: Negative for adenopathy. Does not bruise/bleed easily.  Psychiatric/Behavioral: Negative for confusion, depression and sleep disturbance. The patient is not nervous/anxious.  PHYSICAL EXAMINATION:  Blood pressure (!) 147/75, pulse 61, temperature 98.3 F (36.8 C), temperature source Temporal, resp. rate 17, weight 202 lb 14.4 oz (92 kg), SpO2 94%.  ECOG PERFORMANCE STATUS: 1  Physical Exam  Constitutional: Oriented to person, place, and time and well-developed, well-nourished, and in no distress.  HENT:  Head: Normocephalic and atraumatic.  Mouth/Throat:  Oropharynx is clear and moist. No oropharyngeal exudate.  Eyes: Conjunctivae are normal. Right eye exhibits no discharge. Left eye exhibits no discharge. No scleral icterus.  Neck: Normal range of motion. Neck supple.  Cardiovascular: Normal rate, regular rhythm, normal heart sounds and intact distal pulses.   Pulmonary/Chest: Effort normal. Quiet breath sounds bilaterally. No respiratory distress. No wheezes. No rales.  Abdominal: Soft. Bowel sounds are normal. Exhibits no distension and no mass. There is no tenderness.  Musculoskeletal: Normal range of motion. Wearing compression stockings Lymphadenopathy:    No cervical adenopathy.  Neurological: Alert and oriented to person, place, and time. Exhibits muscle wasting. Examined in the wheelchair.  Skin: Skin is warm and dry. No rash noted. Not diaphoretic. No erythema. No pallor.  Psychiatric: Mood, memory and judgment normal.  Vitals reviewed.  LABORATORY DATA: Lab Results  Component Value Date   WBC 4.3 06/03/2024   HGB 12.1 (L) 06/03/2024   HCT 39.3 06/03/2024   MCV 94.0 06/03/2024   PLT 171 06/03/2024      Chemistry      Component Value Date/Time   NA 141 05/13/2024 0821   K 4.0 05/13/2024 0821   CL 102 05/13/2024 0821   CO2 35 (H) 05/13/2024 0821   BUN 14 05/13/2024 0821   CREATININE 0.89 05/13/2024 0821      Component Value Date/Time   CALCIUM  8.7 (L) 05/13/2024 0821   ALKPHOS 67 05/13/2024 0821   AST 12 (L) 05/13/2024 0821   ALT 7 05/13/2024 0821   BILITOT 0.4 05/13/2024 0821       RADIOGRAPHIC STUDIES:  No results found.   ASSESSMENT/PLAN:  This is a very pleasant 58 years old Caucasian male with stage IV (T3, N2, M1 C) non-small cell lung cancer, adenocarcinoma presented with large right upper lobe lung mass in addition to right hilar and subcarinal lymphadenopathy in addition to multiple metastatic bone lesions involving the pelvis as well as the lower thoracic and lumbar spines in addition to metastatic  disease to the pelvic muscles diagnosed in February 2024.  Molecular studies showed positive KRAS G12C mutation and PD-L1 expression of 98%.   The patient is here today to start the first cycle of systemic chemotherapy with carboplatin  for AUC of 5, Alimta  500 Mg/M2 and Keytruda  200 Mg IV every 3 weeks.  First dose November 14, 2022.  Status post 27 cycles.  Starting from cycle #5 the patient is on maintenance treatment with Alimta  and Keytruda  every 3 weeks.    He received radiation to the iliac spine on 12/25/2023   Labs were reviewed. Recommend that he  proceed with cycle #28 today as scheduled.    We will see him back for follow-up visit in 3 weeks for evaluation repeat blood work before undergoing cycle #29.   Will continue on his blood thinner for his history of DVT.   He will continue to follow with palliative care for his cancer related pain. He is scheduled to see them today.   The patient was advised to call immediately if he has any concerning symptoms in the interval. The patient voices understanding of current disease status and treatment options and is in agreement with the current care plan. All questions were answered. The patient knows to call the clinic with any problems, questions or concerns. We can certainly see the patient much sooner if necessary    No orders of the defined types were placed in this encounter.   The total time spent in the appointment was 20-29 minutes  Donnita Farina L Anara Cowman, PA-C 06/03/24

## 2024-06-01 ENCOUNTER — Other Ambulatory Visit: Payer: Self-pay | Admitting: Nurse Practitioner

## 2024-06-01 ENCOUNTER — Other Ambulatory Visit (HOSPITAL_COMMUNITY): Payer: Self-pay

## 2024-06-01 DIAGNOSIS — G893 Neoplasm related pain (acute) (chronic): Secondary | ICD-10-CM

## 2024-06-01 DIAGNOSIS — C3490 Malignant neoplasm of unspecified part of unspecified bronchus or lung: Secondary | ICD-10-CM

## 2024-06-01 DIAGNOSIS — Z515 Encounter for palliative care: Secondary | ICD-10-CM

## 2024-06-01 MED ORDER — OXYCODONE HCL ER 20 MG PO T12A
20.0000 mg | EXTENDED_RELEASE_TABLET | Freq: Three times a day (TID) | ORAL | 0 refills | Status: DC
  Filled 2024-06-01 – 2024-06-02 (×2): qty 90, 30d supply, fill #0

## 2024-06-02 ENCOUNTER — Other Ambulatory Visit (HOSPITAL_COMMUNITY): Payer: Self-pay

## 2024-06-02 ENCOUNTER — Encounter (HOSPITAL_COMMUNITY): Payer: Self-pay

## 2024-06-03 ENCOUNTER — Encounter: Payer: Self-pay | Admitting: Nurse Practitioner

## 2024-06-03 ENCOUNTER — Inpatient Hospital Stay: Attending: Internal Medicine

## 2024-06-03 ENCOUNTER — Inpatient Hospital Stay

## 2024-06-03 ENCOUNTER — Inpatient Hospital Stay (HOSPITAL_BASED_OUTPATIENT_CLINIC_OR_DEPARTMENT_OTHER): Admitting: Physician Assistant

## 2024-06-03 ENCOUNTER — Inpatient Hospital Stay: Admitting: Nurse Practitioner

## 2024-06-03 VITALS — BP 147/75 | HR 61 | Temp 98.3°F | Resp 17 | Wt 202.9 lb

## 2024-06-03 DIAGNOSIS — R5383 Other fatigue: Secondary | ICD-10-CM | POA: Diagnosis not present

## 2024-06-03 DIAGNOSIS — Z5111 Encounter for antineoplastic chemotherapy: Secondary | ICD-10-CM | POA: Insufficient documentation

## 2024-06-03 DIAGNOSIS — C3411 Malignant neoplasm of upper lobe, right bronchus or lung: Secondary | ICD-10-CM | POA: Diagnosis present

## 2024-06-03 DIAGNOSIS — Z886 Allergy status to analgesic agent status: Secondary | ICD-10-CM | POA: Diagnosis not present

## 2024-06-03 DIAGNOSIS — I509 Heart failure, unspecified: Secondary | ICD-10-CM | POA: Insufficient documentation

## 2024-06-03 DIAGNOSIS — C3490 Malignant neoplasm of unspecified part of unspecified bronchus or lung: Secondary | ICD-10-CM

## 2024-06-03 DIAGNOSIS — G893 Neoplasm related pain (acute) (chronic): Secondary | ICD-10-CM

## 2024-06-03 DIAGNOSIS — F17210 Nicotine dependence, cigarettes, uncomplicated: Secondary | ICD-10-CM | POA: Diagnosis not present

## 2024-06-03 DIAGNOSIS — Z515 Encounter for palliative care: Secondary | ICD-10-CM

## 2024-06-03 DIAGNOSIS — Z86718 Personal history of other venous thrombosis and embolism: Secondary | ICD-10-CM | POA: Insufficient documentation

## 2024-06-03 DIAGNOSIS — F1290 Cannabis use, unspecified, uncomplicated: Secondary | ICD-10-CM | POA: Insufficient documentation

## 2024-06-03 DIAGNOSIS — Z923 Personal history of irradiation: Secondary | ICD-10-CM | POA: Diagnosis not present

## 2024-06-03 DIAGNOSIS — J449 Chronic obstructive pulmonary disease, unspecified: Secondary | ICD-10-CM | POA: Diagnosis not present

## 2024-06-03 DIAGNOSIS — Z880 Allergy status to penicillin: Secondary | ICD-10-CM | POA: Insufficient documentation

## 2024-06-03 DIAGNOSIS — E1169 Type 2 diabetes mellitus with other specified complication: Secondary | ICD-10-CM | POA: Insufficient documentation

## 2024-06-03 DIAGNOSIS — Z7901 Long term (current) use of anticoagulants: Secondary | ICD-10-CM | POA: Diagnosis not present

## 2024-06-03 DIAGNOSIS — Z5112 Encounter for antineoplastic immunotherapy: Secondary | ICD-10-CM | POA: Diagnosis not present

## 2024-06-03 DIAGNOSIS — Z888 Allergy status to other drugs, medicaments and biological substances status: Secondary | ICD-10-CM | POA: Diagnosis not present

## 2024-06-03 DIAGNOSIS — Z79899 Other long term (current) drug therapy: Secondary | ICD-10-CM | POA: Diagnosis not present

## 2024-06-03 DIAGNOSIS — I110 Hypertensive heart disease with heart failure: Secondary | ICD-10-CM | POA: Insufficient documentation

## 2024-06-03 DIAGNOSIS — E785 Hyperlipidemia, unspecified: Secondary | ICD-10-CM | POA: Diagnosis not present

## 2024-06-03 DIAGNOSIS — C7951 Secondary malignant neoplasm of bone: Secondary | ICD-10-CM | POA: Diagnosis not present

## 2024-06-03 LAB — CBC WITH DIFFERENTIAL (CANCER CENTER ONLY)
Abs Immature Granulocytes: 0.02 K/uL (ref 0.00–0.07)
Basophils Absolute: 0 K/uL (ref 0.0–0.1)
Basophils Relative: 1 %
Eosinophils Absolute: 0.3 K/uL (ref 0.0–0.5)
Eosinophils Relative: 6 %
HCT: 39.3 % (ref 39.0–52.0)
Hemoglobin: 12.1 g/dL — ABNORMAL LOW (ref 13.0–17.0)
Immature Granulocytes: 1 %
Lymphocytes Relative: 13 %
Lymphs Abs: 0.6 K/uL — ABNORMAL LOW (ref 0.7–4.0)
MCH: 28.9 pg (ref 26.0–34.0)
MCHC: 30.8 g/dL (ref 30.0–36.0)
MCV: 94 fL (ref 80.0–100.0)
Monocytes Absolute: 0.3 K/uL (ref 0.1–1.0)
Monocytes Relative: 8 %
Neutro Abs: 3.1 K/uL (ref 1.7–7.7)
Neutrophils Relative %: 71 %
Platelet Count: 171 K/uL (ref 150–400)
RBC: 4.18 MIL/uL — ABNORMAL LOW (ref 4.22–5.81)
RDW: 15.8 % — ABNORMAL HIGH (ref 11.5–15.5)
WBC Count: 4.3 K/uL (ref 4.0–10.5)
nRBC: 0 % (ref 0.0–0.2)

## 2024-06-03 LAB — CMP (CANCER CENTER ONLY)
ALT: 11 U/L (ref 0–44)
AST: 14 U/L — ABNORMAL LOW (ref 15–41)
Albumin: 3.6 g/dL (ref 3.5–5.0)
Alkaline Phosphatase: 71 U/L (ref 38–126)
Anion gap: 5 (ref 5–15)
BUN: 15 mg/dL (ref 6–20)
CO2: 34 mmol/L — ABNORMAL HIGH (ref 22–32)
Calcium: 8.6 mg/dL — ABNORMAL LOW (ref 8.9–10.3)
Chloride: 103 mmol/L (ref 98–111)
Creatinine: 0.97 mg/dL (ref 0.61–1.24)
GFR, Estimated: >60 mL/min (ref >60)
Glucose, Bld: 154 mg/dL — ABNORMAL HIGH (ref 70–99)
Potassium: 3.8 mmol/L (ref 3.5–5.1)
Sodium: 142 mmol/L (ref 135–145)
Total Bilirubin: 0.4 mg/dL (ref 0.0–1.2)
Total Protein: 6.4 g/dL — ABNORMAL LOW (ref 6.5–8.1)

## 2024-06-03 MED ORDER — OXYCODONE-ACETAMINOPHEN 7.5-325 MG PO TABS
1.0000 | ORAL_TABLET | Freq: Four times a day (QID) | ORAL | 0 refills | Status: DC | PRN
  Filled 2024-06-04: qty 60, 15d supply, fill #0

## 2024-06-03 MED ORDER — SODIUM CHLORIDE 0.9 % IV SOLN
200.0000 mg | Freq: Once | INTRAVENOUS | Status: AC
  Administered 2024-06-03: 200 mg via INTRAVENOUS
  Filled 2024-06-03: qty 200

## 2024-06-03 MED ORDER — PROCHLORPERAZINE MALEATE 10 MG PO TABS
10.0000 mg | ORAL_TABLET | Freq: Once | ORAL | Status: AC
  Administered 2024-06-03: 10 mg via ORAL

## 2024-06-03 MED ORDER — SODIUM CHLORIDE 0.9 % IV SOLN: Freq: Once | INTRAVENOUS | Status: AC

## 2024-06-03 MED ORDER — SODIUM CHLORIDE 0.9 % IV SOLN
500.0000 mg/m2 | Freq: Once | INTRAVENOUS | Status: AC
  Administered 2024-06-03: 1000 mg via INTRAVENOUS
  Filled 2024-06-03: qty 40

## 2024-06-03 NOTE — Progress Notes (Signed)
 Palliative Medicine [facility]  [phone] [phone]   Name: Maxwell Grant Date: 06/03/2024 MRN: [id number]  DOB: 04-Oct-1965  Patient Care Team: Pura Lenis, MD as PCP - General (Family Medicine)    INTERVAL HISTORY: Maxwell Grant is a 58 y.o. male with oncologic medical history including new diagnosed non-small cell lung cancer (10/2022), as well as a history of COPD, diabetes mellitus, hypertension, dyslipidemia as well as history of osteomyelitis of the back.  Palliative ask to see for symptom and pain management and goals of care.   SOCIAL HISTORY:     reports that he has been smoking cigarettes. He has never used smokeless tobacco. He reports current alcohol use of about 1.0 standard drink of alcohol per week. He reports current drug use. Drug: Marijuana.  ADVANCE DIRECTIVES:  None on file   CODE STATUS: Full code  PAST MEDICAL HISTORY: Past Medical History:  Diagnosis Date   CHF (congestive heart failure) (HCC)    COPD (chronic obstructive pulmonary disease) (HCC)    Diabetes mellitus without complication (HCC)    Dyspnea    Hypertension    Lung cancer (HCC) 10/2022   Right leg DVT (HCC) 12/21/2022    ALLERGIES:  is allergic to statins, aspirin, flexeril [cyclobenzaprine], and penicillins.  MEDICATIONS:  Current Outpatient Medications  Medication Sig Dispense Refill   Accu-Chek FastClix Lancets MISC Use to test blood sugar 4 times a day 102 each 0   albuterol  (PROVENTIL ) (2.5 MG/3ML) 0.083% nebulizer solution Inhale 3 mL (2.5 mg total) by nebulization every 6 (six) hours as needed for wheezing. 360 mL 3   albuterol  (VENTOLIN  HFA) 108 (90 Base) MCG/ACT inhaler Inhale 1-2 puffs into the lungs every 6 (six) hours as needed for wheezing or shortness of breath. 18 g 1   albuterol  (VENTOLIN  HFA) 108 (90 Base) MCG/ACT inhaler Inhale 2 puffs every 6 (six) hours as needed for wheezing. 54 g 3   apixaban  (ELIQUIS ) 5 MG TABS tablet  Take 1 tablet (5 mg total) by mouth 2 (two) times daily. 60 tablet 4   Azelastine  HCl 137 MCG/SPRAY SOLN Place 2 sprays into nostrils 2 (two) times daily. 30 mL 12   benzonatate  (TESSALON ) 100 MG capsule Take 1 capsule (100 mg total) by mouth 3 (three) times daily as needed. 30 capsule 2   fluticasone  (FLONASE ) 50 MCG/ACT nasal spray Place 1 spray into both nostrils daily. 16 g 2   Fluticasone -Umeclidin-Vilant (TRELEGY ELLIPTA ) 100-62.5-25 MCG/ACT AEPB Inhale 1 puff into the lungs daily in the afternoon. 60 each 5   folic acid  (FOLVITE ) 1 MG tablet Take 1 tablet (1 mg total) by mouth daily. 90 tablet 1   furosemide  (LASIX ) 20 MG tablet Take 1 tablet (20 mg) by mouth daily as needed. 7 tablet 0   gabapentin  (NEURONTIN ) 300 MG capsule Take 2 capsules (600mg ) in the morning, 1 capsule (300 mg ) midday, and 2 capsules at bedtime (600mg ). 150 capsule 2   glimepiride (AMARYL) 2 MG tablet Take 2 mg by mouth in the morning.     lidocaine -prilocaine  (EMLA ) cream Apply to [facility] Prior to Access as needed 30 g 2   meloxicam  (MOBIC ) 7.5 MG tablet Take 1 tablet (7.5 mg total) by mouth 2 (two) times daily. 60 tablet 3   ondansetron  (ZOFRAN ) 8 MG tablet Take 1 tablet (8 mg) by mouth every 8 hours as needed for nausea or vomiting. 30 tablet 2   OVER THE COUNTER MEDICATION Take 1 tablet by  mouth daily as needed (constipation).  Stool softener     oxyCODONE  (OXYCONTIN ) 20 mg 12 hr tablet Take 1 tablet (20 mg total) by mouth every 8 (eight) hours. 90 tablet 0   oxyCODONE -acetaminophen  (PERCOCET) 7.5-325 MG tablet Take 1 tablet by mouth every 6 (six) hours as needed for severe pain (pain score 7-10). 60 tablet 0   prochlorperazine  (COMPAZINE ) 10 MG tablet Take 1 tablet (10 mg total) by mouth every 6 (six) hours as needed for nausea or vomiting. 30 tablet 0   TRELEGY ELLIPTA  100-62.5-25 MCG/ACT AEPB Inhale 1 puff once daily. 180 each 3   No current facility-administered medications for this visit.    VITAL  SIGNS: There were no vitals taken for this visit. There were no vitals filed for this visit.  Estimated body mass index is 32.75 kg/m as calculated from the following:   Height as of 04/22/24: 5' 6 (1.676 m).   Weight as of an earlier encounter on 06/03/24: 202 lb 14.4 oz (92 kg).  PERFORMANCE STATUS (ECOG) : 1 - Symptomatic but completely ambulatory  Physical Exam General: NAD Cardiovascular: regular rate and rhythm Pulmonary: normal breathing pattern Extremities: no edema, no joint deformities Skin: no rashes Neurological: AAO x3  IMPRESSION: Discussed the use of AI scribe software for clinical note transcription with the patient, who gave verbal consent to proceed.  History of Present Illness Maxwell Grant is a 58 year old male who presents for a routine follow-up. Accompanied by family. No acute distress. Denies concerns for nausea, vomiting, constipation, or diarrhea. Is remaining as active as possible. His energy levels are good, he is sleeping well, and his appetite has improved. He notes a weight gain of two pounds.   We discussed his pain regimen which consists of OxyContin  every eight hours and Percocet as needed for severe pain, sometimes up to twice a day. He also takes gabapentin , two capsules in the morning and two at night. Pain is effectively managed at this time. No adjustments needed. We will continue to closely monitor and support.   All questions answered and support provided.   Assessment & Plan  Follow-Up Scheduled follow-up for infusion therapy. - Will see patient back in collaboration with scheduled appointments in 3-4 weeks. Sooner if needed.   Cancer pain Pain is well-managed with Oxycontin . He reports good energy levels, sleep, and appetite. He has gained two pounds and wishes to manage his weight. - Ensure prior approval for Oxycontin  refill if needed. - OxyContin  to every 8 hours. - Continue Percocet as needed for severe pain, every 4 to 6  hours.  Patient expressed understanding and was in agreement with this plan. He also understands that He can call the clinic at any time with any questions, concerns, or complaints.   Any controlled substances utilized were prescribed in the context of palliative care. PDMP has been reviewed.   Visit consisted of counseling and education dealing with the complex and emotionally intense issues of symptom management and palliative care in the setting of serious and potentially life-threatening illness.  Levon Borer, AGPCNP-BC  Palliative Medicine Team/[facility]

## 2024-06-04 ENCOUNTER — Other Ambulatory Visit: Payer: Self-pay

## 2024-06-04 ENCOUNTER — Other Ambulatory Visit: Payer: Self-pay | Admitting: Adult Health

## 2024-06-04 ENCOUNTER — Other Ambulatory Visit (HOSPITAL_COMMUNITY): Payer: Self-pay

## 2024-06-04 ENCOUNTER — Other Ambulatory Visit: Payer: Self-pay | Admitting: Nurse Practitioner

## 2024-06-04 ENCOUNTER — Other Ambulatory Visit (HOSPITAL_BASED_OUTPATIENT_CLINIC_OR_DEPARTMENT_OTHER): Payer: Self-pay

## 2024-06-04 DIAGNOSIS — J309 Allergic rhinitis, unspecified: Secondary | ICD-10-CM

## 2024-06-04 MED ORDER — FLUTICASONE PROPIONATE 50 MCG/ACT NA SUSP
1.0000 | Freq: Every day | NASAL | 2 refills | Status: DC
  Filled 2024-06-04: qty 16, 30d supply, fill #0
  Filled 2024-08-11 (×2): qty 16, 30d supply, fill #1
  Filled 2024-09-12: qty 16, 30d supply, fill #2

## 2024-06-05 ENCOUNTER — Other Ambulatory Visit (HOSPITAL_COMMUNITY): Payer: Self-pay

## 2024-06-05 ENCOUNTER — Encounter (HOSPITAL_COMMUNITY): Payer: Self-pay

## 2024-06-06 ENCOUNTER — Other Ambulatory Visit (HOSPITAL_COMMUNITY): Payer: Self-pay

## 2024-06-06 MED ORDER — ELIQUIS 5 MG PO TABS
5.0000 mg | ORAL_TABLET | Freq: Two times a day (BID) | ORAL | 5 refills | Status: AC
  Filled 2024-06-06: qty 60, 30d supply, fill #0
  Filled 2024-07-04: qty 60, 30d supply, fill #1
  Filled 2024-08-11 (×2): qty 60, 30d supply, fill #2
  Filled 2024-09-12: qty 60, 30d supply, fill #3
  Filled 2024-10-14: qty 60, 30d supply, fill #4

## 2024-06-09 ENCOUNTER — Other Ambulatory Visit (HOSPITAL_COMMUNITY): Payer: Self-pay

## 2024-06-09 MED ORDER — GLIMEPIRIDE 2 MG PO TABS
2.0000 mg | ORAL_TABLET | Freq: Every day | ORAL | 1 refills | Status: AC
  Filled 2024-06-09 – 2024-07-30 (×5): qty 90, 90d supply, fill #0
  Filled 2024-08-11: qty 90, 90d supply, fill #1

## 2024-06-11 ENCOUNTER — Other Ambulatory Visit: Payer: Self-pay | Admitting: Nurse Practitioner

## 2024-06-11 ENCOUNTER — Encounter: Payer: Self-pay | Admitting: Internal Medicine

## 2024-06-18 ENCOUNTER — Other Ambulatory Visit: Payer: Self-pay | Admitting: Nurse Practitioner

## 2024-06-18 ENCOUNTER — Other Ambulatory Visit: Payer: Self-pay

## 2024-06-18 ENCOUNTER — Other Ambulatory Visit (HOSPITAL_COMMUNITY): Payer: Self-pay

## 2024-06-18 DIAGNOSIS — R918 Other nonspecific abnormal finding of lung field: Secondary | ICD-10-CM

## 2024-06-18 MED ORDER — LIDOCAINE-PRILOCAINE 2.5-2.5 % EX CREA
TOPICAL_CREAM | CUTANEOUS | 6 refills | Status: AC | PRN
  Filled 2024-06-18: qty 30, 30d supply, fill #0

## 2024-06-21 ENCOUNTER — Encounter (HOSPITAL_COMMUNITY): Payer: Self-pay

## 2024-06-21 ENCOUNTER — Other Ambulatory Visit: Payer: Self-pay | Admitting: Nurse Practitioner

## 2024-06-21 ENCOUNTER — Other Ambulatory Visit (HOSPITAL_COMMUNITY): Payer: Self-pay

## 2024-06-21 DIAGNOSIS — G893 Neoplasm related pain (acute) (chronic): Secondary | ICD-10-CM

## 2024-06-21 DIAGNOSIS — C3490 Malignant neoplasm of unspecified part of unspecified bronchus or lung: Secondary | ICD-10-CM

## 2024-06-21 DIAGNOSIS — Z515 Encounter for palliative care: Secondary | ICD-10-CM

## 2024-06-23 ENCOUNTER — Other Ambulatory Visit (HOSPITAL_COMMUNITY): Payer: Self-pay

## 2024-06-23 ENCOUNTER — Other Ambulatory Visit: Payer: Self-pay

## 2024-06-23 MED ORDER — OXYCODONE-ACETAMINOPHEN 7.5-325 MG PO TABS
1.0000 | ORAL_TABLET | Freq: Four times a day (QID) | ORAL | 0 refills | Status: DC | PRN
  Filled 2024-06-23: qty 60, 15d supply, fill #0

## 2024-06-23 MED ORDER — OXYCODONE HCL ER 20 MG PO T12A
20.0000 mg | EXTENDED_RELEASE_TABLET | Freq: Three times a day (TID) | ORAL | 0 refills | Status: DC
  Filled 2024-06-30: qty 90, 30d supply, fill #0

## 2024-06-24 ENCOUNTER — Inpatient Hospital Stay: Attending: Internal Medicine

## 2024-06-24 ENCOUNTER — Inpatient Hospital Stay (HOSPITAL_BASED_OUTPATIENT_CLINIC_OR_DEPARTMENT_OTHER): Admitting: Internal Medicine

## 2024-06-24 ENCOUNTER — Inpatient Hospital Stay

## 2024-06-24 ENCOUNTER — Inpatient Hospital Stay (HOSPITAL_BASED_OUTPATIENT_CLINIC_OR_DEPARTMENT_OTHER): Admitting: Nurse Practitioner

## 2024-06-24 ENCOUNTER — Encounter: Payer: Self-pay | Admitting: Nurse Practitioner

## 2024-06-24 VITALS — BP 120/72 | HR 68 | Temp 98.1°F | Resp 17 | Ht 66.0 in | Wt 199.0 lb

## 2024-06-24 DIAGNOSIS — F1290 Cannabis use, unspecified, uncomplicated: Secondary | ICD-10-CM | POA: Diagnosis not present

## 2024-06-24 DIAGNOSIS — R0902 Hypoxemia: Secondary | ICD-10-CM | POA: Diagnosis not present

## 2024-06-24 DIAGNOSIS — I10 Essential (primary) hypertension: Secondary | ICD-10-CM | POA: Insufficient documentation

## 2024-06-24 DIAGNOSIS — R0609 Other forms of dyspnea: Secondary | ICD-10-CM | POA: Insufficient documentation

## 2024-06-24 DIAGNOSIS — R630 Anorexia: Secondary | ICD-10-CM | POA: Diagnosis not present

## 2024-06-24 DIAGNOSIS — Z7901 Long term (current) use of anticoagulants: Secondary | ICD-10-CM | POA: Insufficient documentation

## 2024-06-24 DIAGNOSIS — Z880 Allergy status to penicillin: Secondary | ICD-10-CM | POA: Insufficient documentation

## 2024-06-24 DIAGNOSIS — Z886 Allergy status to analgesic agent status: Secondary | ICD-10-CM | POA: Insufficient documentation

## 2024-06-24 DIAGNOSIS — Z515 Encounter for palliative care: Secondary | ICD-10-CM

## 2024-06-24 DIAGNOSIS — R5383 Other fatigue: Secondary | ICD-10-CM | POA: Diagnosis not present

## 2024-06-24 DIAGNOSIS — C3490 Malignant neoplasm of unspecified part of unspecified bronchus or lung: Secondary | ICD-10-CM

## 2024-06-24 DIAGNOSIS — Z5111 Encounter for antineoplastic chemotherapy: Secondary | ICD-10-CM | POA: Diagnosis present

## 2024-06-24 DIAGNOSIS — C3411 Malignant neoplasm of upper lobe, right bronchus or lung: Secondary | ICD-10-CM | POA: Diagnosis present

## 2024-06-24 DIAGNOSIS — M2550 Pain in unspecified joint: Secondary | ICD-10-CM | POA: Diagnosis not present

## 2024-06-24 DIAGNOSIS — E1169 Type 2 diabetes mellitus with other specified complication: Secondary | ICD-10-CM | POA: Insufficient documentation

## 2024-06-24 DIAGNOSIS — C7951 Secondary malignant neoplasm of bone: Secondary | ICD-10-CM | POA: Diagnosis not present

## 2024-06-24 DIAGNOSIS — G893 Neoplasm related pain (acute) (chronic): Secondary | ICD-10-CM | POA: Diagnosis not present

## 2024-06-24 DIAGNOSIS — Z9981 Dependence on supplemental oxygen: Secondary | ICD-10-CM | POA: Insufficient documentation

## 2024-06-24 DIAGNOSIS — R634 Abnormal weight loss: Secondary | ICD-10-CM | POA: Diagnosis not present

## 2024-06-24 DIAGNOSIS — Z888 Allergy status to other drugs, medicaments and biological substances status: Secondary | ICD-10-CM | POA: Diagnosis not present

## 2024-06-24 DIAGNOSIS — Z86718 Personal history of other venous thrombosis and embolism: Secondary | ICD-10-CM | POA: Insufficient documentation

## 2024-06-24 DIAGNOSIS — Z5112 Encounter for antineoplastic immunotherapy: Secondary | ICD-10-CM | POA: Insufficient documentation

## 2024-06-24 DIAGNOSIS — Z79899 Other long term (current) drug therapy: Secondary | ICD-10-CM | POA: Insufficient documentation

## 2024-06-24 DIAGNOSIS — F17210 Nicotine dependence, cigarettes, uncomplicated: Secondary | ICD-10-CM | POA: Insufficient documentation

## 2024-06-24 DIAGNOSIS — R530 Neoplastic (malignant) related fatigue: Secondary | ICD-10-CM

## 2024-06-24 DIAGNOSIS — M549 Dorsalgia, unspecified: Secondary | ICD-10-CM | POA: Insufficient documentation

## 2024-06-24 DIAGNOSIS — C7989 Secondary malignant neoplasm of other specified sites: Secondary | ICD-10-CM | POA: Insufficient documentation

## 2024-06-24 LAB — CBC WITH DIFFERENTIAL (CANCER CENTER ONLY)
Abs Immature Granulocytes: 0.01 K/uL (ref 0.00–0.07)
Basophils Absolute: 0.1 K/uL (ref 0.0–0.1)
Basophils Relative: 1 %
Eosinophils Absolute: 0.3 K/uL (ref 0.0–0.5)
Eosinophils Relative: 7 %
HCT: 39.3 % (ref 39.0–52.0)
Hemoglobin: 12.3 g/dL — ABNORMAL LOW (ref 13.0–17.0)
Immature Granulocytes: 0 %
Lymphocytes Relative: 15 %
Lymphs Abs: 0.6 K/uL — ABNORMAL LOW (ref 0.7–4.0)
MCH: 28.9 pg (ref 26.0–34.0)
MCHC: 31.3 g/dL (ref 30.0–36.0)
MCV: 92.5 fL (ref 80.0–100.0)
Monocytes Absolute: 0.3 K/uL (ref 0.1–1.0)
Monocytes Relative: 8 %
Neutro Abs: 2.6 K/uL (ref 1.7–7.7)
Neutrophils Relative %: 69 %
Platelet Count: 184 K/uL (ref 150–400)
RBC: 4.25 MIL/uL (ref 4.22–5.81)
RDW: 16.1 % — ABNORMAL HIGH (ref 11.5–15.5)
WBC Count: 3.8 K/uL — ABNORMAL LOW (ref 4.0–10.5)
nRBC: 0 % (ref 0.0–0.2)

## 2024-06-24 LAB — CMP (CANCER CENTER ONLY)
ALT: 8 U/L (ref 0–44)
AST: 12 U/L — ABNORMAL LOW (ref 15–41)
Albumin: 3.7 g/dL (ref 3.5–5.0)
Alkaline Phosphatase: 74 U/L (ref 38–126)
Anion gap: 4 — ABNORMAL LOW (ref 5–15)
BUN: 17 mg/dL (ref 6–20)
CO2: 36 mmol/L — ABNORMAL HIGH (ref 22–32)
Calcium: 9.1 mg/dL (ref 8.9–10.3)
Chloride: 101 mmol/L (ref 98–111)
Creatinine: 0.94 mg/dL (ref 0.61–1.24)
GFR, Estimated: >60 mL/min (ref >60)
Glucose, Bld: 147 mg/dL — ABNORMAL HIGH (ref 70–99)
Potassium: 4 mmol/L (ref 3.5–5.1)
Sodium: 141 mmol/L (ref 135–145)
Total Bilirubin: 0.5 mg/dL (ref 0.0–1.2)
Total Protein: 6.8 g/dL (ref 6.5–8.1)

## 2024-06-24 MED ORDER — SODIUM CHLORIDE 0.9 % IV SOLN
500.0000 mg/m2 | Freq: Once | INTRAVENOUS | Status: AC
  Administered 2024-06-24: 1000 mg via INTRAVENOUS
  Filled 2024-06-24: qty 40

## 2024-06-24 MED ORDER — SODIUM CHLORIDE 0.9 % IV SOLN: Freq: Once | INTRAVENOUS | Status: AC

## 2024-06-24 MED ORDER — SODIUM CHLORIDE 0.9 % IV SOLN
200.0000 mg | Freq: Once | INTRAVENOUS | Status: AC
  Administered 2024-06-24: 200 mg via INTRAVENOUS
  Filled 2024-06-24: qty 200

## 2024-06-24 MED ORDER — PROCHLORPERAZINE MALEATE 10 MG PO TABS
10.0000 mg | ORAL_TABLET | Freq: Once | ORAL | Status: AC
  Administered 2024-06-24: 10 mg via ORAL
  Filled 2024-06-24: qty 1

## 2024-06-24 NOTE — Progress Notes (Signed)
 [facility] [phone]   [phone]  OFFICE PROGRESS NOTE  Pura Lenis, MD [address] [place] [address]  DIAGNOSIS:  1) Stage IV (T3, N2, M1 C) non-small cell lung cancer, adenocarcinoma presented with large right upper lobe lung mass in addition to right hilar and subcarinal lymphadenopathy in addition to multiple metastatic bone lesions involving the pelvis as well as the lower thoracic and lumbar spines in addition to metastatic disease to the pelvic muscles diagnosed in February 2024.  2) deep venous thrombosis of the right lower extremity diagnosed on December 18, 2022  Detected Alteration(s) / Biomarker(s) Associated FDA-approved therapies Clinical Trial Availability% cfDNA or Amplification  KRAS G12C approved by FDA Adagrasib, Sotorasib Yes 29.3%  CDK4 Amplification None Yes High (+++)Plasma Copy Number: 8.0  PD-L1 expression 98%  PRIOR THERAPY: None  CURRENT THERAPY:  1) Systemic chemotherapy with carboplatin  for AUC of 5, Alimta  500 Mg/M2 and Keytruda  200 Mg IV every 3 weeks.  First dose November 14, 2022.  Status post 28 cycles.  Starting from cycle #5 the patient is on maintenance treatment with Alimta  and Keytruda  every 3 weeks. 2) Eliquis  10 mg p.o. twice daily for 7 days followed by 5 mg p.o. twice daily.  First dose 12/26/2022.  INTERVAL HISTORY: Maxwell Grant 58 y.o. male returns to the clinic today for follow-up visit after by his sister.  Discussed the use of AI scribe software for clinical note transcription with the patient, who gave verbal consent to proceed.  History of Present Illness Maxwell Grant is a 58 year old male with stage four non-small cell lung cancer who presents for evaluation before starting cycle twenty-nine of maintenance treatment. He is accompanied by his older sister, Cy.  He was diagnosed with stage four non-small cell lung cancer, adenocarcinoma, in February 2024.  Initially, he underwent palliative systemic chemo-immunotherapy with carboplatin , Alimta , and Keytruda  for four cycles. Since then, he has been on maintenance treatment with Alimta  and Keytruda  every three weeks, and he is currently post twenty-eight cycles, presenting today for evaluation before starting cycle twenty-nine.  He experiences low oxygen saturation levels, requiring supplemental oxygen at home, especially during physical activity. He does not use oxygen continuously but needs it when moving around. No new side effects from the chemotherapy, such as nausea or vomiting, and no chest pain or hemoptysis are reported.  He continues to experience back pain, particularly when engaging in physical activities.  MEDICAL HISTORY: Past Medical History:  Diagnosis Date   CHF (congestive heart failure) (HCC)    COPD (chronic obstructive pulmonary disease) (HCC)    Diabetes mellitus without complication (HCC)    Dyspnea    Hypertension    Lung cancer (HCC) 10/2022   Right leg DVT (HCC) 12/21/2022    ALLERGIES:  is allergic to statins, aspirin, flexeril [cyclobenzaprine], and penicillins.  MEDICATIONS:  Current Outpatient Medications  Medication Sig Dispense Refill   Accu-Chek FastClix Lancets MISC Use to test blood sugar 4 times a day 102 each 0   albuterol  (PROVENTIL ) (2.5 MG/3ML) 0.083% nebulizer solution Inhale 3 mL (2.5 mg total) by nebulization every 6 (six) hours as needed for wheezing. 360 mL 3   albuterol  (VENTOLIN  HFA) 108 (90 Base) MCG/ACT inhaler Inhale 1-2 puffs into the lungs every 6 (six) hours as needed for wheezing or shortness of breath. 18 g 1   albuterol  (VENTOLIN  HFA) 108 (90 Base) MCG/ACT inhaler Inhale 2 puffs every 6 (  six) hours as needed for wheezing. 54 g 3   apixaban  (ELIQUIS ) 5 MG TABS tablet Take 1 tablet (5 mg total) by mouth 2 (two) times daily. 60 tablet 5   Azelastine  HCl 137 MCG/SPRAY SOLN Place 2 sprays into nostrils 2 (two) times daily. 30 mL 12    benzonatate  (TESSALON ) 100 MG capsule Take 1 capsule (100 mg total) by mouth 3 (three) times daily as needed. 30 capsule 2   fluticasone  (FLONASE ) 50 MCG/ACT nasal spray Place 1 spray into both nostrils daily. 16 g 2   Fluticasone -Umeclidin-Vilant (TRELEGY ELLIPTA ) 100-62.5-25 MCG/ACT AEPB Inhale 1 puff into the lungs daily in the afternoon. 60 each 5   folic acid  (FOLVITE ) 1 MG tablet Take 1 tablet (1 mg total) by mouth daily. 90 tablet 1   furosemide  (LASIX ) 20 MG tablet Take 1 tablet (20 mg) by mouth daily as needed. 7 tablet 0   gabapentin  (NEURONTIN ) 300 MG capsule Take 2 capsules (600mg ) in the morning, 1 capsule (300 mg ) midday, and 2 capsules at bedtime (600mg ). 150 capsule 2   glimepiride  (AMARYL ) 2 MG tablet Take 2 mg by mouth in the morning.     glimepiride  (AMARYL ) 2 MG tablet Take one tablet (2 mg dose) by mouth 30 (thirty) minutes before breakfast. 90 tablet 1   lidocaine -prilocaine  (EMLA ) cream Apply to [facility] Prior to Access as needed 30 g 6   meloxicam  (MOBIC ) 7.5 MG tablet Take 1 tablet (7.5 mg total) by mouth 2 (two) times daily. 60 tablet 3   ondansetron  (ZOFRAN ) 8 MG tablet Take 1 tablet (8 mg) by mouth every 8 hours as needed for nausea or vomiting. 30 tablet 2   OVER THE COUNTER MEDICATION Take 1 tablet by mouth daily as needed (constipation).  Stool softener     [START ON 06/30/2024] oxyCODONE  (OXYCONTIN ) 20 mg 12 hr tablet Take 1 tablet (20 mg total) by mouth every 8 (eight) hours. 90 tablet 0   oxyCODONE -acetaminophen  (PERCOCET) 7.5-325 MG tablet Take 1 tablet by mouth every 6 (six) hours as needed for severe pain (pain score 7-10). 60 tablet 0   prochlorperazine  (COMPAZINE ) 10 MG tablet Take 1 tablet (10 mg total) by mouth every 6 (six) hours as needed for nausea or vomiting. 30 tablet 0   TRELEGY ELLIPTA  100-62.5-25 MCG/ACT AEPB Inhale 1 puff once daily. 180 each 3   No current facility-administered medications for this visit.    SURGICAL HISTORY:  Past Surgical  History:  Procedure Laterality Date   BRONCHIAL NEEDLE ASPIRATION BIOPSY  10/30/2022   Procedure: BRONCHIAL NEEDLE ASPIRATION BIOPSIES;  Surgeon: Shelah Lamar RAMAN, MD;  Location: [facility] ENDOSCOPY;  Service: Cardiopulmonary;;   IR IMAGING GUIDED PORT INSERTION  11/17/2022   NO PAST SURGERIES     RADIOLOGY WITH ANESTHESIA  10/30/2022   Procedure: MRI WITH ANESTHESIA W/WO CONTRAST;  Surgeon: Shelah Lamar RAMAN, MD;  Location[facility] ENDOSCOPY;  Service: Cardiopulmonary;;   VIDEO BRONCHOSCOPY WITH ENDOBRONCHIAL ULTRASOUND Right 10/30/2022   Procedure: VIDEO BRONCHOSCOPY WITH ENDOBRONCHIAL ULTRASOUND;  Surgeon: Shelah Lamar RAMAN, MD;  Location: [facility] ENDOSCOPY;  Service: Cardiopulmonary;  Laterality: Right;    REVIEW OF SYSTEMS:  A comprehensive review of systems was negative except for: Constitutional: positive for fatigue Respiratory: positive for dyspnea on exertion Musculoskeletal: positive for arthralgias and back pain   PHYSICAL EXAMINATION: General appearance: alert, cooperative, fatigued, and no distress Head: Normocephalic, without obvious abnormality, atraumatic Neck: no adenopathy, no JVD, supple, symmetrical, trachea midline, and thyroid  not enlarged, symmetric, no tenderness/mass/nodules  Lymph nodes: Cervical, supraclavicular, and axillary nodes normal. Resp: clear to auscultation bilaterally Back: symmetric, no curvature. ROM normal. No CVA tenderness. Cardio: regular rate and rhythm, S1, S2 normal, no murmur, click, rub or gallop GI: soft, non-tender; bowel sounds normal; no masses,  no organomegaly Extremities: extremities normal, atraumatic, no cyanosis or edema  ECOG PERFORMANCE STATUS: 1 - Symptomatic but completely ambulatory  Blood pressure 120/72, pulse 68, temperature 98.1 F (36.7 C), resp. rate 17, height 5' 6 (1.676 m), weight 199 lb (90.3 kg), SpO2 92%.  LABORATORY DATA: Lab Results  Component Value Date   WBC 3.8 (L) 06/24/2024   HGB 12.3 (L) 06/24/2024   HCT 39.3 06/24/2024    MCV 92.5 06/24/2024   PLT 184 06/24/2024      Chemistry      Component Value Date/Time   NA 142 06/03/2024 0754   K 3.8 06/03/2024 0754   CL 103 06/03/2024 0754   CO2 34 (H) 06/03/2024 0754   BUN 15 06/03/2024 0754   CREATININE 0.97 06/03/2024 0754      Component Value Date/Time   CALCIUM  8.6 (L) 06/03/2024 0754   ALKPHOS 71 06/03/2024 0754   AST 14 (L) 06/03/2024 0754   ALT 11 06/03/2024 0754   BILITOT 0.4 06/03/2024 0754       RADIOGRAPHIC STUDIES: No results found.     ASSESSMENT AND PLAN: This is a very pleasant 58 years old white male with stage IV (T3, N2, M1 C) non-small cell lung cancer, adenocarcinoma presented with large right upper lobe lung mass in addition to right hilar and subcarinal lymphadenopathy in addition to multiple metastatic bone lesions involving the pelvis as well as the lower thoracic and lumbar spines in addition to metastatic disease to the pelvic muscles diagnosed in February 2024.  Molecular studies showed positive KRAS G12C mutation and PD-L1 expression of 98%. The patient is here today to start the first cycle of systemic chemotherapy with carboplatin  for AUC of 5, Alimta  500 Mg/M2 and Keytruda  200 Mg IV every 3 weeks.  First dose November 14, 2022.  Status post 28 cycles.  Starting from cycle #5 the patient is on maintenance treatment with Alimta  and Keytruda  every 3 weeks.  He has been tolerating this treatment fairly well. Assessment and Plan Assessment & Plan Stage 4 non-small cell lung cancer, adenocarcinoma Stage 4 non-small cell lung cancer, adenocarcinoma, diagnosed in February 2024. Currently on maintenance treatment with Alimta  and Keytruda  every three weeks, post 28 cycles. No significant side effects reported from chemotherapy. No nausea, vomiting, chest pain, or hemoptysis. Back pain noted with activity, managed by palliative care team. - Continue maintenance treatment with Alimta  and Keytruda  every three weeks - Order scan in two  weeks to evaluate disease status - Schedule follow-up appointment in three weeks for evaluation before cycle 30 - Ensure palliative care team continues to manage pain medications  Chronic hypoxemia requiring supplemental oxygen Chronic hypoxemia requiring supplemental oxygen. Oxygen saturation low, uses supplemental oxygen primarily with activity. No oxygen use during the visit, but requires it when active. - Continue use of supplemental oxygen as needed, especially with activity The patient was advised to call immediately if he has any concerning symptoms in the interval. The patient voices understanding of current disease status and treatment options and is in agreement with the current care plan.  All questions were answered. The patient knows to call the clinic with any problems, questions or concerns. We can certainly see the patient much sooner if necessary.  The total time spent in the appointment was 20 minutes.  Disclaimer: This note was dictated with voice recognition software. Similar sounding words can inadvertently be transcribed and may not be corrected upon review.

## 2024-06-24 NOTE — Patient Instructions (Signed)
 [facility] - A DEPT OF [facility]  Discharge Instructions: Thank you for choosing [facility] to provide your oncology and hematology care.   If you have a lab appointment with the Cancer Center, please go directly to the Cancer Center and check in at the registration area.   Wear comfortable clothing and clothing appropriate for easy access to any Portacath or PICC line.   We strive to give you quality time with your provider. You may need to reschedule your appointment if you arrive late (15 or more minutes).  Arriving late affects you and other patients whose appointments are after yours.  Also, if you miss three or more appointments without notifying the office, you may be dismissed from the clinic at the provider's discretion.      For prescription refill requests, have your pharmacy contact our office and allow 72 hours for refills to be completed.    Today you received the following chemotherapy and/or immunotherapy agents: Keytruda/Alimta      To help prevent nausea and vomiting after your treatment, we encourage you to take your nausea medication as directed.  BELOW ARE SYMPTOMS THAT SHOULD BE REPORTED IMMEDIATELY: *FEVER GREATER THAN 100.4 F (38 C) OR HIGHER *CHILLS OR SWEATING *NAUSEA AND VOMITING THAT IS NOT CONTROLLED WITH YOUR NAUSEA MEDICATION *UNUSUAL SHORTNESS OF BREATH *UNUSUAL BRUISING OR BLEEDING *URINARY PROBLEMS (pain or burning when urinating, or frequent urination) *BOWEL PROBLEMS (unusual diarrhea, constipation, pain near the anus) TENDERNESS IN MOUTH AND THROAT WITH OR WITHOUT PRESENCE OF ULCERS (sore throat, sores in mouth, or a toothache) UNUSUAL RASH, SWELLING OR PAIN  UNUSUAL VAGINAL DISCHARGE OR ITCHING   Items with * indicate a potential emergency and should be followed up as soon as possible or go to the Emergency Department if any problems should occur.  Please show the CHEMOTHERAPY ALERT CARD or  IMMUNOTHERAPY ALERT CARD at check-in to the Emergency Department and triage nurse.  Should you have questions after your visit or need to cancel or reschedule your appointment, please contact [facility] - A DEPT OF [facility]  Dept: [phone]  and follow the prompts.  Office hours are 8:00 a.m. to 4:30 p.m. Monday - Friday. Please note that voicemails left after 4:00 p.m. may not be returned until the following business day.  We are closed weekends and major holidays. You have access to a nurse at all times for urgent questions. Please call the main number to the clinic Dept: [phone] and follow the prompts.   For any non-urgent questions, you may also contact your provider using MyChart. We now offer e-Visits for anyone 6 and older to request care online for non-urgent symptoms. For details visit [email or web address].   Also download the MyChart app! Go to the app store, search "MyChart", open the app, select [place], and log in with your MyChart username and password.

## 2024-06-24 NOTE — Progress Notes (Signed)
 Palliative Medicine [facility]  [phone] [phone]) [phone]   Name: Maxwell Grant Date: 06/24/2024 MRN: [id number]  DOB: August 10, 1966  Patient Care Team: Pura Lenis, MD as PCP - General (Family Medicine)    INTERVAL HISTORY: Maxwell Grant is a 58 y.o. male with oncologic medical history including new diagnosed non-small cell lung cancer (10/2022), as well as a history of COPD, diabetes mellitus, hypertension, dyslipidemia as well as history of osteomyelitis of the back.  Palliative ask to see for symptom and pain management and goals of care.   SOCIAL HISTORY:     reports that he has been smoking cigarettes. He has never used smokeless tobacco. He reports current alcohol use of about 1.0 standard drink of alcohol per week. He reports current drug use. Drug: Marijuana.  ADVANCE DIRECTIVES:  None on file   CODE STATUS: Full code  PAST MEDICAL HISTORY: Past Medical History:  Diagnosis Date   CHF (congestive heart failure) (HCC)    COPD (chronic obstructive pulmonary disease) (HCC)    Diabetes mellitus without complication (HCC)    Dyspnea    Hypertension    Lung cancer (HCC) 10/2022   Right leg DVT (HCC) 12/21/2022    ALLERGIES:  is allergic to statins, aspirin, flexeril [cyclobenzaprine], and penicillins.  MEDICATIONS:  Current Outpatient Medications  Medication Sig Dispense Refill   Accu-Chek FastClix Lancets MISC Use to test blood sugar 4 times a day 102 each 0   albuterol  (PROVENTIL ) (2.5 MG/3ML) 0.083% nebulizer solution Inhale 3 mL (2.5 mg total) by nebulization every 6 (six) hours as needed for wheezing. 360 mL 3   albuterol  (VENTOLIN  HFA) 108 (90 Base) MCG/ACT inhaler Inhale 1-2 puffs into the lungs every 6 (six) hours as needed for wheezing or shortness of breath. 18 g 1   albuterol  (VENTOLIN  HFA) 108 (90 Base) MCG/ACT inhaler Inhale 2 puffs every 6 (six) hours as needed for wheezing. 54 g 3   apixaban  (ELIQUIS ) 5 MG TABS tablet  Take 1 tablet (5 mg total) by mouth 2 (two) times daily. 60 tablet 5   Azelastine  HCl 137 MCG/SPRAY SOLN Place 2 sprays into nostrils 2 (two) times daily. 30 mL 12   benzonatate  (TESSALON ) 100 MG capsule Take 1 capsule (100 mg total) by mouth 3 (three) times daily as needed. 30 capsule 2   fluticasone  (FLONASE ) 50 MCG/ACT nasal spray Place 1 spray into both nostrils daily. 16 g 2   Fluticasone -Umeclidin-Vilant (TRELEGY ELLIPTA ) 100-62.5-25 MCG/ACT AEPB Inhale 1 puff into the lungs daily in the afternoon. 60 each 5   folic acid  (FOLVITE ) 1 MG tablet Take 1 tablet (1 mg total) by mouth daily. 90 tablet 1   furosemide  (LASIX ) 20 MG tablet Take 1 tablet (20 mg) by mouth daily as needed. 7 tablet 0   gabapentin  (NEURONTIN ) 300 MG capsule Take 2 capsules (600mg ) in the morning, 1 capsule (300 mg ) midday, and 2 capsules at bedtime (600mg ). 150 capsule 2   glimepiride  (AMARYL ) 2 MG tablet Take 2 mg by mouth in the morning.     glimepiride  (AMARYL ) 2 MG tablet Take one tablet (2 mg dose) by mouth 30 (thirty) minutes before breakfast. 90 tablet 1   lidocaine -prilocaine  (EMLA ) cream Apply to [facility] Prior to Access as needed 30 g 6   meloxicam  (MOBIC ) 7.5 MG tablet Take 1 tablet (7.5 mg total) by mouth 2 (two) times daily. 60 tablet 3   ondansetron  (ZOFRAN ) 8 MG tablet Take 1 tablet (8 mg) by  mouth every 8 hours as needed for nausea or vomiting. 30 tablet 2   OVER THE COUNTER MEDICATION Take 1 tablet by mouth daily as needed (constipation).  Stool softener     [START ON 06/30/2024] oxyCODONE  (OXYCONTIN ) 20 mg 12 hr tablet Take 1 tablet (20 mg total) by mouth every 8 (eight) hours. 90 tablet 0   oxyCODONE -acetaminophen  (PERCOCET) 7.5-325 MG tablet Take 1 tablet by mouth every 6 (six) hours as needed for severe pain (pain score 7-10). 60 tablet 0   prochlorperazine  (COMPAZINE ) 10 MG tablet Take 1 tablet (10 mg total) by mouth every 6 (six) hours as needed for nausea or vomiting. 30 tablet 0   TRELEGY ELLIPTA   100-62.5-25 MCG/ACT AEPB Inhale 1 puff once daily. 180 each 3   No current facility-administered medications for this visit.    VITAL SIGNS: There were no vitals taken for this visit. There were no vitals filed for this visit.  Estimated body mass index is 32.12 kg/m as calculated from the following:   Height as of an earlier encounter on 06/24/24: 5' 6 (1.676 m).   Weight as of an earlier encounter on 06/24/24: 199 lb (90.3 kg).  PERFORMANCE STATUS (ECOG) : 1 - Symptomatic but completely ambulatory  Physical Exam General: NAD Cardiovascular: regular rate and rhythm Pulmonary: normal breathing pattern Extremities: no edema, no joint deformities Skin: no rashes Neurological: AAO x3  IMPRESSION: Discussed the use of AI scribe software for clinical note transcription with the patient, who gave verbal consent to proceed.  History of Present Illness Maxwell Grant is a 58 year old male who was seen during infusion for follow-up. Accompanied by family. No acute distress. Denies concerns for nausea, vomiting, constipation, or diarrhea. Is remaining as active as possible. Reports doing well overall. Occasional fatigue however is remaining as active as possible.   Maxwell Grant reports good appetite. Some days are better than others. Weight today is 199lbs down from 202lbs. We discussed continuing to focus on small frequent meals and pushing protein intake.   We discussed his pain regimen which consists of OxyContin  every eight hours and Percocet as needed for severe pain, sometimes up to twice a day. He also takes gabapentin , two capsules in the morning and two at night. Pain is effectively managed at this time. No adjustments needed. We will continue to closely monitor and support.   All questions answered and support provided.   Assessment & Plan  Follow-Up Scheduled follow-up for infusion therapy. - Will see patient back in collaboration with scheduled appointments in 3-4  weeks. Sooner if needed.   Cancer pain Pain is well-managed with Oxycontin . He reports good energy levels, sleep, and appetite. He has gained two pounds and wishes to manage his weight. - Ensure prior approval for Oxycontin  refill if needed. - OxyContin  to every 8 hours. - Continue Percocet as needed for severe pain, every 4 to 6 hours.  Patient expressed understanding and was in agreement with this plan. He also understands that He can call the clinic at any time with any questions, concerns, or complaints.   Any controlled substances utilized were prescribed in the context of palliative care. PDMP has been reviewed.   Visit consisted of counseling and education dealing with the complex and emotionally intense issues of symptom management and palliative care in the setting of serious and potentially life-threatening illness.  Maxwell Grant, AGPCNP-BC  Palliative Medicine Team/[facility]

## 2024-06-27 ENCOUNTER — Other Ambulatory Visit: Payer: Self-pay

## 2024-06-27 ENCOUNTER — Other Ambulatory Visit: Payer: Self-pay | Admitting: Nurse Practitioner

## 2024-06-27 DIAGNOSIS — G893 Neoplasm related pain (acute) (chronic): Secondary | ICD-10-CM

## 2024-06-27 DIAGNOSIS — Z515 Encounter for palliative care: Secondary | ICD-10-CM

## 2024-06-27 DIAGNOSIS — C3490 Malignant neoplasm of unspecified part of unspecified bronchus or lung: Secondary | ICD-10-CM

## 2024-06-29 MED ORDER — OXYCODONE-ACETAMINOPHEN 7.5-325 MG PO TABS
1.0000 | ORAL_TABLET | Freq: Four times a day (QID) | ORAL | 0 refills | Status: DC | PRN
  Filled 2024-06-29 – 2024-07-30 (×2): qty 60, 15d supply, fill #0

## 2024-06-30 ENCOUNTER — Other Ambulatory Visit (HOSPITAL_COMMUNITY): Payer: Self-pay

## 2024-07-04 ENCOUNTER — Other Ambulatory Visit (HOSPITAL_COMMUNITY): Payer: Self-pay

## 2024-07-04 ENCOUNTER — Other Ambulatory Visit: Payer: Self-pay

## 2024-07-04 MED ORDER — ALBUTEROL SULFATE (2.5 MG/3ML) 0.083% IN NEBU
3.0000 mL | INHALATION_SOLUTION | Freq: Four times a day (QID) | RESPIRATORY_TRACT | 3 refills | Status: AC
  Filled 2024-07-04: qty 360, 30d supply, fill #0
  Filled 2024-10-23: qty 360, 30d supply, fill #1

## 2024-07-08 ENCOUNTER — Encounter: Payer: Self-pay | Admitting: Internal Medicine

## 2024-07-08 ENCOUNTER — Ambulatory Visit (HOSPITAL_COMMUNITY)
Admission: RE | Admit: 2024-07-08 | Discharge: 2024-07-08 | Disposition: A | Discharge status: Home / Self Care | Source: Ambulatory Visit | Attending: Internal Medicine | Admitting: Internal Medicine

## 2024-07-08 ENCOUNTER — Other Ambulatory Visit (HOSPITAL_COMMUNITY): Payer: Self-pay

## 2024-07-08 DIAGNOSIS — C3490 Malignant neoplasm of unspecified part of unspecified bronchus or lung: Secondary | ICD-10-CM | POA: Diagnosis present

## 2024-07-08 MED ORDER — IOHEXOL 300 MG/ML  SOLN
100.0000 mL | Freq: Once | INTRAMUSCULAR | Status: AC | PRN
  Administered 2024-07-08: 100 mL via INTRAVENOUS

## 2024-07-08 MED ORDER — HEPARIN SOD (PORK) LOCK FLUSH 100 UNIT/ML IV SOLN
500.0000 [IU] | Freq: Once | INTRAVENOUS | Status: AC
  Administered 2024-07-08: 500 [IU] via INTRAVENOUS

## 2024-07-08 MED ORDER — AZITHROMYCIN 250 MG PO TABS
ORAL_TABLET | ORAL | 0 refills | Status: DC
  Filled 2024-07-08: qty 6, 5d supply, fill #0

## 2024-07-08 MED ORDER — SODIUM CHLORIDE (PF) 0.9 % IJ SOLN
INTRAMUSCULAR | Status: AC
  Filled 2024-07-08: qty 50

## 2024-07-08 MED ORDER — HEPARIN SOD (PORK) LOCK FLUSH 100 UNIT/ML IV SOLN
INTRAVENOUS | Status: AC
  Filled 2024-07-08: qty 5

## 2024-07-08 MED ORDER — PREDNISONE 10 MG PO TABS
ORAL_TABLET | ORAL | 0 refills | Status: DC
  Filled 2024-07-08: qty 12, 6d supply, fill #0

## 2024-07-13 ENCOUNTER — Other Ambulatory Visit: Payer: Self-pay

## 2024-07-13 ENCOUNTER — Emergency Department (HOSPITAL_COMMUNITY)
Admission: EM | Admit: 2024-07-13 | Discharge: 2024-07-13 | Disposition: A | Discharge status: Home / Self Care | Attending: Emergency Medicine | Admitting: Emergency Medicine

## 2024-07-13 ENCOUNTER — Emergency Department (HOSPITAL_COMMUNITY)

## 2024-07-13 DIAGNOSIS — C3490 Malignant neoplasm of unspecified part of unspecified bronchus or lung: Secondary | ICD-10-CM

## 2024-07-13 DIAGNOSIS — R0602 Shortness of breath: Secondary | ICD-10-CM | POA: Diagnosis present

## 2024-07-13 DIAGNOSIS — Z79899 Other long term (current) drug therapy: Secondary | ICD-10-CM | POA: Diagnosis not present

## 2024-07-13 DIAGNOSIS — Z7901 Long term (current) use of anticoagulants: Secondary | ICD-10-CM | POA: Diagnosis not present

## 2024-07-13 DIAGNOSIS — J441 Chronic obstructive pulmonary disease with (acute) exacerbation: Secondary | ICD-10-CM | POA: Diagnosis not present

## 2024-07-13 LAB — CBC WITH DIFFERENTIAL/PLATELET
Abs Immature Granulocytes: 0.07 K/uL (ref 0.00–0.07)
Basophils Absolute: 0 K/uL (ref 0.0–0.1)
Basophils Relative: 0 %
Eosinophils Absolute: 0 K/uL (ref 0.0–0.5)
Eosinophils Relative: 0 %
HCT: 47.4 % (ref 39.0–52.0)
Hemoglobin: 14 g/dL (ref 13.0–17.0)
Immature Granulocytes: 1 %
Lymphocytes Relative: 4 %
Lymphs Abs: 0.5 K/uL — ABNORMAL LOW (ref 0.7–4.0)
MCH: 28.4 pg (ref 26.0–34.0)
MCHC: 29.5 g/dL — ABNORMAL LOW (ref 30.0–36.0)
MCV: 96.1 fL (ref 80.0–100.0)
Monocytes Absolute: 0.4 K/uL (ref 0.1–1.0)
Monocytes Relative: 4 %
Neutro Abs: 10.1 K/uL — ABNORMAL HIGH (ref 1.7–7.7)
Neutrophils Relative %: 91 %
Platelets: 202 K/uL (ref 150–400)
RBC: 4.93 MIL/uL (ref 4.22–5.81)
RDW: 16 % — ABNORMAL HIGH (ref 11.5–15.5)
WBC: 11.1 K/uL — ABNORMAL HIGH (ref 4.0–10.5)
nRBC: 0 % (ref 0.0–0.2)

## 2024-07-13 LAB — RESP PANEL BY RT-PCR (RSV, FLU A&B, COVID)  RVPGX2
Influenza A by PCR: NEGATIVE
Influenza B by PCR: NEGATIVE
Resp Syncytial Virus by PCR: NEGATIVE
SARS Coronavirus 2 by RT PCR: NEGATIVE

## 2024-07-13 LAB — COMPREHENSIVE METABOLIC PANEL WITH GFR
ALT: 7 U/L (ref 0–44)
AST: 20 U/L (ref 15–41)
Albumin: 4.5 g/dL (ref 3.5–5.0)
Alkaline Phosphatase: 101 U/L (ref 38–126)
Anion gap: 9 (ref 5–15)
BUN: 15 mg/dL (ref 6–20)
CO2: 39 mmol/L — ABNORMAL HIGH (ref 22–32)
Calcium: 10 mg/dL (ref 8.9–10.3)
Chloride: 92 mmol/L — ABNORMAL LOW (ref 98–111)
Creatinine, Ser: 0.84 mg/dL (ref 0.61–1.24)
GFR, Estimated: >60 mL/min (ref >60)
Glucose, Bld: 151 mg/dL — ABNORMAL HIGH (ref 70–99)
Potassium: 4.1 mmol/L (ref 3.5–5.1)
Sodium: 140 mmol/L (ref 135–145)
Total Bilirubin: 0.9 mg/dL (ref 0.0–1.2)
Total Protein: 8.5 g/dL — ABNORMAL HIGH (ref 6.5–8.1)

## 2024-07-13 LAB — D-DIMER, QUANTITATIVE: D-Dimer, Quant: 0.39 ug{FEU}/mL (ref 0.00–0.50)

## 2024-07-13 LAB — PRO BRAIN NATRIURETIC PEPTIDE: Pro Brain Natriuretic Peptide: 516 pg/mL — ABNORMAL HIGH (ref <300.0)

## 2024-07-13 MED ORDER — METHYLPREDNISOLONE SODIUM SUCC 125 MG IJ SOLR
125.0000 mg | Freq: Once | INTRAMUSCULAR | Status: AC
  Administered 2024-07-13: 125 mg via INTRAVENOUS
  Filled 2024-07-13: qty 2

## 2024-07-13 MED ORDER — SODIUM CHLORIDE 0.9 % IV SOLN
500.0000 mg | Freq: Once | INTRAVENOUS | Status: AC
  Administered 2024-07-13: 500 mg via INTRAVENOUS
  Filled 2024-07-13: qty 5

## 2024-07-13 MED ORDER — FUROSEMIDE 20 MG PO TABS
20.0000 mg | ORAL_TABLET | Freq: Every day | ORAL | 0 refills | Status: AC
  Filled 2024-07-13 – 2024-07-14 (×2): qty 7, 7d supply, fill #0

## 2024-07-13 MED ORDER — PREDNISONE 20 MG PO TABS
40.0000 mg | ORAL_TABLET | Freq: Every day | ORAL | 0 refills | Status: DC
  Filled 2024-07-13 – 2024-07-14 (×2): qty 8, 4d supply, fill #0

## 2024-07-13 MED ORDER — AZITHROMYCIN 250 MG PO TABS
250.0000 mg | ORAL_TABLET | Freq: Every day | ORAL | 0 refills | Status: AC
  Filled 2024-07-13 – 2024-07-14 (×2): qty 4, 4d supply, fill #0

## 2024-07-13 MED ORDER — ALBUTEROL SULFATE (2.5 MG/3ML) 0.083% IN NEBU
10.0000 mg/h | INHALATION_SOLUTION | Freq: Once | RESPIRATORY_TRACT | Status: AC
  Administered 2024-07-13: 10 mg/h via RESPIRATORY_TRACT
  Filled 2024-07-13: qty 12

## 2024-07-13 NOTE — ED Triage Notes (Addendum)
 BIBA from home for increased SHOB, cough for 6 days. Pt just finished prednisone . Home O2 on 4 lpm. 2 duo nebs given PTA. 186 cbg 188/114 bp 70 hr 100%

## 2024-07-13 NOTE — ED Notes (Signed)
 Pt friend is bringing his O2 tank from home for pt d/c

## 2024-07-13 NOTE — Discharge Instructions (Signed)
 As discussed, with your COPD exacerbation it is important to take all medication as prescribed and follow-up with your physician.  In particular, use your albuterol  every 4 hours for the next 2 days.  You may then use it as needed.  Return here for concerning changes in your condition.

## 2024-07-13 NOTE — ED Provider Notes (Signed)
 [place] EMERGENCY DEPARTMENT AT [facility] Provider Note   CSN: [id number] Arrival date & time: 07/13/24  1544     Patient presents with: Shortness of Breath   Maxwell Grant is a 58 y.o. male.   HPI Patient with ongoing chemotherapy for lung cancer presents with shortness of breath via EMS.  History obtained by EMS individuals and the patient himself. Patient is that he completed a 5 days course of steroids yesterday, continues to have shortness of breath that began about a week ago. No new fever, vomiting, fall. EMS reports patient was tachypneic, tachycardic, required additional oxygen and bronchodilator in transport.     Prior to Admission medications   Medication Sig Start Date End Date Taking? Authorizing Provider  azithromycin  (ZITHROMAX ) 250 MG tablet Take 1 tablet (250 mg total) by mouth daily for 4 days. Take 1 every day until finished. 07/13/24 07/17/24 Yes Garrick Charleston, MD  predniSONE  (DELTASONE ) 20 MG tablet Take 2 tablets (40 mg total) by mouth daily with breakfast. For the next four days 07/13/24  Yes Garrick Charleston, MD  Accu-Chek FastClix Lancets MISC Use to test blood sugar 4 times a day 03/18/21   Vernon Ranks, MD  albuterol  (PROVENTIL ) (2.5 MG/3ML) 0.083% nebulizer solution Inhale 3 mL (2.5 mg total) by nebulization every 6 (six) hours as needed for wheezing. 07/04/24     albuterol  (VENTOLIN  HFA) 108 (90 Base) MCG/ACT inhaler Inhale 1-2 puffs into the lungs every 6 (six) hours as needed for wheezing or shortness of breath. 07/20/23   Mannam, Praveen, MD  albuterol  (VENTOLIN  HFA) 108 (90 Base) MCG/ACT inhaler Inhale 2 puffs every 6 (six) hours as needed for wheezing. 09/20/23     apixaban  (ELIQUIS ) 5 MG TABS tablet Take 1 tablet (5 mg total) by mouth 2 (two) times daily. 06/06/24     Azelastine  HCl 137 MCG/SPRAY SOLN Place 2 sprays into nostrils 2 (two) times daily. 12/18/23   Pickenpack-Cousar, Fannie SAILOR, NP  azithromycin  (ZITHROMAX ) 250 MG tablet Take  2 tablets (500 mg total) by mouth on day 1 THEN take 1 tablet (250 mg total) daily for 4 days. 07/08/24     benzonatate  (TESSALON ) 100 MG capsule Take 1 capsule (100 mg total) by mouth 3 (three) times daily as needed. 07/03/23   Heilingoetter, Cassandra L, PA-C  fluticasone  (FLONASE ) 50 MCG/ACT nasal spray Place 1 spray into both nostrils daily. 06/04/24   Pickenpack-Cousar, Athena N, NP  Fluticasone -Umeclidin-Vilant (TRELEGY ELLIPTA ) 100-62.5-25 MCG/ACT AEPB Inhale 1 puff into the lungs daily in the afternoon. 01/15/23   Shellia Oh, MD  folic acid  (FOLVITE ) 1 MG tablet Take 1 tablet (1 mg total) by mouth daily. 03/07/24   Pickenpack-Cousar, Athena N, NP  furosemide  (LASIX ) 20 MG tablet 1 tablet daily, 20 mg, for 1 week. 07/13/24   Garrick Charleston, MD  gabapentin  (NEURONTIN ) 300 MG capsule Take 2 capsules (600mg ) in the morning, 1 capsule (300 mg ) midday, and 2 capsules at bedtime (600mg ). 05/13/24   Pickenpack-Cousar, Athena N, NP  glimepiride  (AMARYL ) 2 MG tablet Take 2 mg by mouth in the morning. 02/26/21   [provider]  glimepiride  (AMARYL ) 2 MG tablet Take one tablet (2 mg dose) by mouth 30 (thirty) minutes before breakfast. 06/09/24     lidocaine -prilocaine  (EMLA ) cream Apply to [facility] Prior to Access as needed 06/18/24   Pickenpack-Cousar, Athena N, NP  meloxicam  (MOBIC ) 7.5 MG tablet Take 1 tablet (7.5 mg total) by mouth 2 (two) times daily. 03/07/24   Pickenpack-Cousar,  Athena N, NP  ondansetron  (ZOFRAN ) 8 MG tablet Take 1 tablet (8 mg) by mouth every 8 hours as needed for nausea or vomiting. 02/04/24   Sherrod Sherrod, MD  OVER THE COUNTER MEDICATION Take 1 tablet by mouth daily as needed (constipation).  Stool softener    [provider]  oxyCODONE  (OXYCONTIN ) 20 mg 12 hr tablet Take 1 tablet (20 mg total) by mouth every 8 (eight) hours. 06/30/24   Pickenpack-Cousar, Athena N, NP  oxyCODONE -acetaminophen  (PERCOCET) 7.5-325 MG tablet Take 1 tablet by mouth every 6 (six) hours  as needed for severe pain (pain score 7-10). 06/29/24   Pickenpack-Cousar, Athena N, NP  predniSONE  (DELTASONE ) 10 MG tablet Take 3 tablets (30 mg total) by mouth daily for 2 days, THEN 2 tablets (20 mg total) daily for 2 days, THEN 1 tablet (10 mg total) daily for 2 days. 07/08/24     prochlorperazine  (COMPAZINE ) 10 MG tablet Take 1 tablet (10 mg total) by mouth every 6 (six) hours as needed for nausea or vomiting. 04/02/23   Heilingoetter, Cassandra L, PA-C  TRELEGY ELLIPTA  100-62.5-25 MCG/ACT AEPB Inhale 1 puff once daily. 09/20/23       Allergies: Statins, Aspirin, Flexeril [cyclobenzaprine], and Penicillins    Review of Systems  Updated Vital Signs BP (!) 148/91   Pulse (!) 101   Temp 98.3 F (36.8 C)   Resp 12   SpO2 100%   Physical Exam Vitals and nursing note reviewed.  Constitutional:      General: He is not in acute distress.    Appearance: He is well-developed. He is ill-appearing and diaphoretic.  HENT:     Head: Normocephalic and atraumatic.  Eyes:     Conjunctiva/sclera: Conjunctivae normal.  Cardiovascular:     Rate and Rhythm: Regular rhythm. Tachycardia present.  Pulmonary:     Effort: Tachypnea and accessory muscle usage present.     Breath sounds: Decreased breath sounds present.  Abdominal:     General: There is no distension.  Skin:    General: Skin is warm.  Neurological:     Mental Status: He is alert and oriented to person, place, and time.     (all labs ordered are listed, but only abnormal results are displayed) Labs Reviewed  COMPREHENSIVE METABOLIC PANEL WITH GFR - Abnormal; Notable for the following components:      Result Value   Chloride 92 (*)    CO2 39 (*)    Glucose, Bld 151 (*)    Total Protein 8.5 (*)    All other components within normal limits  PRO BRAIN NATRIURETIC PEPTIDE - Abnormal; Notable for the following components:   Pro Brain Natriuretic Peptide 516.0 (*)    All other components within normal limits  CBC WITH  DIFFERENTIAL/PLATELET - Abnormal; Notable for the following components:   WBC 11.1 (*)    MCHC 29.5 (*)    RDW 16.0 (*)    Neutro Abs 10.1 (*)    Lymphs Abs 0.5 (*)    All other components within normal limits  RESP PANEL BY RT-PCR (RSV, FLU A&B, COVID)  [id number]  D-DIMER, QUANTITATIVE    EKG: None  Radiology: DG Chest Port 1 View Result Date: 07/13/2024 EXAM: 1 VIEW XRAY OF THE CHEST 07/13/2024 04:51:00 PM COMPARISON: None available. CLINICAL HISTORY: SOB. Pt having increased SHOB, cough for 6 days. Pt just finished prednisone . FINDINGS: LINES, TUBES AND DEVICES: Right chest wall Port-A-Cath in place with tip overlying the superior cavoatrial junction. LUNGS AND  PLEURA: No focal pulmonary opacity. No pulmonary edema. No pleural effusion. No pneumothorax. HEART AND MEDIASTINUM: No acute abnormality of the cardiac and mediastinal silhouettes. Atherosclerotic plaque. BONES AND SOFT TISSUES: Old healed right rib fractures. IMPRESSION: 1. No acute cardiopulmonary process. 2. Right chest wall Port-A-Cath with tip at the superior cavoatrial junction, appropriately positioned. 3. Old healed right rib fractures. Electronically signed by: Norleen Boxer MD 07/13/2024 05:16 PM EDT RP Workstation: [id number]     Procedures   Medications Ordered in the ED  albuterol  (PROVENTIL ) (2.5 MG/3ML) 0.083% nebulizer solution (10 mg/hr Nebulization Given 07/13/24 1807)  methylPREDNISolone  sodium succinate (SOLU-MEDROL ) 125 mg/2 mL injection 125 mg (125 mg Intravenous Given 07/13/24 1818)  azithromycin  (ZITHROMAX ) 500 mg in sodium chloride  0.9 % 250 mL IVPB (500 mg Intravenous New Bag/Given 07/13/24 1819)                                    Medical Decision Making Adult male with history of lung cancer, COPD, prior respiratory failure, home oxygen dependency presents with shortness of breath, respiratory distress.  Patient is awake and alert, providing own history, has notable increased work of breathing  initially, tachypnea, tachycardia.  Broad differential including progression of disease, COPD exacerbation, pneumonia, pulmonary embolism. Patient received bronchodilator as he was switched to our hospital bed, pulse ox 99% 4 L nasal cannula abnormal  had some improvement. Cardiac 100 sinus tach abnormal   Amount and/or Complexity of Data Reviewed Independent Historian: EMS External Data Reviewed: notes. Labs: ordered. Decision-making details documented in ED Course. Radiology: ordered and independent interpretation performed. Decision-making details documented in ED Course. ECG/medicine tests: ordered and independent interpretation performed. Decision-making details documented in ED Course.  Risk Prescription drug management. Decision regarding hospitalization. Diagnosis or treatment significantly limited by social determinants of health.   6:02 PM Wife at bedside, patient smiling somewhat. X-ray without pneumonia, dimer normal, we discussed the findings as far, concern for COPD exacerbation.  Patient has some ongoing wheezing, will receive continuous neb, steroids, azithromycin .  7:30 PM Patient markedly improved.  We discussed today's evaluation, findings, and importance of close outpatient follow-up. With negative D-dimer, no evidence for pneumonia, and improvement here patient will be discharged with continued home oxygen which he is already on, antibiotics, steroids, primary care follow-up.    Final diagnoses:  COPD exacerbation [facility])    ED Discharge Orders          Ordered    azithromycin  (ZITHROMAX ) 250 MG tablet  Daily        07/13/24 1930    furosemide  (LASIX ) 20 MG tablet        07/13/24 1930    predniSONE  (DELTASONE ) 20 MG tablet  Daily with breakfast        07/13/24 1930               Garrick Charleston, MD 07/13/24 1930

## 2024-07-14 ENCOUNTER — Other Ambulatory Visit (HOSPITAL_COMMUNITY): Payer: Self-pay

## 2024-07-15 ENCOUNTER — Inpatient Hospital Stay

## 2024-07-15 ENCOUNTER — Encounter

## 2024-07-15 ENCOUNTER — Inpatient Hospital Stay: Admitting: Internal Medicine

## 2024-07-18 NOTE — Progress Notes (Signed)
 [facility] OFFICE PROGRESS NOTE  Pura Lenis, MD [address] [place] [address]  DIAGNOSIS:  1) Stage IV (T3, N2, M1 C) non-small cell lung cancer, adenocarcinoma presented with large right upper lobe lung mass in addition to right hilar and subcarinal lymphadenopathy in addition to multiple metastatic bone lesions involving the pelvis as well as the lower thoracic and lumbar spines in addition to metastatic disease to the pelvic muscles diagnosed in February 2024.  2) deep venous thrombosis of the right lower extremity diagnosed on December 18, 2022   Detected Alteration(s) / Biomarker(s)         Associated FDA-approved therapies  Clinical Trial Availability% cfDNA or Amplification   KRAS G12C approved by FDA Adagrasib, Sotorasib Yes    29.3%   CDK4 Amplification None Yes            High (+++)Plasma Copy Number: 8.0   PD-L1 expression 98%  PRIOR THERAPY: Palliative radiotherapy to the painful bone metastasis under the care of Dr. Dewey    Radiation to the iliac spine on 12/25/2023  CURRENT THERAPY: 1) Systemic chemotherapy with carboplatin  for AUC of 5, Alimta  500 Mg/M2 and Keytruda  200 Mg IV every 3 weeks.  First dose November 14, 2022.  Status post 29 cycles.  Working from cycle #5 the patient has been on maintenance treatment with Alimta  and Keytruda  every 3 weeks. 2) Eliquis  10 mg p.o. twice daily for 7 days followed by 5 mg p.o. twice daily.  First dose 12/26/2022  INTERVAL HISTORY: Averie Meiner 58 y.o. male returns to the clinic today for a follow-up visit accompanied by his sister. The patient was last seen by Dr. Sherrod 3 weeks ago.   In the interval since last being seen, he was seen in the ER for COPD exacerbation. He was treated with azithromycin , steroids, and nebulizer.  Being discharged has been using his inhalers and nebulizers regularly.  He reports his breathing is 75% of the way better.  He still has a chronic cough which he  occasionally uses over-the-counter medications.  He denies any fever, chills, night sweats, or unexplained weight loss. He sees Dr. Shellia from pulmonary medicine.    He is currently on OxyContin  20 mg every 12 hours, oxycodone  7.5 mg 2x per day, MiraLAX, and Colace. He is currently on Eliquis  for history of DVT in the past. He sees palliative care. He is scheduled to see them today. He also completed palliative radiation to the bones.   He had a recent episode of nausea, vomiting, and diarrhea, which he attributes to a 'bug,' last week and these symptoms have since resolved. No current fever, chills, night sweats, weight loss, headaches, vision changes, rashes, or hemoptysis.  He continues to smoke cigarettes. He averages 1 ppd. Denies any headache or visual changes. Denies any rashes or skin changes. He is compliant with his blood thinner for his history of DVT. The patient recently had a restaging CT scan. He is here today for evaluation and repeat blood work before undergoing cycle #30.  MEDICAL HISTORY: Past Medical History:  Diagnosis Date   CHF (congestive heart failure) (HCC)    COPD (chronic obstructive pulmonary disease) (HCC)    Diabetes mellitus without complication (HCC)    Dyspnea    Hypertension    Lung cancer (HCC) 10/2022   Right leg DVT (HCC) 12/21/2022    ALLERGIES:  is allergic to statins, aspirin, flexeril [cyclobenzaprine], and penicillins.  MEDICATIONS:  Current Outpatient Medications  Medication Sig Dispense Refill   Accu-Chek FastClix Lancets MISC Use to test blood sugar 4 times a day 102 each 0   albuterol  (PROVENTIL ) (2.5 MG/3ML) 0.083% nebulizer solution Inhale 3 mL (2.5 mg total) by nebulization every 6 (six) hours as needed for wheezing. 360 mL 3   albuterol  (VENTOLIN  HFA) 108 (90 Base) MCG/ACT inhaler Inhale 1-2 puffs into the lungs every 6 (six) hours as needed for wheezing or shortness of breath. 18 g 1   albuterol  (VENTOLIN  HFA) 108 (90 Base) MCG/ACT  inhaler Inhale 2 puffs every 6 (six) hours as needed for wheezing. 54 g 3   apixaban  (ELIQUIS ) 5 MG TABS tablet Take 1 tablet (5 mg total) by mouth 2 (two) times daily. 60 tablet 5   Azelastine  HCl 137 MCG/SPRAY SOLN Place 2 sprays into nostrils 2 (two) times daily. 30 mL 12   azithromycin  (ZITHROMAX ) 250 MG tablet Take 2 tablets (500 mg total) by mouth on day 1 THEN take 1 tablet (250 mg total) daily for 4 days. 6 tablet 0   benzonatate  (TESSALON ) 100 MG capsule Take 1 capsule (100 mg total) by mouth 3 (three) times daily as needed. 30 capsule 2   fluticasone  (FLONASE ) 50 MCG/ACT nasal spray Place 1 spray into both nostrils daily. 16 g 2   Fluticasone -Umeclidin-Vilant (TRELEGY ELLIPTA ) 100-62.5-25 MCG/ACT AEPB Inhale 1 puff into the lungs daily in the afternoon. 60 each 5   folic acid  (FOLVITE ) 1 MG tablet Take 1 tablet (1 mg total) by mouth daily. 90 tablet 1   furosemide  (LASIX ) 20 MG tablet Take 1 tablet (20 mg total) by mouth daily for 1 week. 7 tablet 0   gabapentin  (NEURONTIN ) 300 MG capsule Take 2 capsules (600mg ) in the morning, 1 capsule (300 mg ) midday, and 2 capsules at bedtime (600mg ). 150 capsule 2   glimepiride  (AMARYL ) 2 MG tablet Take 2 mg by mouth in the morning.     glimepiride  (AMARYL ) 2 MG tablet Take one tablet (2 mg dose) by mouth 30 (thirty) minutes before breakfast. 90 tablet 1   lidocaine -prilocaine  (EMLA ) cream Apply to [facility] Prior to Access as needed 30 g 6   meloxicam  (MOBIC ) 7.5 MG tablet Take 1 tablet (7.5 mg total) by mouth 2 (two) times daily. 60 tablet 3   ondansetron  (ZOFRAN ) 8 MG tablet Take 1 tablet (8 mg) by mouth every 8 hours as needed for nausea or vomiting. 30 tablet 2   OVER THE COUNTER MEDICATION Take 1 tablet by mouth daily as needed (constipation).  Stool softener     oxyCODONE  (OXYCONTIN ) 20 mg 12 hr tablet Take 1 tablet (20 mg total) by mouth every 8 (eight) hours. 90 tablet 0   oxyCODONE -acetaminophen  (PERCOCET) 7.5-325 MG tablet Take 1 tablet by  mouth every 6 (six) hours as needed for severe pain (pain score 7-10). 60 tablet 0   predniSONE  (DELTASONE ) 10 MG tablet Take 3 tablets (30 mg total) by mouth daily for 2 days, THEN 2 tablets (20 mg total) daily for 2 days, THEN 1 tablet (10 mg total) daily for 2 days. 12 tablet 0   predniSONE  (DELTASONE ) 20 MG tablet Take 2 tablets (40 mg total) by mouth daily with breakfast. For the next four days 8 tablet 0   prochlorperazine  (COMPAZINE ) 10 MG tablet Take 1 tablet (10 mg total) by mouth every 6 (six) hours as needed for nausea or vomiting. 30 tablet 0   TRELEGY ELLIPTA  100-62.5-25 MCG/ACT AEPB Inhale 1 puff once daily. 180 each  3   No current facility-administered medications for this visit.   Facility-Administered Medications Ordered in Other Visits  Medication Dose Route Frequency Provider Last Rate Last Admin   cyanocobalamin  (VITAMIN B12) injection 1,000 mcg  1,000 mcg Intramuscular Once Mohamed, Mohamed, MD       pembrolizumab  (KEYTRUDA ) 200 mg in sodium chloride  0.9 % 50 mL chemo infusion  200 mg Intravenous Once Mohamed, Mohamed, MD       PEMEtrexed  Disodium (ALIMTA ) 1,000 mg in sodium chloride  0.9 % 100 mL chemo infusion  500 mg/m2 (Treatment Plan Recorded) Intravenous Once Sherrod Sherrod, MD        SURGICAL HISTORY:  Past Surgical History:  Procedure Laterality Date   BRONCHIAL NEEDLE ASPIRATION BIOPSY  10/30/2022   Procedure: BRONCHIAL NEEDLE ASPIRATION BIOPSIES;  Surgeon: Shelah Lamar RAMAN, MD;  Location: [facility];  Service: Cardiopulmonary;;   IR IMAGING GUIDED PORT INSERTION  11/17/2022   NO PAST SURGERIES     RADIOLOGY WITH ANESTHESIA  10/30/2022   Procedure: MRI WITH ANESTHESIA W/WO CONTRAST;  Surgeon: Shelah Lamar RAMAN, MD;  Location: [facility]OS[facility];  Service: Cardiopulmonary;;   VIDEO BRONCHOSCOPY WITH ENDOBRONCHIAL ULTRASOUND Right 10/30/2022   Procedure: VIDEO BRONCHOSCOPY WITH ENDOBRONCHIAL ULTRASOUND;  Surgeon: Shelah Lamar RAMAN, MD;  Location: [facility] ENDOS[facility];  Service:  Cardiopulmonary;  Laterality: Right;    REVIEW OF SYSTEMS:   Review of Systems  Constitutional: Positive for stable fatigue. Negative for appetite change, chills, fever and unexpected weight change.  HENT: Negative for mouth sores, nosebleeds, sore throat and trouble swallowing.   Eyes: Negative for eye problems and icterus.  Respiratory: Positive for dyspnea on exertion and intermittent cough.  Negative for hemoptysis and wheezing.     Cardiovascular: Negative for chest pain and leg swelling. Gastrointestinal: Negative for abdominal pain, constipation, diarrhea, nausea and vomiting.  Genitourinary: Negative for bladder incontinence, difficulty urinating, dysuria, frequency and hematuria.   Musculoskeletal: Positive for cancer related bone pain. Negative for gait problem, neck pain and neck stiffness.  Skin: Negative for itching and rash.  Neurological: Negative for dizziness, extremity weakness, gait problem, headaches, light-headedness and seizures.  Hematological: Negative for adenopathy. Does not bruise/bleed easily.  Psychiatric/Behavioral: Negative for confusion, depression and sleep disturbance. The patient is not nervous/anxious.   PHYSICAL EXAMINATION:  Blood pressure 121/70, pulse 77, temperature 98.8 F (37.1 C), temperature source Temporal, resp. rate 18, height 5' 6 (1.676 m), weight 202 lb (91.6 kg), SpO2 93%.  ECOG PERFORMANCE STATUS: 1  Physical Exam  Constitutional: Oriented to person, place, and time and well-developed, well-nourished, and in no distress.  HENT:  Head: Normocephalic and atraumatic.  Mouth/Throat: Oropharynx is clear and moist. No oropharyngeal exudate.  Eyes: Conjunctivae are normal. Right eye exhibits no discharge. Left eye exhibits no discharge. No scleral icterus.  Neck: Normal range of motion. Neck supple.  Cardiovascular: Normal rate, regular rhythm, normal heart sounds and intact distal pulses.   Pulmonary/Chest: Effort normal. Quiet breath  sounds bilaterally. No respiratory distress. No wheezes. No rales.  Abdominal: Soft. Bowel sounds are normal. Exhibits no distension and no mass. There is no tenderness.  Musculoskeletal: Normal range of motion. Wearing compression stockings Lymphadenopathy:    No cervical adenopathy.  Neurological: Alert and oriented to person, place, and time. Exhibits muscle wasting. Examined in the wheelchair.  Skin: Skin is warm and dry. No rash noted. Not diaphoretic. No erythema. No pallor.  Psychiatric: Mood, memory and judgment normal.  Vitals reviewed.  LABORATORY DATA: Lab Results  Component Value Date  WBC 5.6 07/24/2024   HGB 11.0 (L) 07/24/2024   HCT 36.2 (L) 07/24/2024   MCV 94.5 07/24/2024   PLT 131 (L) 07/24/2024      Chemistry      Component Value Date/Time   NA 143 07/24/2024 1358   K 4.3 07/24/2024 1358   CL 101 07/24/2024 1358   CO2 40 (H) 07/24/2024 1358   BUN 16 07/24/2024 1358   CREATININE 0.94 07/24/2024 1358      Component Value Date/Time   CALCIUM  8.7 (L) 07/24/2024 1358   ALKPHOS 59 07/24/2024 1358   AST 11 (L) 07/24/2024 1358   ALT 8 07/24/2024 1358   BILITOT 0.5 07/24/2024 1358       RADIOGRAPHIC STUDIES:  DG Chest Port 1 View Result Date: 07/13/2024 EXAM: 1 VIEW XRAY OF THE CHEST 07/13/2024 04:51:00 PM COMPARISON: None available. CLINICAL HISTORY: SOB. Pt having increased SHOB, cough for 6 days. Pt just finished prednisone . FINDINGS: LINES, TUBES AND DEVICES: Right chest wall Port-A-Cath in place with tip overlying the superior cavoatrial junction. LUNGS AND PLEURA: No focal pulmonary opacity. No pulmonary edema. No pleural effusion. No pneumothorax. HEART AND MEDIASTINUM: No acute abnormality of the cardiac and mediastinal silhouettes. Atherosclerotic plaque. BONES AND SOFT TISSUES: Old healed right rib fractures. IMPRESSION: 1. No acute cardiopulmonary process. 2. Right chest wall Port-A-Cath with tip at the superior cavoatrial junction, appropriately  positioned. 3. Old healed right rib fractures. Electronically signed by: Norleen Boxer MD 07/13/2024 05:16 PM EDT RP Workstation: [id number]   CT CHEST ABDOMEN PELVIS W CONTRAST Result Date: 07/10/2024 CLINICAL DATA:  Non-small-cell lung cancer restaging * Tracking Code: BO * EXAM: CT CHEST, ABDOMEN, AND PELVIS WITH CONTRAST TECHNIQUE: Multidetector CT imaging of the chest, abdomen and pelvis was performed following the standard protocol during bolus administration of intravenous contrast. RADIATION DOSE REDUCTION: This exam was performed according to the departmental dose-optimization program which includes automated exposure control, adjustment of the mA and/or kV according to patient size and/or use of iterative reconstruction technique. CONTRAST:  OMNIPAQUE  IOHEXOL  300 MG/ML  SOLN COMPARISON:  04/16/2024 FINDINGS: CT CHEST FINDINGS Cardiovascular: Right chest port catheter. Aortic atherosclerosis. Normal heart size. Three-vessel coronary artery calcifications. No pericardial effusion. Mediastinum/Nodes: No enlarged mediastinal, hilar, or axillary lymph nodes. Thyroid  gland, trachea, and esophagus demonstrate no significant findings. Lungs/Pleura: Unchanged treated mass of the suprahilar right upper lobe measuring 3.0 x 2.5 cm (series, image 49). Diminished conspicuity of a previously described subpleural opacity in the peripheral right apex (series 4, image 30). Moderate underlying centrilobular and paraseptal emphysema. Diffuse bilateral bronchial wall thickening. Small bilateral pulmonary nodules unchanged, for example a 0.5 cm subpleural nodule of the peripheral right lower lobe (series 4, image 114) and a 0.5 cm nodule in the anterior left lower lobe (series 4, image 115). No pleural effusion or pneumothorax. Musculoskeletal: Bilateral gynecomastia.  No acute osseous findings. CT ABDOMEN PELVIS FINDINGS Hepatobiliary: No solid liver abnormality is seen. No gallstones, gallbladder wall thickening, or  biliary dilatation. Pancreas: Unremarkable. No pancreatic ductal dilatation or surrounding inflammatory changes. Spleen: Mild splenomegaly, maximum span 14.0 cm. Adrenals/Urinary Tract: Adrenal glands are unremarkable. Kidneys are normal, without renal calculi, solid lesion, or hydronephrosis. Bladder is unremarkable. Stomach/Bowel: Stomach is within normal limits. Appendix appears normal. No evidence of bowel wall thickening, distention, or inflammatory changes. Vascular/Lymphatic: Severe aortic atherosclerosis. No enlarged abdominal or pelvic lymph nodes. Reproductive: Prostatomegaly. Other: No abdominal wall hernia or abnormality. No ascites. Musculoskeletal: No acute osseous findings. Unchanged predominantly sclerotic osseous metastatic disease  involving the axial skeleton notable for chronic pathologic fracture of the manubrium and pathologic wedge deformity of the T8 vertebral body (series 6, image 123). Multiple chronic callused rib fractures. IMPRESSION: 1. Unchanged treated mass of the suprahilar right upper lobe. 2. Diminished conspicuity of a previously described subpleural opacity in the peripheral right apex, consistent with resolving infection or inflammation. 3. Small bilateral pulmonary nodules unchanged. 4. Unchanged predominantly sclerotic osseous metastatic disease involving the axial skeleton notable for chronic pathologic fracture of the manubrium and pathologic wedge deformity of the T8 vertebral body. 5. Emphysema and diffuse bilateral bronchial wall thickening. 6. Coronary artery disease. Aortic Atherosclerosis (ICD10-I70.0) and Emphysema (ICD10-J43.9). Electronically Signed   By: Marolyn JONETTA Jaksch M.D.   On: 07/10/2024 07:46     ASSESSMENT/PLAN:  This is a very pleasant 58 years old Caucasian male with stage IV (T3, N2, M1 C) non-small cell lung cancer, adenocarcinoma presented with large right upper lobe lung mass in addition to right hilar and subcarinal lymphadenopathy in addition to  multiple metastatic bone lesions involving the pelvis as well as the lower thoracic and lumbar spines in addition to metastatic disease to the pelvic muscles diagnosed in February 2024.  Molecular studies showed positive KRAS G12C mutation and PD-L1 expression of 98%.   The patient is here today to start the first cycle of systemic chemotherapy with carboplatin  for AUC of 5, Alimta  500 Mg/M2 and Keytruda  200 Mg IV every 3 weeks.  First dose November 14, 2022.  Status post 29 cycles.  Starting from cycle #5 the patient is on maintenance treatment with Alimta  and Keytruda  every 3 weeks.    He received radiation to the iliac spine on 12/25/2023  The patient was seen with Dr. Sherrod today.  Dr. Sherrod personally and independently reviewed the scan and discussed results with the patient today.  The scan showed no evidence of disease progression.  Dr. Sherrod recommends he continue on treatment at the same dose.  Labs were reviewed. Recommend that he proceed with cycle #28 today as scheduled.    We will see him back for follow-up visit in 3 weeks for evaluation repeat blood work before undergoing cycle #29.   Will continue on his blood thinner for his history of DVT.   He will continue to follow with palliative care for his cancer related pain. He is scheduled to see them today.   The patient was advised to call immediately if she has any concerning symptoms in the interval. The patient voices understanding of current disease status and treatment options and is in agreement with the current care plan. All questions were answered. The patient knows to call the clinic with any problems, questions or concerns. We can certainly see the patient much sooner if necessary   Orders Placed This Encounter  Procedures   CBC with Differential (Cancer Center Only)    Standing Status:   Future    Expected Date:   11/06/2024    Expiration Date:   11/06/2025   CMP (Cancer Center only)    Standing Status:    Future    Expected Date:   11/06/2024    Expiration Date:   11/06/2025       Donelle Hise L Vida Nicol, PA-C 07/24/24  ADDENDUM: Hematology/oncology Attending: I had a face-to-face encounter with the patient today.  I reviewed his record, lab, scan and recommended his care plan.  This is a very pleasant 58 years old white male with a stage IV non-small cell lung cancer, adenocarcinoma  with positive KRAS G12C mutation and PD-L1 expression of 98%.  He started palliative systemic chemoimmunotherapy initially with carboplatin , Alimta  and Keytruda  every 3 weeks for 4 cycles and starting from cycle #5 he has been on maintenance treatment with Alimta  and Keytruda  every 3 weeks status post a total of 29 cycles of treatment.  The patient has been tolerating his treatment well but he was recently admitted to the hospital with suspicious pneumonia and shortness of breath. He is feeling much better now. He had repeat CT scan of the chest, abdomen and pelvis performed recently.  I personally independently reviewed the scan and discussed the result with the patient and his sister.  His scan showed no concerning findings for disease progression. I recommended for the patient to continue his current treatment with maintenance Alimta  and Keytruda  and he will proceed with cycle #13 today. He will come back for follow-up visit in 3 weeks for evaluation before the next cycle of his treatment. The patient was advised to call immediately if he has any other concerning symptoms in the interval. The total time spent in the appointment was 30 minutes including review of chart and various tests results, discussions about plan of care and coordination of care plan . Disclaimer: This note was dictated with voice recognition software. Similar sounding words can inadvertently be transcribed and may be missed upon review. Sherrod MARLA Sherrod, MD

## 2024-07-23 ENCOUNTER — Encounter: Payer: Self-pay | Admitting: Internal Medicine

## 2024-07-23 ENCOUNTER — Other Ambulatory Visit: Payer: Self-pay

## 2024-07-23 ENCOUNTER — Other Ambulatory Visit (HOSPITAL_COMMUNITY): Payer: Self-pay

## 2024-07-23 ENCOUNTER — Other Ambulatory Visit: Payer: Self-pay | Admitting: Physician Assistant

## 2024-07-23 ENCOUNTER — Other Ambulatory Visit: Payer: Self-pay | Admitting: Nurse Practitioner

## 2024-07-23 DIAGNOSIS — C3490 Malignant neoplasm of unspecified part of unspecified bronchus or lung: Secondary | ICD-10-CM

## 2024-07-23 DIAGNOSIS — M25552 Pain in left hip: Secondary | ICD-10-CM

## 2024-07-23 DIAGNOSIS — Z515 Encounter for palliative care: Secondary | ICD-10-CM

## 2024-07-23 MED ORDER — MELOXICAM 7.5 MG PO TABS
7.5000 mg | ORAL_TABLET | Freq: Two times a day (BID) | ORAL | 3 refills | Status: AC
  Filled 2024-07-23: qty 60, 30d supply, fill #0
  Filled 2024-08-23: qty 60, 30d supply, fill #1
  Filled 2024-09-23: qty 60, 30d supply, fill #2
  Filled 2024-10-23: qty 60, 30d supply, fill #3

## 2024-07-24 ENCOUNTER — Inpatient Hospital Stay: Attending: Internal Medicine

## 2024-07-24 ENCOUNTER — Other Ambulatory Visit: Payer: Self-pay | Admitting: Physician Assistant

## 2024-07-24 ENCOUNTER — Inpatient Hospital Stay

## 2024-07-24 ENCOUNTER — Inpatient Hospital Stay (HOSPITAL_BASED_OUTPATIENT_CLINIC_OR_DEPARTMENT_OTHER): Admitting: Nurse Practitioner

## 2024-07-24 ENCOUNTER — Encounter: Payer: Self-pay | Admitting: Internal Medicine

## 2024-07-24 ENCOUNTER — Encounter: Payer: Self-pay | Admitting: Nurse Practitioner

## 2024-07-24 ENCOUNTER — Inpatient Hospital Stay: Admitting: Physician Assistant

## 2024-07-24 VITALS — BP 121/70 | HR 77 | Temp 98.8°F | Resp 18 | Ht 66.0 in | Wt 202.0 lb

## 2024-07-24 DIAGNOSIS — Z888 Allergy status to other drugs, medicaments and biological substances status: Secondary | ICD-10-CM | POA: Insufficient documentation

## 2024-07-24 DIAGNOSIS — C7951 Secondary malignant neoplasm of bone: Secondary | ICD-10-CM | POA: Insufficient documentation

## 2024-07-24 DIAGNOSIS — I2510 Atherosclerotic heart disease of native coronary artery without angina pectoris: Secondary | ICD-10-CM | POA: Diagnosis not present

## 2024-07-24 DIAGNOSIS — J441 Chronic obstructive pulmonary disease with (acute) exacerbation: Secondary | ICD-10-CM | POA: Diagnosis not present

## 2024-07-24 DIAGNOSIS — E1169 Type 2 diabetes mellitus with other specified complication: Secondary | ICD-10-CM | POA: Insufficient documentation

## 2024-07-24 DIAGNOSIS — R530 Neoplastic (malignant) related fatigue: Secondary | ICD-10-CM

## 2024-07-24 DIAGNOSIS — Z79620 Long term (current) use of immunosuppressive biologic: Secondary | ICD-10-CM | POA: Insufficient documentation

## 2024-07-24 DIAGNOSIS — Z5112 Encounter for antineoplastic immunotherapy: Secondary | ICD-10-CM | POA: Insufficient documentation

## 2024-07-24 DIAGNOSIS — F1290 Cannabis use, unspecified, uncomplicated: Secondary | ICD-10-CM | POA: Diagnosis not present

## 2024-07-24 DIAGNOSIS — S2221XA Fracture of manubrium, initial encounter for closed fracture: Secondary | ICD-10-CM | POA: Diagnosis not present

## 2024-07-24 DIAGNOSIS — Z79899 Other long term (current) drug therapy: Secondary | ICD-10-CM | POA: Insufficient documentation

## 2024-07-24 DIAGNOSIS — Z95828 Presence of other vascular implants and grafts: Secondary | ICD-10-CM

## 2024-07-24 DIAGNOSIS — I700 Atherosclerosis of aorta: Secondary | ICD-10-CM | POA: Insufficient documentation

## 2024-07-24 DIAGNOSIS — C3490 Malignant neoplasm of unspecified part of unspecified bronchus or lung: Secondary | ICD-10-CM

## 2024-07-24 DIAGNOSIS — N400 Enlarged prostate without lower urinary tract symptoms: Secondary | ICD-10-CM | POA: Insufficient documentation

## 2024-07-24 DIAGNOSIS — C3411 Malignant neoplasm of upper lobe, right bronchus or lung: Secondary | ICD-10-CM | POA: Diagnosis present

## 2024-07-24 DIAGNOSIS — G893 Neoplasm related pain (acute) (chronic): Secondary | ICD-10-CM | POA: Insufficient documentation

## 2024-07-24 DIAGNOSIS — J440 Chronic obstructive pulmonary disease with acute lower respiratory infection: Secondary | ICD-10-CM | POA: Diagnosis not present

## 2024-07-24 DIAGNOSIS — Z886 Allergy status to analgesic agent status: Secondary | ICD-10-CM | POA: Insufficient documentation

## 2024-07-24 DIAGNOSIS — F17210 Nicotine dependence, cigarettes, uncomplicated: Secondary | ICD-10-CM | POA: Insufficient documentation

## 2024-07-24 DIAGNOSIS — E785 Hyperlipidemia, unspecified: Secondary | ICD-10-CM | POA: Diagnosis not present

## 2024-07-24 DIAGNOSIS — Z86718 Personal history of other venous thrombosis and embolism: Secondary | ICD-10-CM | POA: Insufficient documentation

## 2024-07-24 DIAGNOSIS — Z79631 Long term (current) use of antimetabolite agent: Secondary | ICD-10-CM | POA: Insufficient documentation

## 2024-07-24 DIAGNOSIS — J432 Centrilobular emphysema: Secondary | ICD-10-CM | POA: Diagnosis not present

## 2024-07-24 DIAGNOSIS — J438 Other emphysema: Secondary | ICD-10-CM | POA: Insufficient documentation

## 2024-07-24 DIAGNOSIS — Z7901 Long term (current) use of anticoagulants: Secondary | ICD-10-CM | POA: Insufficient documentation

## 2024-07-24 DIAGNOSIS — Z515 Encounter for palliative care: Secondary | ICD-10-CM | POA: Diagnosis not present

## 2024-07-24 DIAGNOSIS — N62 Hypertrophy of breast: Secondary | ICD-10-CM | POA: Diagnosis not present

## 2024-07-24 DIAGNOSIS — I110 Hypertensive heart disease with heart failure: Secondary | ICD-10-CM | POA: Insufficient documentation

## 2024-07-24 DIAGNOSIS — Z880 Allergy status to penicillin: Secondary | ICD-10-CM | POA: Insufficient documentation

## 2024-07-24 DIAGNOSIS — Z5111 Encounter for antineoplastic chemotherapy: Secondary | ICD-10-CM

## 2024-07-24 DIAGNOSIS — Z923 Personal history of irradiation: Secondary | ICD-10-CM | POA: Insufficient documentation

## 2024-07-24 DIAGNOSIS — C7800 Secondary malignant neoplasm of unspecified lung: Secondary | ICD-10-CM | POA: Diagnosis not present

## 2024-07-24 LAB — CMP (CANCER CENTER ONLY)
ALT: 8 U/L (ref 0–44)
AST: 11 U/L — ABNORMAL LOW (ref 15–41)
Albumin: 3.4 g/dL — ABNORMAL LOW (ref 3.5–5.0)
Alkaline Phosphatase: 59 U/L (ref 38–126)
Anion gap: 2 — ABNORMAL LOW (ref 5–15)
BUN: 16 mg/dL (ref 6–20)
CO2: 40 mmol/L — ABNORMAL HIGH (ref 22–32)
Calcium: 8.7 mg/dL — ABNORMAL LOW (ref 8.9–10.3)
Chloride: 101 mmol/L (ref 98–111)
Creatinine: 0.94 mg/dL (ref 0.61–1.24)
GFR, Estimated: >60 mL/min (ref >60)
Glucose, Bld: 124 mg/dL — ABNORMAL HIGH (ref 70–99)
Potassium: 4.3 mmol/L (ref 3.5–5.1)
Sodium: 143 mmol/L (ref 135–145)
Total Bilirubin: 0.5 mg/dL (ref 0.0–1.2)
Total Protein: 6.4 g/dL — ABNORMAL LOW (ref 6.5–8.1)

## 2024-07-24 LAB — CBC WITH DIFFERENTIAL (CANCER CENTER ONLY)
Abs Immature Granulocytes: 0.02 K/uL (ref 0.00–0.07)
Basophils Absolute: 0.1 K/uL (ref 0.0–0.1)
Basophils Relative: 1 %
Eosinophils Absolute: 0.3 K/uL (ref 0.0–0.5)
Eosinophils Relative: 5 %
HCT: 36.2 % — ABNORMAL LOW (ref 39.0–52.0)
Hemoglobin: 11 g/dL — ABNORMAL LOW (ref 13.0–17.0)
Immature Granulocytes: 0 %
Lymphocytes Relative: 11 %
Lymphs Abs: 0.6 K/uL — ABNORMAL LOW (ref 0.7–4.0)
MCH: 28.7 pg (ref 26.0–34.0)
MCHC: 30.4 g/dL (ref 30.0–36.0)
MCV: 94.5 fL (ref 80.0–100.0)
Monocytes Absolute: 0.6 K/uL (ref 0.1–1.0)
Monocytes Relative: 10 %
Neutro Abs: 4 K/uL (ref 1.7–7.7)
Neutrophils Relative %: 73 %
Platelet Count: 131 K/uL — ABNORMAL LOW (ref 150–400)
RBC: 3.83 MIL/uL — ABNORMAL LOW (ref 4.22–5.81)
RDW: 15.4 % (ref 11.5–15.5)
WBC Count: 5.6 K/uL (ref 4.0–10.5)
nRBC: 0 % (ref 0.0–0.2)

## 2024-07-24 LAB — TSH: TSH: 1.42 u[IU]/mL (ref 0.350–4.500)

## 2024-07-24 MED ORDER — SODIUM CHLORIDE 0.9 % IV SOLN
500.0000 mg/m2 | Freq: Once | INTRAVENOUS | Status: AC
  Administered 2024-07-24: 1000 mg via INTRAVENOUS
  Filled 2024-07-24: qty 40

## 2024-07-24 MED ORDER — ZOLEDRONIC ACID 4 MG/100ML IV SOLN
4.0000 mg | Freq: Once | INTRAVENOUS | Status: AC
  Administered 2024-07-24: 4 mg via INTRAVENOUS
  Filled 2024-07-24: qty 100

## 2024-07-24 MED ORDER — PROCHLORPERAZINE MALEATE 10 MG PO TABS
10.0000 mg | ORAL_TABLET | Freq: Once | ORAL | Status: AC
  Administered 2024-07-24: 10 mg via ORAL
  Filled 2024-07-24: qty 1

## 2024-07-24 MED ORDER — SODIUM CHLORIDE 0.9 % IV SOLN: Freq: Once | INTRAVENOUS | Status: AC

## 2024-07-24 MED ORDER — SODIUM CHLORIDE 0.9 % IV SOLN: INTRAVENOUS | Status: DC

## 2024-07-24 MED ORDER — SODIUM CHLORIDE 0.9 % IV SOLN
200.0000 mg | Freq: Once | INTRAVENOUS | Status: AC
  Administered 2024-07-24: 200 mg via INTRAVENOUS
  Filled 2024-07-24: qty 200

## 2024-07-24 MED ORDER — CYANOCOBALAMIN 1000 MCG/ML IJ SOLN
1000.0000 ug | Freq: Once | INTRAMUSCULAR | Status: AC
  Administered 2024-07-24: 1000 ug via INTRAMUSCULAR
  Filled 2024-07-24: qty 1

## 2024-07-24 NOTE — Patient Instructions (Signed)
 [facility] - A DEPT OF [facility]  Discharge Instructions: Thank you for choosing [facility] to provide your oncology and hematology care.   If you have a lab appointment with the Cancer Center, please go directly to the Cancer Center and check in at the registration area.   Wear comfortable clothing and clothing appropriate for easy access to any Portacath or PICC line.   We strive to give you quality time with your provider. You may need to reschedule your appointment if you arrive late (15 or more minutes).  Arriving late affects you and other patients whose appointments are after yours.  Also, if you miss three or more appointments without notifying the office, you may be dismissed from the clinic at the provider's discretion.      For prescription refill requests, have your pharmacy contact our office and allow 72 hours for refills to be completed.    Today you received the following chemotherapy and/or immunotherapy agents: Keytruda , Alimta , Zometa       To help prevent nausea and vomiting after your treatment, we encourage you to take your nausea medication as directed.  BELOW ARE SYMPTOMS THAT SHOULD BE REPORTED IMMEDIATELY: *FEVER GREATER THAN 100.4 F (38 C) OR HIGHER *CHILLS OR SWEATING *NAUSEA AND VOMITING THAT IS NOT CONTROLLED WITH YOUR NAUSEA MEDICATION *UNUSUAL SHORTNESS OF BREATH *UNUSUAL BRUISING OR BLEEDING *URINARY PROBLEMS (pain or burning when urinating, or frequent urination) *BOWEL PROBLEMS (unusual diarrhea, constipation, pain near the anus) TENDERNESS IN MOUTH AND THROAT WITH OR WITHOUT PRESENCE OF ULCERS (sore throat, sores in mouth, or a toothache) UNUSUAL RASH, SWELLING OR PAIN  UNUSUAL VAGINAL DISCHARGE OR ITCHING   Items with * indicate a potential emergency and should be followed up as soon as possible or go to the Emergency Department if any problems should occur.  Please show the CHEMOTHERAPY ALERT CARD or  IMMUNOTHERAPY ALERT CARD at check-in to the Emergency Department and triage nurse.  Should you have questions after your visit or need to cancel or reschedule your appointment, please contact [facility] - A DEPT [facility]  Dept: [phone]  and follow the prompts.  Office hours are 8:00 a.m. to 4:30 p.m. Monday - Friday. Please note that voicemails left after 4:00 p.m. may not be returned until the following business day.  We are closed weekends and major holidays. You have access to a nurse at all times for urgent questions. Please call the main number to the clinic Dept: [phone] and follow the prompts.   For any non-urgent questions, you may also contact your provider using MyChart. We now offer e-Visits for anyone 50 and older to request care online for non-urgent symptoms. For details visit [email or web address].   Also download the MyChart app! Go to the app store, search MyChart, open the app, select [facility], and log in with your MyChart username and password.

## 2024-07-24 NOTE — Progress Notes (Signed)
 Palliative Medicine [facility]  [phone] [phone]   Name: Maxwell Grant Date: 07/24/2024 MRN: [id number]  DOB: [birth year]  Patient Care Team: Pura Lenis, MD as PCP - General (Family Medicine)    INTERVAL HISTORY: Maxwell Grant is a 58 y.o. male with oncologic medical history including new diagnosed non-small cell lung cancer (10/2022), as well as a history of COPD, diabetes mellitus, hypertension, dyslipidemia as well as history of osteomyelitis of the back.  Palliative ask to see for symptom and pain management and goals of care.   SOCIAL HISTORY:     reports that he has been smoking cigarettes. He has never used smokeless tobacco. He reports current alcohol use of about 1.0 standard drink of alcohol per week. He reports current drug use. Drug: Marijuana.  ADVANCE DIRECTIVES:  None on file   CODE STATUS: Full code  PAST MEDICAL HISTORY: Past Medical History:  Diagnosis Date   CHF (congestive heart failure) (HCC)    COPD (chronic obstructive pulmonary disease) (HCC)    Diabetes mellitus without complication (HCC)    Dyspnea    Hypertension    Lung cancer (HCC) 10/2022   Right leg DVT (HCC) 12/21/2022    ALLERGIES:  is allergic to statins, aspirin, flexeril [cyclobenzaprine], and penicillins.  MEDICATIONS:  Current Outpatient Medications  Medication Sig Dispense Refill   Accu-Chek FastClix Lancets MISC Use to test blood sugar 4 times a day 102 each 0   albuterol  (PROVENTIL ) (2.5 MG/3ML) 0.083% nebulizer solution Inhale 3 mL (2.5 mg total) by nebulization every 6 (six) hours as needed for wheezing. 360 mL 3   albuterol  (VENTOLIN  HFA) 108 (90 Base) MCG/ACT inhaler Inhale 1-2 puffs into the lungs every 6 (six) hours as needed for wheezing or shortness of breath. 18 g 1   albuterol  (VENTOLIN  HFA) 108 (90 Base) MCG/ACT inhaler Inhale 2 puffs every 6 (six) hours as needed for wheezing. 54 g 3   apixaban  (ELIQUIS ) 5 MG TABS tablet  Take 1 tablet (5 mg total) by mouth 2 (two) times daily. 60 tablet 5   Azelastine  HCl 137 MCG/SPRAY SOLN Place 2 sprays into nostrils 2 (two) times daily. 30 mL 12   azithromycin  (ZITHROMAX ) 250 MG tablet Take 2 tablets (500 mg total) by mouth on day 1 THEN take 1 tablet (250 mg total) daily for 4 days. 6 tablet 0   benzonatate  (TESSALON ) 100 MG capsule Take 1 capsule (100 mg total) by mouth 3 (three) times daily as needed. 30 capsule 2   fluticasone  (FLONASE ) 50 MCG/ACT nasal spray Place 1 spray into both nostrils daily. 16 g 2   Fluticasone -Umeclidin-Vilant (TRELEGY ELLIPTA ) 100-62.5-25 MCG/ACT AEPB Inhale 1 puff into the lungs daily in the afternoon. 60 each 5   folic acid  (FOLVITE ) 1 MG tablet Take 1 tablet (1 mg total) by mouth daily. 90 tablet 1   furosemide  (LASIX ) 20 MG tablet Take 1 tablet (20 mg total) by mouth daily for 1 week. 7 tablet 0   gabapentin  (NEURONTIN ) 300 MG capsule Take 2 capsules (600mg ) in the morning, 1 capsule (300 mg ) midday, and 2 capsules at bedtime (600mg ). 150 capsule 2   glimepiride  (AMARYL ) 2 MG tablet Take 2 mg by mouth in the morning.     glimepiride  (AMARYL ) 2 MG tablet Take one tablet (2 mg dose) by mouth 30 (thirty) minutes before breakfast. 90 tablet 1   lidocaine -prilocaine  (EMLA ) cream Apply to [facility] Prior to Access as needed 30 g 6  meloxicam  (MOBIC ) 7.5 MG tablet Take 1 tablet (7.5 mg total) by mouth 2 (two) times daily. 60 tablet 3   ondansetron  (ZOFRAN ) 8 MG tablet Take 1 tablet (8 mg) by mouth every 8 hours as needed for nausea or vomiting. 30 tablet 2   OVER THE COUNTER MEDICATION Take 1 tablet by mouth daily as needed (constipation).  Stool softener     oxyCODONE  (OXYCONTIN ) 20 mg 12 hr tablet Take 1 tablet (20 mg total) by mouth every 8 (eight) hours. 90 tablet 0   oxyCODONE -acetaminophen  (PERCOCET) 7.5-325 MG tablet Take 1 tablet by mouth every 6 (six) hours as needed for severe pain (pain score 7-10). 60 tablet 0   predniSONE  (DELTASONE ) 10 MG  tablet Take 3 tablets (30 mg total) by mouth daily for 2 days, THEN 2 tablets (20 mg total) daily for 2 days, THEN 1 tablet (10 mg total) daily for 2 days. (Patient not taking: Reported on 07/24/2024) 12 tablet 0   predniSONE  (DELTASONE ) 20 MG tablet Take 2 tablets (40 mg total) by mouth daily with breakfast. For the next four days (Patient not taking: Reported on 07/24/2024) 8 tablet 0   prochlorperazine  (COMPAZINE ) 10 MG tablet Take 1 tablet (10 mg total) by mouth every 6 (six) hours as needed for nausea or vomiting. 30 tablet 0   TRELEGY ELLIPTA  100-62.5-25 MCG/ACT AEPB Inhale 1 puff once daily. 180 each 3   No current facility-administered medications for this visit.   Facility-Administered Medications Ordered in Other Visits  Medication Dose Route Frequency Provider Last Rate Last Admin   0.9 %  sodium chloride  infusion   Intravenous Continuous Sherrod Sherrod, MD        VITAL SIGNS: There were no vitals taken for this visit. There were no vitals filed for this visit.  Estimated body mass index is 32.6 kg/m as calculated from the following:   Height as of an earlier encounter on 07/24/24: 5' 6 (1.676 m).   Weight as of an earlier encounter on 07/24/24: 202 lb (91.6 kg).  PERFORMANCE STATUS (ECOG) : 1 - Symptomatic but completely ambulatory  Physical Exam General: NAD Cardiovascular: regular rate and rhythm Pulmonary: normal breathing pattern Extremities: no edema, no joint deformities Skin: no rashes Neurological: AAO x3  IMPRESSION: Discussed the use of AI scribe software for clinical note transcription with the patient, who gave verbal consent to proceed.  History of Present Illness Maxwell Grant is a 58 year old male who was seen during infusion for follow-up. Accompanied by family. No acute distress. Denies concerns for nausea, vomiting, constipation, or diarrhea. Is remaining as active as possible.  He is recovering from recent episode of pneumonia last week and  continues to have a persistent cough. His energy levels have been low since the pneumonia but does feel they are slowly improving.  Mr. Province reports good appetite. Some days are better than others. Weight today is up to 202 pounds.  Continues to focus on small frequent meals and pushing protein intake.   We discussed his pain regimen which consists of OxyContin  every eight hours and Percocet as needed for severe pain, sometimes up to twice a day. He also takes gabapentin , two capsules in the morning and two at night. Pain is effectively managed at this time. No adjustments needed. We will continue to closely monitor and support.   All questions answered and support provided.   Assessment & Plan  Follow-Up Scheduled follow-up for infusion therapy. - Will see patient back in  collaboration with scheduled appointments in 3-4 weeks. Sooner if needed.   Cancer pain Pain is well-managed with Oxycontin . He reports good energy levels, sleep, and appetite. He has gained two pounds and wishes to manage his weight. - Ensure prior approval for Oxycontin  refill if needed. - OxyContin  to every 8 hours. - Continue Percocet as needed for severe pain, every 4 to 6 hours.  Pneumonia, recovering Recovering from pneumonia with persistent cough and low energy levels. Appetite has improved with some weight gain. - Continue current medications as prescribed.  Post-infectious cough and fatigue Persistent cough and fatigue following pneumonia. Cough is lingering post-infection. Energy levels are improving.  Patient expressed understanding and was in agreement with this plan. He also understands that He can call the clinic at any time with any questions, concerns, or complaints.   Any controlled substances utilized were prescribed in the context of palliative care. PDMP has been reviewed.   Visit consisted of counseling and education dealing with the complex and emotionally intense issues of symptom  management and palliative care in the setting of serious and potentially life-threatening illness.  Levon Borer, AGPCNP-BC  Palliative Medicine Team/[facility]

## 2024-07-25 ENCOUNTER — Encounter: Payer: Self-pay | Admitting: Internal Medicine

## 2024-07-25 ENCOUNTER — Telehealth: Payer: Self-pay | Admitting: Internal Medicine

## 2024-07-28 LAB — T4

## 2024-07-30 ENCOUNTER — Other Ambulatory Visit (HOSPITAL_COMMUNITY): Payer: Self-pay

## 2024-07-30 ENCOUNTER — Other Ambulatory Visit: Payer: Self-pay | Admitting: Nurse Practitioner

## 2024-07-30 DIAGNOSIS — Z515 Encounter for palliative care: Secondary | ICD-10-CM

## 2024-07-30 DIAGNOSIS — C3490 Malignant neoplasm of unspecified part of unspecified bronchus or lung: Secondary | ICD-10-CM

## 2024-07-30 DIAGNOSIS — G893 Neoplasm related pain (acute) (chronic): Secondary | ICD-10-CM

## 2024-07-31 ENCOUNTER — Other Ambulatory Visit (HOSPITAL_COMMUNITY): Payer: Self-pay

## 2024-07-31 ENCOUNTER — Other Ambulatory Visit: Payer: Self-pay

## 2024-07-31 MED ORDER — OXYCODONE HCL ER 20 MG PO T12A
20.0000 mg | EXTENDED_RELEASE_TABLET | Freq: Three times a day (TID) | ORAL | 0 refills | Status: DC
  Filled 2024-07-31: qty 90, 30d supply, fill #0

## 2024-08-06 NOTE — Progress Notes (Signed)
 [facility] OFFICE PROGRESS NOTE  Pura Lenis, MD [address] [place] [address]  DIAGNOSIS: 1) Stage IV (T3, N2, M1 C) non-small cell lung cancer, adenocarcinoma presented with large right upper lobe lung mass in addition to right hilar and subcarinal lymphadenopathy in addition to multiple metastatic bone lesions involving the pelvis as well as the lower thoracic and lumbar spines in addition to metastatic disease to the pelvic muscles diagnosed in February 2024.  2) deep venous thrombosis of the right lower extremity diagnosed on December 18, 2022   Detected Alteration(s) / Biomarker(s)         Associated FDA-approved therapies  Clinical Trial Availability% cfDNA or Amplification   KRAS G12C approved by FDA Adagrasib, Sotorasib Yes    29.3%   CDK4 Amplification None Yes            High (+++)Plasma Copy Number: 8.0   PD-L1 expression 98%  PRIOR THERAPY: Palliative radiotherapy to the painful bone metastasis under the care of Dr. Dewey    Radiation to the iliac spine on 12/25/2023  CURRENT THERAPY: 1) Systemic chemotherapy with carboplatin  for AUC of 5, Alimta  500 Mg/M2 and Keytruda  200 Mg IV every 3 weeks.  First dose November 14, 2022.  Status post 30 cycles.  Working from cycle #5 the patient has been on maintenance treatment with Alimta  and Keytruda  every 3 weeks. 2) Eliquis  10 mg p.o. twice daily for 7 days followed by 5 mg p.o. twice daily.  First dose 12/26/2022  INTERVAL HISTORY: Maxwell Grant 58 y.o. male returns to the clinic today for a follow-up visit accompanied by his sister. The patient was last seen by Dr. Sherrod 3 weeks ago.    In the interval since being seen, he was seen by his pulmonologist and treated for pneumonia.  He states he is feeling well at this time. He is feeling better with improvement in symptoms such as shortness of breath and cough. Initially, he experienced significant shortness of breath during the illness.   He  is currently on OxyContin  20 mg every 12 hours, oxycodone  7.5 mg 2x per day, MiraLAX, and Colace. He is currently on Eliquis  for history of DVT in the past. He follows wit palliative care and is expected to see them again at his next appointment.   He denies nausea, vomiting, or diarrhea.   No current fever, chills, night sweats, weight loss, headaches, vision changes, rashes, or hemoptysis.   He continues to smoke cigarettes. He averages 1 ppd. Denies any headache or visual changes. Denies any rashes or skin changes. He is compliant with his blood thinner for his history of DVT.  He is here today for evaluation and repeat blood work before undergoing cycle #31.   MEDICAL HISTORY: Past Medical History:  Diagnosis Date   CHF (congestive heart failure) (HCC)    COPD (chronic obstructive pulmonary disease) (HCC)    Diabetes mellitus without complication (HCC)    Dyspnea    Hypertension    Lung cancer (HCC) 10/2022   Right leg DVT (HCC) 12/21/2022    ALLERGIES:  is allergic to statins, aspirin, flexeril [cyclobenzaprine], and penicillins.  MEDICATIONS:  Current Outpatient Medications  Medication Sig Dispense Refill   OHTUVAYRE  3 MG/2.5ML SUSP Inhale 3 mg into the lungs 2 (two) times daily.     Accu-Chek FastClix Lancets MISC Use to test blood sugar 4 times a day 102 each 0   albuterol  (PROVENTIL ) (2.5 MG/3ML) 0.083% nebulizer solution Inhale 3  mL (2.5 mg total) by nebulization every 6 (six) hours as needed for wheezing. 360 mL 3   albuterol  (VENTOLIN  HFA) 108 (90 Base) MCG/ACT inhaler Inhale 1-2 puffs into the lungs every 6 (six) hours as needed for wheezing or shortness of breath. 18 g 1   albuterol  (VENTOLIN  HFA) 108 (90 Base) MCG/ACT inhaler Inhale 2 puffs every 6 (six) hours as needed for wheezing. 54 g 3   apixaban  (ELIQUIS ) 5 MG TABS tablet Take 1 tablet (5 mg total) by mouth 2 (two) times daily. 60 tablet 5   Azelastine  HCl 137 MCG/SPRAY SOLN Place 2 sprays into nostrils 2 (two)  times daily. 30 mL 12   benzonatate  (TESSALON ) 100 MG capsule Take 1 capsule (100 mg total) by mouth 3 (three) times daily as needed. 30 capsule 2   fluticasone  (FLONASE ) 50 MCG/ACT nasal spray Place 1 spray into both nostrils daily. 16 g 2   Fluticasone -Umeclidin-Vilant (TRELEGY ELLIPTA ) 100-62.5-25 MCG/ACT AEPB Inhale 1 puff into the lungs daily in the afternoon. 60 each 5   folic acid  (FOLVITE ) 1 MG tablet Take 1 tablet (1 mg total) by mouth daily. 90 tablet 1   furosemide  (LASIX ) 20 MG tablet Take 1 tablet (20 mg total) by mouth daily for 1 week. 7 tablet 0   gabapentin  (NEURONTIN ) 300 MG capsule Take 2 capsules (600mg ) in the morning, 1 capsule (300 mg ) midday, and 2 capsules at bedtime (600mg ). 150 capsule 2   glimepiride  (AMARYL ) 2 MG tablet Take 2 mg by mouth in the morning.     glimepiride  (AMARYL ) 2 MG tablet Take one tablet (2 mg dose) by mouth 30 (thirty) minutes before breakfast. 90 tablet 1   guaiFENesin  (MUCINEX ) 600 MG 12 hr tablet Take 1,200 mg by mouth 2 (two) times daily.     lidocaine -prilocaine  (EMLA ) cream Apply to [facility] Prior to Access as needed 30 g 6   meloxicam  (MOBIC ) 7.5 MG tablet Take 1 tablet (7.5 mg total) by mouth 2 (two) times daily. 60 tablet 3   ondansetron  (ZOFRAN ) 8 MG tablet Take 1 tablet (8 mg) by mouth every 8 hours as needed for nausea or vomiting. 30 tablet 2   OVER THE COUNTER MEDICATION Take 1 tablet by mouth daily as needed (constipation).  Stool softener     oxyCODONE  (OXYCONTIN ) 20 mg 12 hr tablet Take 1 tablet (20 mg total) by mouth every 8 (eight) hours. 90 tablet 0   oxyCODONE -acetaminophen  (PERCOCET) 7.5-325 MG tablet Take 1 tablet by mouth every 6 (six) hours as needed for severe pain (pain score 7-10). 60 tablet 0   prochlorperazine  (COMPAZINE ) 10 MG tablet Take 1 tablet (10 mg total) by mouth every 6 (six) hours as needed for nausea or vomiting. 30 tablet 0   TRELEGY ELLIPTA  100-62.5-25 MCG/ACT AEPB Inhale 1 puff once daily. 180 each 3   No  current facility-administered medications for this visit.    SURGICAL HISTORY:  Past Surgical History:  Procedure Laterality Date   BRONCHIAL NEEDLE ASPIRATION BIOPSY  10/30/2022   Procedure: BRONCHIAL NEEDLE ASPIRATION BIOPSIES;  Surgeon: Shelah Lamar RAMAN, MD;  Location: [facility] ENDOSCOPY;  Service: Cardiopulmonary;;   IR IMAGING GUIDED PORT INSERTION  11/17/2022   NO PAST SURGERIES     RADIOLOGY WITH ANESTHESIA  10/30/2022   Procedure: MRI WITH ANESTHESIA W/WO CONTRAST;  Surgeon: Shelah Lamar RAMAN, MD;  Location[facility] ENDOSCOPY;  Service: Cardiopulmonary;;   VIDEO BRONCHOSCOPY WITH ENDOBRONCHIAL ULTRASOUND Right 10/30/2022   Procedure: VIDEO BRONCHOSCOPY WITH ENDOBRONCHIAL ULTRASOUND;  Surgeon: Shelah Lamar RAMAN, MD;  Location: [facility] ENDOSCOPY;  Service: Cardiopulmonary;  Laterality: Right;    REVIEW OF SYSTEMS:   Review of Systems  Constitutional: Positive for stable fatigue. Negative for appetite change, chills, fever and unexpected weight change.  HENT: Negative for mouth sores, nosebleeds, sore throat and trouble swallowing.   Eyes: Negative for eye problems and icterus.  Respiratory: Positive for baseline dyspnea on exertion and intermittent cough.  Negative for hemoptysis and wheezing.     Cardiovascular: Negative for chest pain and leg swelling. Gastrointestinal: Negative for abdominal pain, constipation, diarrhea, nausea and vomiting.  Genitourinary: Negative for bladder incontinence, difficulty urinating, dysuria, frequency and hematuria.   Musculoskeletal: Positive for cancer related bone pain. Negative for gait problem, neck pain and neck stiffness.  Skin: Negative for itching and rash.  Neurological: Negative for dizziness, extremity weakness, gait problem, headaches, light-headedness and seizures.  Hematological: Negative for adenopathy. Does not bruise/bleed easily.  Psychiatric/Behavioral: Negative for confusion, depression and sleep disturbance. The patient is not  nervous/anxious.   PHYSICAL EXAMINATION:  Blood pressure 132/60, pulse 72, temperature 98.7 F (37.1 C), temperature source Temporal, resp. rate 18, weight 198 lb 3.2 oz (89.9 kg), SpO2 90%.  ECOG PERFORMANCE STATUS: 1  Physical Exam  Constitutional: Oriented to person, place, and time and well-developed, well-nourished, and in no distress.  HENT:  Head: Normocephalic and atraumatic.  Mouth/Throat: Oropharynx is clear and moist. No oropharyngeal exudate.  Eyes: Conjunctivae are normal. Right eye exhibits no discharge. Left eye exhibits no discharge. No scleral icterus.  Neck: Normal range of motion. Neck supple.  Cardiovascular: Normal rate, regular rhythm, normal heart sounds and intact distal pulses.   Pulmonary/Chest: Effort normal. Quiet breath sounds bilaterally. No respiratory distress. No wheezes. No rales.  Abdominal: Soft. Bowel sounds are normal. Exhibits no distension and no mass. There is no tenderness.  Musculoskeletal: Normal range of motion. Wearing compression stockings Lymphadenopathy:    No cervical adenopathy.  Neurological: Alert and oriented to person, place, and time. Exhibits muscle wasting. Examined in the wheelchair.  Skin: Skin is warm and dry. No rash noted. Not diaphoretic. No erythema. No pallor.  Psychiatric: Mood, memory and judgment normal.  Vitals reviewed.  LABORATORY DATA: Lab Results  Component Value Date   WBC 3.9 (L) 08/11/2024   HGB 10.8 (L) 08/11/2024   HCT 34.5 (L) 08/11/2024   MCV 93.2 08/11/2024   PLT 181 08/11/2024      Chemistry      Component Value Date/Time   NA 140 08/11/2024 0743   K 4.7 08/11/2024 0743   CL 99 08/11/2024 0743   CO2 35 (H) 08/11/2024 0743   BUN 17 08/11/2024 0743   CREATININE 0.91 08/11/2024 0743      Component Value Date/Time   CALCIUM  9.1 08/11/2024 0743   ALKPHOS 77 08/11/2024 0743   AST 16 08/11/2024 0743   ALT 10 08/11/2024 0743   BILITOT 0.5 08/11/2024 0743       RADIOGRAPHIC STUDIES:  DG  Chest Port 1 View Result Date: 07/13/2024 EXAM: 1 VIEW XRAY OF THE CHEST 07/13/2024 04:51:00 PM COMPARISON: None available. CLINICAL HISTORY: SOB. Pt having increased SHOB, cough for 6 days. Pt just finished prednisone . FINDINGS: LINES, TUBES AND DEVICES: Right chest wall Port-A-Cath in place with tip overlying the superior cavoatrial junction. LUNGS AND PLEURA: No focal pulmonary opacity. No pulmonary edema. No pleural effusion. No pneumothorax. HEART AND MEDIASTINUM: No acute abnormality of the cardiac and mediastinal silhouettes. Atherosclerotic plaque. BONES AND SOFT TISSUES:  Old healed right rib fractures. IMPRESSION: 1. No acute cardiopulmonary process. 2. Right chest wall Port-A-Cath with tip at the superior cavoatrial junction, appropriately positioned. 3. Old healed right rib fractures. Electronically signed by: Norleen Boxer MD 07/13/2024 05:16 PM EDT RP Workstation: [id number]     ASSESSMENT/PLAN:  This is a very pleasant 58 years old Caucasian male with stage IV (T3, N2, M1 C) non-small cell lung cancer, adenocarcinoma presented with large right upper lobe lung mass in addition to right hilar and subcarinal lymphadenopathy in addition to multiple metastatic bone lesions involving the pelvis as well as the lower thoracic and lumbar spines in addition to metastatic disease to the pelvic muscles diagnosed in February 2024.  Molecular studies showed positive KRAS G12C mutation and PD-L1 expression of 98%.   The patient is here today to start the first cycle of systemic chemotherapy with carboplatin  for AUC of 5, Alimta  500 Mg/M2 and Keytruda  200 Mg IV every 3 weeks.  First dose November 14, 2022.  Status post 30 cycles.  Starting from cycle #5 the patient is on maintenance treatment with Alimta  and Keytruda  every 3 weeks.    He received radiation to the iliac spine on 12/25/2023  Labs were reviewed. Recommend that he proceed with cycle #31 Friday as scheduled.    We will see him back for  follow-up visit in 3 weeks for evaluation repeat blood work before undergoing cycle #32.   Will continue on his blood thinner for his history of DVT.    The patient was advised to call immediately if she has any concerning symptoms in the interval. The patient voices understanding of current disease status and treatment options and is in agreement with the current care plan. All questions were answered. The patient knows to call the clinic with any problems, questions or concerns. We can certainly see the patient much sooner if necessary  No orders of the defined types were placed in this encounter.    The total time spent in the appointment was 20-29 minutes  Terrel Manalo L Javiana Anwar, PA-C 08/11/24

## 2024-08-11 ENCOUNTER — Inpatient Hospital Stay

## 2024-08-11 ENCOUNTER — Inpatient Hospital Stay: Admitting: Physician Assistant

## 2024-08-11 ENCOUNTER — Other Ambulatory Visit (HOSPITAL_COMMUNITY): Payer: Self-pay

## 2024-08-11 VITALS — BP 132/60 | HR 72 | Temp 98.7°F | Resp 18 | Wt 198.2 lb

## 2024-08-11 DIAGNOSIS — C3490 Malignant neoplasm of unspecified part of unspecified bronchus or lung: Secondary | ICD-10-CM | POA: Diagnosis not present

## 2024-08-11 DIAGNOSIS — Z5112 Encounter for antineoplastic immunotherapy: Secondary | ICD-10-CM | POA: Diagnosis not present

## 2024-08-11 DIAGNOSIS — Z5111 Encounter for antineoplastic chemotherapy: Secondary | ICD-10-CM | POA: Diagnosis not present

## 2024-08-11 LAB — TSH: TSH: 1.38 u[IU]/mL (ref 0.350–4.500)

## 2024-08-11 LAB — CMP (CANCER CENTER ONLY)
ALT: 10 U/L (ref 0–44)
AST: 16 U/L (ref 15–41)
Albumin: 3.8 g/dL (ref 3.5–5.0)
Alkaline Phosphatase: 77 U/L (ref 38–126)
Anion gap: 6 (ref 5–15)
BUN: 17 mg/dL (ref 6–20)
CO2: 35 mmol/L — ABNORMAL HIGH (ref 22–32)
Calcium: 9.1 mg/dL (ref 8.9–10.3)
Chloride: 99 mmol/L (ref 98–111)
Creatinine: 0.91 mg/dL (ref 0.61–1.24)
GFR, Estimated: >60 mL/min (ref >60)
Glucose, Bld: 136 mg/dL — ABNORMAL HIGH (ref 70–99)
Potassium: 4.7 mmol/L (ref 3.5–5.1)
Sodium: 140 mmol/L (ref 135–145)
Total Bilirubin: 0.5 mg/dL (ref 0.0–1.2)
Total Protein: 6.9 g/dL (ref 6.5–8.1)

## 2024-08-11 LAB — CBC WITH DIFFERENTIAL (CANCER CENTER ONLY)
Abs Immature Granulocytes: 0 K/uL (ref 0.00–0.07)
Basophils Absolute: 0 K/uL (ref 0.0–0.1)
Basophils Relative: 0 %
Eosinophils Absolute: 0.3 K/uL (ref 0.0–0.5)
Eosinophils Relative: 6 %
HCT: 34.5 % — ABNORMAL LOW (ref 39.0–52.0)
Hemoglobin: 10.8 g/dL — ABNORMAL LOW (ref 13.0–17.0)
Immature Granulocytes: 0 %
Lymphocytes Relative: 10 %
Lymphs Abs: 0.4 K/uL — ABNORMAL LOW (ref 0.7–4.0)
MCH: 29.2 pg (ref 26.0–34.0)
MCHC: 31.3 g/dL (ref 30.0–36.0)
MCV: 93.2 fL (ref 80.0–100.0)
Monocytes Absolute: 0.4 K/uL (ref 0.1–1.0)
Monocytes Relative: 10 %
Neutro Abs: 2.9 K/uL (ref 1.7–7.7)
Neutrophils Relative %: 74 %
Platelet Count: 181 K/uL (ref 150–400)
RBC: 3.7 MIL/uL — ABNORMAL LOW (ref 4.22–5.81)
RDW: 16.5 % — ABNORMAL HIGH (ref 11.5–15.5)
WBC Count: 3.9 K/uL — ABNORMAL LOW (ref 4.0–10.5)
nRBC: 0 % (ref 0.0–0.2)

## 2024-08-12 LAB — T4: T4, Total: 7.5 ug/dL (ref 4.5–12.0)

## 2024-08-15 ENCOUNTER — Inpatient Hospital Stay

## 2024-08-15 VITALS — BP 158/83 | HR 61 | Temp 98.0°F | Resp 18

## 2024-08-15 DIAGNOSIS — C3490 Malignant neoplasm of unspecified part of unspecified bronchus or lung: Secondary | ICD-10-CM

## 2024-08-15 DIAGNOSIS — Z5112 Encounter for antineoplastic immunotherapy: Secondary | ICD-10-CM | POA: Diagnosis not present

## 2024-08-15 MED ORDER — SODIUM CHLORIDE 0.9 % IV SOLN
500.0000 mg/m2 | Freq: Once | INTRAVENOUS | Status: AC
  Administered 2024-08-15: 1000 mg via INTRAVENOUS
  Filled 2024-08-15: qty 40

## 2024-08-15 MED ORDER — SODIUM CHLORIDE 0.9 % IV SOLN: Freq: Once | INTRAVENOUS | Status: AC

## 2024-08-15 MED ORDER — PROCHLORPERAZINE MALEATE 10 MG PO TABS
10.0000 mg | ORAL_TABLET | Freq: Once | ORAL | Status: AC
  Administered 2024-08-15: 10 mg via ORAL
  Filled 2024-08-15: qty 1

## 2024-08-15 MED ORDER — SODIUM CHLORIDE 0.9 % IV SOLN
200.0000 mg | Freq: Once | INTRAVENOUS | Status: AC
  Administered 2024-08-15: 200 mg via INTRAVENOUS
  Filled 2024-08-15: qty 200

## 2024-08-15 MED ORDER — SODIUM CHLORIDE 0.9% FLUSH: 10.0000 mL | INTRAVENOUS | Status: DC | PRN

## 2024-08-15 NOTE — Patient Instructions (Signed)
 [facility] - A DEPT OF [facility]  Discharge Instructions: Thank you for choosing [facility] to provide your oncology and hematology care.   If you have a lab appointment with the Cancer Center, please go directly to the Cancer Center and check in at the registration area.   Wear comfortable clothing and clothing appropriate for easy access to any Portacath or PICC line.   We strive to give you quality time with your provider. You may need to reschedule your appointment if you arrive late (15 or more minutes).  Arriving late affects you and other patients whose appointments are after yours.  Also, if you miss three or more appointments without notifying the office, you may be dismissed from the clinic at the provider's discretion.      For prescription refill requests, have your pharmacy contact our office and allow 72 hours for refills to be completed.    Today you received the following chemotherapy and/or immunotherapy agents keytruda  and alimta        To help prevent nausea and vomiting after your treatment, we encourage you to take your nausea medication as directed.  BELOW ARE SYMPTOMS THAT SHOULD BE REPORTED IMMEDIATELY: *FEVER GREATER THAN 100.4 F (38 C) OR HIGHER *CHILLS OR SWEATING *NAUSEA AND VOMITING THAT IS NOT CONTROLLED WITH YOUR NAUSEA MEDICATION *UNUSUAL SHORTNESS OF BREATH *UNUSUAL BRUISING OR BLEEDING *URINARY PROBLEMS (pain or burning when urinating, or frequent urination) *BOWEL PROBLEMS (unusual diarrhea, constipation, pain near the anus) TENDERNESS IN MOUTH AND THROAT WITH OR WITHOUT PRESENCE OF ULCERS (sore throat, sores in mouth, or a toothache) UNUSUAL RASH, SWELLING OR PAIN  UNUSUAL VAGINAL DISCHARGE OR ITCHING   Items with * indicate a potential emergency and should be followed up as soon as possible or go to the Emergency Department if any problems should occur.  Please show the CHEMOTHERAPY ALERT CARD or  IMMUNOTHERAPY ALERT CARD at check-in to the Emergency Department and triage nurse.  Should you have questions after your visit or need to cancel or reschedule your appointment, please contact [facility] - A DEPT OF [facility]  Dept: [phone]  and follow the prompts.  Office hours are 8:00 a.m. to 4:30 p.m. Monday - Friday. Please note that voicemails left after 4:00 p.m. may not be returned until the following business day.  We are closed weekends and major holidays. You have access to a nurse at all times for urgent questions. Please call the main number to the clinic Dept: [phone] and follow the prompts.   For any non-urgent questions, you may also contact your provider using MyChart. We now offer e-Visits for anyone 33 and older to request care online for non-urgent symptoms. For details visit [email or web address].   Also download the MyChart app! Go to the app store, search MyChart, open the app, select [place], and log in with your MyChart username and password.

## 2024-08-23 ENCOUNTER — Other Ambulatory Visit: Payer: Self-pay | Admitting: Nurse Practitioner

## 2024-08-23 ENCOUNTER — Other Ambulatory Visit (HOSPITAL_COMMUNITY): Payer: Self-pay

## 2024-08-23 DIAGNOSIS — C3490 Malignant neoplasm of unspecified part of unspecified bronchus or lung: Secondary | ICD-10-CM

## 2024-08-23 MED ORDER — FOLIC ACID 1 MG PO TABS
1.0000 mg | ORAL_TABLET | Freq: Every day | ORAL | 1 refills | Status: AC
  Filled 2024-08-23 – 2024-10-23 (×2): qty 90, 90d supply, fill #0

## 2024-08-24 ENCOUNTER — Other Ambulatory Visit: Payer: Self-pay

## 2024-09-03 ENCOUNTER — Other Ambulatory Visit: Payer: Self-pay

## 2024-09-03 ENCOUNTER — Other Ambulatory Visit: Payer: Self-pay | Admitting: Nurse Practitioner

## 2024-09-03 ENCOUNTER — Other Ambulatory Visit (HOSPITAL_COMMUNITY): Payer: Self-pay

## 2024-09-03 DIAGNOSIS — Z515 Encounter for palliative care: Secondary | ICD-10-CM

## 2024-09-03 DIAGNOSIS — C3490 Malignant neoplasm of unspecified part of unspecified bronchus or lung: Secondary | ICD-10-CM

## 2024-09-03 DIAGNOSIS — G893 Neoplasm related pain (acute) (chronic): Secondary | ICD-10-CM

## 2024-09-03 MED ORDER — OXYCODONE HCL ER 20 MG PO T12A
20.0000 mg | EXTENDED_RELEASE_TABLET | Freq: Three times a day (TID) | ORAL | 0 refills | Status: DC
  Filled 2024-09-03: qty 90, 30d supply, fill #0

## 2024-09-03 MED ORDER — OXYCODONE-ACETAMINOPHEN 7.5-325 MG PO TABS
1.0000 | ORAL_TABLET | Freq: Four times a day (QID) | ORAL | 0 refills | Status: DC | PRN
  Filled 2024-09-03: qty 60, 15d supply, fill #0

## 2024-09-04 ENCOUNTER — Encounter: Payer: Self-pay | Admitting: Nurse Practitioner

## 2024-09-04 ENCOUNTER — Inpatient Hospital Stay: Admitting: Internal Medicine

## 2024-09-04 ENCOUNTER — Inpatient Hospital Stay

## 2024-09-04 ENCOUNTER — Inpatient Hospital Stay (HOSPITAL_BASED_OUTPATIENT_CLINIC_OR_DEPARTMENT_OTHER): Admitting: Nurse Practitioner

## 2024-09-04 ENCOUNTER — Inpatient Hospital Stay: Attending: Internal Medicine

## 2024-09-04 VITALS — BP 128/70 | HR 72 | Temp 98.3°F | Resp 15

## 2024-09-04 DIAGNOSIS — C7951 Secondary malignant neoplasm of bone: Secondary | ICD-10-CM | POA: Insufficient documentation

## 2024-09-04 DIAGNOSIS — R634 Abnormal weight loss: Secondary | ICD-10-CM | POA: Insufficient documentation

## 2024-09-04 DIAGNOSIS — Z515 Encounter for palliative care: Secondary | ICD-10-CM

## 2024-09-04 DIAGNOSIS — J9611 Chronic respiratory failure with hypoxia: Secondary | ICD-10-CM | POA: Diagnosis not present

## 2024-09-04 DIAGNOSIS — R530 Neoplastic (malignant) related fatigue: Secondary | ICD-10-CM | POA: Diagnosis not present

## 2024-09-04 DIAGNOSIS — F17210 Nicotine dependence, cigarettes, uncomplicated: Secondary | ICD-10-CM | POA: Insufficient documentation

## 2024-09-04 DIAGNOSIS — F1290 Cannabis use, unspecified, uncomplicated: Secondary | ICD-10-CM | POA: Insufficient documentation

## 2024-09-04 DIAGNOSIS — T451X5A Adverse effect of antineoplastic and immunosuppressive drugs, initial encounter: Secondary | ICD-10-CM | POA: Insufficient documentation

## 2024-09-04 DIAGNOSIS — Z880 Allergy status to penicillin: Secondary | ICD-10-CM | POA: Insufficient documentation

## 2024-09-04 DIAGNOSIS — C778 Secondary and unspecified malignant neoplasm of lymph nodes of multiple regions: Secondary | ICD-10-CM | POA: Diagnosis not present

## 2024-09-04 DIAGNOSIS — Z86718 Personal history of other venous thrombosis and embolism: Secondary | ICD-10-CM | POA: Insufficient documentation

## 2024-09-04 DIAGNOSIS — Z886 Allergy status to analgesic agent status: Secondary | ICD-10-CM | POA: Insufficient documentation

## 2024-09-04 DIAGNOSIS — C3490 Malignant neoplasm of unspecified part of unspecified bronchus or lung: Secondary | ICD-10-CM

## 2024-09-04 DIAGNOSIS — D6481 Anemia due to antineoplastic chemotherapy: Secondary | ICD-10-CM | POA: Diagnosis not present

## 2024-09-04 DIAGNOSIS — Z7901 Long term (current) use of anticoagulants: Secondary | ICD-10-CM | POA: Insufficient documentation

## 2024-09-04 DIAGNOSIS — C3411 Malignant neoplasm of upper lobe, right bronchus or lung: Secondary | ICD-10-CM | POA: Insufficient documentation

## 2024-09-04 DIAGNOSIS — Z5111 Encounter for antineoplastic chemotherapy: Secondary | ICD-10-CM | POA: Diagnosis present

## 2024-09-04 DIAGNOSIS — R5383 Other fatigue: Secondary | ICD-10-CM | POA: Diagnosis not present

## 2024-09-04 DIAGNOSIS — Z79620 Long term (current) use of immunosuppressive biologic: Secondary | ICD-10-CM | POA: Diagnosis not present

## 2024-09-04 DIAGNOSIS — Z5112 Encounter for antineoplastic immunotherapy: Secondary | ICD-10-CM | POA: Diagnosis present

## 2024-09-04 DIAGNOSIS — Z79899 Other long term (current) drug therapy: Secondary | ICD-10-CM | POA: Diagnosis not present

## 2024-09-04 DIAGNOSIS — Z79631 Long term (current) use of antimetabolite agent: Secondary | ICD-10-CM | POA: Diagnosis not present

## 2024-09-04 DIAGNOSIS — Z888 Allergy status to other drugs, medicaments and biological substances status: Secondary | ICD-10-CM | POA: Insufficient documentation

## 2024-09-04 DIAGNOSIS — G893 Neoplasm related pain (acute) (chronic): Secondary | ICD-10-CM | POA: Diagnosis not present

## 2024-09-04 LAB — CBC WITH DIFFERENTIAL (CANCER CENTER ONLY)
Abs Immature Granulocytes: 0.01 K/uL (ref 0.00–0.07)
Basophils Absolute: 0 K/uL (ref 0.0–0.1)
Basophils Relative: 1 %
Eosinophils Absolute: 0.2 K/uL (ref 0.0–0.5)
Eosinophils Relative: 4 %
HCT: 36.5 % — ABNORMAL LOW (ref 39.0–52.0)
Hemoglobin: 11.2 g/dL — ABNORMAL LOW (ref 13.0–17.0)
Immature Granulocytes: 0 %
Lymphocytes Relative: 9 %
Lymphs Abs: 0.5 K/uL — ABNORMAL LOW (ref 0.7–4.0)
MCH: 29.3 pg (ref 26.0–34.0)
MCHC: 30.7 g/dL (ref 30.0–36.0)
MCV: 95.5 fL (ref 80.0–100.0)
Monocytes Absolute: 0.4 K/uL (ref 0.1–1.0)
Monocytes Relative: 7 %
Neutro Abs: 4.7 K/uL (ref 1.7–7.7)
Neutrophils Relative %: 79 %
Platelet Count: 197 K/uL (ref 150–400)
RBC: 3.82 MIL/uL — ABNORMAL LOW (ref 4.22–5.81)
RDW: 16.6 % — ABNORMAL HIGH (ref 11.5–15.5)
WBC Count: 5.8 K/uL (ref 4.0–10.5)
nRBC: 0 % (ref 0.0–0.2)

## 2024-09-04 LAB — CMP (CANCER CENTER ONLY)
ALT: 8 U/L (ref 0–44)
AST: 18 U/L (ref 15–41)
Albumin: 3.9 g/dL (ref 3.5–5.0)
Alkaline Phosphatase: 78 U/L (ref 38–126)
Anion gap: 4 — ABNORMAL LOW (ref 5–15)
BUN: 16 mg/dL (ref 6–20)
CO2: 39 mmol/L — ABNORMAL HIGH (ref 22–32)
Calcium: 9.1 mg/dL (ref 8.9–10.3)
Chloride: 98 mmol/L (ref 98–111)
Creatinine: 0.83 mg/dL (ref 0.61–1.24)
GFR, Estimated: >60 mL/min (ref >60)
Glucose, Bld: 140 mg/dL — ABNORMAL HIGH (ref 70–99)
Potassium: 4 mmol/L (ref 3.5–5.1)
Sodium: 141 mmol/L (ref 135–145)
Total Bilirubin: 0.4 mg/dL (ref 0.0–1.2)
Total Protein: 6.8 g/dL (ref 6.5–8.1)

## 2024-09-04 MED ORDER — PEMBROLIZUMAB CHEMO INJECTION 100 MG/4ML
200.0000 mg | Freq: Once | INTRAVENOUS | Status: AC
  Administered 2024-09-04: 10:00:00 200 mg via INTRAVENOUS
  Filled 2024-09-04: qty 200

## 2024-09-04 MED ORDER — PEMETREXED DISODIUM CHEMO 500 MG/20ML IV SOLN
500.0000 mg/m2 | Freq: Once | INTRAVENOUS | Status: AC
  Administered 2024-09-04: 11:00:00 1000 mg via INTRAVENOUS
  Filled 2024-09-04: qty 40

## 2024-09-04 MED ADMIN — Prochlorperazine Maleate Tab 10 MG (Base Equivalent): 10 mg | ORAL | @ 10:00:00 | NDC 00904738206

## 2024-09-04 MED FILL — Prochlorperazine Maleate Tab 10 MG (Base Equivalent): 10.0000 mg | ORAL | Qty: 1 | Status: AC

## 2024-09-04 NOTE — Patient Instructions (Signed)
 [facility] - A DEPT OF [facility]  Discharge Instructions: Thank you for choosing [facility] to provide your oncology and hematology care.   If you have a lab appointment with the Cancer Center, please go directly to the Cancer Center and check in at the registration area.   Wear comfortable clothing and clothing appropriate for easy access to any Portacath or PICC line.   We strive to give you quality time with your provider. You may need to reschedule your appointment if you arrive late (15 or more minutes).  Arriving late affects you and other patients whose appointments are after yours.  Also, if you miss three or more appointments without notifying the office, you may be dismissed from the clinic at the provider's discretion.      For prescription refill requests, have your pharmacy contact our office and allow 72 hours for refills to be completed.    Today you received the following chemotherapy and/or immunotherapy agents: Keytruda, Alimta.       To help prevent nausea and vomiting after your treatment, we encourage you to take your nausea medication as directed.  BELOW ARE SYMPTOMS THAT SHOULD BE REPORTED IMMEDIATELY: *FEVER GREATER THAN 100.4 F (38 C) OR HIGHER *CHILLS OR SWEATING *NAUSEA AND VOMITING THAT IS NOT CONTROLLED WITH YOUR NAUSEA MEDICATION *UNUSUAL SHORTNESS OF BREATH *UNUSUAL BRUISING OR BLEEDING *URINARY PROBLEMS (pain or burning when urinating, or frequent urination) *BOWEL PROBLEMS (unusual diarrhea, constipation, pain near the anus) TENDERNESS IN MOUTH AND THROAT WITH OR WITHOUT PRESENCE OF ULCERS (sore throat, sores in mouth, or a toothache) UNUSUAL RASH, SWELLING OR PAIN  UNUSUAL VAGINAL DISCHARGE OR ITCHING   Items with * indicate a potential emergency and should be followed up as soon as possible or go to the Emergency Department if any problems should occur.  Please show the CHEMOTHERAPY ALERT CARD or  IMMUNOTHERAPY ALERT CARD at check-in to the Emergency Department and triage nurse.  Should you have questions after your visit or need to cancel or reschedule your appointment, please contact [facility] - A DEPT OF [facility]  Dept: [phone]  and follow the prompts.  Office hours are 8:00 a.m. to 4:30 p.m. Monday - Friday. Please note that voicemails left after 4:00 p.m. may not be returned until the following business day.  We are closed weekends and major holidays. You have access to a nurse at all times for urgent questions. Please call the main number to the clinic Dept: [phone] and follow the prompts.   For any non-urgent questions, you may also contact your provider using MyChart. We now offer e-Visits for anyone 61 and older to request care online for non-urgent symptoms. For details visit [email or web address].   Also download the MyChart app! Go to the app store, search "MyChart", open the app, select , and log in with your MyChart username and password.

## 2024-09-04 NOTE — Progress Notes (Signed)
 [facility] Telephone:(336) [phone]   Fax:([phone]  OFFICE PROGRESS NOTE  Pura Lenis, MD [address] [place] [address]  DIAGNOSIS:  1) Stage IV (T3, N2, M1 C) non-small cell lung cancer, adenocarcinoma presented with large right upper lobe lung mass in addition to right hilar and subcarinal lymphadenopathy in addition to multiple metastatic bone lesions involving the pelvis as well as the lower thoracic and lumbar spines in addition to metastatic disease to the pelvic muscles diagnosed in February 2024.  2) deep venous thrombosis of the right lower extremity diagnosed on December 18, 2022  Detected Alteration(s) / Biomarker(s) Associated FDA-approved therapies Clinical Trial Availability% cfDNA or Amplification  KRAS G12C approved by FDA Adagrasib, Sotorasib Yes 29.3%  CDK4 Amplification None Yes High (+++)Plasma Copy Number: 8.0  PD-L1 expression 98%  PRIOR THERAPY: None  CURRENT THERAPY:  1) Systemic chemotherapy with carboplatin  for AUC of 5, Alimta  500 Mg/M2 and Keytruda  200 Mg IV every 3 weeks.  First dose November 14, 2022.  Status post 31 cycles.  Starting from cycle #5 the patient is on maintenance treatment with Alimta  and Keytruda  every 3 weeks. 2) Eliquis  10 mg p.o. twice daily for 7 days followed by 5 mg p.o. twice daily.  First dose 12/26/2022.  INTERVAL HISTORY: Maxwell Grant 58 y.o. male returns to the clinic today for follow-up visit after by his sister. Discussed the use of AI scribe software for clinical note transcription with the patient, who gave verbal consent to proceed.  History of Present Illness Maxwell Grant is a 58 year old male with stage IV non-small cell lung adenocarcinoma (KRAS G12C mutation, PD-L1 98%) who presents for evaluation prior to cycle 32 of maintenance systemic therapy.  Diagnosed in April 2024 with stage IV non-small cell lung adenocarcinoma, he initially received four cycles of  carboplatin , pemetrexed , and pembrolizumab , followed by maintenance pemetrexed  and pembrolizumab . He has completed 31 cycles of maintenance therapy. Molecular profiling revealed a KRAS G12C mutation and high PD-L1 expression (98%), with no other actionable mutations identified.  He continues to experience persistent fatigue, describing constant tiredness. He uses supplemental oxygen at 2 L/min at rest and increases to 4 L/min with exertion, requiring oxygen most of the time. He notes desaturation with activity but denies chest pain, hemoptysis, or cough. He reports weight fluctuations, including some recent weight loss.   MEDICAL HISTORY: Past Medical History:  Diagnosis Date   CHF (congestive heart failure) (HCC)    COPD (chronic obstructive pulmonary disease) (HCC)    Diabetes mellitus without complication (HCC)    Dyspnea    Hypertension    Lung cancer (HCC) 10/2022   Right leg DVT (HCC) 12/21/2022    ALLERGIES:  is allergic to statins, aspirin, flexeril [cyclobenzaprine], and penicillins.  MEDICATIONS:  Current Outpatient Medications  Medication Sig Dispense Refill   Accu-Chek FastClix Lancets MISC Use to test blood sugar 4 times a day 102 each 0   albuterol  (PROVENTIL ) (2.5 MG/3ML) 0.083% nebulizer solution Inhale 3 mL (2.5 mg total) by nebulization every 6 (six) hours as needed for wheezing. 360 mL 3   albuterol  (VENTOLIN  HFA) 108 (90 Base) MCG/ACT inhaler Inhale 1-2 puffs into the lungs every 6 (six) hours as needed for wheezing or shortness of breath. 18 g 1   albuterol  (VENTOLIN  HFA) 108 (90 Base) MCG/ACT inhaler Inhale 2 puffs every 6 (six) hours as needed for wheezing. 54 g 3   apixaban  (ELIQUIS ) 5 MG TABS tablet  Take 1 tablet (5 mg total) by mouth 2 (two) times daily. 60 tablet 5   Azelastine  HCl 137 MCG/SPRAY SOLN Place 2 sprays into nostrils 2 (two) times daily. 30 mL 12   benzonatate  (TESSALON ) 100 MG capsule Take 1 capsule (100 mg total) by mouth 3 (three) times daily as  needed. 30 capsule 2   fluticasone  (FLONASE ) 50 MCG/ACT nasal spray Place 1 spray into both nostrils daily. 16 g 2   Fluticasone -Umeclidin-Vilant (TRELEGY ELLIPTA ) 100-62.5-25 MCG/ACT AEPB Inhale 1 puff into the lungs daily in the afternoon. 60 each 5   folic acid  (FOLVITE ) 1 MG tablet Take 1 tablet (1 mg total) by mouth daily. 90 tablet 1   furosemide  (LASIX ) 20 MG tablet Take 1 tablet (20 mg total) by mouth daily for 1 week. 7 tablet 0   gabapentin  (NEURONTIN ) 300 MG capsule Take 2 capsules (600mg ) in the morning, 1 capsule (300 mg ) midday, and 2 capsules at bedtime (600mg ). 150 capsule 2   glimepiride  (AMARYL ) 2 MG tablet Take 2 mg by mouth in the morning.     glimepiride  (AMARYL ) 2 MG tablet Take one tablet (2 mg dose) by mouth 30 (thirty) minutes before breakfast. 90 tablet 1   guaiFENesin  (MUCINEX ) 600 MG 12 hr tablet Take 1,200 mg by mouth 2 (two) times daily.     lidocaine -prilocaine  (EMLA ) cream Apply to [facility] Prior to Access as needed 30 g 6   meloxicam  (MOBIC ) 7.5 MG tablet Take 1 tablet (7.5 mg total) by mouth 2 (two) times daily. 60 tablet 3   OHTUVAYRE  3 MG/2.5ML SUSP Inhale 3 mg into the lungs 2 (two) times daily.     ondansetron  (ZOFRAN ) 8 MG tablet Take 1 tablet (8 mg) by mouth every 8 hours as needed for nausea or vomiting. 30 tablet 2   OVER THE COUNTER MEDICATION Take 1 tablet by mouth daily as needed (constipation).  Stool softener     oxyCODONE  (OXYCONTIN ) 20 mg 12 hr tablet Take 1 tablet (20 mg total) by mouth every 8 (eight) hours. 90 tablet 0   oxyCODONE -acetaminophen  (PERCOCET) 7.5-325 MG tablet Take 1 tablet by mouth every 6 (six) hours as needed for severe pain (pain score 7-10). 60 tablet 0   prochlorperazine  (COMPAZINE ) 10 MG tablet Take 1 tablet (10 mg total) by mouth every 6 (six) hours as needed for nausea or vomiting. 30 tablet 0   TRELEGY ELLIPTA  100-62.5-25 MCG/ACT AEPB Inhale 1 puff once daily. 180 each 3   No current facility-administered medications for this  visit.    SURGICAL HISTORY:  Past Surgical History:  Procedure Laterality Date   BRONCHIAL NEEDLE ASPIRATION BIOPSY  10/30/2022   Procedure: BRONCHIAL NEEDLE ASPIRATION BIOPSIES;  Surgeon: Shelah Lamar RAMAN, MD;  Location: [facility] ENDOSCOPY;  Service: Cardiopulmonary;;   IR IMAGING GUIDED PORT INSERTION  11/17/2022   NO PAST SURGERIES     RADIOLOGY WITH ANESTHESIA  10/30/2022   Procedure: MRI WITH ANESTHESIA W/WO CONTRAST;  Surgeon: Shelah Lamar RAMAN, MD;  Location: [facility] ENDOSCOPY;  Service: Cardiopulmonary;;   VIDEO BRONCHOSCOPY WITH ENDOBRONCHIAL ULTRASOUND Right 10/30/2022   Procedure: VIDEO BRONCHOSCOPY WITH ENDOBRONCHIAL ULTRASOUND;  Surgeon: Shelah Lamar RAMAN, MD;  Location: [facility] ENDOSCOPY;  Service: Cardiopulmonary;  Laterality: Right;    REVIEW OF SYSTEMS:  A comprehensive review of systems was negative except for: Constitutional: positive for fatigue Respiratory: positive for dyspnea on exertion Musculoskeletal: positive for arthralgias and back pain   PHYSICAL EXAMINATION: General appearance: alert, cooperative, fatigued, and no distress Head: Normocephalic,  without obvious abnormality, atraumatic Neck: no adenopathy, no JVD, supple, symmetrical, trachea midline, and thyroid  not enlarged, symmetric, no tenderness/mass/nodules Lymph nodes: Cervical, supraclavicular, and axillary nodes normal. Resp: clear to auscultation bilaterally Back: symmetric, no curvature. ROM normal. No CVA tenderness. Cardio: regular rate and rhythm, S1, S2 normal, no murmur, click, rub or gallop GI: soft, non-tender; bowel sounds normal; no masses,  no organomegaly Extremities: extremities normal, atraumatic, no cyanosis or edema  ECOG PERFORMANCE STATUS: 1 - Symptomatic but completely ambulatory  There were no vitals taken for this visit.  LABORATORY DATA: Lab Results  Component Value Date   WBC 3.9 (L) 08/11/2024   HGB 10.8 (L) 08/11/2024   HCT 34.5 (L) 08/11/2024   MCV 93.2 08/11/2024   PLT 181 08/11/2024       Chemistry      Component Value Date/Time   NA 140 08/11/2024 0743   K 4.7 08/11/2024 0743   CL 99 08/11/2024 0743   CO2 35 (H) 08/11/2024 0743   BUN 17 08/11/2024 0743   CREATININE 0.91 08/11/2024 0743      Component Value Date/Time   CALCIUM  9.1 08/11/2024 0743   ALKPHOS 77 08/11/2024 0743   AST 16 08/11/2024 0743   ALT 10 08/11/2024 0743   BILITOT 0.5 08/11/2024 0743       RADIOGRAPHIC STUDIES: No results found.     ASSESSMENT AND PLAN: This is a very pleasant 58 years old white male with stage IV (T3, N2, M1 C) non-small cell lung cancer, adenocarcinoma presented with large right upper lobe lung mass in addition to right hilar and subcarinal lymphadenopathy in addition to multiple metastatic bone lesions involving the pelvis as well as the lower thoracic and lumbar spines in addition to metastatic disease to the pelvic muscles diagnosed in February 2024.  Molecular studies showed positive KRAS G12C mutation and PD-L1 expression of 98%. The patient is here today to start the first cycle of systemic chemotherapy with carboplatin  for AUC of 5, Alimta  500 Mg/M2 and Keytruda  200 Mg IV every 3 weeks.  First dose November 14, 2022.  Status post 31 cycles.  Starting from cycle #5 the patient is on maintenance treatment with Alimta  and Keytruda  every 3 weeks.  He has been tolerating this treatment fairly well. Assessment and Plan Assessment & Plan Stage IV non-small cell lung adenocarcinoma of the lung with KRAS G12C mutation and high PD-L1 expression Metastatic lung adenocarcinoma with KRAS G12C mutation and high PD-L1 expression, managed with maintenance pemetrexed  and pembrolizumab . He has completed 31 cycles of maintenance therapy. Imaging is planned to assess ongoing disease status. - Proceed with cycle 32 of maintenance pemetrexed  and pembrolizumab . - Order imaging in approximately two weeks to assess disease status. - Schedule follow-up in three weeks to review imaging and  clinical status.  Anemia secondary to antineoplastic chemotherapy Chemotherapy-induced anemia has improved compared to prior visits, without acute symptoms or complications.  Chronic respiratory failure with hypoxemia Chronic hypoxemic respiratory failure requiring supplemental oxygen at 2 L/min at rest and 4 L/min with exertion. He experiences desaturation with activity but no new respiratory complaints or acute decompensation. He was advised to call immediately if he has any other concerning symptoms in the interval.  The patient voices understanding of current disease status and treatment options and is in agreement with the current care plan.  All questions were answered. The patient knows to call the clinic with any problems, questions or concerns. We can certainly see the patient much sooner if necessary.  The total time spent in the appointment was 20 minutes.  Disclaimer: This note was dictated with voice recognition software. Similar sounding words can inadvertently be transcribed and may not be corrected upon review.

## 2024-09-04 NOTE — Progress Notes (Signed)
 Palliative Medicine [name]  Telephone:(336) [phone] Fax:(336) [phone]   Name: Maxwell Grant Date: 09/04/2024 MRN: [id number]  DOB: [birth year]  Patient Care Team: Pura Lenis, MD as PCP - General (Family Medicine)   INTERVAL HISTORY: Maxwell Grant is a 58 y.o. male with oncologic medical history including new diagnosed non-small cell lung cancer (10/2022), as well as a history of COPD, diabetes mellitus, hypertension, dyslipidemia as well as history of osteomyelitis of the back.  Palliative ask to see for symptom and pain management and goals of care.   SOCIAL HISTORY:     reports that he has been smoking cigarettes. He has never used smokeless tobacco. He reports current alcohol use of about 1.0 standard drink of alcohol per week. He reports current drug use. Drug: Marijuana.  ADVANCE DIRECTIVES:  None on file   CODE STATUS: Full code  PAST MEDICAL HISTORY: Past Medical History:  Diagnosis Date   CHF (congestive heart failure) (HCC)    COPD (chronic obstructive pulmonary disease) (HCC)    Diabetes mellitus without complication (HCC)    Dyspnea    Hypertension    Lung cancer (HCC) 10/2022   Right leg DVT (HCC) 12/21/2022    ALLERGIES:  is allergic to statins, aspirin, flexeril [cyclobenzaprine], and penicillins.  MEDICATIONS:  Current Outpatient Medications  Medication Sig Dispense Refill   Accu-Chek FastClix Lancets MISC Use to test blood sugar 4 times a day 102 each 0   albuterol  (PROVENTIL ) (2.5 MG/3ML) 0.083% nebulizer solution Inhale 3 mL (2.5 mg total) by nebulization every 6 (six) hours as needed for wheezing. 360 mL 3   albuterol  (VENTOLIN  HFA) 108 (90 Base) MCG/ACT inhaler Inhale 1-2 puffs into the lungs every 6 (six) hours as needed for wheezing or shortness of breath. 18 g 1   albuterol  (VENTOLIN  HFA) 108 (90 Base) MCG/ACT inhaler Inhale 2 puffs every 6 (six) hours as needed for wheezing. 54 g 3   apixaban  (ELIQUIS ) 5 MG TABS tablet  Take 1 tablet (5 mg total) by mouth 2 (two) times daily. 60 tablet 5   Azelastine  HCl 137 MCG/SPRAY SOLN Place 2 sprays into nostrils 2 (two) times daily. 30 mL 12   benzonatate  (TESSALON ) 100 MG capsule Take 1 capsule (100 mg total) by mouth 3 (three) times daily as needed. 30 capsule 2   fluticasone  (FLONASE ) 50 MCG/ACT nasal spray Place 1 spray into both nostrils daily. 16 g 2   Fluticasone -Umeclidin-Vilant (TRELEGY ELLIPTA ) 100-62.5-25 MCG/ACT AEPB Inhale 1 puff into the lungs daily in the afternoon. 60 each 5   folic acid  (FOLVITE ) 1 MG tablet Take 1 tablet (1 mg total) by mouth daily. 90 tablet 1   furosemide  (LASIX ) 20 MG tablet Take 1 tablet (20 mg total) by mouth daily for 1 week. 7 tablet 0   gabapentin  (NEURONTIN ) 300 MG capsule Take 2 capsules (600mg ) in the morning, 1 capsule (300 mg ) midday, and 2 capsules at bedtime (600mg ). 150 capsule 2   glimepiride  (AMARYL ) 2 MG tablet Take 2 mg by mouth in the morning.     glimepiride  (AMARYL ) 2 MG tablet Take one tablet (2 mg dose) by mouth 30 (thirty) minutes before breakfast. 90 tablet 1   guaiFENesin  (MUCINEX ) 600 MG 12 hr tablet Take 1,200 mg by mouth 2 (two) times daily.     lidocaine -prilocaine  (EMLA ) cream Apply to [facility] Prior to Access as needed 30 g 6   meloxicam  (MOBIC ) 7.5 MG tablet Take 1 tablet (7.5 mg total) by  mouth 2 (two) times daily. 60 tablet 3   OHTUVAYRE  3 MG/2.5ML SUSP Inhale 3 mg into the lungs 2 (two) times daily.     ondansetron  (ZOFRAN ) 8 MG tablet Take 1 tablet (8 mg) by mouth every 8 hours as needed for nausea or vomiting. 30 tablet 2   OVER THE COUNTER MEDICATION Take 1 tablet by mouth daily as needed (constipation).  Stool softener     oxyCODONE  (OXYCONTIN ) 20 mg 12 hr tablet Take 1 tablet (20 mg total) by mouth every 8 (eight) hours. 90 tablet 0   oxyCODONE -acetaminophen  (PERCOCET) 7.5-325 MG tablet Take 1 tablet by mouth every 6 (six) hours as needed for severe pain (pain score 7-10). 60 tablet 0    prochlorperazine  (COMPAZINE ) 10 MG tablet Take 1 tablet (10 mg total) by mouth every 6 (six) hours as needed for nausea or vomiting. 30 tablet 0   TRELEGY ELLIPTA  100-62.5-25 MCG/ACT AEPB Inhale 1 puff once daily. 180 each 3   No current facility-administered medications for this visit.    VITAL SIGNS: There were no vitals taken for this visit. There were no vitals filed for this visit.  Estimated body mass index is 31.99 kg/m as calculated from the following:   Height as of 07/24/24: 5' 6 (1.676 m).   Weight as of 08/11/24: 198 lb 3.2 oz (89.9 kg).  PERFORMANCE STATUS (ECOG) : 1 - Symptomatic but completely ambulatory  Physical Exam General: NAD Cardiovascular: regular rate and rhythm Pulmonary: normal breathing pattern Extremities: no edema, no joint deformities Skin: no rashes Neurological: AAO x3  IMPRESSION: Discussed the use of AI scribe software for clinical note transcription with the patient, who gave verbal consent to proceed.  History of Present Illness Maxwell Grant is a 58 year old male with non-small cell lung cancer who presented to clinic for follow-up. Accompanied by family. No acute distress. Denies concerns for nausea, vomiting, constipation, or diarrhea. Is remaining as active as possible.  He experiences persistent fatigue. This symptom has been ongoing and significantly impacts his daily life, necessitating frequent breaks to manage his energy levels.  Maxwell Grant reports appetite fluctuates. Some days are better than others. Weight is stable at 198lb.  Continues to focus on small frequent meals and pushing protein intake.   We discussed his pain regimen which consists of OxyContin  every eight hours and Percocet as needed for severe pain, sometimes up to twice a day. He also takes gabapentin , two capsules in the morning and two at night. Pain is effectively managed at this time. No adjustments needed. We will continue to closely monitor and support.    All questions answered and support provided.   Assessment & Plan  Follow-Up - Will see patient back in collaboration with scheduled appointments in 3-4 weeks. Sooner if needed.   Cancer pain Pain is well-managed with Oxycontin . He reports good energy levels, sleep, and appetite. He has gained two pounds and wishes to manage his weight. - Ensure prior approval for Oxycontin  refill if needed. - OxyContin  to every 8 hours. - Continue Percocet as needed for severe pain, every 4 to 6 hours.  Fatigue Chronic fatigue likely secondary to ongoing treatment. No associated nausea, vomiting, constipation, or diarrhea. Appetite is stable. - Encouraged staying active as tolerated. - Advised taking breaks when fatigued.  Patient expressed understanding and was in agreement with this plan. He also understands that He can call the clinic at any time with any questions, concerns, or complaints.   Any controlled  substances utilized were prescribed in the context of palliative care. PDMP has been reviewed.   Visit consisted of counseling and education dealing with the complex and emotionally intense issues of symptom management and palliative care in the setting of serious and potentially life-threatening illness.  Levon Borer, AGPCNP-BC  Palliative Medicine Team/[facility]

## 2024-09-12 ENCOUNTER — Other Ambulatory Visit (HOSPITAL_COMMUNITY): Payer: Self-pay

## 2024-09-12 ENCOUNTER — Other Ambulatory Visit: Payer: Self-pay

## 2024-09-18 ENCOUNTER — Encounter: Payer: Self-pay | Admitting: Internal Medicine

## 2024-09-19 ENCOUNTER — Ambulatory Visit (HOSPITAL_COMMUNITY): Admission: RE | Admit: 2024-09-19 | Source: Ambulatory Visit

## 2024-09-19 DIAGNOSIS — C3490 Malignant neoplasm of unspecified part of unspecified bronchus or lung: Secondary | ICD-10-CM | POA: Diagnosis present

## 2024-09-19 MED ORDER — HEPARIN SOD (PORK) LOCK FLUSH 100 UNIT/ML IV SOLN
500.0000 [IU] | Freq: Once | INTRAVENOUS | Status: AC
  Administered 2024-09-19: 500 [IU] via INTRAVENOUS

## 2024-09-19 MED ORDER — IOHEXOL 300 MG/ML  SOLN
100.0000 mL | Freq: Once | INTRAMUSCULAR | Status: AC | PRN
  Administered 2024-09-19: 100 mL via INTRAVENOUS

## 2024-09-25 ENCOUNTER — Inpatient Hospital Stay

## 2024-09-25 ENCOUNTER — Inpatient Hospital Stay: Attending: Internal Medicine

## 2024-09-25 ENCOUNTER — Inpatient Hospital Stay (HOSPITAL_BASED_OUTPATIENT_CLINIC_OR_DEPARTMENT_OTHER): Admitting: Internal Medicine

## 2024-09-25 VITALS — BP 142/63 | HR 61 | Temp 98.0°F | Resp 17 | Ht 66.0 in | Wt 203.9 lb

## 2024-09-25 DIAGNOSIS — J449 Chronic obstructive pulmonary disease, unspecified: Secondary | ICD-10-CM | POA: Insufficient documentation

## 2024-09-25 DIAGNOSIS — Z880 Allergy status to penicillin: Secondary | ICD-10-CM | POA: Diagnosis not present

## 2024-09-25 DIAGNOSIS — J432 Centrilobular emphysema: Secondary | ICD-10-CM | POA: Diagnosis not present

## 2024-09-25 DIAGNOSIS — J479 Bronchiectasis, uncomplicated: Secondary | ICD-10-CM | POA: Diagnosis not present

## 2024-09-25 DIAGNOSIS — J9611 Chronic respiratory failure with hypoxia: Secondary | ICD-10-CM | POA: Diagnosis not present

## 2024-09-25 DIAGNOSIS — I700 Atherosclerosis of aorta: Secondary | ICD-10-CM | POA: Diagnosis not present

## 2024-09-25 DIAGNOSIS — Z5112 Encounter for antineoplastic immunotherapy: Secondary | ICD-10-CM | POA: Insufficient documentation

## 2024-09-25 DIAGNOSIS — E785 Hyperlipidemia, unspecified: Secondary | ICD-10-CM | POA: Diagnosis not present

## 2024-09-25 DIAGNOSIS — C3490 Malignant neoplasm of unspecified part of unspecified bronchus or lung: Secondary | ICD-10-CM | POA: Diagnosis not present

## 2024-09-25 DIAGNOSIS — F1290 Cannabis use, unspecified, uncomplicated: Secondary | ICD-10-CM | POA: Diagnosis not present

## 2024-09-25 DIAGNOSIS — G893 Neoplasm related pain (acute) (chronic): Secondary | ICD-10-CM | POA: Insufficient documentation

## 2024-09-25 DIAGNOSIS — Z9981 Dependence on supplemental oxygen: Secondary | ICD-10-CM | POA: Insufficient documentation

## 2024-09-25 DIAGNOSIS — Z79899 Other long term (current) drug therapy: Secondary | ICD-10-CM | POA: Insufficient documentation

## 2024-09-25 DIAGNOSIS — N62 Hypertrophy of breast: Secondary | ICD-10-CM | POA: Insufficient documentation

## 2024-09-25 DIAGNOSIS — Z86718 Personal history of other venous thrombosis and embolism: Secondary | ICD-10-CM | POA: Diagnosis not present

## 2024-09-25 DIAGNOSIS — N281 Cyst of kidney, acquired: Secondary | ICD-10-CM | POA: Diagnosis not present

## 2024-09-25 DIAGNOSIS — I2510 Atherosclerotic heart disease of native coronary artery without angina pectoris: Secondary | ICD-10-CM | POA: Diagnosis not present

## 2024-09-25 DIAGNOSIS — K4020 Bilateral inguinal hernia, without obstruction or gangrene, not specified as recurrent: Secondary | ICD-10-CM | POA: Diagnosis not present

## 2024-09-25 DIAGNOSIS — Z886 Allergy status to analgesic agent status: Secondary | ICD-10-CM | POA: Insufficient documentation

## 2024-09-25 DIAGNOSIS — Z7901 Long term (current) use of anticoagulants: Secondary | ICD-10-CM | POA: Diagnosis not present

## 2024-09-25 DIAGNOSIS — E119 Type 2 diabetes mellitus without complications: Secondary | ICD-10-CM | POA: Diagnosis not present

## 2024-09-25 DIAGNOSIS — E1169 Type 2 diabetes mellitus with other specified complication: Secondary | ICD-10-CM | POA: Diagnosis not present

## 2024-09-25 DIAGNOSIS — F17210 Nicotine dependence, cigarettes, uncomplicated: Secondary | ICD-10-CM | POA: Diagnosis not present

## 2024-09-25 DIAGNOSIS — Z888 Allergy status to other drugs, medicaments and biological substances status: Secondary | ICD-10-CM | POA: Insufficient documentation

## 2024-09-25 DIAGNOSIS — C7951 Secondary malignant neoplasm of bone: Secondary | ICD-10-CM | POA: Insufficient documentation

## 2024-09-25 DIAGNOSIS — Z5111 Encounter for antineoplastic chemotherapy: Secondary | ICD-10-CM | POA: Insufficient documentation

## 2024-09-25 DIAGNOSIS — N200 Calculus of kidney: Secondary | ICD-10-CM | POA: Diagnosis not present

## 2024-09-25 DIAGNOSIS — C3411 Malignant neoplasm of upper lobe, right bronchus or lung: Secondary | ICD-10-CM | POA: Diagnosis present

## 2024-09-25 LAB — CBC WITH DIFFERENTIAL (CANCER CENTER ONLY)
Abs Immature Granulocytes: 0.02 K/uL (ref 0.00–0.07)
Basophils Absolute: 0 K/uL (ref 0.0–0.1)
Basophils Relative: 0 %
Eosinophils Absolute: 0.3 K/uL (ref 0.0–0.5)
Eosinophils Relative: 5 %
HCT: 36.6 % — ABNORMAL LOW (ref 39.0–52.0)
Hemoglobin: 11.3 g/dL — ABNORMAL LOW (ref 13.0–17.0)
Immature Granulocytes: 0 %
Lymphocytes Relative: 8 %
Lymphs Abs: 0.5 K/uL — ABNORMAL LOW (ref 0.7–4.0)
MCH: 30.1 pg (ref 26.0–34.0)
MCHC: 30.9 g/dL (ref 30.0–36.0)
MCV: 97.3 fL (ref 80.0–100.0)
Monocytes Absolute: 0.3 K/uL (ref 0.1–1.0)
Monocytes Relative: 5 %
Neutro Abs: 4.6 K/uL (ref 1.7–7.7)
Neutrophils Relative %: 82 %
Platelet Count: 166 K/uL (ref 150–400)
RBC: 3.76 MIL/uL — ABNORMAL LOW (ref 4.22–5.81)
RDW: 16.2 % — ABNORMAL HIGH (ref 11.5–15.5)
WBC Count: 5.7 K/uL (ref 4.0–10.5)
nRBC: 0 % (ref 0.0–0.2)

## 2024-09-25 LAB — CMP (CANCER CENTER ONLY)
ALT: 12 U/L (ref 0–44)
AST: 20 U/L (ref 15–41)
Albumin: 3.8 g/dL (ref 3.5–5.0)
Alkaline Phosphatase: 77 U/L (ref 38–126)
Anion gap: 4 — ABNORMAL LOW (ref 5–15)
BUN: 14 mg/dL (ref 6–20)
CO2: 37 mmol/L — ABNORMAL HIGH (ref 22–32)
Calcium: 8.7 mg/dL — ABNORMAL LOW (ref 8.9–10.3)
Chloride: 99 mmol/L (ref 98–111)
Creatinine: 0.99 mg/dL (ref 0.61–1.24)
GFR, Estimated: >60 mL/min
Glucose, Bld: 140 mg/dL — ABNORMAL HIGH (ref 70–99)
Potassium: 4.3 mmol/L (ref 3.5–5.1)
Sodium: 140 mmol/L (ref 135–145)
Total Bilirubin: 0.5 mg/dL (ref 0.0–1.2)
Total Protein: 6.6 g/dL (ref 6.5–8.1)

## 2024-09-25 LAB — TSH: TSH: 3.51 u[IU]/mL (ref 0.350–4.500)

## 2024-09-25 MED ORDER — CYANOCOBALAMIN 1000 MCG/ML IJ SOLN
1000.0000 ug | Freq: Once | INTRAMUSCULAR | Status: AC
  Administered 2024-09-25: 1000 ug via INTRAMUSCULAR
  Filled 2024-09-25: qty 1

## 2024-09-25 MED ORDER — PROCHLORPERAZINE MALEATE 10 MG PO TABS
10.0000 mg | ORAL_TABLET | Freq: Once | ORAL | Status: AC
  Administered 2024-09-25: 10 mg via ORAL
  Filled 2024-09-25: qty 1

## 2024-09-25 MED ORDER — SODIUM CHLORIDE 0.9 % IV SOLN: Freq: Once | INTRAVENOUS | Status: AC

## 2024-09-25 MED ORDER — SODIUM CHLORIDE 0.9 % IV SOLN
500.0000 mg/m2 | Freq: Once | INTRAVENOUS | Status: AC
  Administered 2024-09-25: 1000 mg via INTRAVENOUS
  Filled 2024-09-25: qty 40

## 2024-09-25 MED ORDER — SODIUM CHLORIDE 0.9 % IV SOLN
200.0000 mg | Freq: Once | INTRAVENOUS | Status: AC
  Administered 2024-09-25: 200 mg via INTRAVENOUS
  Filled 2024-09-25: qty 200

## 2024-09-25 NOTE — Patient Instructions (Signed)
 [facility] - A DEPT OF [facility]  Discharge Instructions: Thank you for choosing [facility] to provide your oncology and hematology care.   If you have a lab appointment with the Cancer Center, please go directly to the Cancer Center and check in at the registration area.   Wear comfortable clothing and clothing appropriate for easy access to any Portacath or PICC line.   We strive to give you quality time with your provider. You may need to reschedule your appointment if you arrive late (15 or more minutes).  Arriving late affects you and other patients whose appointments are after yours.  Also, if you miss three or more appointments without notifying the office, you may be dismissed from the clinic at the providers discretion.      For prescription refill requests, have your pharmacy contact our office and allow 72 hours for refills to be completed.    Today you received the following chemotherapy and/or immunotherapy agents:  PEMEtrexed  Disodium (ALIMTA )  pembrolizumab  (KEYTRUDA )    To help prevent nausea and vomiting after your treatment, we encourage you to take your nausea medication as directed.  BELOW ARE SYMPTOMS THAT SHOULD BE REPORTED IMMEDIATELY: *FEVER GREATER THAN 100.4 F (38 C) OR HIGHER *CHILLS OR SWEATING *NAUSEA AND VOMITING THAT IS NOT CONTROLLED WITH YOUR NAUSEA MEDICATION *UNUSUAL SHORTNESS OF BREATH *UNUSUAL BRUISING OR BLEEDING *URINARY PROBLEMS (pain or burning when urinating, or frequent urination) *BOWEL PROBLEMS (unusual diarrhea, constipation, pain near the anus) TENDERNESS IN MOUTH AND THROAT WITH OR WITHOUT PRESENCE OF ULCERS (sore throat, sores in mouth, or a toothache) UNUSUAL RASH, SWELLING OR PAIN  UNUSUAL VAGINAL DISCHARGE OR ITCHING   Items with * indicate a potential emergency and should be followed up as soon as possible or go to the Emergency Department if any problems should occur.  Please  show the CHEMOTHERAPY ALERT CARD or IMMUNOTHERAPY ALERT CARD at check-in to the Emergency Department and triage nurse.  Should you have questions after your visit or need to cancel or reschedule your appointment, please contact [facility] - A DEPT OF [facility]  Dept: [phone]  and follow the prompts.  Office hours are 8:00 a.m. to 4:30 p.m. Monday - Friday. Please note that voicemails left after 4:00 p.m. may not be returned until the following business day.  We are closed weekends and major holidays. You have access to a nurse at all times for urgent questions. Please call the main number to the clinic Dept: [phone] and follow the prompts.   For any non-urgent questions, you may also contact your provider using MyChart. We now offer e-Visits for anyone 78 and older to request care online for non-urgent symptoms. For details visit [email or web address].   Also download the MyChart app! Go to the app store, search MyChart, open the app, select [facility], and log in with your MyChart username and password.

## 2024-09-25 NOTE — Progress Notes (Signed)
 "     [facility] [phone]   [phone]  OFFICE PROGRESS NOTE  Pura Lenis, MD [address] [place] [address]  DIAGNOSIS:  1) Stage IV (T3, N2, M1 C) non-small cell lung cancer, adenocarcinoma presented with large right upper lobe lung mass in addition to right hilar and subcarinal lymphadenopathy in addition to multiple metastatic bone lesions involving the pelvis as well as the lower thoracic and lumbar spines in addition to metastatic disease to the pelvic muscles diagnosed in February 2024.  2) deep venous thrombosis of the right lower extremity diagnosed on December 18, 2022  Detected Alteration(s) / Biomarker(s) Associated FDA-approved therapies Clinical Trial Availability% cfDNA or Amplification  KRAS G12C approved by FDA Adagrasib, Sotorasib Yes 29.3%  CDK4 Amplification None Yes High (+++)Plasma Copy Number: 8.0  PD-L1 expression 98%  PRIOR THERAPY: None  CURRENT THERAPY:  1) Systemic chemotherapy with carboplatin  for AUC of 5, Alimta  500 Mg/M2 and Keytruda  200 Mg IV every 3 weeks.  First dose November 14, 2022.  Status post 32 cycles.  Starting from cycle #5 the patient is on maintenance treatment with Alimta  and Keytruda  every 3 weeks. 2) Eliquis  10 mg p.o. twice daily for 7 days followed by 5 mg p.o. twice daily.  First dose 12/26/2022.  INTERVAL HISTORY: Maxwell Grant 59 y.o. male returns to the clinic today for follow-up visit after by his sister. Discussed the use of AI scribe software for clinical note transcription with the patient, who gave verbal consent to proceed.  History of Present Illness Maxwell Grant is a 59 year old male with stage IV non-small cell lung adenocarcinoma who presents for routine oncology follow-up and restaging evaluation.  He was diagnosed in February 2024 with metastatic non-small cell lung adenocarcinoma (KRAS G12C mutation, PD-L1 98%) and has completed 32 cycles of systemic  therapy, initially with carboplatin , pemetrexed , and pembrolizumab , followed by maintenance pemetrexed  and pembrolizumab  every three weeks.  Recent CT imaging of the chest, abdomen, and pelvis was performed for restaging of his disease.  He continues to experience persistent fatigue and chronic back pain, though the pain is less severe than at initial diagnosis. He remains on supplemental oxygen at home, typically at 4 liters per minute, and reports stable respiratory status. He denies chest pain, cough, hemoptysis, or recent weight loss.   MEDICAL HISTORY: Past Medical History:  Diagnosis Date   CHF (congestive heart failure) (HCC)    COPD (chronic obstructive pulmonary disease) (HCC)    Diabetes mellitus without complication (HCC)    Dyspnea    Hypertension    Lung cancer (HCC) 10/2022   Right leg DVT (HCC) 12/21/2022    ALLERGIES:  is allergic to statins, aspirin, flexeril [cyclobenzaprine], and penicillins.  MEDICATIONS:  Current Outpatient Medications  Medication Sig Dispense Refill   Accu-Chek FastClix Lancets MISC Use to test blood sugar 4 times a day 102 each 0   albuterol  (PROVENTIL ) (2.5 MG/3ML) 0.083% nebulizer solution Inhale 3 mL (2.5 mg total) by nebulization every 6 (six) hours as needed for wheezing. 360 mL 3   albuterol  (VENTOLIN  HFA) 108 (90 Base) MCG/ACT inhaler Inhale 1-2 puffs into the lungs every 6 (six) hours as needed for wheezing or shortness of breath. 18 g 1   albuterol  (VENTOLIN  HFA) 108 (90 Base) MCG/ACT inhaler Inhale 2 puffs every 6 (six) hours as needed for wheezing. 54 g 3   apixaban  (ELIQUIS ) 5 MG TABS tablet Take 1 tablet (5 mg  total) by mouth 2 (two) times daily. 60 tablet 5   Azelastine  HCl 137 MCG/SPRAY SOLN Place 2 sprays into nostrils 2 (two) times daily. 30 mL 12   benzonatate  (TESSALON ) 100 MG capsule Take 1 capsule (100 mg total) by mouth 3 (three) times daily as needed. 30 capsule 2   fluticasone  (FLONASE ) 50 MCG/ACT nasal spray Place 1 spray  into both nostrils daily. 16 g 2   Fluticasone -Umeclidin-Vilant (TRELEGY ELLIPTA ) 100-62.5-25 MCG/ACT AEPB Inhale 1 puff into the lungs daily in the afternoon. 60 each 5   folic acid  (FOLVITE ) 1 MG tablet Take 1 tablet (1 mg total) by mouth daily. 90 tablet 1   furosemide  (LASIX ) 20 MG tablet Take 1 tablet (20 mg total) by mouth daily for 1 week. 7 tablet 0   gabapentin  (NEURONTIN ) 300 MG capsule Take 2 capsules (600mg ) in the morning, 1 capsule (300 mg ) midday, and 2 capsules at bedtime (600mg ). 150 capsule 2   glimepiride  (AMARYL ) 2 MG tablet Take 2 mg by mouth in the morning.     glimepiride  (AMARYL ) 2 MG tablet Take one tablet (2 mg dose) by mouth 30 (thirty) minutes before breakfast. 90 tablet 1   guaiFENesin  (MUCINEX ) 600 MG 12 hr tablet Take 1,200 mg by mouth 2 (two) times daily.     lidocaine -prilocaine  (EMLA ) cream Apply to [facility] Prior to Access as needed 30 g 6   meloxicam  (MOBIC ) 7.5 MG tablet Take 1 tablet (7.5 mg total) by mouth 2 (two) times daily. 60 tablet 3   OHTUVAYRE  3 MG/2.5ML SUSP Inhale 3 mg into the lungs 2 (two) times daily.     ondansetron  (ZOFRAN ) 8 MG tablet Take 1 tablet (8 mg) by mouth every 8 hours as needed for nausea or vomiting. 30 tablet 2   OVER THE COUNTER MEDICATION Take 1 tablet by mouth daily as needed (constipation).  Stool softener     oxyCODONE  (OXYCONTIN ) 20 mg 12 hr tablet Take 1 tablet (20 mg total) by mouth every 8 (eight) hours. 90 tablet 0   oxyCODONE -acetaminophen  (PERCOCET) 7.5-325 MG tablet Take 1 tablet by mouth every 6 (six) hours as needed for severe pain (pain score 7-10). 60 tablet 0   prochlorperazine  (COMPAZINE ) 10 MG tablet Take 1 tablet (10 mg total) by mouth every 6 (six) hours as needed for nausea or vomiting. 30 tablet 0   TRELEGY ELLIPTA  100-62.5-25 MCG/ACT AEPB Inhale 1 puff once daily. 180 each 3   No current facility-administered medications for this visit.    SURGICAL HISTORY:  Past Surgical History:  Procedure Laterality  Date   BRONCHIAL NEEDLE ASPIRATION BIOPSY  10/30/2022   Procedure: BRONCHIAL NEEDLE ASPIRATION BIOPSIES;  Surgeon: Shelah Lamar RAMAN, MD;  Location: [facility] ENDOSCOPY;  Service: Cardiopulmonary;;   IR IMAGING GUIDED PORT INSERTION  11/17/2022   NO PAST SURGERIES     RADIOLOGY WITH ANESTHESIA  10/30/2022   Procedure: MRI WITH ANESTHESIA W/WO CONTRAST;  Surgeon: Shelah Lamar RAMAN, MD;  Location: [facility] ENDOSCOPY;  Service: Cardiopulmonary;;   VIDEO BRONCHOSCOPY WITH ENDOBRONCHIAL ULTRASOUND Right 10/30/2022   Procedure: VIDEO BRONCHOSCOPY WITH ENDOBRONCHIAL ULTRASOUND;  Surgeon: Shelah Lamar RAMAN, MD;  Location: [facility] ENDOSCOPY;  Service: Cardiopulmonary;  Laterality: Right;    REVIEW OF SYSTEMS:  Constitutional: positive for fatigue Eyes: negative Ears, nose, mouth, throat, and face: negative Respiratory: positive for dyspnea on exertion Cardiovascular: negative Gastrointestinal: negative Genitourinary:negative Integument/breast: negative Hematologic/lymphatic: negative Musculoskeletal:positive for back pain Neurological: negative Behavioral/Psych: negative Endocrine: negative Allergic/Immunologic: negative   PHYSICAL EXAMINATION: General  appearance: alert, cooperative, fatigued, and no distress Head: Normocephalic, without obvious abnormality, atraumatic Neck: no adenopathy, no JVD, supple, symmetrical, trachea midline, and thyroid  not enlarged, symmetric, no tenderness/mass/nodules Lymph nodes: Cervical, supraclavicular, and axillary nodes normal. Resp: clear to auscultation bilaterally Back: symmetric, no curvature. ROM normal. No CVA tenderness. Cardio: regular rate and rhythm, S1, S2 normal, no murmur, click, rub or gallop GI: soft, non-tender; bowel sounds normal; no masses,  no organomegaly Extremities: extremities normal, atraumatic, no cyanosis or edema Neurologic: Alert and oriented X 3, normal strength and tone. Normal symmetric reflexes. Normal coordination and gait  ECOG PERFORMANCE STATUS:  1 - Symptomatic but completely ambulatory  Blood pressure (!) 142/63, pulse 61, temperature 98 F (36.7 C), temperature source Temporal, resp. rate 17, height 5' 6 (1.676 m), weight 203 lb 14.4 oz (92.5 kg), SpO2 93%.  LABORATORY DATA: Lab Results  Component Value Date   WBC 5.7 09/25/2024   HGB 11.3 (L) 09/25/2024   HCT 36.6 (L) 09/25/2024   MCV 97.3 09/25/2024   PLT 166 09/25/2024      Chemistry      Component Value Date/Time   NA 140 09/25/2024 0948   K 4.3 09/25/2024 0948   CL 99 09/25/2024 0948   CO2 37 (H) 09/25/2024 0948   BUN 14 09/25/2024 0948   CREATININE 0.99 09/25/2024 0948      Component Value Date/Time   CALCIUM  8.7 (L) 09/25/2024 0948   ALKPHOS 77 09/25/2024 0948   AST 20 09/25/2024 0948   ALT 12 09/25/2024 0948   BILITOT 0.5 09/25/2024 0948       RADIOGRAPHIC STUDIES: CT CHEST ABDOMEN PELVIS W CONTRAST Result Date: 09/19/2024 CLINICAL DATA:  Non-small cell lung cancer (NSCLC), staging. * Tracking Code: BO * EXAM: CT CHEST, ABDOMEN, AND PELVIS WITH CONTRAST TECHNIQUE: Multidetector CT imaging of the chest, abdomen and pelvis was performed following the standard protocol during bolus administration of intravenous contrast. RADIATION DOSE REDUCTION: This exam was performed according to the departmental dose-optimization program which includes automated exposure control, adjustment of the mA and/or kV according to patient size and/or use of iterative reconstruction technique. CONTRAST:  OMNIPAQUE  IOHEXOL  300 MG/ML  SOLN COMPARISON:  CT scan chest, abdomen and pelvis from 07/08/2024. FINDINGS: CT CHEST FINDINGS Cardiovascular: Normal cardiac size. No pericardial effusion. No aortic aneurysm. There are coronary artery calcifications, in keeping with coronary artery disease. There are also mild-to-moderate peripheral atherosclerotic vascular calcifications of thoracic aorta and its major branches. Mediastinum/Nodes: Visualized thyroid  gland appears grossly  unremarkable. No solid / cystic mediastinal masses. The esophagus is nondistended precluding optimal assessment. There is mild circumferential thickening of the lower thoracic esophagus, which is most likely seen in the settings of chronic gastroesophageal reflux disease versus esophagitis. There are few mildly prominent mediastinal lymph nodes, which do not meet the size criteria for lymphadenopathy and appear grossly similar to the prior study, favoring benign etiology. No axillary or hilar lymphadenopathy by size criteria. Lungs/Pleura: The central tracheo-bronchial tree is patent. There is mild, smooth, circumferential thickening of the segmental and subsegmental bronchial walls, throughout bilateral lungs, which is nonspecific. Findings are most commonly seen with bronchitis or reactive airway disease, such as asthma. Redemonstration of mild-to-moderate upper lobe predominant centrilobular emphysematous changes. Redemonstration of irregular opacity in the central right upper lobe with associated bronchiectasis and peripheral scarring. The opacity currently measures 1.9 x 3.1 cm orthogonally on axial plane, essentially similar to the prior study, when remeasured in similar fashion. There are dependent changes in bilateral  lungs. No new mass or consolidation. No pleural effusion or pneumothorax. There is a stable 5-6 mm nodule along the left major fissure (series 4, image 109), nonspecific but favored to represent intra fissural lymph node. There are new, 2 part solid nodules in the left lung lower lobe (series 4, image 109, measuring 7 x 9 and series 4, image 117. These are indeterminate in etiology but favored to represent sequela of infection or inflammation. Attention on short-term follow-up exam is recommended. There is a stable subpleural sub 4 mm nodule in the middle lobe (series 4, image 95). There is continued decrease in the size of previously described subpleural opacity in the peripheral right apex (  series 4, image 38). Musculoskeletal: A CT Port-a-Cath is seen in the right upper chest wall with the catheter terminating in the cavo-atrial junction region. Asymmetric moderate right-sided gynecomastia noted. Visualized soft tissues of the chest wall are otherwise grossly unremarkable. There is heterogeneous mixed sclerotic/lytic area in the C7 vertebral body, essentially similar to the prior study, highly concerning for metastases. No pathological fracture. There is stable moderate anterior wedging deformity of T8 vertebrae and superior endplate deformities of T10 and T11 vertebrae, essentially similar to the prior study. T8 through T11 vertebrae also exhibit mixed sclerotic foci, compatible with metastasis. Stable appearance of manubrium of the sternum. No new pathological fracture seen. There are multiple old healed bilateral rib fractures. There are mild multilevel degenerative changes in the visualized spine. CT ABDOMEN PELVIS FINDINGS Hepatobiliary: The liver is normal in size. Non-cirrhotic configuration. No suspicious mass. There is persistent subcentimeter sized ill-defined hypoattenuating focus (series 2, image 71), which is too small to adequately characterize. No intrahepatic or extrahepatic bile duct dilation. No calcified gallstones. Normal gallbladder wall thickness. No pericholecystic inflammatory changes. Pancreas: Unremarkable. No pancreatic ductal dilatation or surrounding inflammatory changes. Spleen: Within normal limits. No focal lesion. Adrenals/Urinary Tract: Adrenal glands are unremarkable. There is a 2 mm punctate nonobstructing calculus in the left kidney lower pole calyx. No other nephroureterolithiasis on either side. There are several bilateral simple renal cysts with largest arising from the right kidney interpolar region, posteromedially measuring up to 1.6 x 2.7 cm. Unremarkable urinary bladder. Stomach/Bowel: No disproportionate dilation of the small or large bowel loops. No  evidence of abnormal bowel wall thickening or inflammatory changes. The appendix is unremarkable. Vascular/Lymphatic: No ascites or pneumoperitoneum. However, there is nonspecific fat stranding and trace amount of fluid along the bilateral paracolic gutters, similar to the prior study-of unknown etiology but of minimal clinical significance. No abdominal or pelvic lymphadenopathy, by size criteria. No aneurysmal dilation of the major abdominal arteries. There are marked peripheral atherosclerotic vascular calcifications of the aorta and its major branches. Reproductive: Normal size prostate. Symmetric seminal vesicles. Other: There are fat containing umbilical and bilateral inguinal hernias. The soft tissues and abdominal wall are otherwise unremarkable. Musculoskeletal: Redemonstration of mixed sclerotic/lytic lesion involving bilateral iliac bones, left more than right as well as L5 vertebral body, essentially similar to the prior study. There is fusion of L3-4 vertebral bodies. No new pathological fracture. There are moderate multilevel degenerative changes in the visualized spine. IMPRESSION: 1. Redemonstration of irregular opacity in the central right upper lobe with associated bronchiectasis and peripheral scarring, essentially similar to the prior study. 2. There are two, new, part solid nodules in the left lung lower lobe, which are indeterminate in etiology but favored to represent sequela of infection or inflammation. Attention on short-term follow-up examination is recommended. 3. Redemonstration of mixed  sclerotic/lytic lesions in the C7, T8, T10, T11, L5 vertebral bodies, manubrium of sternum and bilateral iliac bones, essentially similar to the prior study. No new pathological fracture. 4. Multiple other nonacute observations, as described above. Aortic Atherosclerosis (ICD10-I70.0) and Emphysema (ICD10-J43.9). Electronically Signed   By: Ree Molt M.D.   On: 09/19/2024 08:41        ASSESSMENT AND PLAN: This is a very pleasant 59 years old white male with stage IV (T3, N2, M1 C) non-small cell lung cancer, adenocarcinoma presented with large right upper lobe lung mass in addition to right hilar and subcarinal lymphadenopathy in addition to multiple metastatic bone lesions involving the pelvis as well as the lower thoracic and lumbar spines in addition to metastatic disease to the pelvic muscles diagnosed in February 2024.  Molecular studies showed positive KRAS G12C mutation and PD-L1 expression of 98%. The patient is here today to start the first cycle of systemic chemotherapy with carboplatin  for AUC of 5, Alimta  500 Mg/M2 and Keytruda  200 Mg IV every 3 weeks.  First dose November 14, 2022.  Status post 32 cycles.  Starting from cycle #5 the patient is on maintenance treatment with Alimta  and Keytruda  every 3 weeks.  He has been tolerating this treatment fairly well. He had repeat CT scan of the chest, abdomen and pelvis performed recently.  I personally independently reviewed the scan and discussed the result with the patient and his sister.  His scan showed no concerning findings for disease progression but there was 2 new part solid nodules in the left lower lobe that are indeterminate and likely inflammatory in origin. Assessment and Plan Assessment & Plan Stage IV non-small cell lung adenocarcinoma with KRAS G12C mutation and high PD-L1 expression He remains clinically stable nearly two years post-diagnosis, with no evidence of disease progression on recent CT imaging. Only tiny pulmonary nodules persist, likely representing inflammation. Maintenance immunotherapy continues. - Reviewed recent CT scan of chest, abdomen, and pelvis for restaging. - Provided copy of imaging results to him. - Continued maintenance pemetrexed  (Alimta ) and pembrolizumab  (Keytruda ) every three weeks. - Scheduled follow-up in three weeks.  Chronic cancer-related pain He experiences  persistent but less severe chronic back pain compared to initial presentation. Pain is currently manageable.  Chronic respiratory failure with hypoxia Chronic respiratory failure with hypoxia. He was advised to call immediately if he has any other concerning symptoms in the interval.   The patient voices understanding of current disease status and treatment options and is in agreement with the current care plan.  All questions were answered. The patient knows to call the clinic with any problems, questions or concerns. We can certainly see the patient much sooner if necessary.  The total time spent in the appointment was 30 minutes including review of chart and various tests results, discussions about plan of care and coordination of care plan .   Disclaimer: This note was dictated with voice recognition software. Similar sounding words can inadvertently be transcribed and may not be corrected upon review.        "

## 2024-09-26 LAB — T4: T4, Total: 6.9 ug/dL (ref 4.5–12.0)

## 2024-10-03 ENCOUNTER — Other Ambulatory Visit (HOSPITAL_BASED_OUTPATIENT_CLINIC_OR_DEPARTMENT_OTHER): Payer: Self-pay | Admitting: Pulmonary Disease

## 2024-10-03 ENCOUNTER — Other Ambulatory Visit: Payer: Self-pay

## 2024-10-03 ENCOUNTER — Other Ambulatory Visit: Payer: Self-pay | Admitting: Nurse Practitioner

## 2024-10-03 ENCOUNTER — Other Ambulatory Visit (HOSPITAL_COMMUNITY): Payer: Self-pay

## 2024-10-03 DIAGNOSIS — G893 Neoplasm related pain (acute) (chronic): Secondary | ICD-10-CM

## 2024-10-03 DIAGNOSIS — Z515 Encounter for palliative care: Secondary | ICD-10-CM

## 2024-10-03 DIAGNOSIS — C3490 Malignant neoplasm of unspecified part of unspecified bronchus or lung: Secondary | ICD-10-CM

## 2024-10-04 ENCOUNTER — Other Ambulatory Visit (HOSPITAL_COMMUNITY): Payer: Self-pay

## 2024-10-04 MED ORDER — OXYCODONE-ACETAMINOPHEN 7.5-325 MG PO TABS
1.0000 | ORAL_TABLET | Freq: Four times a day (QID) | ORAL | 0 refills | Status: AC | PRN
  Filled 2024-10-04: qty 60, 15d supply, fill #0

## 2024-10-04 MED ORDER — OXYCODONE HCL ER 20 MG PO T12A
20.0000 mg | EXTENDED_RELEASE_TABLET | Freq: Three times a day (TID) | ORAL | 0 refills | Status: AC
  Filled 2024-10-04: qty 90, 30d supply, fill #0

## 2024-10-14 ENCOUNTER — Other Ambulatory Visit: Payer: Self-pay

## 2024-10-14 ENCOUNTER — Other Ambulatory Visit (HOSPITAL_COMMUNITY): Payer: Self-pay

## 2024-10-14 ENCOUNTER — Other Ambulatory Visit: Payer: Self-pay | Admitting: Nurse Practitioner

## 2024-10-14 ENCOUNTER — Other Ambulatory Visit (HOSPITAL_BASED_OUTPATIENT_CLINIC_OR_DEPARTMENT_OTHER): Payer: Self-pay | Admitting: Pulmonary Disease

## 2024-10-14 DIAGNOSIS — J309 Allergic rhinitis, unspecified: Secondary | ICD-10-CM

## 2024-10-14 MED ORDER — ALBUTEROL SULFATE HFA 108 (90 BASE) MCG/ACT IN AERS
2.0000 | INHALATION_SPRAY | Freq: Four times a day (QID) | RESPIRATORY_TRACT | 3 refills | Status: AC
  Filled 2024-10-14: qty 6.7, 25d supply, fill #0

## 2024-10-14 MED ORDER — FLUTICASONE PROPIONATE 50 MCG/ACT NA SUSP
1.0000 | Freq: Every day | NASAL | 2 refills | Status: AC
  Filled 2024-10-14: qty 16, 30d supply, fill #0

## 2024-10-16 ENCOUNTER — Encounter: Payer: Self-pay | Admitting: Nurse Practitioner

## 2024-10-16 ENCOUNTER — Inpatient Hospital Stay

## 2024-10-16 ENCOUNTER — Inpatient Hospital Stay: Admitting: Nurse Practitioner

## 2024-10-16 ENCOUNTER — Inpatient Hospital Stay: Admitting: Internal Medicine

## 2024-10-16 VITALS — BP 156/83 | HR 71 | Temp 98.0°F | Resp 18

## 2024-10-16 VITALS — BP 136/63 | HR 77 | Temp 98.2°F | Resp 17 | Ht 66.0 in | Wt 205.0 lb

## 2024-10-16 DIAGNOSIS — R530 Neoplastic (malignant) related fatigue: Secondary | ICD-10-CM | POA: Diagnosis not present

## 2024-10-16 DIAGNOSIS — K5903 Drug induced constipation: Secondary | ICD-10-CM

## 2024-10-16 DIAGNOSIS — C3490 Malignant neoplasm of unspecified part of unspecified bronchus or lung: Secondary | ICD-10-CM | POA: Diagnosis not present

## 2024-10-16 DIAGNOSIS — Z5112 Encounter for antineoplastic immunotherapy: Secondary | ICD-10-CM | POA: Diagnosis not present

## 2024-10-16 DIAGNOSIS — Z95828 Presence of other vascular implants and grafts: Secondary | ICD-10-CM

## 2024-10-16 DIAGNOSIS — K5900 Constipation, unspecified: Secondary | ICD-10-CM | POA: Diagnosis not present

## 2024-10-16 DIAGNOSIS — G893 Neoplasm related pain (acute) (chronic): Secondary | ICD-10-CM

## 2024-10-16 DIAGNOSIS — Z515 Encounter for palliative care: Secondary | ICD-10-CM | POA: Diagnosis not present

## 2024-10-16 DIAGNOSIS — F17210 Nicotine dependence, cigarettes, uncomplicated: Secondary | ICD-10-CM

## 2024-10-16 LAB — CBC WITH DIFFERENTIAL (CANCER CENTER ONLY)
Abs Immature Granulocytes: 0.01 10*3/uL (ref 0.00–0.07)
Basophils Absolute: 0 10*3/uL (ref 0.0–0.1)
Basophils Relative: 1 %
Eosinophils Absolute: 0.3 10*3/uL (ref 0.0–0.5)
Eosinophils Relative: 6 %
HCT: 37.3 % — ABNORMAL LOW (ref 39.0–52.0)
Hemoglobin: 11.8 g/dL — ABNORMAL LOW (ref 13.0–17.0)
Immature Granulocytes: 0 %
Lymphocytes Relative: 13 %
Lymphs Abs: 0.6 10*3/uL — ABNORMAL LOW (ref 0.7–4.0)
MCH: 30.5 pg (ref 26.0–34.0)
MCHC: 31.6 g/dL (ref 30.0–36.0)
MCV: 96.4 fL (ref 80.0–100.0)
Monocytes Absolute: 0.4 10*3/uL (ref 0.1–1.0)
Monocytes Relative: 9 %
Neutro Abs: 3.5 10*3/uL (ref 1.7–7.7)
Neutrophils Relative %: 71 %
Platelet Count: 171 10*3/uL (ref 150–400)
RBC: 3.87 MIL/uL — ABNORMAL LOW (ref 4.22–5.81)
RDW: 15.5 % (ref 11.5–15.5)
WBC Count: 4.9 10*3/uL (ref 4.0–10.5)
nRBC: 0 % (ref 0.0–0.2)

## 2024-10-16 LAB — CMP (CANCER CENTER ONLY)
ALT: 8 U/L (ref 0–44)
AST: 16 U/L (ref 15–41)
Albumin: 3.9 g/dL (ref 3.5–5.0)
Alkaline Phosphatase: 79 U/L (ref 38–126)
Anion gap: 5 (ref 5–15)
BUN: 16 mg/dL (ref 6–20)
CO2: 37 mmol/L — ABNORMAL HIGH (ref 22–32)
Calcium: 9 mg/dL (ref 8.9–10.3)
Chloride: 100 mmol/L (ref 98–111)
Creatinine: 0.94 mg/dL (ref 0.61–1.24)
GFR, Estimated: >60 mL/min
Glucose, Bld: 102 mg/dL — ABNORMAL HIGH (ref 70–99)
Potassium: 4.5 mmol/L (ref 3.5–5.1)
Sodium: 142 mmol/L (ref 135–145)
Total Bilirubin: 0.3 mg/dL (ref 0.0–1.2)
Total Protein: 6.8 g/dL (ref 6.5–8.1)

## 2024-10-16 MED ORDER — SODIUM CHLORIDE 0.9 % IV SOLN
500.0000 mg/m2 | Freq: Once | INTRAVENOUS | Status: AC
  Administered 2024-10-16: 1000 mg via INTRAVENOUS
  Filled 2024-10-16: qty 40

## 2024-10-16 MED ORDER — SODIUM CHLORIDE 0.9 % IV SOLN: Freq: Once | INTRAVENOUS | Status: AC

## 2024-10-16 MED ORDER — PROCHLORPERAZINE MALEATE 10 MG PO TABS
10.0000 mg | ORAL_TABLET | Freq: Once | ORAL | Status: AC
  Administered 2024-10-16: 10 mg via ORAL
  Filled 2024-10-16: qty 1

## 2024-10-16 MED ORDER — ZOLEDRONIC ACID 4 MG/100ML IV SOLN
4.0000 mg | Freq: Once | INTRAVENOUS | Status: AC
  Administered 2024-10-16: 4 mg via INTRAVENOUS
  Filled 2024-10-16: qty 100

## 2024-10-16 MED ORDER — SODIUM CHLORIDE 0.9 % IV SOLN
200.0000 mg | Freq: Once | INTRAVENOUS | Status: AC
  Administered 2024-10-16: 200 mg via INTRAVENOUS
  Filled 2024-10-16: qty 200

## 2024-10-16 NOTE — Patient Instructions (Signed)
 [facility] - A DEPT OF [facility]  Discharge Instructions: Thank you for choosing [facility] to provide your oncology and hematology care.   If you have a lab appointment with the Cancer Center, please go directly to the Cancer Center and check in at the registration area.   Wear comfortable clothing and clothing appropriate for easy access to any Portacath or PICC line.   We strive to give you quality time with your provider. You may need to reschedule your appointment if you arrive late (15 or more minutes).  Arriving late affects you and other patients whose appointments are after yours.  Also, if you miss three or more appointments without notifying the office, you may be dismissed from the clinic at the providers discretion.      For prescription refill requests, have your pharmacy contact our office and allow 72 hours for refills to be completed.    Today you received the following chemotherapy and/or immunotherapy agents:  PEMEtrexed  Disodium (ALIMTA )  pembrolizumab  (KEYTRUDA )    To help prevent nausea and vomiting after your treatment, we encourage you to take your nausea medication as directed.  BELOW ARE SYMPTOMS THAT SHOULD BE REPORTED IMMEDIATELY: *FEVER GREATER THAN 100.4 F (38 C) OR HIGHER *CHILLS OR SWEATING *NAUSEA AND VOMITING THAT IS NOT CONTROLLED WITH YOUR NAUSEA MEDICATION *UNUSUAL SHORTNESS OF BREATH *UNUSUAL BRUISING OR BLEEDING *URINARY PROBLEMS (pain or burning when urinating, or frequent urination) *BOWEL PROBLEMS (unusual diarrhea, constipation, pain near the anus) TENDERNESS IN MOUTH AND THROAT WITH OR WITHOUT PRESENCE OF ULCERS (sore throat, sores in mouth, or a toothache) UNUSUAL RASH, SWELLING OR PAIN  UNUSUAL VAGINAL DISCHARGE OR ITCHING   Items with * indicate a potential emergency and should be followed up as soon as possible or go to the Emergency Department if any problems should occur.  Please  show the CHEMOTHERAPY ALERT CARD or IMMUNOTHERAPY ALERT CARD at check-in to the Emergency Department and triage nurse.  Should you have questions after your visit or need to cancel or reschedule your appointment, please contact [facility] - A DEPT OF [facility]  Dept: [phone]  and follow the prompts.  Office hours are 8:00 a.m. to 4:30 p.m. Monday - Friday. Please note that voicemails left after 4:00 p.m. may not be returned until the following business day.  We are closed weekends and major holidays. You have access to a nurse at all times for urgent questions. Please call the main number to the clinic Dept: [phone] and follow the prompts.   For any non-urgent questions, you may also contact your provider using MyChart. We now offer e-Visits for anyone 78 and older to request care online for non-urgent symptoms. For details visit [email or web address].   Also download the MyChart app! Go to the app store, search MyChart, open the app, select [facility], and log in with your MyChart username and password.

## 2024-10-16 NOTE — Progress Notes (Signed)
 "     [facility] [phone]   [phone]  OFFICE PROGRESS NOTE  Maxwell Lenis, MD [address] [place] [address]  DIAGNOSIS:  1) Stage IV (T3, N2, M1 C) non-small cell lung cancer, adenocarcinoma presented with large right upper lobe lung mass in addition to right hilar and subcarinal lymphadenopathy in addition to multiple metastatic bone lesions involving the pelvis as well as the lower thoracic and lumbar spines in addition to metastatic disease to the pelvic muscles diagnosed in February 2024.  2) deep venous thrombosis of the right lower extremity diagnosed on December 18, 2022  Detected Alteration(s) / Biomarker(s) Associated FDA-approved therapies Clinical Trial Availability% cfDNA or Amplification  KRAS G12C approved by FDA Adagrasib, Sotorasib Yes 29.3%  CDK4 Amplification None Yes High (+++)Plasma Copy Number: 8.0  PD-L1 expression 98%  PRIOR THERAPY: None  CURRENT THERAPY:  1) Systemic chemotherapy with carboplatin  for AUC of 5, Alimta  500 Mg/M2 and Keytruda  200 Mg IV every 3 weeks.  First dose November 14, 2022.  Status post 33 cycles.  Starting from cycle #5 the patient is on maintenance treatment with Alimta  and Keytruda  every 3 weeks. 2) Eliquis  10 mg p.o. twice daily for 7 days followed by 5 mg p.o. twice daily.  First dose 12/26/2022.  INTERVAL HISTORY: Maxwell Grant 59 y.o. male returns to the clinic today for follow-up visit after by his sister. Discussed the use of AI scribe software for clinical note transcription with the patient, who gave verbal consent to proceed.  History of Present Illness Maxwell Grant is a 59 year old male with stage IV non-small cell lung adenocarcinoma who presents for evaluation prior to cycle 34 of maintenance chemoimmunotherapy.  He was diagnosed in February 2024 with stage IV non-small cell lung adenocarcinoma, KRAS G12C mutation, and high PD-L1 expression (98%), with  metastases to bone and pelvic muscles. Initial treatment included four cycles of carboplatin , Alimta , and Keytruda  every three weeks, followed by maintenance Alimta  and Keytruda  since cycle five. He has completed 33 cycles and is here for evaluation prior to cycle 34.  He continues to experience chronic dyspnea, requiring supplemental oxygen at 2 liters per minute. This morning, his oxygen device malfunctioned but was restored, with oxygen saturation at 94%. He denies increased chest pain or nausea since the last visit.  The patient reports chronic back pain, with no new pain issues reported. Medication questions from his family regarding his regimen were addressed, confirming discontinuation of carboplatin  and continuation of maintenance Alimta  and Keytruda .  Sep 25, 2024: Routine oncology follow-up and restaging for stage IV non-small cell lung adenocarcinoma; completed 32 cycles of carboplatin , pemetrexed , and pembrolizumab , currently on maintenance pemetrexed  and pembrolizumab  every 3 weeks. Recent CT imaging showed stable disease with no progression, two new part-solid nodules in left lower lobe likely inflammatory. Persistent fatigue and chronic back pain managed with analgesics; remains on supplemental oxygen at home, ECOG 1.    MEDICAL HISTORY: Past Medical History:  Diagnosis Date   CHF (congestive heart failure) (HCC)    COPD (chronic obstructive pulmonary disease) (HCC)    Diabetes mellitus without complication (HCC)    Dyspnea    Hypertension    Lung cancer (HCC) 10/2022   Right leg DVT (HCC) 12/21/2022    ALLERGIES:  is allergic to statins, aspirin, flexeril [cyclobenzaprine], and penicillins.  MEDICATIONS:  Current Outpatient Medications  Medication Sig Dispense Refill   Accu-Chek FastClix Lancets MISC Use to test blood sugar 4  times a day 102 each 0   albuterol  (PROVENTIL ) (2.5 MG/3ML) 0.083% nebulizer solution Inhale 3 mL (2.5 mg total) by nebulization every 6 (six) hours  as needed for wheezing. 360 mL 3   albuterol  (VENTOLIN  HFA) 108 (90 Base) MCG/ACT inhaler Inhale 1-2 puffs into the lungs every 6 (six) hours as needed for wheezing or shortness of breath. 18 g 1   albuterol  (VENTOLIN  HFA) 108 (90 Base) MCG/ACT inhaler Inhale 2 puffs every 6 (six) hours as needed for wheezing. 54 g 3   apixaban  (ELIQUIS ) 5 MG TABS tablet Take 1 tablet (5 mg total) by mouth 2 (two) times daily. 60 tablet 5   Azelastine  HCl 137 MCG/SPRAY SOLN Place 2 sprays into nostrils 2 (two) times daily. 30 mL 12   benzonatate  (TESSALON ) 100 MG capsule Take 1 capsule (100 mg total) by mouth 3 (three) times daily as needed. 30 capsule 2   fluticasone  (FLONASE ) 50 MCG/ACT nasal spray Place 1 spray into both nostrils daily. 16 g 2   Fluticasone -Umeclidin-Vilant (TRELEGY ELLIPTA ) 100-62.5-25 MCG/ACT AEPB Inhale 1 puff into the lungs daily in the afternoon. 60 each 5   folic acid  (FOLVITE ) 1 MG tablet Take 1 tablet (1 mg total) by mouth daily. 90 tablet 1   furosemide  (LASIX ) 20 MG tablet Take 1 tablet (20 mg total) by mouth daily for 1 week. 7 tablet 0   gabapentin  (NEURONTIN ) 300 MG capsule Take 2 capsules (600mg ) in the morning, 1 capsule (300 mg ) midday, and 2 capsules at bedtime (600mg ). 150 capsule 2   glimepiride  (AMARYL ) 2 MG tablet Take 2 mg by mouth in the morning.     glimepiride  (AMARYL ) 2 MG tablet Take one tablet (2 mg dose) by mouth 30 (thirty) minutes before breakfast. 90 tablet 1   guaiFENesin  (MUCINEX ) 600 MG 12 hr tablet Take 1,200 mg by mouth 2 (two) times daily.     lidocaine -prilocaine  (EMLA ) cream Apply to [facility] Prior to Access as needed 30 g 6   meloxicam  (MOBIC ) 7.5 MG tablet Take 1 tablet (7.5 mg total) by mouth 2 (two) times daily. 60 tablet 3   OHTUVAYRE  3 MG/2.5ML SUSP Inhale 3 mg into the lungs 2 (two) times daily.     ondansetron  (ZOFRAN ) 8 MG tablet Take 1 tablet (8 mg) by mouth every 8 hours as needed for nausea or vomiting. 30 tablet 2   OVER THE COUNTER MEDICATION  Take 1 tablet by mouth daily as needed (constipation).  Stool softener     oxyCODONE  (OXYCONTIN ) 20 mg 12 hr tablet Take 1 tablet (20 mg total) by mouth every 8 (eight) hours. 90 tablet 0   oxyCODONE -acetaminophen  (PERCOCET) 7.5-325 MG tablet Take 1 tablet by mouth every 6 (six) hours as needed for severe pain (pain score 7-10). 60 tablet 0   prochlorperazine  (COMPAZINE ) 10 MG tablet Take 1 tablet (10 mg total) by mouth every 6 (six) hours as needed for nausea or vomiting. 30 tablet 0   TRELEGY ELLIPTA  100-62.5-25 MCG/ACT AEPB Inhale 1 puff once daily. 180 each 3   No current facility-administered medications for this visit.    SURGICAL HISTORY:  Past Surgical History:  Procedure Laterality Date   BRONCHIAL NEEDLE ASPIRATION BIOPSY  10/30/2022   Procedure: BRONCHIAL NEEDLE ASPIRATION BIOPSIES;  Surgeon: Shelah Lamar RAMAN, MD;  Location: [facility] ENDOSCOPY;  Service: Cardiopulmonary;;   IR IMAGING GUIDED PORT INSERTION  11/17/2022   NO PAST SURGERIES     RADIOLOGY WITH ANESTHESIA  10/30/2022   Procedure: MRI  WITH ANESTHESIA W/WO CONTRAST;  Surgeon: Shelah Lamar RAMAN, MD;  Location: [facility] ENDOSCOPY;  Service: Cardiopulmonary;;   VIDEO BRONCHOSCOPY WITH ENDOBRONCHIAL ULTRASOUND Right 10/30/2022   Procedure: VIDEO BRONCHOSCOPY WITH ENDOBRONCHIAL ULTRASOUND;  Surgeon: Shelah Lamar RAMAN, MD;  Location: [facility] ENDOSCOPY;  Service: Cardiopulmonary;  Laterality: Right;    REVIEW OF SYSTEMS:  A comprehensive review of systems was negative except for: Respiratory: positive for dyspnea on exertion Musculoskeletal: positive for back pain   PHYSICAL EXAMINATION: General appearance: alert, cooperative, fatigued, and no distress Head: Normocephalic, without obvious abnormality, atraumatic Neck: no adenopathy, no JVD, supple, symmetrical, trachea midline, and thyroid  not enlarged, symmetric, no tenderness/mass/nodules Lymph nodes: Cervical, supraclavicular, and axillary nodes normal. Resp: clear to auscultation  bilaterally Back: symmetric, no curvature. ROM normal. No CVA tenderness. Cardio: regular rate and rhythm, S1, S2 normal, no murmur, click, rub or gallop GI: soft, non-tender; bowel sounds normal; no masses,  no organomegaly Extremities: extremities normal, atraumatic, no cyanosis or edema  ECOG PERFORMANCE STATUS: 1 - Symptomatic but completely ambulatory  Blood pressure 136/63, pulse 77, temperature 98.2 F (36.8 C), temperature source Temporal, resp. rate 17, height 5' 6 (1.676 m), weight 205 lb (93 kg), SpO2 94%.  LABORATORY DATA: Lab Results  Component Value Date   WBC 4.9 10/16/2024   HGB 11.8 (L) 10/16/2024   HCT 37.3 (L) 10/16/2024   MCV 96.4 10/16/2024   PLT 171 10/16/2024      Chemistry      Component Value Date/Time   NA 142 10/16/2024 0754   K 4.5 10/16/2024 0754   CL 100 10/16/2024 0754   CO2 37 (H) 10/16/2024 0754   BUN 16 10/16/2024 0754   CREATININE 0.94 10/16/2024 0754      Component Value Date/Time   CALCIUM  9.0 10/16/2024 0754   ALKPHOS 79 10/16/2024 0754   AST 16 10/16/2024 0754   ALT 8 10/16/2024 0754   BILITOT 0.3 10/16/2024 0754       RADIOGRAPHIC STUDIES: CT CHEST ABDOMEN PELVIS W CONTRAST Result Date: 09/19/2024 CLINICAL DATA:  Non-small cell lung cancer (NSCLC), staging. * Tracking Code: BO * EXAM: CT CHEST, ABDOMEN, AND PELVIS WITH CONTRAST TECHNIQUE: Multidetector CT imaging of the chest, abdomen and pelvis was performed following the standard protocol during bolus administration of intravenous contrast. RADIATION DOSE REDUCTION: This exam was performed according to the departmental dose-optimization program which includes automated exposure control, adjustment of the mA and/or kV according to patient size and/or use of iterative reconstruction technique. CONTRAST:  OMNIPAQUE  IOHEXOL  300 MG/ML  SOLN COMPARISON:  CT scan chest, abdomen and pelvis from 07/08/2024. FINDINGS: CT CHEST FINDINGS Cardiovascular: Normal cardiac size. No pericardial  effusion. No aortic aneurysm. There are coronary artery calcifications, in keeping with coronary artery disease. There are also mild-to-moderate peripheral atherosclerotic vascular calcifications of thoracic aorta and its major branches. Mediastinum/Nodes: Visualized thyroid  gland appears grossly unremarkable. No solid / cystic mediastinal masses. The esophagus is nondistended precluding optimal assessment. There is mild circumferential thickening of the lower thoracic esophagus, which is most likely seen in the settings of chronic gastroesophageal reflux disease versus esophagitis. There are few mildly prominent mediastinal lymph nodes, which do not meet the size criteria for lymphadenopathy and appear grossly similar to the prior study, favoring benign etiology. No axillary or hilar lymphadenopathy by size criteria. Lungs/Pleura: The central tracheo-bronchial tree is patent. There is mild, smooth, circumferential thickening of the segmental and subsegmental bronchial walls, throughout bilateral lungs, which is nonspecific. Findings are most commonly seen with bronchitis  or reactive airway disease, such as asthma. Redemonstration of mild-to-moderate upper lobe predominant centrilobular emphysematous changes. Redemonstration of irregular opacity in the central right upper lobe with associated bronchiectasis and peripheral scarring. The opacity currently measures 1.9 x 3.1 cm orthogonally on axial plane, essentially similar to the prior study, when remeasured in similar fashion. There are dependent changes in bilateral lungs. No new mass or consolidation. No pleural effusion or pneumothorax. There is a stable 5-6 mm nodule along the left major fissure (series 4, image 109), nonspecific but favored to represent intra fissural lymph node. There are new, 2 part solid nodules in the left lung lower lobe (series 4, image 109, measuring 7 x 9 and series 4, image 117. These are indeterminate in etiology but favored to  represent sequela of infection or inflammation. Attention on short-term follow-up exam is recommended. There is a stable subpleural sub 4 mm nodule in the middle lobe (series 4, image 95). There is continued decrease in the size of previously described subpleural opacity in the peripheral right apex ( series 4, image 38). Musculoskeletal: A CT Port-a-Cath is seen in the right upper chest wall with the catheter terminating in the cavo-atrial junction region. Asymmetric moderate right-sided gynecomastia noted. Visualized soft tissues of the chest wall are otherwise grossly unremarkable. There is heterogeneous mixed sclerotic/lytic area in the C7 vertebral body, essentially similar to the prior study, highly concerning for metastases. No pathological fracture. There is stable moderate anterior wedging deformity of T8 vertebrae and superior endplate deformities of T10 and T11 vertebrae, essentially similar to the prior study. T8 through T11 vertebrae also exhibit mixed sclerotic foci, compatible with metastasis. Stable appearance of manubrium of the sternum. No new pathological fracture seen. There are multiple old healed bilateral rib fractures. There are mild multilevel degenerative changes in the visualized spine. CT ABDOMEN PELVIS FINDINGS Hepatobiliary: The liver is normal in size. Non-cirrhotic configuration. No suspicious mass. There is persistent subcentimeter sized ill-defined hypoattenuating focus (series 2, image 71), which is too small to adequately characterize. No intrahepatic or extrahepatic bile duct dilation. No calcified gallstones. Normal gallbladder wall thickness. No pericholecystic inflammatory changes. Pancreas: Unremarkable. No pancreatic ductal dilatation or surrounding inflammatory changes. Spleen: Within normal limits. No focal lesion. Adrenals/Urinary Tract: Adrenal glands are unremarkable. There is a 2 mm punctate nonobstructing calculus in the left kidney lower pole calyx. No other  nephroureterolithiasis on either side. There are several bilateral simple renal cysts with largest arising from the right kidney interpolar region, posteromedially measuring up to 1.6 x 2.7 cm. Unremarkable urinary bladder. Stomach/Bowel: No disproportionate dilation of the small or large bowel loops. No evidence of abnormal bowel wall thickening or inflammatory changes. The appendix is unremarkable. Vascular/Lymphatic: No ascites or pneumoperitoneum. However, there is nonspecific fat stranding and trace amount of fluid along the bilateral paracolic gutters, similar to the prior study-of unknown etiology but of minimal clinical significance. No abdominal or pelvic lymphadenopathy, by size criteria. No aneurysmal dilation of the major abdominal arteries. There are marked peripheral atherosclerotic vascular calcifications of the aorta and its major branches. Reproductive: Normal size prostate. Symmetric seminal vesicles. Other: There are fat containing umbilical and bilateral inguinal hernias. The soft tissues and abdominal wall are otherwise unremarkable. Musculoskeletal: Redemonstration of mixed sclerotic/lytic lesion involving bilateral iliac bones, left more than right as well as L5 vertebral body, essentially similar to the prior study. There is fusion of L3-4 vertebral bodies. No new pathological fracture. There are moderate multilevel degenerative changes in the visualized spine. IMPRESSION: 1.  Redemonstration of irregular opacity in the central right upper lobe with associated bronchiectasis and peripheral scarring, essentially similar to the prior study. 2. There are two, new, part solid nodules in the left lung lower lobe, which are indeterminate in etiology but favored to represent sequela of infection or inflammation. Attention on short-term follow-up examination is recommended. 3. Redemonstration of mixed sclerotic/lytic lesions in the C7, T8, T10, T11, L5 vertebral bodies, manubrium of sternum and  bilateral iliac bones, essentially similar to the prior study. No new pathological fracture. 4. Multiple other nonacute observations, as described above. Aortic Atherosclerosis (ICD10-I70.0) and Emphysema (ICD10-J43.9). Electronically Signed   By: Ree Molt M.D.   On: 09/19/2024 08:41       ASSESSMENT AND PLAN: This is a very pleasant 59 years old white male with stage IV (T3, N2, M1 C) non-small cell lung cancer, adenocarcinoma presented with large right upper lobe lung mass in addition to right hilar and subcarinal lymphadenopathy in addition to multiple metastatic bone lesions involving the pelvis as well as the lower thoracic and lumbar spines in addition to metastatic disease to the pelvic muscles diagnosed in February 2024.  Molecular studies showed positive KRAS G12C mutation and PD-L1 expression of 98%. The patient is here today to start the first cycle of systemic chemotherapy with carboplatin  for AUC of 5, Alimta  500 Mg/M2 and Keytruda  200 Mg IV every 3 weeks.  First dose November 14, 2022.  Status post 33 cycles.  Starting from cycle #5 the patient is on maintenance treatment with Alimta  and Keytruda  every 3 weeks.  He has been tolerating this treatment fairly well. Assessment and Plan Assessment & Plan Stage IV non-small cell lung cancer, adenocarcinoma with KRAS G12C mutation and high PD-L1 expression Metastatic adenocarcinoma with KRAS G12C mutation and high PD-L1 expression, diagnosed February 2024. He has completed 33 cycles of systemic therapy (induction with carboplatin , pemetrexed , and pembrolizumab , followed by maintenance pemetrexed  and pembrolizumab ). - Proceed with cycle 34 of maintenance pemetrexed  and pembrolizumab . - Clarified current medication regimen with family. - Routine oncology follow-up in 3 weeks.  Chronic respiratory failure with hypoxia Chronic respiratory failure secondary to metastatic lung cancer, requiring supplemental oxygen at 2 L/min.  Chronic  cancer-related pain Chronic pain related to metastatic disease, managed by the palliative care team. The patient was advised to call immediately if he has any concerning symptoms in the interval.   The patient voices understanding of current disease status and treatment options and is in agreement with the current care plan.  All questions were answered. The patient knows to call the clinic with any problems, questions or concerns. We can certainly see the patient much sooner if necessary.  The total time spent in the appointment was 20 minutes including review of chart and various tests results, discussions about plan of care and coordination of care plan .   Disclaimer: This note was dictated with voice recognition software. Similar sounding words can inadvertently be transcribed and may not be corrected upon review.        "

## 2024-10-16 NOTE — Progress Notes (Signed)
 "    Palliative Medicine [facility]  [phone] [phone]   Name: Maxwell Grant Date: 10/16/2024 MRN: [id number]  DOB: 25-Oct-1965  Patient Care Team: Pura Lenis, MD as PCP - General (Family Medicine)   INTERVAL HISTORY: Maxwell Grant is a 59 y.o. male with oncologic medical history including new diagnosed non-small cell lung cancer (10/2022), as well as a history of COPD, diabetes mellitus, hypertension, dyslipidemia as well as history of osteomyelitis of the back.  Palliative ask to see for symptom and pain management and goals of care.   SOCIAL HISTORY:     reports that he has been smoking cigarettes. He has never used smokeless tobacco. He reports current alcohol use of about 1.0 standard drink of alcohol per week. He reports current drug use. Drug: Marijuana.  ADVANCE DIRECTIVES:  None on file   CODE STATUS: Full code  PAST MEDICAL HISTORY: Past Medical History:  Diagnosis Date   CHF (congestive heart failure) (HCC)    COPD (chronic obstructive pulmonary disease) (HCC)    Diabetes mellitus without complication (HCC)    Dyspnea    Hypertension    Lung cancer (HCC) 10/2022   Right leg DVT (HCC) 12/21/2022    ALLERGIES:  is allergic to statins, aspirin, flexeril [cyclobenzaprine], and penicillins.  MEDICATIONS:  Current Outpatient Medications  Medication Sig Dispense Refill   Accu-Chek FastClix Lancets MISC Use to test blood sugar 4 times a day 102 each 0   albuterol  (PROVENTIL ) (2.5 MG/3ML) 0.083% nebulizer solution Inhale 3 mL (2.5 mg total) by nebulization every 6 (six) hours as needed for wheezing. 360 mL 3   albuterol  (VENTOLIN  HFA) 108 (90 Base) MCG/ACT inhaler Inhale 1-2 puffs into the lungs every 6 (six) hours as needed for wheezing or shortness of breath. 18 g 1   albuterol  (VENTOLIN  HFA) 108 (90 Base) MCG/ACT inhaler Inhale 2 puffs every 6 (six) hours as needed for wheezing. 54 g 3   apixaban  (ELIQUIS ) 5 MG TABS tablet  Take 1 tablet (5 mg total) by mouth 2 (two) times daily. 60 tablet 5   Azelastine  HCl 137 MCG/SPRAY SOLN Place 2 sprays into nostrils 2 (two) times daily. 30 mL 12   benzonatate  (TESSALON ) 100 MG capsule Take 1 capsule (100 mg total) by mouth 3 (three) times daily as needed. 30 capsule 2   fluticasone  (FLONASE ) 50 MCG/ACT nasal spray Place 1 spray into both nostrils daily. 16 g 2   Fluticasone -Umeclidin-Vilant (TRELEGY ELLIPTA ) 100-62.5-25 MCG/ACT AEPB Inhale 1 puff into the lungs daily in the afternoon. 60 each 5   folic acid  (FOLVITE ) 1 MG tablet Take 1 tablet (1 mg total) by mouth daily. 90 tablet 1   furosemide  (LASIX ) 20 MG tablet Take 1 tablet (20 mg total) by mouth daily for 1 week. 7 tablet 0   gabapentin  (NEURONTIN ) 300 MG capsule Take 2 capsules (600mg ) in the morning, 1 capsule (300 mg ) midday, and 2 capsules at bedtime (600mg ). 150 capsule 2   glimepiride  (AMARYL ) 2 MG tablet Take 2 mg by mouth in the morning.     glimepiride  (AMARYL ) 2 MG tablet Take one tablet (2 mg dose) by mouth 30 (thirty) minutes before breakfast. 90 tablet 1   guaiFENesin  (MUCINEX ) 600 MG 12 hr tablet Take 1,200 mg by mouth 2 (two) times daily.     lidocaine -prilocaine  (EMLA ) cream Apply to Maxwell Grant Prior to Access as needed 30 g 6   meloxicam  (MOBIC ) 7.5 MG tablet Take 1 tablet (7.5 mg total)  by mouth 2 (two) times daily. 60 tablet 3   OHTUVAYRE  3 MG/2.5ML SUSP Inhale 3 mg into the lungs 2 (two) times daily.     ondansetron  (ZOFRAN ) 8 MG tablet Take 1 tablet (8 mg) by mouth every 8 hours as needed for nausea or vomiting. 30 tablet 2   OVER THE COUNTER MEDICATION Take 1 tablet by mouth daily as needed (constipation).  Stool softener     oxyCODONE  (OXYCONTIN ) 20 mg 12 hr tablet Take 1 tablet (20 mg total) by mouth every 8 (eight) hours. 90 tablet 0   oxyCODONE -acetaminophen  (PERCOCET) 7.5-325 MG tablet Take 1 tablet by mouth every 6 (six) hours as needed for severe pain (pain score 7-10). 60 tablet 0    prochlorperazine  (COMPAZINE ) 10 MG tablet Take 1 tablet (10 mg total) by mouth every 6 (six) hours as needed for nausea or vomiting. 30 tablet 0   TRELEGY ELLIPTA  100-62.5-25 MCG/ACT AEPB Inhale 1 puff once daily. 180 each 3   No current facility-administered medications for this visit.    VITAL SIGNS: There were no vitals taken for this visit. There were no vitals filed for this visit.  Estimated body mass index is 33.09 kg/m as calculated from the following:   Height as of an earlier encounter on 10/16/24: 5' 6 (1.676 m).   Weight as of an earlier encounter on 10/16/24: 205 lb (93 kg).  PERFORMANCE STATUS (ECOG) : 1 - Symptomatic but completely ambulatory  Physical Exam Constitutional:      Appearance: Normal appearance.  Cardiovascular:     Rate and Rhythm: Normal rate.  Pulmonary:     Effort: Pulmonary effort is normal.  Musculoskeletal:     Cervical back: Normal range of motion.  Neurological:     General: No focal deficit present.     Mental Status: He is alert and oriented to person, place, and time.    IMPRESSION: Discussed the use of AI scribe software for clinical note transcription with the patient, who gave verbal consent to proceed.  History of Present Illness Maxwell Grant is a 59 year old male with non-small cell lung cancer who was seen during infusion. Family present. No acute distress noted. Occasional fatigue however remains active.   He has a good appetite and no issues with constipation or diarrhea. He is taking Senokot as needed and is doing well with his medications. Weight is stable at 205lbs.   Maxwell Grant reports his pain is well controlled on current regimen. He is taking OxyContin  every 8 hours and Percocet as needed. Does not require daily use of breakthrough. Gabapentin  twice daily for neuropathic symptoms. No adjustments to current regimen.   All questions and support provided. We will continue to support as needed.   Assessment &  Plan Cancer Related Pain  Pain is well controlled on current regimen. Does not require daily use of breakthrough medication.  -Continue OxyContin  every 8 hours  -Percocet 7.5/325mg  every 6 hrs as needed for breakthrough pain.  - Continue current pain management regimen with oxycodone  and oxycodone -acetaminophen  as needed.  Constipation Controlled with daily use of Miralax and Senna as needed.   I will plan to see patient back in 4-6 weeks. Sooner if needed.   Patient expressed understanding and was in agreement with this plan. He also understands that He can call the clinic at any time with any questions, concerns, or complaints.   Any controlled substances utilized were prescribed in the context of palliative care. PDMP has been  reviewed.   I personally spent a total of 30 minutes in the care of the patient today including preparing to see the patient, getting/reviewing separately obtained history, performing a medically appropriate exam/evaluation, and documenting clinical information in the EHR. Visit consisted of counseling and education dealing with the complex and emotionally intense issues of symptom management and palliative care in the setting of serious and potentially life-threatening illness.  Levon Borer, AGPCNP-BC  Palliative Medicine Team/[facility]    "

## 2024-10-23 ENCOUNTER — Other Ambulatory Visit (HOSPITAL_COMMUNITY): Payer: Self-pay

## 2024-10-23 ENCOUNTER — Other Ambulatory Visit: Payer: Self-pay

## 2024-10-23 ENCOUNTER — Encounter (HOSPITAL_COMMUNITY): Payer: Self-pay

## 2024-10-23 ENCOUNTER — Encounter: Payer: Self-pay | Admitting: Internal Medicine

## 2024-10-23 MED ORDER — TRELEGY ELLIPTA 100-62.5-25 MCG/ACT IN AEPB
1.0000 | INHALATION_SPRAY | Freq: Every day | RESPIRATORY_TRACT | 3 refills | Status: AC
  Filled 2024-10-23: qty 180, 90d supply, fill #0

## 2024-11-06 ENCOUNTER — Inpatient Hospital Stay: Attending: Internal Medicine

## 2024-11-06 ENCOUNTER — Inpatient Hospital Stay

## 2024-11-06 ENCOUNTER — Inpatient Hospital Stay: Admitting: Physician Assistant
